# Patient Record
Sex: Female | Born: 1955 | Race: Black or African American | Hispanic: No | Marital: Single | Smoking: Former smoker
Health system: Southern US, Community
[De-identification: ages and names within clinical notes are randomized; demographics above are authoritative.]

## PROBLEM LIST (undated history)

## (undated) DIAGNOSIS — Z9889 Other specified postprocedural states: Secondary | ICD-10-CM

## (undated) DIAGNOSIS — E119 Type 2 diabetes mellitus without complications: Secondary | ICD-10-CM

## (undated) DIAGNOSIS — G709 Myoneural disorder, unspecified: Secondary | ICD-10-CM

## (undated) DIAGNOSIS — I10 Essential (primary) hypertension: Secondary | ICD-10-CM

## (undated) DIAGNOSIS — D131 Benign neoplasm of stomach: Secondary | ICD-10-CM

## (undated) DIAGNOSIS — H409 Unspecified glaucoma: Secondary | ICD-10-CM

## (undated) DIAGNOSIS — R5383 Other fatigue: Secondary | ICD-10-CM

## (undated) DIAGNOSIS — Z8489 Family history of other specified conditions: Secondary | ICD-10-CM

## (undated) DIAGNOSIS — R7303 Prediabetes: Secondary | ICD-10-CM

## (undated) DIAGNOSIS — M2550 Pain in unspecified joint: Secondary | ICD-10-CM

## (undated) DIAGNOSIS — T7840XA Allergy, unspecified, initial encounter: Secondary | ICD-10-CM

## (undated) DIAGNOSIS — R011 Cardiac murmur, unspecified: Secondary | ICD-10-CM

## (undated) DIAGNOSIS — M1990 Unspecified osteoarthritis, unspecified site: Secondary | ICD-10-CM

## (undated) DIAGNOSIS — K209 Esophagitis, unspecified: Secondary | ICD-10-CM

## (undated) DIAGNOSIS — K5900 Constipation, unspecified: Secondary | ICD-10-CM

## (undated) DIAGNOSIS — J189 Pneumonia, unspecified organism: Secondary | ICD-10-CM

## (undated) DIAGNOSIS — H269 Unspecified cataract: Secondary | ICD-10-CM

## (undated) DIAGNOSIS — M549 Dorsalgia, unspecified: Secondary | ICD-10-CM

## (undated) DIAGNOSIS — K760 Fatty (change of) liver, not elsewhere classified: Secondary | ICD-10-CM

## (undated) DIAGNOSIS — K219 Gastro-esophageal reflux disease without esophagitis: Secondary | ICD-10-CM

## (undated) DIAGNOSIS — M797 Fibromyalgia: Secondary | ICD-10-CM

## (undated) DIAGNOSIS — K829 Disease of gallbladder, unspecified: Secondary | ICD-10-CM

## (undated) DIAGNOSIS — E785 Hyperlipidemia, unspecified: Secondary | ICD-10-CM

## (undated) DIAGNOSIS — K589 Irritable bowel syndrome without diarrhea: Secondary | ICD-10-CM

## (undated) HISTORY — DX: Prediabetes: R73.03

## (undated) HISTORY — DX: Pain in unspecified joint: M25.50

## (undated) HISTORY — DX: Disease of gallbladder, unspecified: K82.9

## (undated) HISTORY — DX: Constipation, unspecified: K59.00

## (undated) HISTORY — PX: EYE SURGERY: SHX253

## (undated) HISTORY — DX: Unspecified osteoarthritis, unspecified site: M19.90

## (undated) HISTORY — DX: Type 2 diabetes mellitus without complications: E11.9

## (undated) HISTORY — DX: Other specified postprocedural states: Z98.890

## (undated) HISTORY — DX: Benign neoplasm of stomach: D13.1

## (undated) HISTORY — DX: Cardiac murmur, unspecified: R01.1

## (undated) HISTORY — DX: Hyperlipidemia, unspecified: E78.5

## (undated) HISTORY — PX: VAGINAL HYSTERECTOMY: SUR661

## (undated) HISTORY — DX: Essential (primary) hypertension: I10

## (undated) HISTORY — DX: Fatty (change of) liver, not elsewhere classified: K76.0

## (undated) HISTORY — DX: Esophagitis, unspecified: K20.9

## (undated) HISTORY — DX: Unspecified cataract: H26.9

## (undated) HISTORY — DX: Irritable bowel syndrome, unspecified: K58.9

## (undated) HISTORY — DX: Unspecified glaucoma: H40.9

## (undated) HISTORY — DX: Other fatigue: R53.83

## (undated) HISTORY — DX: Allergy, unspecified, initial encounter: T78.40XA

## (undated) HISTORY — PX: COLONOSCOPY: SHX174

## (undated) HISTORY — DX: Esophagitis, unspecified without bleeding: K20.90

## (undated) HISTORY — DX: Myoneural disorder, unspecified: G70.9

## (undated) HISTORY — PX: LAPAROSCOPY: SHX197

## (undated) HISTORY — DX: Dorsalgia, unspecified: M54.9

## (undated) HISTORY — DX: Fibromyalgia: M79.7

## (undated) HISTORY — PX: UPPER GASTROINTESTINAL ENDOSCOPY: SHX188

## (undated) HISTORY — DX: Gastro-esophageal reflux disease without esophagitis: K21.9

---

## 1998-07-11 ENCOUNTER — Other Ambulatory Visit: Admission: RE | Admit: 1998-07-11 | Discharge: 1998-07-11 | Payer: Self-pay | Admitting: *Deleted

## 2000-04-16 ENCOUNTER — Other Ambulatory Visit: Admission: RE | Admit: 2000-04-16 | Discharge: 2000-04-16 | Payer: Self-pay | Admitting: Obstetrics and Gynecology

## 2001-02-11 ENCOUNTER — Encounter: Admission: RE | Admit: 2001-02-11 | Discharge: 2001-02-11 | Payer: Self-pay | Admitting: Internal Medicine

## 2001-02-11 ENCOUNTER — Encounter: Payer: Self-pay | Admitting: Internal Medicine

## 2001-03-04 ENCOUNTER — Other Ambulatory Visit: Admission: RE | Admit: 2001-03-04 | Discharge: 2001-03-04 | Payer: Self-pay | Admitting: *Deleted

## 2002-05-25 ENCOUNTER — Other Ambulatory Visit: Admission: RE | Admit: 2002-05-25 | Discharge: 2002-05-25 | Payer: Self-pay | Admitting: *Deleted

## 2003-08-31 ENCOUNTER — Other Ambulatory Visit: Admission: RE | Admit: 2003-08-31 | Discharge: 2003-08-31 | Payer: Self-pay | Admitting: Obstetrics and Gynecology

## 2004-01-10 ENCOUNTER — Observation Stay (HOSPITAL_COMMUNITY): Admission: EM | Admit: 2004-01-10 | Discharge: 2004-01-12 | Payer: Self-pay | Admitting: Emergency Medicine

## 2004-07-13 ENCOUNTER — Emergency Department (HOSPITAL_COMMUNITY): Admission: EM | Admit: 2004-07-13 | Discharge: 2004-07-13 | Payer: Self-pay | Admitting: *Deleted

## 2004-09-18 ENCOUNTER — Other Ambulatory Visit: Admission: RE | Admit: 2004-09-18 | Discharge: 2004-09-18 | Payer: Self-pay | Admitting: Obstetrics and Gynecology

## 2006-12-08 ENCOUNTER — Ambulatory Visit: Payer: Self-pay

## 2007-01-11 ENCOUNTER — Emergency Department (HOSPITAL_COMMUNITY): Admission: EM | Admit: 2007-01-11 | Discharge: 2007-01-12 | Payer: Self-pay | Admitting: Emergency Medicine

## 2007-01-15 ENCOUNTER — Encounter: Admission: RE | Admit: 2007-01-15 | Discharge: 2007-01-15 | Payer: Self-pay | Admitting: Internal Medicine

## 2007-01-15 ENCOUNTER — Encounter (INDEPENDENT_AMBULATORY_CARE_PROVIDER_SITE_OTHER): Payer: Self-pay | Admitting: *Deleted

## 2007-01-16 ENCOUNTER — Emergency Department (HOSPITAL_COMMUNITY): Admission: EM | Admit: 2007-01-16 | Discharge: 2007-01-16 | Payer: Self-pay | Admitting: Emergency Medicine

## 2007-01-19 ENCOUNTER — Encounter: Payer: Self-pay | Admitting: Gastroenterology

## 2007-01-22 ENCOUNTER — Encounter (INDEPENDENT_AMBULATORY_CARE_PROVIDER_SITE_OTHER): Payer: Self-pay | Admitting: *Deleted

## 2007-01-22 ENCOUNTER — Encounter: Admission: RE | Admit: 2007-01-22 | Discharge: 2007-01-22 | Payer: Self-pay | Admitting: Gastroenterology

## 2007-02-06 ENCOUNTER — Ambulatory Visit (HOSPITAL_COMMUNITY): Admission: RE | Admit: 2007-02-06 | Discharge: 2007-02-06 | Payer: Self-pay | Admitting: Obstetrics and Gynecology

## 2007-02-06 ENCOUNTER — Encounter (INDEPENDENT_AMBULATORY_CARE_PROVIDER_SITE_OTHER): Payer: Self-pay | Admitting: Obstetrics and Gynecology

## 2007-02-27 ENCOUNTER — Encounter: Admission: RE | Admit: 2007-02-27 | Discharge: 2007-02-27 | Payer: Self-pay | Admitting: Gastroenterology

## 2007-02-27 ENCOUNTER — Encounter (INDEPENDENT_AMBULATORY_CARE_PROVIDER_SITE_OTHER): Payer: Self-pay | Admitting: *Deleted

## 2007-03-10 ENCOUNTER — Encounter (INDEPENDENT_AMBULATORY_CARE_PROVIDER_SITE_OTHER): Payer: Self-pay | Admitting: Gastroenterology

## 2007-03-10 ENCOUNTER — Encounter: Payer: Self-pay | Admitting: Gastroenterology

## 2007-03-29 ENCOUNTER — Emergency Department (HOSPITAL_COMMUNITY): Admission: EM | Admit: 2007-03-29 | Discharge: 2007-03-29 | Payer: Self-pay | Admitting: Emergency Medicine

## 2007-05-26 ENCOUNTER — Encounter: Payer: Self-pay | Admitting: Gastroenterology

## 2007-06-04 HISTORY — PX: CHOLECYSTECTOMY: SHX55

## 2007-06-12 ENCOUNTER — Emergency Department (HOSPITAL_COMMUNITY): Admission: EM | Admit: 2007-06-12 | Discharge: 2007-06-12 | Payer: Self-pay | Admitting: Emergency Medicine

## 2007-07-02 ENCOUNTER — Encounter (INDEPENDENT_AMBULATORY_CARE_PROVIDER_SITE_OTHER): Payer: Self-pay | Admitting: General Surgery

## 2007-07-02 ENCOUNTER — Encounter (INDEPENDENT_AMBULATORY_CARE_PROVIDER_SITE_OTHER): Payer: Self-pay | Admitting: *Deleted

## 2007-07-02 ENCOUNTER — Ambulatory Visit (HOSPITAL_COMMUNITY): Admission: RE | Admit: 2007-07-02 | Discharge: 2007-07-03 | Payer: Self-pay | Admitting: General Surgery

## 2007-08-07 ENCOUNTER — Encounter: Payer: Self-pay | Admitting: Gastroenterology

## 2007-08-20 ENCOUNTER — Encounter: Payer: Self-pay | Admitting: Gastroenterology

## 2008-05-25 ENCOUNTER — Encounter: Payer: Self-pay | Admitting: Internal Medicine

## 2008-05-25 ENCOUNTER — Ambulatory Visit: Payer: Self-pay | Admitting: Internal Medicine

## 2008-05-25 DIAGNOSIS — D131 Benign neoplasm of stomach: Secondary | ICD-10-CM | POA: Insufficient documentation

## 2008-05-25 DIAGNOSIS — K219 Gastro-esophageal reflux disease without esophagitis: Secondary | ICD-10-CM | POA: Insufficient documentation

## 2008-05-25 DIAGNOSIS — E1129 Type 2 diabetes mellitus with other diabetic kidney complication: Secondary | ICD-10-CM | POA: Insufficient documentation

## 2008-05-25 DIAGNOSIS — M5412 Radiculopathy, cervical region: Secondary | ICD-10-CM | POA: Insufficient documentation

## 2008-05-25 DIAGNOSIS — E119 Type 2 diabetes mellitus without complications: Secondary | ICD-10-CM

## 2008-05-25 DIAGNOSIS — R209 Unspecified disturbances of skin sensation: Secondary | ICD-10-CM | POA: Insufficient documentation

## 2008-05-25 DIAGNOSIS — E785 Hyperlipidemia, unspecified: Secondary | ICD-10-CM | POA: Insufficient documentation

## 2008-05-25 DIAGNOSIS — K589 Irritable bowel syndrome without diarrhea: Secondary | ICD-10-CM | POA: Insufficient documentation

## 2008-05-25 DIAGNOSIS — R809 Proteinuria, unspecified: Secondary | ICD-10-CM | POA: Insufficient documentation

## 2008-05-25 LAB — CONVERTED CEMR LAB
Cholesterol, target level: 200 mg/dL
HDL goal, serum: 40 mg/dL
LDL Goal: 100 mg/dL
Vitamin B-12: 1356 pg/mL — ABNORMAL HIGH (ref 211–911)

## 2008-05-31 ENCOUNTER — Encounter: Admission: RE | Admit: 2008-05-31 | Discharge: 2008-05-31 | Payer: Self-pay | Admitting: Internal Medicine

## 2008-06-06 ENCOUNTER — Encounter: Payer: Self-pay | Admitting: Internal Medicine

## 2008-08-09 ENCOUNTER — Ambulatory Visit: Payer: Self-pay | Admitting: Internal Medicine

## 2008-08-09 LAB — CONVERTED CEMR LAB
Anti Nuclear Antibody(ANA): NEGATIVE
Basophils Relative: 0.3 % (ref 0.0–3.0)
CO2: 32 meq/L (ref 19–32)
Chloride: 100 meq/L (ref 96–112)
Creatinine,U: 147.9 mg/dL
Crystals: NEGATIVE
Eosinophils Absolute: 0.1 10*3/uL (ref 0.0–0.7)
Eosinophils Relative: 2 % (ref 0.0–5.0)
HCT: 36.6 % (ref 36.0–46.0)
Hgb A1c MFr Bld: 6.1 % — ABNORMAL HIGH (ref 4.6–6.0)
Leukocytes, UA: NEGATIVE
Microalb Creat Ratio: 10.8 mg/g (ref 0.0–30.0)
Neutro Abs: 1.4 10*3/uL (ref 1.4–7.7)
Nitrite: NEGATIVE
Potassium: 3.5 meq/L (ref 3.5–5.1)
RBC: 4.26 M/uL (ref 3.87–5.11)
Sed Rate: 19 mm/hr (ref 0–22)
Sodium: 142 meq/L (ref 135–145)
TSH: 0.62 microintl units/mL (ref 0.35–5.50)
Total Bilirubin: 0.8 mg/dL (ref 0.3–1.2)
Urine Glucose: NEGATIVE mg/dL
pH: 6 (ref 5.0–8.0)

## 2008-08-10 ENCOUNTER — Encounter: Payer: Self-pay | Admitting: Internal Medicine

## 2008-10-25 ENCOUNTER — Ambulatory Visit: Payer: Self-pay | Admitting: Gastroenterology

## 2008-10-25 DIAGNOSIS — M797 Fibromyalgia: Secondary | ICD-10-CM | POA: Insufficient documentation

## 2008-10-25 DIAGNOSIS — K7581 Nonalcoholic steatohepatitis (NASH): Secondary | ICD-10-CM | POA: Insufficient documentation

## 2008-11-08 ENCOUNTER — Telehealth: Payer: Self-pay | Admitting: Internal Medicine

## 2008-11-08 LAB — HM COLONOSCOPY

## 2008-11-28 ENCOUNTER — Ambulatory Visit: Payer: Self-pay | Admitting: Gastroenterology

## 2008-11-28 DIAGNOSIS — R143 Flatulence: Secondary | ICD-10-CM

## 2008-11-28 DIAGNOSIS — R142 Eructation: Secondary | ICD-10-CM

## 2008-11-28 DIAGNOSIS — R141 Gas pain: Secondary | ICD-10-CM | POA: Insufficient documentation

## 2009-02-10 ENCOUNTER — Ambulatory Visit: Payer: Self-pay | Admitting: Internal Medicine

## 2009-02-10 DIAGNOSIS — K117 Disturbances of salivary secretion: Secondary | ICD-10-CM | POA: Insufficient documentation

## 2009-02-10 LAB — CONVERTED CEMR LAB
Basophils Absolute: 0 10*3/uL (ref 0.0–0.1)
Basophils Relative: 0.3 % (ref 0.0–3.0)
Calcium: 9.3 mg/dL (ref 8.4–10.5)
Eosinophils Absolute: 0.1 10*3/uL (ref 0.0–0.7)
Eosinophils Relative: 1.8 % (ref 0.0–5.0)
GFR calc non Af Amer: 96.06 mL/min (ref >60)
Lymphocytes Relative: 50.1 % — ABNORMAL HIGH (ref 12.0–46.0)
MCHC: 34.8 g/dL (ref 30.0–36.0)
MCV: 86.5 fL (ref 78.0–100.0)
Neutro Abs: 1.5 10*3/uL (ref 1.4–7.7)
Potassium: 3.7 meq/L (ref 3.5–5.1)
RDW: 14.7 % — ABNORMAL HIGH (ref 11.5–14.6)
Sed Rate: 27 mm/hr — ABNORMAL HIGH (ref 0–22)
Sodium: 143 meq/L (ref 135–145)
Specific Gravity, Urine: 1.02 (ref 1.000–1.030)
TSH: 0.79 microintl units/mL (ref 0.35–5.50)
Urine Glucose: NEGATIVE mg/dL
WBC: 4.3 10*3/uL — ABNORMAL LOW (ref 4.5–10.5)
pH: 6 (ref 5.0–8.0)

## 2009-03-23 ENCOUNTER — Encounter: Payer: Self-pay | Admitting: Internal Medicine

## 2009-03-23 ENCOUNTER — Ambulatory Visit: Payer: Self-pay | Admitting: Internal Medicine

## 2009-03-24 ENCOUNTER — Telehealth: Payer: Self-pay | Admitting: Internal Medicine

## 2009-06-14 ENCOUNTER — Telehealth: Payer: Self-pay | Admitting: Internal Medicine

## 2009-06-16 ENCOUNTER — Ambulatory Visit: Payer: Self-pay | Admitting: Internal Medicine

## 2009-06-16 ENCOUNTER — Telehealth: Payer: Self-pay | Admitting: Internal Medicine

## 2009-06-21 ENCOUNTER — Encounter: Payer: Self-pay | Admitting: Internal Medicine

## 2009-06-21 ENCOUNTER — Telehealth: Payer: Self-pay | Admitting: Internal Medicine

## 2009-08-02 ENCOUNTER — Telehealth: Payer: Self-pay | Admitting: Internal Medicine

## 2009-08-03 ENCOUNTER — Encounter: Payer: Self-pay | Admitting: Internal Medicine

## 2009-08-03 ENCOUNTER — Ambulatory Visit: Payer: Self-pay | Admitting: Internal Medicine

## 2009-08-03 DIAGNOSIS — R9431 Abnormal electrocardiogram [ECG] [EKG]: Secondary | ICD-10-CM | POA: Insufficient documentation

## 2009-08-03 DIAGNOSIS — M549 Dorsalgia, unspecified: Secondary | ICD-10-CM | POA: Insufficient documentation

## 2009-08-03 DIAGNOSIS — I10 Essential (primary) hypertension: Secondary | ICD-10-CM | POA: Insufficient documentation

## 2009-08-09 ENCOUNTER — Ambulatory Visit: Payer: Self-pay | Admitting: Internal Medicine

## 2009-08-09 DIAGNOSIS — IMO0002 Reserved for concepts with insufficient information to code with codable children: Secondary | ICD-10-CM | POA: Insufficient documentation

## 2009-08-14 ENCOUNTER — Telehealth (INDEPENDENT_AMBULATORY_CARE_PROVIDER_SITE_OTHER): Payer: Self-pay | Admitting: *Deleted

## 2009-08-15 ENCOUNTER — Encounter (HOSPITAL_COMMUNITY): Admission: RE | Admit: 2009-08-15 | Discharge: 2009-10-04 | Payer: Self-pay | Admitting: Internal Medicine

## 2009-08-15 ENCOUNTER — Ambulatory Visit: Payer: Self-pay

## 2009-08-15 ENCOUNTER — Ambulatory Visit: Payer: Self-pay | Admitting: Cardiology

## 2009-08-15 ENCOUNTER — Telehealth: Payer: Self-pay | Admitting: Internal Medicine

## 2009-08-23 ENCOUNTER — Ambulatory Visit (HOSPITAL_COMMUNITY): Admission: RE | Admit: 2009-08-23 | Discharge: 2009-08-23 | Payer: Self-pay | Admitting: Internal Medicine

## 2009-08-24 ENCOUNTER — Telehealth: Payer: Self-pay | Admitting: Internal Medicine

## 2009-08-24 ENCOUNTER — Encounter: Payer: Self-pay | Admitting: Internal Medicine

## 2009-10-11 ENCOUNTER — Encounter
Admission: RE | Admit: 2009-10-11 | Discharge: 2010-01-03 | Payer: Self-pay | Admitting: Physical Medicine & Rehabilitation

## 2009-10-11 ENCOUNTER — Ambulatory Visit: Payer: Self-pay | Admitting: Internal Medicine

## 2009-10-11 LAB — CONVERTED CEMR LAB
BUN: 15 mg/dL (ref 6–23)
CO2: 33 meq/L — ABNORMAL HIGH (ref 19–32)
Calcium: 9.7 mg/dL (ref 8.4–10.5)
Chloride: 102 meq/L (ref 96–112)
Glucose, Bld: 122 mg/dL — ABNORMAL HIGH (ref 70–99)

## 2009-10-13 ENCOUNTER — Ambulatory Visit: Payer: Self-pay | Admitting: Physical Medicine & Rehabilitation

## 2009-10-16 ENCOUNTER — Ambulatory Visit: Payer: Self-pay | Admitting: Physical Medicine & Rehabilitation

## 2009-10-27 ENCOUNTER — Ambulatory Visit: Payer: Self-pay | Admitting: Physical Medicine & Rehabilitation

## 2009-11-30 ENCOUNTER — Ambulatory Visit: Payer: Self-pay | Admitting: Physical Medicine & Rehabilitation

## 2009-12-28 ENCOUNTER — Ambulatory Visit: Payer: Self-pay | Admitting: Physical Medicine & Rehabilitation

## 2009-12-29 ENCOUNTER — Ambulatory Visit (HOSPITAL_COMMUNITY)
Admission: RE | Admit: 2009-12-29 | Discharge: 2009-12-29 | Payer: Self-pay | Admitting: Physical Medicine & Rehabilitation

## 2010-01-03 ENCOUNTER — Encounter
Admission: RE | Admit: 2010-01-03 | Discharge: 2010-04-03 | Payer: Self-pay | Admitting: Physical Medicine & Rehabilitation

## 2010-02-19 ENCOUNTER — Ambulatory Visit: Payer: Self-pay | Admitting: Physical Medicine & Rehabilitation

## 2010-05-11 ENCOUNTER — Encounter
Admission: RE | Admit: 2010-05-11 | Discharge: 2010-05-11 | Payer: Self-pay | Source: Home / Self Care | Attending: Emergency Medicine | Admitting: Emergency Medicine

## 2010-05-15 ENCOUNTER — Encounter
Admission: RE | Admit: 2010-05-15 | Discharge: 2010-07-03 | Payer: Self-pay | Source: Home / Self Care | Attending: Physical Medicine & Rehabilitation | Admitting: Physical Medicine & Rehabilitation

## 2010-05-21 ENCOUNTER — Ambulatory Visit: Payer: Self-pay | Admitting: Physical Medicine & Rehabilitation

## 2010-07-03 NOTE — Assessment & Plan Note (Signed)
Summary: Cardiology Nuclear Study  Nuclear Med Background Indications for Stress Test: Evaluation for Ischemia, Abnormal EKG   History: Myocardial Perfusion Study  History Comments: 07/06 JYN:WGNFAO, EF=70%  Symptoms: Chest Tightness, Diaphoresis, Dizziness, DOE, Fatigue, Near Syncope, Palpitations  Symptoms Comments: Last episode of ZH:YQMV night.   Nuclear Pre-Procedure Cardiac Risk Factors: Family History - CAD, History of Smoking, Lipids, NIDDM, Obesity Caffeine/Decaff Intake: None NPO After: 8:00 PM Lungs: Clear.  O2 Sat 99% on RA. IV 0.9% NS with Angio Cath: 22g     IV Site: (R) AC IV Started by: Irean Hong RN Chest Size (in) 40     Cup Size C     Height (in): 68 Weight (lb): 234 BMI: 35.71  Nuclear Med Study 1 or 2 day study:  1 day     Stress Test Type:  Eugenie Birks Reading MD:  Marca Ancona, MD     Referring MD:  Sanda Linger, MD Resting Radionuclide:  Technetium 64m Tetrofosmin     Resting Radionuclide Dose:  11.0 mCi  Stress Radionuclide:  Technetium 65m Tetrofosmin     Stress Radionuclide Dose:  33.0 mCi   Stress Protocol   Lexiscan: 0.4 mg   Stress Test Technologist:  Rea College CMA-N     Nuclear Technologist:  Domenic Polite CNMT  Rest Procedure  Myocardial perfusion imaging was performed at rest 45 minutes following the intravenous administration of Myoview Technetium 78m Tetrofosmin.  Stress Procedure  The patient received IV Lexiscan 0.4 mg over 15-seconds.  Myoview injected at 30-seconds.  There were diffuse nonspecific T-wave changes with lexiscan.  Quantitative spect images were obtained after a 45 minute delay.  QPS Raw Data Images:  Normal; no motion artifact; normal heart/lung ratio. Stress Images:  Small, mild apical anterior perfusion defect.  Rest Images:  Normal homogeneous uptake in all areas of the myocardium. Subtraction (SDS):  Small reversible apical anterior defect.  Transient Ischemic Dilatation:  .90  (Normal <1.22)  Lung/Heart  Ratio:  .27  (Normal <0.45)  Quantitative Gated Spect Images QGS EDV:  71 ml QGS ESV:  22 ml QGS EF:  69 % QGS cine images:  Normal wall motion.    Overall Impression  Exercise Capacity: Lexiscan study BP Response: Normal blood pressure response. Clinical Symptoms: Shortness of breath ECG Impression: T wave inversions with Lexiscan infusion.  Overall Impression: Small reversible apical anterior perfusion defect.  This could represent shifting breast artifact but cannot rule out a small area of ischemia.  Overall Impression Comments: Overall low risk study.

## 2010-07-03 NOTE — Progress Notes (Signed)
Summary: [facility] PA  Phone Note From Pharmacy   Caller: [facility]  [address] [phone]* Call For: [phone] ID:  [phone]  Summary of Call: PA request--[facility]. Called [employer] to inquire and was told to call [employer] [place]. Form requested, and alternative list printed for MD. Initial call taken by: Lucious Groves,  June 16, 2009 9:37 AM  Follow-up for Phone Call        done Follow-up by: Etta Grandchild MD,  June 18, 2009 5:30 PM

## 2010-07-03 NOTE — Assessment & Plan Note (Signed)
Summary: PER KELLY SCHED--STC   Vital Signs:  Patient profile:   55 year old female Height:      68 inches Weight:      241 pounds BMI:     36.78 O2 Sat:      96 % on Room air Temp:     98.8 degrees F oral Pulse rhythm:   regular Resp:     16 per minute BP sitting:   128 / 84 Cuff size:   large  Vitals Entered By: Rock Nephew CMA (August 03, 2009 10:22 AM)  Nutrition Counseling: Patient's BMI is greater than 25 and therefore counseled on weight management options.  O2 Flow:  Room air CC: Pt c/o pain in both legs from hips down. Pt states the pain is constant and medication isn't helping. Pt also states there is some swelling./aj, Hypertension Management, Back Pain, Lipid Management   Primary Care Provider:  Sanda Linger, MD  CC:  Pt c/o pain in both legs from hips down. Pt states the pain is constant and medication isn't helping. Pt also states there is some swelling./aj, Hypertension Management, Back Pain, and Lipid Management.  History of Present Illness: She returns c/o persistent LBP that goes into both legs R>L. The pain on the right side goes down to her knee. She has stopped Crestor and symptoms persist. Recent meds have not been helpful.  Also, she complains of edema and SOB since she stopped hctz.  Back Pain History:      The patient's back pain started approximately 03/16/2009.  The pain is located in the lower back region and does radiate below the knees.  She states this is not work related.  On a scale of 1-10, she describes the pain as a 3.  She states that she has no prior history of back pain.  The patient has not had any recent physical therapy for her back pain.  The following makes the back pain better: rest.  The following makes the back pain worse: movement, bending, .        Description of injury in patient's own words:  n/a.    Critical Exclusionary Diagnosis Criteria (CEDC) for Back Pain:      The patient denies a history of previous trauma.  She has no  prior history of spinal surgery.  There are no symptoms to suggest infection, cauda equina, or psychosocial factors for back pain.  Cancer risk factors include age >50 yrs with new back pain and no improvement in low back pain after 4-6 weeks therapy.  Other positive CEDC factors include low back pain worse with activity.    Hypertension History:      She complains of dyspnea with exertion and peripheral edema, but denies headache, chest pain, palpitations, orthopnea, PND, visual symptoms, neurologic problems, and syncope.        Positive major cardiovascular risk factors include diabetes, hyperlipidemia, hypertension, and family history for ischemic heart disease (males less than 31 years old).  Negative major cardiovascular risk factors include female age less than 28 years old and non-tobacco-user status.        Further assessment for target organ damage reveals no history of ASHD, cardiac end-organ damage (CHF/LVH), stroke/TIA, peripheral vascular disease, renal insufficiency, or hypertensive retinopathy.    Lipid Management History:      Positive NCEP/ATP III risk factors include diabetes, family history for ischemic heart disease (males less than 54 years old), and hypertension.  Negative NCEP/ATP III risk factors include female  age less than 82 years old, no history of early menopause without estrogen hormone replacement, non-tobacco-user status, no ASHD (atherosclerotic heart disease), no prior stroke/TIA, no peripheral vascular disease, and no history of aortic aneurysm.        The patient states that she knows about the "Therapeutic Lifestyle Change" diet.  Her compliance with the TLC diet is fair.  The patient expresses understanding of adjunctive measures for cholesterol lowering.  Adjunctive measures started by the patient include fiber and limit alcohol consumpton.  She expresses no side effects from her lipid-lowering medication.  The patient denies any symptoms to suggest myopathy or liver  disease.      Current Medications (verified): 1)  Crestor 20 Mg Tabs (Rosuvastatin Calcium) .... Take 1/2 Tablet By Mouth Once Daily 2)  Multivitamins  Tabs (Multiple Vitamin) .... Take 1 Tablet By Mouth Once A Day 3)  Vitamin E 1000 Unit Caps (Vitamin E) .... Take 1 Capsule By Mouth Once Daily 4)  Levbid 0.375 Mg Xr12h-Tab (Hyoscyamine Sulfate) .... One Tablet By Mouth Two Times A Day 5)  Robaxin 500 Mg Tabs (Methocarbamol) .Marland Kitchen.. 1 By Mouth Three Times A Day As Needed For Muscle Spasms 6)  Naprosyn 375 Mg Tabs (Naproxen) .... One By Mouth Two Times A Day For Pain 7)  Prilosec 40 Mg Cpdr (Omeprazole) .... One By Mouth Once Daily  Allergies (verified): No Known Drug Allergies  Past History:  Past Medical History: Reviewed history from 10/25/2008 and no changes required. GERD Hyperlipidemia Hyperthyroidism Benign fundic gland polyps IBS Arthritis Fibromyalgia Fatty liver  Past Surgical History: Reviewed history from 11/28/2008 and no changes required. Cholecystectomy 2009 (low GB EF, no gallstones) Hysterectomy Laparascopy for adhesions  Family History: Reviewed history from 11/28/2008 and no changes required. Family History of Colon CA 1st degree relative <60 Father Family History Hypertension Family History of Heart Disease: Mother Family History of Diabetes: Half Brother  Social History: Reviewed history from 11/28/2008 and no changes required. 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 -stopped 28 years ago Daily Caffeine Use-none  Review of Systems       The patient complains of weight gain, dyspnea on exertion, and peripheral edema.  The patient denies anorexia, fever, weight loss, chest pain, syncope, prolonged cough, headaches, hemoptysis, abdominal pain, melena, hematochezia, severe indigestion/heartburn, hematuria, incontinence, muscle weakness, suspicious skin lesions,  depression, abnormal bleeding, enlarged lymph nodes, and angioedema.    Physical Exam  General:  Well developed, well nourished, no acute distress.overweight-appearing.   Head:  normocephalic and atraumatic.   Mouth:  Oral mucosa and oropharynx without lesions or exudates.  Teeth in good repair. oropharynx pink and moist, no lesions, no aphthous ulcers, no erosions, and no tongue abnormalities.   Neck:  No deformities, masses, or tenderness noted. Lungs:  Normal respiratory effort, chest expands symmetrically. Lungs are clear to auscultation, no crackles or wheezes. Heart:  Normal rate and regular rhythm. S1 and S2 normal without gallop, murmur, click, rub or other extra sounds. Abdomen:  Bowel sounds positive,abdomen soft and non-tender without masses, organomegaly or hernias noted. Msk:  normal ROM, no joint tenderness, no joint swelling, no joint warmth, no redness over joints, no joint deformities, no joint instability, and no crepitation.   Pulses:  R and L carotid,radial,femoral,dorsalis pedis and posterior tibial pulses are full and equal bilaterally Extremities:  1+ left pedal edema and 1+ right pedal edema.   Neurologic:  No cranial nerve  deficits noted. Station and gait are normal. Plantar reflexes are down-going bilaterally. DTRs are symmetrical throughout. Sensory, motor and coordinative functions appear intact. Skin:  Intact without suspicious lesions or rashes Cervical Nodes:  no anterior cervical adenopathy and no posterior cervical adenopathy.   Axillary Nodes:  no R axillary adenopathy and no L axillary adenopathy.   Psych:  Cognition and judgment appear intact. Alert and cooperative with normal attention span and concentration. No apparent delusions, illusions, hallucinations Additional Exam:  EKG shows NSR with flat T wave in V1 and I but no Q waves and no acute ST/T wave changes.  Low Back Pain Physical Exam:    Inspection-deformity:     No    Palpation-spinal tenderness:    No    Motor Exam/Strength:         Left Ankle Dorsiflexion (L5,L4):     normal       Left Great Toe Dorsiflexion (L5,L4):     normal       Left Heel Walk (L5,some L4):     normal       Left Single Squat & Rise-Quads (L4):   normal       Left Toe Walk-calf (S1):       normal       Right Ankle Dorsiflexion (L5,L4):     normal       Right Great Toe Dorsiflexion (L5,L4):       normal       Right Heel Walk (L5,some L4):     normal       Right Single Squat & Rise Quads (L4):   normal       Right Toe Walk-calf (S1):       normal    Sensory Exam/Pinprick:        Left Medial Foot (L4):   normal       Left Dorsal Foot (L5):   normal       Left Lateral Foot (S1):   normal       Right Medial Foot (L4):   normal       Right Dorsal Foot (L5):   normal       Right Lateral Foot (S1):   normal    Reflexes:        Left Knee Jerk (L4):     normal       Left Ankle Reflex (S1):   normal       Right Knee Jerk:     normal       Right Ankle Reflex (S1):   normal    Straight Leg Raise (SLR):       Left Straight Leg Raise (SLR):   negative       Right Straight Leg Raise (SLR):   negative  Diabetes Management Exam:    Foot Exam (with socks and/or shoes not present):       Sensory-Pinprick/Light touch:          Left medial foot (L-4): normal          Left dorsal foot (L-5): normal          Left lateral foot (S-1): normal          Right medial foot (L-4): normal          Right dorsal foot (L-5): normal          Right lateral foot (S-1): normal       Sensory-Monofilament:          Left foot:  normal          Right foot: normal       Inspection:          Left foot: normal          Right foot: normal       Nails:          Left foot: normal          Right foot: normal   Impression & Recommendations:  Problem # 1:  BACK PAIN (ICD-724.5) Assessment New will start with plain films and consider MRi since she does have a radicular pain. change meds as well. The following medications were removed from  the medication list:    Robaxin 500 Mg Tabs (Methocarbamol) .Marland Kitchen... 1 by mouth three times a day as needed for muscle spasms    Naprosyn 375 Mg Tabs (Naproxen) ..... One by mouth two times a day for pain Her updated medication list for this problem includes:    Nucynta 75 Mg Tabs (Tapentadol hcl) ..... One by mouth qid as needed for pain  Orders: T-Lumbar Spine Complete, 5 Views ([id number])  Problem # 2:  HYPERTENSION, BENIGN ESSENTIAL (ICD-401.1) Assessment: New  Her updated medication list for this problem includes:    Hydrochlorothiazide 25 Mg Tabs (Hydrochlorothiazide) ..... One by mouth qam  BP today: 128/84 Prior BP: 120/82 (06/16/2009)  Prior 10 Yr Risk Heart Disease: Not enough information (05/25/2008)  Labs Reviewed: K+: 3.7 (02/10/2009) Creat: : 0.8 (02/10/2009)     Problem # 3:  FIBROMYALGIA (ICD-729.1) Assessment: Deteriorated  The following medications were removed from the medication list:    Robaxin 500 Mg Tabs (Methocarbamol) .Marland Kitchen... 1 by mouth three times a day as needed for muscle spasms    Naprosyn 375 Mg Tabs (Naproxen) ..... One by mouth two times a day for pain Her updated medication list for this problem includes:    Nucynta 75 Mg Tabs (Tapentadol hcl) ..... One by mouth qid as needed for pain  Problem # 4:  HYPERLIPIDEMIA (ICD-272.4) Assessment: Deteriorated  The following medications were removed from the medication list:    Crestor 20 Mg Tabs (Rosuvastatin calcium) .Marland Kitchen... Take 1/2 tablet by mouth once daily  Labs Reviewed: SGOT: 16 (08/09/2008)   SGPT: 30 (08/09/2008)  Lipid Goals: Chol Goal: 200 (05/25/2008)   HDL Goal: 40 (05/25/2008)   LDL Goal: 100 (05/25/2008)   TG Goal: 150 (05/25/2008)  Prior 10 Yr Risk Heart Disease: Not enough information (05/25/2008)  Problem # 5:  DIABETES MELLITUS, TYPE II (ICD-250.00) Assessment: Unchanged  Labs Reviewed: Creat: 0.8 (02/10/2009)    Reviewed HgBA1c results: 6.1 (08/09/2008)  Problem # 6:   ELECTROCARDIOGRAM, ABNORMAL (ICD-794.31) Assessment: New in light of Fam. Hx. will order a cardiolite Orders: EKG w/ Interpretation (93000) Cardiolite (Cardiolite)  Complete Medication List: 1)  Multivitamins Tabs (Multiple vitamin) .... Take 1 tablet by mouth once a day 2)  Vitamin E 1000 Unit Caps (Vitamin e) .... Take 1 capsule by mouth once daily 3)  Levbid 0.375 Mg Xr12h-tab (Hyoscyamine sulfate) .... One tablet by mouth two times a day 4)  Prilosec 40 Mg Cpdr (Omeprazole) .... One by mouth once daily 5)  Nucynta 75 Mg Tabs (Tapentadol hcl) .... One by mouth qid as needed for pain 6)  Hydrochlorothiazide 25 Mg Tabs (Hydrochlorothiazide) .... One by mouth qam  Hypertension Assessment/Plan:      The patient's hypertensive risk group is category C: Target organ damage and/or diabetes.  Today's blood pressure is 128/84.  Her blood pressure goal is < 130/80.  Lipid Assessment/Plan:      Based on NCEP/ATP III, the patient's risk factor category is "history of diabetes".  The patient's lipid goals are as follows: Total cholesterol goal is 200; LDL cholesterol goal is 100; HDL cholesterol goal is 40; Triglyceride goal is 150.     Patient Instructions: 1)  Please schedule a follow-up appointment in 1 month. 2)  It is important that you exercise regularly at least 20 minutes 5 times a week. If you develop chest pain, have severe difficulty breathing, or feel very tired , stop exercising immediately and seek medical attention. 3)  You need to lose weight. Consider a lower calorie diet and regular exercise.  4)  Check your Blood Pressure regularly. If it is above 140/90: you should make an appointment. 5)  Take 650-1000mg  of Tylenol every 4-6 hours as needed for relief of pain or comfort of fever AVOID taking more than 4000mg   in a 24 hour period (can cause liver damage in higher doses). 6)  Most patients (90%) with low back pain will improve with time (2-6 weeks). Keep active but avoid  activities that are painful. Apply moist heat and/or ice to lower back several times a day. Prescriptions: HYDROCHLOROTHIAZIDE 25 MG TABS (HYDROCHLOROTHIAZIDE) One by mouth qam  #30 x 11   Entered and Authorized by:   Etta Grandchild MD   Signed by:   Etta Grandchild MD on 08/03/2009   Method used:   Print then Give to Patient   RxID:   [id number] NUCYNTA 75 MG TABS (TAPENTADOL HCL) One by mouth QID as needed for pain  #120 x 0   Entered and Authorized by:   Etta Grandchild MD   Signed by:   Etta Grandchild MD on 08/03/2009   Method used:   Print then Give to Patient   RxID:   [id number]

## 2010-07-03 NOTE — Medication Information (Signed)
Summary: Denial [facility] [facility]  [facility]ial [facility] [facility]   Imported By: Lester [place] 06/28/2009 16:32:03  _____________________________________________________________________  External Attachment:    Type:   Image     Comment:   External Document

## 2010-07-03 NOTE — Letter (Signed)
Summary: Results Follow-up Letter  [facility]  [address] [place], [place] [address]   Phone: [phone]  Fax: [phone]    08/24/2009  [address] [place], [place]  [address]  Dear [name],   The following are the results of your recent test(s):  Test     Result     MRI of L-spine   bulging discs, nerve damage, arthritis  _________________________________________________________  Please call for an appointment as directed _________________________________________________________ _________________________________________________________ _________________________________________________________  Sincerely,  Sanda Linger MD [facility]

## 2010-07-03 NOTE — Progress Notes (Signed)
Summary: med not workin  Phone Note Call from Patient Call back at [phone]   Summary of Call: Patient called stating that the meds given on 06/16/09 are not helping. Patient is still having pain, arthritis, and swelling in her legs. Some days are better than others, but it continues to be a recurring issue. Please advise. Initial call taken by: Lucious Groves,  August 02, 2009 2:06 PM  Follow-up for Phone Call        needs to be seen Follow-up by: Etta Grandchild MD,  August 02, 2009 2:15 PM  Additional Follow-up for Phone Call Additional follow up Details #1::        Patient notified. Additional Follow-up by: Lucious Groves,  August 02, 2009 3:55 PM

## 2010-07-03 NOTE — Assessment & Plan Note (Signed)
Summary: 2 MTH FU STC   Vital Signs:  Patient profile:   55 year old female Height:      68 inches Weight:      224.50 pounds BMI:     34.26 O2 Sat:      98 % Temp:     98.0 degrees F oral Pulse rate:   79 / minute Pulse rhythm:   regular Resp:     16 per minute BP sitting:   118 / 82  (left arm) Cuff size:   large  Vitals Entered By: Rock Nephew CMA (Oct 11, 2009 8:15 AM)  Nutrition Counseling: Patient's BMI is greater than 25 and therefore counseled on weight management options.  Primary Care Provider:  Sanda Linger, MD   History of Present Illness: She returns for f/up and has persistent LBP that radiates into her RLE. It has not recently changed or worsened and continues to affect her activity level. She sees pain management this Friday.  Back Pain History:      The patient's back pain has been present for > 6 weeks.  The pain is located in the lower back region and does radiate below the knees.  She states this is not work related.  On a scale of 1-10, she describes the pain as a 6.  She states that she has had a prior history of back pain.  The patient has not had any recent physical therapy for her back pain.  The following makes the back pain better: movement, bending.  The following makes the back pain worse: rest, meds.    Critical Exclusionary Diagnosis Criteria (CEDC) for Back Pain:      The patient denies a history of previous trauma.  She has no prior history of spinal surgery.  There are no symptoms to suggest infection, cancer, cauda equina, or psychosocial factors for back pain.     Current Medications (verified): 1)  Multivitamins  Tabs (Multiple Vitamin) .... Take 1 Tablet By Mouth Once A Day 2)  Vitamin E 1000 Unit Caps (Vitamin E) .... Take 1 Capsule By Mouth Once Daily 3)  Levbid 0.375 Mg Xr12h-Tab (Hyoscyamine Sulfate) .... One Tablet By Mouth Two Times A Day 4)  Prilosec 40 Mg Cpdr (Omeprazole) .... One By Mouth Once Daily 5)  Hydrochlorothiazide 25  Mg Tabs (Hydrochlorothiazide) .... One By Mouth Qam 6)  Lortab 7.5-500 Mg Tabs (Hydrocodone-Acetaminophen) .... One By Mouth Qid As Needed For Pain 7)  Doxycycline .... X 7 Days  Allergies (verified): No Known Drug Allergies  Past History:  Past Medical History: Reviewed history from 10/25/2008 and no changes required. GERD Hyperlipidemia Hyperthyroidism Benign fundic gland polyps IBS Arthritis Fibromyalgia Fatty liver  Past Surgical History: Reviewed history from 11/28/2008 and no changes required. Cholecystectomy 2009 (low GB EF, no gallstones) Hysterectomy Laparascopy for adhesions  Family History: Reviewed history from 11/28/2008 and no changes required. Family History of Colon CA 1st degree relative <60 Father Family History Hypertension Family History of Heart Disease: Mother Family History of Diabetes: Half Brother  Social History: Reviewed history from 11/28/2008 and no changes required. 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 -stopped 28 years ago Daily Caffeine Use-none  Review of Systems  The patient denies anorexia, fever, weight loss, weight gain, chest pain, syncope, dyspnea on exertion, peripheral edema, prolonged cough, abdominal pain, and hematuria.   Endo:  Denies cold intolerance, excessive hunger, excessive thirst, excessive urination, heat  intolerance, polyuria, and weight change.  Physical Exam  General:  Well developed, well nourished, no acute distress.overweight-appearing.   Mouth:  Oral mucosa and oropharynx without lesions or exudates.  Teeth in good repair. oropharynx pink and moist, no lesions, no aphthous ulcers, no erosions, and no tongue abnormalities.   Neck:  No deformities, masses, or tenderness noted. Lungs:  Normal respiratory effort, chest expands symmetrically. Lungs are clear to auscultation, no crackles or wheezes. Heart:  Normal rate  and regular rhythm. S1 and S2 normal without gallop, murmur, click, rub or other extra sounds. Abdomen:  Bowel sounds positive,abdomen soft and non-tender without masses, organomegaly or hernias noted. Msk:  normal ROM, no joint tenderness, no joint swelling, no joint warmth, no redness over joints, no joint deformities, no joint instability, and no crepitation.   Pulses:  R and L carotid,radial,femoral,dorsalis pedis and posterior tibial pulses are full and equal bilaterally Extremities:  1+ left pedal edema and 1+ right pedal edema.   Neurologic:  No cranial nerve deficits noted. Station and gait are normal. Plantar reflexes are down-going bilaterally. DTRs are symmetrical throughout. Sensory, motor and coordinative functions appear intact. Skin:  Intact without suspicious lesions or rashes Cervical Nodes:  no anterior cervical adenopathy and no posterior cervical adenopathy.   Psych:  Cognition and judgment appear intact. Alert and cooperative with normal attention span and concentration. No apparent delusions, illusions, hallucinations  Low Back Pain Physical Exam:    Inspection-deformity:     No    Palpation-spinal tenderness:   No    Motor Exam/Strength:         Left Ankle Dorsiflexion (L5,L4):     normal       Left Great Toe Dorsiflexion (L5,L4):     normal       Left Heel Walk (L5,some L4):     normal       Left Single Squat & Rise-Quads (L4):   normal       Left Toe Walk-calf (S1):       normal       Right Ankle Dorsiflexion (L5,L4):     normal       Right Great Toe Dorsiflexion (L5,L4):       normal       Right Heel Walk (L5,some L4):     normal       Right Single Squat & Rise Quads (L4):   normal       Right Toe Walk-calf (S1):       normal    Reflexes:        Left Knee Jerk (L4):     normal       Left Ankle Reflex (S1):   normal       Right Knee Jerk:     normal       Right Ankle Reflex (S1):   normal    Straight Leg Raise (SLR):       Left Straight Leg Raise (SLR):    negative       Right Straight Leg Raise (SLR):   negative  Diabetes Management Exam:    Foot Exam (with socks and/or shoes not present):       Sensory-Pinprick/Light touch:          Left medial foot (L-4): normal          Left dorsal foot (L-5): normal          Left lateral foot (S-1): normal  Right medial foot (L-4): normal          Right dorsal foot (L-5): normal          Right lateral foot (S-1): normal       Sensory-Monofilament:          Left foot: normal          Right foot: normal       Inspection:          Left foot: normal          Right foot: normal       Nails:          Left foot: normal          Right foot: normal   Impression & Recommendations:  Problem # 1:  BACK PAIN, LUMBAR, WITH RADICULOPATHY (ICD-724.4) Assessment Unchanged  Her updated medication list for this problem includes:    Lortab 7.5-500 Mg Tabs (Hydrocodone-acetaminophen) ..... One by mouth qid as needed for pain  Problem # 2:  HYPERTENSION, BENIGN ESSENTIAL (ICD-401.1) Assessment: Improved  Her updated medication list for this problem includes:    Hydrochlorothiazide 25 Mg Tabs (Hydrochlorothiazide) ..... One by mouth qam  Orders: Venipuncture (87564) TLB-BMP (Basic Metabolic Panel-BMET) (80048-METABOL) TLB-A1C / Hgb A1C (Glycohemoglobin) (83036-A1C)  BP today: 118/82 Prior BP: 126/90 (08/09/2009)  Prior 10 Yr Risk Heart Disease: Not enough information (05/25/2008)  Labs Reviewed: K+: 3.7 (02/10/2009) Creat: : 0.8 (02/10/2009)     Problem # 3:  DIABETES MELLITUS, TYPE II (ICD-250.00) Assessment: Unchanged  Orders: Venipuncture (33295) TLB-BMP (Basic Metabolic Panel-BMET) (80048-METABOL) TLB-A1C / Hgb A1C (Glycohemoglobin) (18841-Y6A) Ophthalmology Referral (Ophthalmology)  Labs Reviewed: Creat: 0.8 (02/10/2009)    Reviewed HgBA1c results: 6.1 (08/09/2008)  Complete Medication List: 1)  Multivitamins Tabs (Multiple vitamin) .... Take 1 tablet by mouth once a day 2)   Vitamin E 1000 Unit Caps (Vitamin e) .... Take 1 capsule by mouth once daily 3)  Levbid 0.375 Mg Xr12h-tab (Hyoscyamine sulfate) .... One tablet by mouth two times a day 4)  Prilosec 40 Mg Cpdr (Omeprazole) .... One by mouth once daily 5)  Hydrochlorothiazide 25 Mg Tabs (Hydrochlorothiazide) .... One by mouth qam 6)  Lortab 7.5-500 Mg Tabs (Hydrocodone-acetaminophen) .... One by mouth qid as needed for pain 7)  Doxycycline  .... X 7 days  Patient Instructions: 1)  Please schedule a follow-up appointment in 2 months. 2)  It is important that you exercise regularly at least 20 minutes 5 times a week. If you develop chest pain, have severe difficulty breathing, or feel very tired , stop exercising immediately and seek medical attention. 3)  You need to lose weight. Consider a lower calorie diet and regular exercise.  4)  Check your blood sugars regularly. If your readings are usually above 200 or below 70 you should contact our office. 5)  It is important that your Diabetic A1c level is checked every 3 months. 6)  See your eye doctor yearly to check for diabetic eye damage. 7)  Check your feet each night for sore areas, calluses or signs of infection. 8)  Check your Blood Pressure regularly. If it is above 130/80: you should make an appointment.  Preventive Care Screening  Pap Smear:    Date:  09/01/2008    Results:  normal

## 2010-07-03 NOTE — Letter (Signed)
Summary: Results Follow-up Letter  [facility]  [address] [place], [place] [address]   Phone: [phone]  Fax: [phone]    08/03/2009  [address] [place], [place]  [address]  Dear [name],   The following are the results of your recent test(s):  Test     Result     Low back Xray   arthritis and a "slipped disc"   _________________________________________________________  Please call for an appointment soon, we should discuss doing an MRI _________________________________________________________ _________________________________________________________ _________________________________________________________  Sincerely,  Sanda Linger MD [facility]

## 2010-07-03 NOTE — Progress Notes (Signed)
Summary: RESULTS  Phone Note Call from Patient   Summary of Call: Patient is requesting to be called with results of stress test when ready.  Initial call taken by: Lamar Sprinkles, CMA,  August 15, 2009 3:22 PM  Follow-up for Phone Call        normal Follow-up by: Etta Grandchild MD,  August 16, 2009 7:52 AM  Additional Follow-up for Phone Call Additional follow up Details #1::        Pt informed  Additional Follow-up by: Lamar Sprinkles, CMA,  August 17, 2009 9:31 AM

## 2010-07-03 NOTE — Progress Notes (Signed)
Summary: more infor req  Phone Note Other Incoming   Summary of Call: Rec'd note from PA dept notifying us that they need more information to process vimovo prior authorization. Please fax additional info to [phone]. I faxed last couple of office notes and x ray of patient knee. Will wait for reply. Initial call taken by: Lucious Groves,  June 21, 2009 9:39 AM  Follow-up for Phone Call        Letter received, and Vimovo has been denied. Any recommendations? Follow-up by: Lucious Groves,  June 22, 2009 1:48 PM  Additional Follow-up for Phone Call Additional follow up Details #1::        change to prilosec and naprosyn Additional Follow-up by: Etta Grandchild MD,  June 22, 2009 2:02 PM    New/Updated Medications: NAPROSYN 375 MG TABS (NAPROXEN) One by mouth two times a day for pain PRILOSEC 40 MG CPDR (OMEPRAZOLE) One by mouth once daily Prescriptions: PRILOSEC 40 MG CPDR (OMEPRAZOLE) One by mouth once daily  #30 x 11   Entered and Authorized by:   Etta Grandchild MD   Signed by:   Etta Grandchild MD on 06/22/2009   Method used:   Electronically to        [facility]  [address] [phone]* (retail)       [address]       [place], [place]  [id number]       Ph: [phone] or [phone]       Fax: [phone]   RxID:   [id number] NAPROSYN 375 MG TABS (NAPROXEN) One by mouth two times a day for pain  #60 x 2   Entered and Authorized by:   Etta Grandchild MD   Signed by:   Etta Grandchild MD on 06/22/2009   Method used:   [facility]ronically to        [facility]  [address] [phone]* (retail)       [address]       [place], [place]  [id number]       Ph: [phone] or [phone]       Fax: [phone]   RxID:   [id number]

## 2010-07-03 NOTE — Assessment & Plan Note (Signed)
Summary: follow up from x ray/ [facility]   Vital Signs:  Patient profile:   55 year old female Height:      68 inches Weight:      221 pounds BMI:     33.72 O2 Sat:      97 % on Room air Temp:     98.1 degrees F oral Pulse rate:   78 / minute Pulse rhythm:   regular Resp:     16 per minute BP sitting:   126 / 90  (left arm) Cuff size:   large  Vitals Entered By: Bill Salinas CMA (August 09, 2009 2:01 PM)  Nutrition Counseling: Patient's BMI is greater than 25 and therefore counseled on weight management options.  O2 Flow:  Room air CC: office visit to discuss mri/ ab, Back pain   Primary Care Provider:  Sanda Linger, MD  CC:  office visit to discuss mri/ ab and Back pain.  History of Present Illness:  Back Pain      This is a 55 year old woman who presents with Back pain.   It is unchanged from her previous visit.  The patient reports rest pain, but denies fever, chills, weakness, loss of sensation, fecal incontinence, urinary incontinence, urinary retention, dysuria, inability to work, and inability to care for self.  The pain is located in the mid low back.  The pain radiates to the right leg below the knee.  The pain is made worse by standing or walking.  The pain is made better by inactivity.  Risk factors for serious underlying conditions include duration of pain > 1 month and age >= 50 years.  Current meds are not helping. Her plain xray was positive for spondylosis and anterolisthesis.  Preventive Screening-Counseling & Management  Alcohol-Tobacco     Alcohol drinks/day: 0     Smoking Status: quit     Passive Smoke Exposure: no  Medications Prior to Update: 1)  Multivitamins  Tabs (Multiple Vitamin) .... Take 1 Tablet By Mouth Once A Day 2)  Vitamin E 1000 Unit Caps (Vitamin E) .... Take 1 Capsule By Mouth Once Daily 3)  Levbid 0.375 Mg Xr12h-Tab (Hyoscyamine Sulfate) .... One Tablet By Mouth Two Times A Day 4)  Prilosec 40 Mg Cpdr (Omeprazole) .... One By Mouth Once  Daily 5)  Nucynta 75 Mg Tabs (Tapentadol Hcl) .... One By Mouth Qid As Needed For Pain 6)  Hydrochlorothiazide 25 Mg Tabs (Hydrochlorothiazide) .... One By Mouth Qam  Current Medications (verified): 1)  Multivitamins  Tabs (Multiple Vitamin) .... Take 1 Tablet By Mouth Once A Day 2)  Vitamin E 1000 Unit Caps (Vitamin E) .... Take 1 Capsule By Mouth Once Daily 3)  Levbid 0.375 Mg Xr12h-Tab (Hyoscyamine Sulfate) .... One Tablet By Mouth Two Times A Day 4)  Prilosec 40 Mg Cpdr (Omeprazole) .... One By Mouth Once Daily 5)  Hydrochlorothiazide 25 Mg Tabs (Hydrochlorothiazide) .... One By Mouth Qam 6)  Lortab 7.5-500 Mg Tabs (Hydrocodone-Acetaminophen) .... One By Mouth Qid As Needed For Pain  Allergies (verified): No Known Drug Allergies  Past History:  Past Medical History: Reviewed history from 10/25/2008 and no changes required. GERD Hyperlipidemia Hyperthyroidism Benign fundic gland polyps IBS Arthritis Fibromyalgia Fatty liver  Past Surgical History: Reviewed history from 11/28/2008 and no changes required. Cholecystectomy 2009 (low GB EF, no gallstones) Hysterectomy Laparascopy for adhesions  Family History: Reviewed history from 11/28/2008 and no changes required. Family History of Colon CA 1st degree relative <60 Father  Family History Hypertension Family History of Heart Disease: Mother Family History of Diabetes: Half Brother  Social History: Reviewed history from 11/28/2008 and no changes required. 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 -stopped 28 years ago Daily Caffeine Use-none  Review of Systems       The patient complains of weight gain.  The patient denies anorexia, fever, chest pain, syncope, dyspnea on exertion, peripheral edema, prolonged cough, headaches, hemoptysis, abdominal pain, hematuria, incontinence, abnormal bleeding, and enlarged lymph nodes.     Physical Exam  General:  Well developed, well nourished, no acute distress.overweight-appearing.   Head:  normocephalic and atraumatic.   Mouth:  Oral mucosa and oropharynx without lesions or exudates.  Teeth in good repair. oropharynx pink and moist, no lesions, no aphthous ulcers, no erosions, and no tongue abnormalities.   Neck:  No deformities, masses, or tenderness noted. Lungs:  Normal respiratory effort, chest expands symmetrically. Lungs are clear to auscultation, no crackles or wheezes. Heart:  Normal rate and regular rhythm. S1 and S2 normal without gallop, murmur, click, rub or other extra sounds. Abdomen:  Bowel sounds positive,abdomen soft and non-tender without masses, organomegaly or hernias noted. Msk:  normal ROM, no joint tenderness, no joint swelling, no joint warmth, no redness over joints, no joint deformities, no joint instability, and no crepitation.   Pulses:  R and L carotid,radial,femoral,dorsalis pedis and posterior tibial pulses are full and equal bilaterally Extremities:  1+ left pedal edema and 1+ right pedal edema.   Neurologic:  No cranial nerve deficits noted. Station and gait are normal. Plantar reflexes are down-going bilaterally. DTRs are symmetrical throughout. Sensory, motor and coordinative functions appear intact.  Diabetes Management Exam:    Foot Exam (with socks and/or shoes not present):       Sensory-Pinprick/Light touch:          Left medial foot (L-4): normal          Left dorsal foot (L-5): normal          Left lateral foot (S-1): normal          Right medial foot (L-4): normal          Right dorsal foot (L-5): normal          Right lateral foot (S-1): normal       Sensory-Monofilament:          Left foot: normal          Right foot: normal       Inspection:          Left foot: normal          Right foot: normal       Nails:          Left foot: normal          Right foot: normal   Detailed Back/Spine Exam  General:    Well  developed, well nourished, no acute distress.overweight-appearing.    Gait:    Normal heel-toe gait pattern bilaterally.    Skin:    Intact, no scars, lesions, rashes, cafe'-au-lait spots, or bruising.    Lumbosacral Exam:  Inspection-deformity:    Normal Palpation-spinal tenderness:  Normal Range of Motion:    Forward Flexion:   85 degrees    Hyperextension:   25 degrees    Schober's:        >6 cm    Right Lateral Bend:   30  degrees    Left Lateral Bend:   25 degrees Squatting:  normal Lying Straight Leg Raise:    Right:  negative    Left:  negative Sitting Straight Leg Raise:    Right:  negative    Left:  negative Reverse Straight Leg Raise:    Right:  negative    Left:  negative Contralateral Straight Leg Raise:    Right:  negative    Left:  negative Sciatic Notch:    There is no sciatic notch tenderness. Toe Walking:    Right:  normal    Left:  normal Heel Walking:    Right:  normal    Left:  normal   Impression & Recommendations:  Problem # 1:  BACK PAIN, LUMBAR, WITH RADICULOPATHY (ICD-724.4) Assessment New  The following medications were removed from the medication list:    Nucynta 75 Mg Tabs (Tapentadol hcl) ..... One by mouth qid as needed for pain Her updated medication list for this problem includes:    Lortab 7.5-500 Mg Tabs (Hydrocodone-acetaminophen) ..... One by mouth qid as needed for pain  Orders: Pain Clinic Referral (Pain) Radiology Referral (Radiology)  Discussed use of moist heat or ice, modified activities, medications, and stretching/strengthening exercises. Back care instructions given. To be seen in 2 weeks if no improvement; sooner if worsening of symptoms.   Problem # 2:  HYPERTENSION, BENIGN ESSENTIAL (ICD-401.1) Assessment: Unchanged  Her updated medication list for this problem includes:    Hydrochlorothiazide 25 Mg Tabs (Hydrochlorothiazide) ..... One by mouth qam  BP today: 126/90 Prior BP: 128/84 (08/03/2009)  Prior 10  Yr Risk Heart Disease: Not enough information (05/25/2008)  Labs Reviewed: K+: 3.7 (02/10/2009) Creat: : 0.8 (02/10/2009)     Problem # 3:  BACK PAIN (ICD-724.5) Assessment: Unchanged  The following medications were removed from the medication list:    Nucynta 75 Mg Tabs (Tapentadol hcl) ..... One by mouth qid as needed for pain Her updated medication list for this problem includes:    Lortab 7.5-500 Mg Tabs (Hydrocodone-acetaminophen) ..... One by mouth qid as needed for pain  Complete Medication List: 1)  Multivitamins Tabs (Multiple vitamin) .... Take 1 tablet by mouth once a day 2)  Vitamin E 1000 Unit Caps (Vitamin e) .... Take 1 capsule by mouth once daily 3)  Levbid 0.375 Mg Xr12h-tab (Hyoscyamine sulfate) .... One tablet by mouth two times a day 4)  Prilosec 40 Mg Cpdr (Omeprazole) .... One by mouth once daily 5)  Hydrochlorothiazide 25 Mg Tabs (Hydrochlorothiazide) .... One by mouth qam 6)  Lortab 7.5-500 Mg Tabs (Hydrocodone-acetaminophen) .... One by mouth qid as needed for pain  Patient Instructions: 1)  Please schedule a follow-up appointment in 2 months. 2)  Most patients (90%) with low back pain will improve with time (2-6 weeks). Keep active but avoid activities that are painful. Apply moist heat and/or ice to lower back several times a day. Prescriptions: LORTAB 7.5-500 MG TABS (HYDROCODONE-ACETAMINOPHEN) One by mouth QID as needed for pain  #75 x 4   Entered and Authorized by:   Etta Grandchild MD   Signed by:   Etta Grandchild MD on 08/09/2009   Method used:   Print then Give to Patient   RxID:   [id number]

## 2010-07-03 NOTE — Progress Notes (Signed)
Summary: Nuclear Pre-Procedure  Phone Note Outgoing Call   Call placed by: Milana Na, EMT-P,  August 14, 2009 12:35 PM Summary of Call: Reviewed information on [employer] Information Sheet (see scanned document for further details).  Spoke with patient.     Nuclear Med Background Indications for Stress Test: Evaluation for Ischemia, Abnormal EKG   History: Myocardial Perfusion Study  History Comments: 07/06 MPS NL EF 70%     Nuclear Pre-Procedure Cardiac Risk Factors: Family History - CAD, History of Smoking, Lipids, NIDDM Height (in): 68  Nuclear Med Study Referring MD:  T.Jones

## 2010-07-03 NOTE — Assessment & Plan Note (Signed)
Summary: UNBEARABLE PAIN IN THE RIGHT LEG-LB   Vital Signs:  Patient profile:   55 year old female Height:      68 inches (172.72 cm) Weight:      239.8 pounds (109 kg) O2 Sat:      98 % on Room air Temp:     97.2 degrees F (36.22 degrees C) oral Pulse rate:   69 / minute BP sitting:   120 / 82  (left arm) Cuff size:   large  Vitals Entered By: Orlan Leavens (June 16, 2009 4:26 PM)  O2 Flow:  Room air CC: (R) Hip and leg pain x's 3 weeks Is Patient Diabetic? No Pain Assessment Patient in pain? yes     Location: (R) hip and leg Type: aching   Primary Care Provider:  Sanda Linger, MD  CC:  (R) Hip and leg pain x's 3 weeks.  History of Present Illness: here today with complaint of rght le pain. onset of symptoms was 3 weeks ago. course has been gradual sudden onset and now occurs in constant but waxing/waning pattern. problem precipitated by no injury or trauma symptom characterized as stiffness in lateral knee but now shooting pain from lateral side of hip symptom radiates to toes from side of right hip problem associated with numbness and burning sensation but not associated with swelling of knee, fever or falls. also chrinic low back pain which is slightly worse than usual symptoms improved by sitting but has not tried tylenol or any otc meds symptoms worsened with standing or lying on righht side of hip at night no prior hx of same symptoms.   Current Medications (verified): 1)  Vimovo 500-20 Mg Tbec (Naproxen-Esomeprazole) .... One By Mouth Two Times A Day For Knee Pain 2)  Crestor 20 Mg Tabs (Rosuvastatin Calcium) .... Take 1/2 Tablet By Mouth Once Daily 3)  Multivitamins  Tabs (Multiple Vitamin) .... Take 1 Tablet By Mouth Once A Day 4)  Vitamin E 1000 Unit Caps (Vitamin E) .... Take 1 Capsule By Mouth Once Daily 5)  Levbid 0.375 Mg Xr12h-Tab (Hyoscyamine Sulfate) .... One Tablet By Mouth Two Times A Day  Allergies (verified): No Known Drug Allergies  Past  History:  Past Medical History: Last updated: 10/25/2008 GERD Hyperlipidemia Hyperthyroidism Benign fundic gland polyps IBS Arthritis Fibromyalgia Fatty liver  Review of Systems       The patient complains of difficulty walking.  The patient denies fever, chest pain, headaches, muscle weakness, and suspicious skin lesions.    Physical Exam  General:  Well developed, well nourished, no acute distress.overweight-appearing.   Lungs:  Normal respiratory effort, chest expands symmetrically. Lungs are clear to auscultation, no crackles or wheezes. Heart:  Normal rate and regular rhythm. S1 and S2 normal without gallop, murmur, click, rub or other extra sounds. Msk:  back: full range of motion of lumbar spine. Nontender to palpation. Negative straight leg raise. Deep tendon reflexes symmetrically intact at Achilles and patella, negative clonus. Sensation intact throughout all dermatomes in bilateral lower extremities. Full strength to manual muscle testing in all major muscule groups including EHL, anterior tibialis, gastrocnemius, quadriceps, and iliopsoas. Able to heel and toe walk without difficulty and ambulates with a normal gait. tender to direct palp of right greater troch bursa   Impression & Recommendations:  Problem # 1:  LEG PAIN, RIGHT (ICD-729.5)  suspect current symptoms are combination of lumbar radiculopathy and greater troch bursitis will tx with pred pack and robaxin consider future imaging of l  spine if persisting symptoms unimproved by emperic tx- or injection of greater troch bursa plan for antiinflam tx and muscle relaxants explained to pt including poss se also demonstrated stretches  Orders: Prescription Created Electronically [phone])  Complete Medication List: 1)  Vimovo 500-20 Mg Tbec (Naproxen-esomeprazole) .... One by mouth two times a day for knee pain 2)  Crestor 20 Mg Tabs (Rosuvastatin calcium) .... Take 1/2 tablet by mouth once daily 3)   Multivitamins Tabs (Multiple vitamin) .... Take 1 tablet by mouth once a day 4)  Vitamin E 1000 Unit Caps (Vitamin e) .... Take 1 capsule by mouth once daily 5)  Levbid 0.375 Mg Xr12h-tab (Hyoscyamine sulfate) .... One tablet by mouth two times a day 6)  Prednisone (pak) 10 Mg Tabs (Prednisone) .... Take as directed x 6 days 7)  Robaxin 500 Mg Tabs (Methocarbamol) .Marland Kitchen.. 1 by mouth three times a day as needed for muscle spasms  Patient Instructions: 1)  it was good to see you today.  2)  wil treat your pain for inflammation with pred pack and muscle relaxants (robaxin) as discussed - your prescriptions have been electronically submitted to your pharmacy. Please take as directed. Contact our office if you believe you're having problems with the medication(s).  3)  Most patients (90%) with low back pain and leg pain will improve with time (4-8 weeks). Keep active but avoid activities that are painful. Apply moist heat and/or ice to lower back and side of right hip several times a day. 4)  if continued pain or worsening symptoms after completion of these medications, call for reevaluation Prescriptions: ROBAXIN 500 MG TABS (METHOCARBAMOL) 1 by mouth three times a day as needed for muscle spasms  #30 x 1   Entered and Authorized by:   Newt Lukes MD   Signed by:   Newt Lukes MD on 06/16/2009   Method used:   Electronically to        [facility]  [address] [phone]* (retail)       [address]       [place], [place]  [address]       Ph: [phone] or [phone]       Fax: [phone]   RxID:   [id number] PREDNISONE (PAK) 10 MG TABS (PREDNISONE) take as directed x 6 days  #1 x 0   Entered and Authorized by:   Newt Lukes MD   Signed by:   Newt Lukes MD on 06/16/2009   Method used:   [facility]        [facility]  [address] [phone]* (retail)       [address]       [place], [place]  [id number]        Ph: [phone] or [phone]       Fax: [phone]   RxID:   [id number]

## 2010-07-03 NOTE — Progress Notes (Signed)
Summary: results  Phone Note Call from Patient Call back at [phone]   Summary of Call: Pt left vm requesting results of MRI. Letter is in EMR, left mess to call office back.  Initial call taken by: Lamar Sprinkles, CMA,  August 24, 2009 5:43 PM  Follow-up for Phone Call        Patient notified of results and referral. She is also aware to f/u in 2 mo. and await a call from [facility]. Follow-up by: Lucious Groves,  August 25, 2009 9:27 AM

## 2010-07-03 NOTE — Progress Notes (Signed)
Summary: REFILL   Phone Note Refill Request Call back at [phone] OR [phone]   Refills Requested: Medication #1:  VIMOVO 500-20 MG TBEC One by mouth two times a day for knee pain Patient is requesting a call when complete. Ok to refill?   Initial call taken by: Lamar Sprinkles, CMA,  June 14, 2009 2:07 PM  Follow-up for Phone Call        done Follow-up by: Etta Grandchild MD,  June 14, 2009 2:20 PM  Additional Follow-up for Phone Call Additional follow up Details #1::        Left vm for pt to check with her pharm Additional Follow-up by: Lamar Sprinkles, CMA,  June 14, 2009 4:34 PM    Prescriptions: VIMOVO 500-20 MG TBEC (NAPROXEN-ESOMEPRAZOLE) One by mouth two times a day for knee pain  #30 x 5   Entered and Authorized by:   Etta Grandchild MD   Signed by:   Etta Grandchild MD on 06/14/2009   Method used:   [facility]        [facility]  [address] [phone]* (retail)       [address]       [place], [place]  [id number]       Ph: [phone] or [phone]       Fax: [phone]   RxID:   [id number]

## 2010-10-08 ENCOUNTER — Encounter: Payer: Self-pay | Admitting: Gastroenterology

## 2010-10-08 ENCOUNTER — Ambulatory Visit (INDEPENDENT_AMBULATORY_CARE_PROVIDER_SITE_OTHER): Payer: BC Managed Care – PPO | Admitting: Gastroenterology

## 2010-10-08 VITALS — BP 120/84 | HR 64 | Ht 69.0 in | Wt 235.2 lb

## 2010-10-08 DIAGNOSIS — Z800 Family history of malignant neoplasm of digestive organs: Secondary | ICD-10-CM

## 2010-10-08 DIAGNOSIS — R141 Gas pain: Secondary | ICD-10-CM

## 2010-10-08 DIAGNOSIS — K589 Irritable bowel syndrome without diarrhea: Secondary | ICD-10-CM

## 2010-10-08 DIAGNOSIS — R142 Eructation: Secondary | ICD-10-CM

## 2010-10-08 MED ORDER — HYOSCYAMINE SULFATE ER 0.375 MG PO TB12: 0.3750 mg | ORAL_TABLET | Freq: Two times a day (BID) | ORAL | Status: DC | PRN

## 2010-10-08 MED ORDER — RIFAXIMIN 550 MG PO TABS: 550.0000 mg | ORAL_TABLET | Freq: Three times a day (TID) | ORAL | Status: AC

## 2010-10-08 NOTE — Patient Instructions (Signed)
Your prescription have been sent to your pharmacy for Stanley and Levbid.  Start Miralac Over the Counter 17 grams in 8oz of water twice daily as needed.  Use Gas-X four times a day as needed for gas and bloating.  Low gas diet given to patient.

## 2010-10-08 NOTE — Progress Notes (Signed)
History of Present Illness: This is a 55 year old female with a history of irritable and intestinal gas who I have previously treated. She relates worsening problems with gas, bloating, lower abdominal pain and with constipation. Her constipation has responded to MiraLax. Her main complaint is intestinal gas and bloating. She has significant lower abdominal discomfort when she cannot pass gas. She denies change in stool caliber melena, weight loss, nausea vomiting diarrhea reflux symptoms heartburn. She has ongoing problems with a dry mouth.  Current Medications, Allergies, Past Medical History, Past Surgical History, Family History and Social History were reviewed in [employer] record.  Physical Exam: General: Well developed , well nourished, no acute distress Head: Normocephalic and atraumatic Eyes:  sclerae anicteric, EOMI Ears: Normal auditory acuity Mouth: No deformity or lesions Lungs: Clear throughout to auscultation Heart: Regular rate and rhythm; no murmurs, rubs or bruits Abdomen: Soft, mild lower abdominal tenderness to deep palpation without rebound or guarding and non distended. No masses, hepatosplenomegaly or hernias noted. Normal Bowel sounds Musculoskeletal: Symmetrical with no gross deformities  Pulses:  Normal pulses noted Extremities: No clubbing, cyanosis, edema or deformities noted Neurological: Alert oriented x 4, grossly nonfocal Psychological:  Alert and cooperative. Normal mood and affect  Assessment and Recommendations:  1. Irritable bowel syndrome with constipation. She has a significant component of gas and bloating. Increase water and fiber intake. Begin a low gas diet. MiraLax once or twice daily as needed to adequately manage her constipation. Xifaxan 550 mg 3 times a day for 10 days, Gas-X 4 times a day when necessary and continue probiotics. Begin hyoscyamine 0.375 mg twice a day as needed for abdominal pain.  2. Gas and bloating. Plan  is as above.  3. Family history of colon cancer in her father. Screening colonoscopy recommended at five-year intervals, next exam due October 2013.

## 2010-10-16 NOTE — Op Note (Signed)
[name], [name]               ACCOUNT NO.:  [id number]   MEDICAL RECORD NO.:  [id number]          PATIENT TYPE:  AMB   LOCATION:  SDC                           FACILITY:  [facility]   PHYSICIAN:  Juluis Mire, M.D.   DATE OF BIRTH:  1955/09/20   DATE OF PROCEDURE:  02/06/2007  DATE OF DISCHARGE:                               OPERATIVE REPORT   PREOPERATIVE DIAGNOSIS:  Right adnexal cystic structure.   POSTOPERATIVE DIAGNOSIS:  Right hydrosalpinx.   PROCEDURE:  Open laparoscopy, lysis of adhesions, and removal of the  right pelvic cystic structure which looks to be a hydrosalpinx.   SURGEON:  Juluis Mire, M.D.   ANESTHESIA:  General.   ESTIMATED BLOOD LOSS:  Minimal.   PACKS AND DRAINS:  None.   INTRAOPERATIVE BLOOD REPLACED:  None.   COMPLICATIONS:  None.   INDICATIONS:  Dictated in the history and physical.   DESCRIPTION OF PROCEDURE:  The patient was taken to the OR and placed in  the supine position.  After a satisfactory level of general endotracheal  anesthesia was obtained, the patient was placed in the dorsal lithotomy  position using the Allen stirrups.  The abdomen, perineum, and vagina  were prepped out with Betadine.  The bladder was a emptied by in-and-out  catheterization.  The patient was then draped as a sterile field.  A  subumbilical incision was made with the knife and extended through the  subcutaneous tissue.  The fascia was identified and entered sharply and  the incision in the fascia extended laterally.  The rectus muscles were  separated in the midline.  The peritoneum was identified and elevated  and entered with the scissors.  The open laparoscopic trocar was put in  place and secured.  The abdomen was inflated with carbon dioxide.  Visualization revealed no evidence of injury to adjacent organs.  We  then put a 5-mm trocar in the suprapubic area.  Visualization of the  upper abdomen, including the liver and gallbladder, were clear.  Both  lateral gutters were clear.  She did have some adhesions to the pelvic  cavity from the sigmoid colon.  These were very flimsy.  We took these  down with the scissors.  We identified the cystic structure on the right  side.  This turned out to be a hydrosalpinx.  The right ovary was  surgically absent.  The left ovary was adherent to the sigmoid colon.  We took some these adhesions down.  They got thicker, and we left those  in place at this point.  We thoroughly irrigated the pelvis.  We had  good hemostasis.  We elevated the right hydrosalpinx and identified the  vasculature coming in.  We could easily identify the ureter and the  right pelvic sidewall.  Well above the ureter, we cauterized and incised  the vascular attachment to the hydrosalpinx.  We continued cautery  incision of the peritoneal attachment and removed the intact  hydrosalpinx through the subumbilical port.  We thoroughly irrigated the  pelvis.  We had good hemostasis.  No evidence of injury to  adjacent  organs.  The abdomen was deflated of its carbon dioxide.  All trocars  were removed.  The subumbilical fascia was closed with a figure-of-eight  of 0 Vicryl, the skin with interrupted subcuticulars of 4-0 Vicryl.  The  suprapubic incision was closed with Dermabond.  The patient was taken  out of the dorsal lithotomy position.  Once alert and extubated, she was  transferred recovery room in good condition.  Sponge, instrument, and  needle counts were reported as correct by circulating nurse x2.      Juluis Mire, M.D.  Electronically Signed     JSM/MEDQ  D:  02/06/2007  T:  02/06/2007  Job:  [id number]

## 2010-10-16 NOTE — Op Note (Signed)
[name], [name]               ACCOUNT NO.:  [id number]   MEDICAL RECORD NO.:  [id number]          PATIENT TYPE:  OIB   LOCATION:  1539                         [facility]AC[facility]  [facility]   PHYSICIAN:  Adolph Pollack, M.D.DATE OF BIRTH:  1956/04/14   DATE OF PROCEDURE:  07/02/2007  DATE OF DISCHARGE:                               OPERATIVE REPORT   PREOPERATIVE DIAGNOSIS:  Biliary dyskinesia.   POSTOPERATIVE DIAGNOSIS:  Biliary dyskinesia.   PROCEDURE:  Laparoscopic cholecystectomy with intraoperative  cholangiogram.   SURGEON:  Adolph Pollack, M.D.   ASSISTANT:  Anselm Pancoast. Zachery Dakins, M.D.   ANESTHESIA:  General.   INDICATION:  [name] is a 55 year old female who has been having  upper abdominal pain and right upper quadrant pain that seems to bother  her most after meals.  She has had a thorough evaluation with upper GI  studies, endoscopy, and ultrasound.  None of this explains her  pathology.  CT scan was unrevealing and nuclear medicine hepatobiliary  scan demonstrated depressed gallbladder function consistent with biliary  dyskinesia.  She now presents for laparoscopic cholecystectomy.  The  procedure, risks, and potential success rate (up to 80%) were discussed  with her preoperatively.   TECHNIQUE:  She is seen in the holding area and brought to the operating  room, placed supine on the operating table, and a general anesthetic was  administered.  The abdominal wall was sterilely prepped and draped.  She  had a previous subumbilical incision.  Marcaine was infiltrated in the  supraumbilical region and a small supraumbilical incision was made  through the skin and subcutaneous tissue.  The midline fascia was  identified and an incision made in it.  The peritoneum was identified  and the peritoneal cavity was entered under direct vision.  A  pursestring suture of 0 Vicryl was placed around the fascial edges.  A  Hassan trocar was introduced into the peritoneal  cavity and  pneumoperitoneum created by insufflation of CO2 gas.   Next, a laparoscope was introduced.  No liver lesions were noted.  She  was placed in reversed Trendelenburg position with the right side tilted  slightly up.  An 11 mm trocar was placed through an epigastric incision  and two 5 mm trocars placed in the right mid lateral abdomen.  The  fundus of the gallbladder was grasped.  No acute inflammatory changes  were noted.  The fundus was retracted toward the right shoulder.  The  infundibulum was grasped.  With dissection on the gallbladder, the  infundibulum was mobilized.  I then identified the cystic duct and the  cystic artery.  Using blunt dissection, windows were created around  these.  A clip was placed in the cystic duct gallbladder junction.  A  small incision was made in the cystic duct and some bile milked back.  A  cholangiocath was passed in the anterior abdominal wall and placed into  the cystic duct and cholangiogram was performed.   Under real time fluoroscopy, dilute contrast was injected into the  cystic duct which was of moderate to long length.  The  common hepatic,  right and left hepatic, common bile ducts all filled promptly and  contrast drained promptly into the duodenum without obvious evidence of  obstruction.  The final report is pending radiologist interpretation.   The cholangiocatheter was then removed.  The cystic duct was clipped  three times proximally and divided.  The cystic artery was then clipped  and divided.  The gallbladder was then dissected free from the liver  intact and placed in an Endopouch bag.  The gallbladder fossa was then  copiously irrigated with saline solution and bleeding points were  controlled with electrocautery.  The area was reinspected and no  bleeding or bile leak was noted.  The irrigation was evacuated as much  as  possible.  The gallbladder was then removed through the  supraumbilical port and the  supraumbilical fascial defect was closed by  tightening up and tying down the pursestring suture under laparoscopic  vision.  The remaining trocars were removed and pneumoperitoneum was  released.  The skin incisions were closed with 4-0 Monocryl subcuticular  stitches.  Steri-Strips and sterile dressings were applied.  She  tolerated the procedure without apparent complications and was taken to  recovery in satisfactory condition.      Adolph Pollack, M.D.  Electronically Signed     TJR/MEDQ  D:  07/02/2007  T:  07/02/2007  Job:  [id number]   cc:   Georgann Housekeeper, MD  Fax: [phone]   Llana Aliment. Malon Kindle., M.D.  Fax: [phone]

## 2010-10-16 NOTE — H&P (Signed)
NAME:  [name], [name] NO.:  [id number]   MEDICAL RECORD NO.:  [id number]         PATIENT TYPE:  WAMB   LOCATION:                                FACILITY:  [facility]   PHYSICIAN:  Juluis Mire, M.D.        DATE OF BIRTH:   DATE OF ADMISSION:  02/06/2007  DATE OF DISCHARGE:                              HISTORY & PHYSICAL   The patient is a 55 year old gravida 5, para 2, abortus 3 female,  presents for laparoscopy.   The patient has had a previous vaginal hysterectomy, and a right tube  and ovary were taken out.  She had been having right lower quadrant  pain.  Ultrasound evaluation revealed a cystic structure in the right  adnexa that could represent either an ovarian remnant or possibly a  tube.  Her CA-125 was normal.  In view of the problems with the pain and  the cystic structure, we are going to proceed with laparoscopic  evaluation and removal.   ALLERGIES:  In terms of allergies, NO KNOWN DRUG ALLERGIES.   MEDICATIONS:  Include Zocor, Maxzide, a Amitiza and Protonix.   PAST MEDICAL HISTORY:  She is being managed actively for hypertension,  as well as hypercholesterolemia by Dr. Rene Paci.   PAST SURGICAL HISTORY:  She had the previous vaginal hysterectomy with  the right salpingo-oophorectomy.   OBSTETRICAL HISTORY:  She has had two vaginal deliveries and three  miscarriages.   SOCIAL HISTORY:  Reveals no tobacco, alcohol use.   REVIEW OF SYSTEMS:  Noncontributory.   FAMILY HISTORY:  Father with a history of colon cancer, also history of  hypertension.  Mother with history of heart disease.   PHYSICAL EXAM:  VITAL SIGNS:  Is as follows:  Patient is afebrile,  stable vital signs.  HEENT:  The patient normocephalic.  Pupils equal, round, reactive to  light and accommodation.  Extraocular movements were intact.  Sclerae  anicteric, are clear.  Oropharynx clear.  NECK:  Without thyromegaly.  BREASTS:  No discrete masses.  LUNGS:  Clear.  CARDIOVASCULAR:  Regular rate, no murmurs or gallops.  ABDOMEN:  Benign of mass, organomegaly or tenderness.  PELVIC:  Normal external genitalia.  Vaginal mucosa is clear.  Cuff is  intact.  Right side is tender.  No masses appreciated.  Left adnexa  unremarkable.  EXTREMITIES:  Trace edema.  NEUROLOGIC:  Grossly within normal limits.   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   PLAN:  The patient to ago laparoscopic evaluation for management.  The  risks of surgery have been discussed, including with the risk of  infection.  The risk of hemorrhage could require transfusion, with the  risk of AIDS or hepatitis, the risk of injury to adjacent organs  including bladder, bowel, ureters that could require further exploratory  surgery.  Risk of deep venous thrombosis and pulmonary emboli.  The  patient expressed understanding of indications and risks.      Juluis Mire, M.D.  Electronically Signed     JSM/MEDQ  D:  02/06/2007  T:  02/06/2007  Job:  [id number]

## 2010-10-19 NOTE — Discharge Summary (Signed)
NAME:  [name], [name]                         ACCOUNT NO.:  [id number]   MEDICAL RECORD NO.:  [id number]                   PATIENT TYPE:  INP   LOCATION:  4703                                 FACILITY:  [facility]   PHYSICIAN:  Georgann Housekeeper, M.D.                 DATE OF BIRTH:  03-01-1956   DATE OF ADMISSION:  01/10/2004  DATE OF DISCHARGE:  01/12/2004                                 DISCHARGE SUMMARY   DISCHARGE DIAGNOSES:  1. Chest pain, rule out myocardial infarction.  2. Hiatal hernia.  3. Hypertension.  4. Hyperlipidemia.   CONDITION ON DISCHARGE:  Stable.   DISCHARGE MEDICATIONS:  1. Maxzide 37.5/25 one daily.  2. Lipitor 10 mg q.d.  3. Protonix 40 mg daily.  4. Zelnorm 6 mg b.i.d.  5. Imitrex p.r.n.   CONSULTATIONS:  Cardiology with Cardiolite stress test.   HOSPITAL COURSE:  Please see H&P for details.  The patient 55 years old with  history of hypertension, hyperlipidemia and family history of cardiac  disease.  She had an episode of epigastric discomfort and left arm pain  which was relieved with nitroglycerin in the office.  The patient had no EKG  changes or chest x-ray.  The patient was admitted to telemetry ruled out for  an MI.  Cardiac enzymes were negative.  Troponin was negative.  She remained  normal sinus rhythm on the monitor.  She did not have any more of the  epigastric or left arm pain.  The patient's blood pressure remained stable.  She was on the Lovenox and did not require nitroglycerin.  As far as  cardiology consultation, she underwent Cardiolite stress test on August 11,  and this stress test was normal.  The patient's chest pain was thought to be  noncardiac.  She had history of acid reflux disease and stress.  She was  continued on Protonix and her current blood pressure medication.   FOLLOW UP:  She will follow up with me in the office as routine in 2-3  weeks.                                                Georgann Housekeeper, M.D.    KH/MEDQ   D:  01/16/2004  T:  01/16/2004  Job:  [id number]

## 2010-10-19 NOTE — Consult Note (Signed)
NAME:  [name], [name]                         ACCOUNT NO.:  [id number]   MEDICAL RECORD NO.:  [id number]                   PATIENT TYPE:  INP   LOCATION:  4703                                 FACILITY:  [facility]   PHYSICIAN:  Armanda Magic, M.D.                  DATE OF BIRTH:  1956/01/29   DATE OF CONSULTATION:  01/11/2004  DATE OF DISCHARGE:                                   CONSULTATION   REFERRING PHYSICIAN:  Dr. Georgann Housekeeper   CHIEF COMPLAINT:  Chest pain.   This is a 55 year old pleasant black female with a family history of  premature coronary disease who was admitted with complaints of chest pain on  January 10, 2004.  She described it as a left arm achiness and epigastric pain  with duration of approximately one-half hours, although the arm achiness  lasted longer than that.  She said it initially occurred while walking down  the hallway at work and was associated with nausea, but no diaphoresis.  She  called Dr. Venita Sheffield office and was seen there and given one sublingual  nitroglycerin spray with resolution of left arm pain.  EKG in his office was  normal.  She says she had left arm achiness all day long for about five to  six hours before showing up to the physician's office.  Again, it started as  a left arm ache with numbness in her fourth and fifth finger and increase in  intensity.  There was no chest pain initially, but subsequently developed  nausea, shortness of breath, and mild sweatiness.  She does have a history  of prior chest heaviness radiating to her left arm lasting a few seconds at  a time over the past three months.  She has also had increasing fatigue for  the past few weeks.   PAST MEDICAL HISTORY:  1. Hypertension.  2. Dyslipidemia.  3. Chest pain with negative stress test in 2001.  4. Osteoarthritis of the knees.  5. Migraines.  6. Irritable bowel.  7. Gastroesophageal reflux.   ALLERGIES:  None.   MEDICATIONS:  1. Maxzide 37.5/25 mg daily.  2.  Lipitor 10 mg daily.  3. Protonix 40 mg daily.  4. Zelnorm 6 mg b.i.d.  5. Desipramine 25 mg one to two q.h.s. p.r.n.  6. Imitrex 100 mg p.r.n. for headache.   SOCIAL HISTORY:  She is a former smoker, but quit 23 years ago.  She smoked  less than one pack per day for five years.  She has a remote history of  alcohol use.  She is married.  She has two grown children and works.   FAMILY HISTORY:  Her mother died at 72 secondary to acute MI.  Her father is  in his 38s with hypertension.   REVIEW OF SYSTEMS:  Other than what stated in HPI, she had a sensation of  fever, then chills and thinks she  may be going through menopause.  Otherwise, review of systems is negative.   PHYSICAL EXAMINATION:  VITAL SIGNS:  Blood pressure 117/81, heart rate 79,  respirations 16.  She is afebrile.  GENERAL:  This is a well-developed, well-nourished female in no acute  distress.  HEENT:  Benign.  NECK:  Supple without lymphadenopathy.  Carotid upstrokes +2 bilaterally, no  bruits.  LUNGS:  Clear to auscultation throughout.  HEART:  Regular rate and rhythm.  No murmurs, rubs, or gallops.  Normal S1,  S2.  ABDOMEN:  Soft, nontender, nondistended with active bowel sounds.  No  hepatosplenomegaly.  EXTREMITIES:  No edema.  Good distal pulses distally.   LABORATORIES:  CPK 234, 196, MB 0.7, 0.6, relative index 0.3, 0.3, troponin  0.01, 0.01.  Sodium 139, potassium 3.3, chloride 103, bicarbonate 28, BUN 9,  creatinine 0.9, glucose 100.  White cell count 5.3, hemoglobin 13.2,  hematocrit 37.6, platelet count 240.  PTT 29, PT 12.3, INR 0.9.  EKG done in  Dr. Venita Sheffield office on August 9 shows normal sinus rhythm with no ST  changes.  EKG done in the hospital again shows normal sinus rhythm with no  ST-T wave abnormalities.   ASSESSMENT:  1. Atypical chest pain, currently pain-free.  Cardiac enzymes x2 are     negative.  She does have a strong family history of coronary disease and     therefore would  recommend proceeding with a stress Cardiolite study to     rule out inducible ischemia.  2. Hypertension, well controlled.  3. Dyslipidemia.  4. Hypokalemia, will replete.                                               Armanda Magic, M.D.    TT/MEDQ  D:  01/11/2004  T:  01/11/2004  Job:  [id number]

## 2010-10-19 NOTE — H&P (Signed)
NAME:  [name], [name]                         ACCOUNT NO.:  [id number]   MEDICAL RECORD NO.:  [id number]                   PATIENT TYPE:  INP   LOCATION:                                       FACILITY:  [facility]   PHYSICIAN:  Georgann Housekeeper, M.D.                 DATE OF BIRTH:  November 19, 1955   DATE OF ADMISSION:  01/10/2004  DATE OF DISCHARGE:                                HISTORY & PHYSICAL   CHIEF COMPLAINT:  Chest and arm pain.   HISTORY OF PRESENT ILLNESS:  [name] is a very pleasant 55 year old  African-American female patient of Dr. Georgann Housekeeper, who began having left  arm achiness and epigastric pain approximately 1-1/2 hours ago.  She was at  work at the time, walking in the hall.  She had associated nausea but not  sweats, also associated shortness of breath.  The pain did not radiate.  She  presented to the office still in pain.  She was given nitroglycerin spray  0.4 mg as well as Phenergan 25 mg IM with resolution of left arm pain within  5 minutes after nitroglycerin spray.   The patient has a positive family history of MI with mother dying at age 22  with coronary artery disease.  The patient also has a history of  hyperlipidemia and hypertension.   Due to risk factors and relief of arm pain from nitroglycerin, she is being  admitted.   REVIEW OF SYSTEMS:  General fatigue since chest pain started.  No fever or  chills.  RESPIRATORY:  No cough or wheezing, some shortness of breath.  CHEST:  As above, pain in the epigastric area associated with deep  breathing. She has had no edema.   PAST MEDICAL HISTORY:  1. History of chest pain with negative stress test in 2001.  2. GERD.  3. Arthritis, knees.  4. Migraine headache.  5. Irritable bowel syndrome.   PAST SURGICAL HISTORY:  Status post hysterectomy and left oophorectomy for  cysts.   MEDICATIONS:  1. Maxzide 37.5/25 mg 1 a day.  2. Lipitor 20 mg 1 a day.  3. Protonix 40 mg 1 a day.  4. Zelnorm 6 mg  b.i.d.  5. Desipramine 25 mg 1 to 2 p.o. at h.s. daily p.r.n.  6. Imitrex 100 mg p.r.n. headaches.   ALLERGIES:  No known drug allergies.   SOCIAL HISTORY:  Married, husband in his 105s, had a CVA and is on  disability.  The patient works full time and under a lot of stress at work.  She does not consume alcoholic beverages.  Smoked for about five years in  the distant past.   FAMILY HISTORY:  Mother died at age 14 from a heart attack, CVA.  Father is  in his 40s with hypertension.   OBJECTIVE:  VITAL SIGNS:  Pulse 78, respirations 20, blood pressure 130/80,  O2 saturation 98% on  room air.  GENERAL:  She is weak appearing but in no acute distress.  SKIN:  Warm and dry.  HEENT:  Ear, nose, and throat unremarkable.  NECK:  No JVD.  HEART:  Normal S1, S2 without murmur, S3, or gallop.  LUNGS:  Breath sounds are clear throughout.  ABDOMEN:  Soft and nondistended.  She is slightly tender in the epigastric  area, but there is no other tenderness noted.  EXTREMITIES:  No edema.  NEUROLOGIC:  Nonfocal.   LABORATORY AND X-RAY DATA:  EKG reveals no changes.   Chest x-ray unremarkable.   IMPRESSION:  1. Chest pain.  2. Hypertension.  3. Hypercholesterolemia.   PLAN:  1. Admit to monitored bed.  2. Lovenox subcutaneously.  3. Cardiac isoenzymes.  4. Cardiology referral.      Tamera C. Holli Humbles, M.D.    TCL/MEDQ  D:  01/10/2004  T:  01/10/2004  Job:  [id number]

## 2010-11-20 ENCOUNTER — Encounter: Payer: Self-pay | Admitting: Internal Medicine

## 2010-11-20 LAB — THYROID PROFILE - CHCC
T3 Uptake: 28.3
T4, Total: 9.2

## 2010-11-20 LAB — LIPID PANEL
HDL: 44 mg/dL (ref 35–70)
LDL Calculated: 184 mg/dL — AB (ref 0–160)
Total CHOL/HDL Ratio: 6.2
VLDL Cholesterol Cal: 44

## 2010-11-20 LAB — GLUCOSE, RANDOM: Glucose: 122

## 2010-11-20 LAB — VITAMIN D 25 HYDROXY (VIT D DEFICIENCY, FRACTURES): Vit D, 25-Hydroxy: 34

## 2010-12-11 ENCOUNTER — Encounter: Payer: Self-pay | Admitting: Internal Medicine

## 2010-12-17 ENCOUNTER — Encounter: Payer: Self-pay | Admitting: Internal Medicine

## 2010-12-18 ENCOUNTER — Encounter: Payer: Self-pay | Admitting: Internal Medicine

## 2010-12-18 ENCOUNTER — Other Ambulatory Visit (INDEPENDENT_AMBULATORY_CARE_PROVIDER_SITE_OTHER): Payer: BC Managed Care – PPO

## 2010-12-18 ENCOUNTER — Ambulatory Visit (INDEPENDENT_AMBULATORY_CARE_PROVIDER_SITE_OTHER): Payer: BC Managed Care – PPO | Admitting: Internal Medicine

## 2010-12-18 DIAGNOSIS — IMO0001 Reserved for inherently not codable concepts without codable children: Secondary | ICD-10-CM

## 2010-12-18 DIAGNOSIS — K219 Gastro-esophageal reflux disease without esophagitis: Secondary | ICD-10-CM

## 2010-12-18 DIAGNOSIS — E785 Hyperlipidemia, unspecified: Secondary | ICD-10-CM

## 2010-12-18 DIAGNOSIS — I10 Essential (primary) hypertension: Secondary | ICD-10-CM

## 2010-12-18 DIAGNOSIS — IMO0002 Reserved for concepts with insufficient information to code with codable children: Secondary | ICD-10-CM

## 2010-12-18 DIAGNOSIS — E119 Type 2 diabetes mellitus without complications: Secondary | ICD-10-CM

## 2010-12-18 DIAGNOSIS — K146 Glossodynia: Secondary | ICD-10-CM

## 2010-12-18 LAB — THYROID PROFILE - CHCC
Free T4: 1.19
Free Thyroxine Index: 2.6
T4, Total: 9.2
TSH: 0.3976

## 2010-12-18 LAB — COMPREHENSIVE METABOLIC PANEL
AST: 18 U/L (ref 0–37)
Albumin: 4.4 g/dL (ref 3.5–5.2)
Alkaline Phosphatase: 57 U/L (ref 39–117)
BUN: 13 mg/dL (ref 6–23)
Potassium: 3.9 mEq/L (ref 3.5–5.1)
Sodium: 141 mEq/L (ref 135–145)
Total Bilirubin: 0.7 mg/dL (ref 0.3–1.2)

## 2010-12-18 LAB — TRIGLYCERIDES
LDL Calculated: 184 mg/dL — AB (ref 0–160)
Total CHOL/HDL Ratio: 6.2
VLDL Cholesterol Cal: 44

## 2010-12-18 LAB — HIGH SENSITIVITY CRP: CRP, High Sensitivity: 2.03 mg/L (ref 0.000–5.000)

## 2010-12-18 LAB — VITAMIN B12: Vitamin B-12: 623 pg/mL (ref 211–911)

## 2010-12-18 LAB — CK: Total CK: 149 U/L (ref 7–177)

## 2010-12-18 LAB — CHOLESTEROL, TOTAL: Cholesterol: 272 mg/dL — AB (ref 0–200)

## 2010-12-18 LAB — SEDIMENTATION RATE: Sed Rate: 17 mm/hr (ref 0–22)

## 2010-12-18 LAB — VITAMIN D 25 HYDROXY (VIT D DEFICIENCY, FRACTURES): Vit D, 25-Hydroxy: 34

## 2010-12-18 MED ORDER — L-METHYLFOLATE-B6-B12 3-35-2 MG PO TABS: 1.0000 | ORAL_TABLET | Freq: Two times a day (BID) | ORAL | Status: DC

## 2010-12-18 MED ORDER — MILNACIPRAN HCL 12.5 & 25 & 50 MG PO MISC: 1.0000 | Freq: Two times a day (BID) | ORAL | Status: DC

## 2010-12-18 MED ORDER — ROSUVASTATIN CALCIUM 20 MG PO TABS: 20.0000 mg | ORAL_TABLET | Freq: Every day | ORAL | Status: DC

## 2010-12-18 NOTE — Patient Instructions (Signed)
Hypercholesterolemia High Blood Cholesterol Cholesterol is a white, waxy, fat-like protein needed by your body in small amounts. The liver makes all the cholesterol you need. It is carried from the liver by the blood through the blood vessels. Deposits (plaque) may build up on blood vessel walls. This makes the arteries narrower and stiffer. Plaque increases the risk for heart attack and stroke. You cannot feel your cholesterol level even if it is very high. The only way to know is by a blood test to check your lipid (fats) levels. Once you know your cholesterol levels, you should keep a record of the test results. Work with your caregiver to to keep your levels in the desired range. WHAT THE RESULTS MEAN:  Total cholesterol is a rough measure of all the cholesterol in your blood.   LDL is the so-called bad cholesterol. This is the type that deposits cholesterol in the walls of the arteries. You want this level to be low.   HDL is the good cholesterol because it cleans the arteries and carries the LDL away. You want this level to be high.   Triglycerides are fat that the body can either burn for energy or store. High levels are closely linked to heart disease.  DESIRED LEVELS:  Total cholesterol below 200.   LDL below 100 for people at risk, below 70 for very high risk.   HDL above 50 is good, above 60 is best.   Triglycerides below 150.  HOW TO LOWER YOUR CHOLESTEROL:  Diet.   Choose fish or white meat chicken and [place], roasted or baked. Limit fatty cuts of red meat, fried foods, and processed meats, such as sausage and lunch meat.   Eat lots of fresh fruits and vegetables. Choose whole grains, beans, pasta, potatoes and cereals.   Use only small amounts of olive, corn or canola oils. Avoid butter, mayonnaise, shortening or palm kernel oils. Avoid foods with trans-fats.   Use skim/nonfat milk and low-fat/nonfat yogurt and cheeses. Avoid whole milk, cream, ice cream, egg yolks and  cheeses. Healthy desserts include angel food cake, gingersnaps, animal crackers, hard candy, popsicles, and low-fat/nonfat frozen yogurt. Avoid pastries, cakes, pies and cookies.   Exercise.   A regular program helps decrease LDL and raises HDL.   Helps with weight control.   Do things that increase your activity level like gardening, walking, or taking the stairs.   Medication.   May be prescribed by your caregiver to help lowering cholesterol and the risk for heart disease.   You may need medicine even if your levels are normal if you have several risk factors.  HOME CARE INSTRUCTIONS  Follow your diet and exercise programs as suggested by your caregiver.   Take medications as directed.   Have blood work done when your caregiver feels it is necessary.  MAKE SURE YOU:   Understand these instructions.   Will watch your condition.   Will get help right away if you are not doing well or get worse.  Document Released: 05/20/2005 Document Re-Released: 05/02/2008 [facility] Patient Information 2011 [place], [place].Fibromyalgia Fibromyalgia is a disorder that is often misunderstood. It is associated with muscular pains and tenderness that comes and goes. It is often associated with fatigue and sleep disturbances. Though it tends to be long-lasting, fibromyalgia is not life-threatening. CAUSES The exact cause of fibromyalgia is unknown. People with certain gene types are predisposed to developing fibromyalgia and other conditions. Certain factors can play a role as triggers, such as:  Spine  disorders.   Arthritis.   Severe injury (trauma) and other physical stressors.   Emotional stressors.  SYMPTOMS  The main symptom is pain and stiffness in the muscles and joints, which can vary over time.   Sleep and fatigue problems.  Other related symptoms may include:  Bowel and bladder problems.   Headaches.   Visual problems.   Problems with odors and noises.   Depression or  mood changes.   Painful periods (dysmenorrhea).   Dryness of the skin or eyes.  DIAGNOSIS There are no specific tests for diagnosing fibromyalgia. Patients can be diagnosed accurately from the specific symptoms they have. The diagnosis is made by determining that nothing else is causing the problems. TREATMENT There is no cure. Management includes medicines and an active, healthy lifestyle. The goal is to enhance physical fitness, decrease pain, and improve sleep. HOME CARE INSTRUCTIONS  Only take over-the-counter or prescription medicines as directed by your caregiver. Sleeping pills, tranquilizers, and pain medicines may make your problems worse.   Low-impact aerobic exercise is very important and advised for treatment. At first, it may seem to make pain worse. Gradually increasing your tolerance will overcome this feeling.   Learning relaxation techniques and how to control stress will help you. Biofeedback, visual imagery, hypnosis, muscle relaxation, yoga, and meditation are all options.   Anti-inflammatory medicines and physical therapy may provide short-term help.   Acupuncture or massage treatments may help.   Take muscle relaxant medicines as suggested by your caregiver.   Avoid stressful situations.   Plan a healthy lifestyle. This includes your diet, sleep, rest, exercise, and friends.   Find and practice a hobby you enjoy.   Join a fibromyalgia support group for interaction, ideas, and sharing advice. This may be helpful.  SEEK MEDICAL CARE IF: You are not having good results or improvement from your treatment. FOR MORE INFORMATION National Fibromyalgia Association: www.fmaware.org Arthritis Foundation: www.arthritis.org Document Released: 05/20/2005 Document Re-Released: 11/07/2009 [facility] Patient Information 2011 [place], [place].

## 2010-12-18 NOTE — Progress Notes (Signed)
Subjective:    Patient ID: [name], female    DOB: 1956/01/06, 55 y.o.   MRN: [id number]  Diabetes She presents for her follow-up diabetic visit. She has type 2 diabetes mellitus. Her disease course has been stable. There are no hypoglycemic associated symptoms. Pertinent negatives for hypoglycemia include no confusion, dizziness, headaches, nervousness/anxiousness, pallor, seizures, speech difficulty or tremors. There are no diabetic associated symptoms. Pertinent negatives for diabetes include no chest pain, no fatigue and no weakness. There are no hypoglycemic complications. Symptoms are stable. There are no diabetic complications. Current diabetic treatment includes diet. She is compliant with treatment most of the time. Her weight is stable. She is following a generally healthy diet. Meal planning includes avoidance of concentrated sweets. She has had a previous visit with a dietician. She participates in exercise intermittently. Her breakfast blood glucose range is generally 90-110 mg/dl. Her lunch blood glucose range is generally 110-130 mg/dl. Her dinner blood glucose range is generally 110-130 mg/dl. Her highest blood glucose is 110-130 mg/dl. Her overall blood glucose range is 110-130 mg/dl. An ACE inhibitor/angiotensin II receptor blocker is not being taken. She does not see a podiatrist.Eye exam is current.  Hyperlipidemia This is a chronic problem. The current episode started more than 1 year ago. The problem is uncontrolled. Recent lipid tests were reviewed and are variable. Exacerbating diseases include diabetes and obesity. She has no history of chronic renal disease, hypothyroidism, liver disease or nephrotic syndrome. Factors aggravating her hyperlipidemia include thiazides. Associated symptoms include myalgias. Pertinent negatives include no chest pain, focal sensory loss, focal weakness, leg pain or shortness of breath. Current antihyperlipidemic treatment includes diet change. The  current treatment provides no improvement of lipids. Compliance problems include adherence to exercise and adherence to diet.    She tells me that she is having a flare up of fibromyalgia pain (hips, legs, shoulders, back) and has had some trouble sleeping so her GYN doctor put her on Ambien. She took Lyrica previously and it did not help her pain. Also, she has a persistent burning sensation on her tongue and would like help with this. She feels like it is most problematic later in the day when she is tired and worried about her work.  Review of Systems  Constitutional: Negative for fever, chills, diaphoresis, activity change, appetite change, fatigue and unexpected weight change.  HENT: Negative for hearing loss, ear pain, nosebleeds, congestion, sore throat, facial swelling, rhinorrhea, sneezing, drooling, mouth sores, trouble swallowing, neck pain, neck stiffness, dental problem, voice change, postnasal drip, sinus pressure, tinnitus and ear discharge.   Eyes: Negative for photophobia, redness and visual disturbance.  Respiratory: Negative for apnea, cough, choking, chest tightness, shortness of breath, wheezing and stridor.   Cardiovascular: Negative for chest pain, palpitations and leg swelling.  Gastrointestinal: Negative for nausea, vomiting, abdominal pain, diarrhea, constipation and anal bleeding.  Genitourinary: Negative for dysuria, urgency, frequency, hematuria, flank pain, decreased urine volume, enuresis, difficulty urinating and dyspareunia.  Musculoskeletal: Positive for myalgias and arthralgias. Negative for back pain, joint swelling and gait problem.  Skin: Negative for color change, pallor, rash and wound.  Neurological: Negative for dizziness, tremors, focal weakness, seizures, syncope, facial asymmetry, speech difficulty, weakness, light-headedness, numbness and headaches.  Hematological: Negative for adenopathy. Does not bruise/bleed easily.  Psychiatric/Behavioral: Positive  for sleep disturbance (DFA and FAa's). Negative for suicidal ideas, hallucinations, behavioral problems, confusion, self-injury, dysphoric mood, decreased concentration and agitation. The patient is not nervous/anxious and is not hyperactive.  Objective:   Physical Exam  Vitals reviewed. Constitutional: She is oriented to person, place, and time. She appears well-developed and well-nourished. No distress.  HENT:  Head: Normocephalic.  Right Ear: External ear normal.  Left Ear: External ear normal.  Nose: Nose normal.  Mouth/Throat: Oropharynx is clear and moist. No oropharyngeal exudate.  Eyes: Conjunctivae and EOM are normal. Pupils are equal, round, and reactive to light. Right eye exhibits no discharge. Left eye exhibits no discharge. No scleral icterus.  Neck: Normal range of motion. Neck supple. No JVD present. No tracheal deviation present. No thyromegaly present.  Cardiovascular: Normal rate, regular rhythm, normal heart sounds and intact distal pulses.  Exam reveals no gallop and no friction rub.   No murmur heard. Pulmonary/Chest: Effort normal and breath sounds normal. No stridor. No respiratory distress. She has no wheezes. She has no rales. She exhibits no tenderness.  Abdominal: Soft. Bowel sounds are normal. She exhibits no distension and no mass. There is no tenderness. There is no rebound and no guarding.  Musculoskeletal: Normal range of motion. She exhibits no edema and no tenderness.  Lymphadenopathy:    She has no cervical adenopathy.  Neurological: She is alert and oriented to person, place, and time. She has normal reflexes. She displays normal reflexes. No cranial nerve deficit. She exhibits normal muscle tone. Coordination normal.  Skin: Skin is warm and dry. No rash noted. She is not diaphoretic. No erythema. No pallor.  Psychiatric: She has a normal mood and affect. Her behavior is normal. Judgment and thought content normal.        Lab Results    Component Value Date   WBC 4.3* 02/10/2009   HGB 13.5 02/10/2009   HCT 39.0 02/10/2009   PLT 210.0 02/10/2009   CHOL 272* 11/14/2010   TRIG 221 11/14/2010   HDL 44 11/14/2010   ALT 30 4/0/9811   AST 16 08/09/2008   NA 144 10/11/2009   K 3.9 10/11/2009   CL 102 10/11/2009   CREATININE 0.7 10/11/2009   BUN 15 10/11/2009   CO2 33* 10/11/2009   TSH 0.376 11/14/2010   HGBA1C 6.1 10/11/2009   MICROALBUR 1.6 08/09/2008    Assessment & Plan:

## 2010-12-19 ENCOUNTER — Encounter: Payer: Self-pay | Admitting: Internal Medicine

## 2010-12-19 NOTE — Assessment & Plan Note (Signed)
This has improved.

## 2010-12-19 NOTE — Assessment & Plan Note (Signed)
Start crestor 

## 2010-12-19 NOTE — Assessment & Plan Note (Signed)
This is well-controlled.

## 2010-12-19 NOTE — Assessment & Plan Note (Signed)
This has worsened in the setting of insomnia so she will continue [place] and I will ask her to start savella

## 2010-12-19 NOTE — Assessment & Plan Note (Signed)
I did some research today and found that this can be caused by DM as well as anxiety and B vitamin deficiencies so I have checked those levels today and have started her on Metanx, if anxiety appears to be a big problem for her then I will try klonopin as that has been shown to be beneficial

## 2010-12-19 NOTE — Assessment & Plan Note (Signed)
A1C level ordered, If needed I will start meds for DM

## 2010-12-22 ENCOUNTER — Encounter: Payer: Self-pay | Admitting: Internal Medicine

## 2010-12-23 LAB — VITAMIN B6: Vitamin B6: 4 ng/mL (ref 2.1–21.7)

## 2010-12-24 ENCOUNTER — Other Ambulatory Visit: Payer: Self-pay | Admitting: Internal Medicine

## 2010-12-24 DIAGNOSIS — K146 Glossodynia: Secondary | ICD-10-CM

## 2010-12-24 LAB — HM DEXA SCAN: HM Dexa Scan: NORMAL

## 2010-12-24 MED ORDER — VITAMIN B-1 100 MG PO TABS: 100.0000 mg | ORAL_TABLET | Freq: Every day | ORAL | Status: AC

## 2010-12-26 ENCOUNTER — Telehealth: Payer: Self-pay | Admitting: *Deleted

## 2010-12-26 NOTE — Telephone Encounter (Signed)
Patient requesting a call back - has questions about lab letter received.

## 2010-12-27 NOTE — Telephone Encounter (Signed)
Pt had questions on glucose, ALT and B1 on her labs. Pt advised that levels were in slightly elevated and did not cause concern for MD. Pt also advised that she was Rx'd medication to help with low B1 and she should take it as prescribed. Pt understood.

## 2010-12-27 NOTE — Telephone Encounter (Signed)
Left message on machine for pt to return my call  

## 2011-01-08 ENCOUNTER — Telehealth: Payer: Self-pay | Admitting: *Deleted

## 2011-01-08 DIAGNOSIS — E119 Type 2 diabetes mellitus without complications: Secondary | ICD-10-CM

## 2011-01-08 DIAGNOSIS — E785 Hyperlipidemia, unspecified: Secondary | ICD-10-CM

## 2011-01-08 NOTE — Telephone Encounter (Signed)
Pt was given samples of 2 meds at last OV, she would like RX's of same, OK?

## 2011-01-09 MED ORDER — ROSUVASTATIN CALCIUM 20 MG PO TABS: 20.0000 mg | ORAL_TABLET | Freq: Every day | ORAL | Status: DC

## 2011-01-09 NOTE — Telephone Encounter (Signed)
yes

## 2011-01-09 NOTE — Telephone Encounter (Signed)
[name] is other med? Should be 50 mg bid correct?

## 2011-01-09 NOTE — Telephone Encounter (Signed)
Crestor sent to pharmacy, lmovm for pt to call back, not sure what other medication is

## 2011-01-10 MED ORDER — MILNACIPRAN HCL 50 MG PO TABS: 50.0000 mg | ORAL_TABLET | Freq: Two times a day (BID) | ORAL | Status: DC

## 2011-01-10 NOTE — Telephone Encounter (Signed)
yes

## 2011-02-20 LAB — URINALYSIS, ROUTINE W REFLEX MICROSCOPIC
Glucose, UA: NEGATIVE
Nitrite: NEGATIVE
Protein, ur: NEGATIVE
Specific Gravity, Urine: 1.007
pH: 7

## 2011-02-20 LAB — CBC
HCT: 36.9
Hemoglobin: 13
MCHC: 35.1
Platelets: 267
RDW: 15.6 — ABNORMAL HIGH

## 2011-02-20 LAB — DIFFERENTIAL
Lymphocytes Relative: 44
Lymphs Abs: 2.5
Monocytes Absolute: 0.6
Monocytes Relative: 10
Neutro Abs: 2.5
Neutrophils Relative %: 45

## 2011-02-20 LAB — COMPREHENSIVE METABOLIC PANEL
Albumin: 4.3
Alkaline Phosphatase: 69
BUN: 11
Calcium: 9.7
Creatinine, Ser: 0.86
Glucose, Bld: 109 — ABNORMAL HIGH
Potassium: 3.8
Total Protein: 7.6

## 2011-02-20 LAB — URINE MICROSCOPIC-ADD ON

## 2011-02-21 LAB — CBC
MCHC: 35.6
RBC: 4.4
WBC: 4.9

## 2011-02-21 LAB — DIFFERENTIAL
Eosinophils Absolute: 0.1
Eosinophils Relative: 2
Lymphs Abs: 2.7
Monocytes Absolute: 0.5
Monocytes Relative: 11

## 2011-02-21 LAB — COMPREHENSIVE METABOLIC PANEL
ALT: 22
AST: 12
Albumin: 4
CO2: 32
Calcium: 10.1
Chloride: 100
GFR calc Af Amer: >60
GFR calc non Af Amer: >60
Sodium: 140

## 2011-03-11 ENCOUNTER — Telehealth: Payer: Self-pay | Admitting: *Deleted

## 2011-03-11 DIAGNOSIS — E119 Type 2 diabetes mellitus without complications: Secondary | ICD-10-CM

## 2011-03-11 DIAGNOSIS — E785 Hyperlipidemia, unspecified: Secondary | ICD-10-CM

## 2011-03-11 MED ORDER — ATORVASTATIN CALCIUM 80 MG PO TABS: 80.0000 mg | ORAL_TABLET | Freq: Every day | ORAL | Status: DC

## 2011-03-11 NOTE — Telephone Encounter (Signed)
Pt left vm - She says [facility] has tried x 3 wks to get a RF of crestor. Patient requesting a call back.

## 2011-03-11 NOTE — Telephone Encounter (Signed)
I have only received a fax from pharmacy dated 03/09/11 stating that insurance will not cover crestor 20mg  w/o a prior auth. Per fax, must call [phone] (ID# [id number]), Insurance request that pt must try /fail simvastatin,pravastatin, lovastatin or atorvastatin. Please advise thanks

## 2011-03-11 NOTE — Telephone Encounter (Signed)
done

## 2011-03-12 NOTE — Telephone Encounter (Signed)
Spoke with patient to advised of new Rx, per pt she tried and failed Lipitor through another MD in the past. I advised that I would call insurance to requesting a PA. Form received/completed and pending signature.

## 2011-03-13 LAB — COMPREHENSIVE METABOLIC PANEL
ALT: 22
AST: 14
Albumin: 4.2
CO2: 31
Chloride: 100
GFR calc Af Amer: >60
GFR calc non Af Amer: >60
Sodium: 139
Total Bilirubin: 1

## 2011-03-13 LAB — DIFFERENTIAL
Basophils Absolute: 0
Eosinophils Absolute: 0
Eosinophils Relative: 1
Lymphocytes Relative: 40
Lymphs Abs: 1.6
Monocytes Absolute: 0.5

## 2011-03-13 LAB — URINALYSIS, ROUTINE W REFLEX MICROSCOPIC
Bilirubin Urine: NEGATIVE
Glucose, UA: NEGATIVE
Ketones, ur: NEGATIVE
Protein, ur: NEGATIVE

## 2011-03-13 LAB — LIPASE, BLOOD: Lipase: 25

## 2011-03-13 LAB — CBC
Platelets: 281
RBC: 4.31
WBC: 4.1

## 2011-03-13 LAB — OCCULT BLOOD X 1 CARD TO LAB, STOOL: Fecal Occult Bld: NEGATIVE

## 2011-03-13 LAB — URINE MICROSCOPIC-ADD ON

## 2011-03-15 LAB — DIFFERENTIAL
Basophils Absolute: 0
Basophils Absolute: 0
Basophils Relative: 0
Eosinophils Absolute: 0.1
Eosinophils Absolute: 0.1
Eosinophils Relative: 1
Eosinophils Relative: 2
Lymphocytes Relative: 47 — ABNORMAL HIGH
Lymphs Abs: 3.1
Monocytes Absolute: 0.5
Neutrophils Relative %: 33 — ABNORMAL LOW

## 2011-03-15 LAB — I-STAT 8, (EC8 V) (CONVERTED LAB)
Acid-Base Excess: 6 — ABNORMAL HIGH
BUN: 15
Bicarbonate: 28.5 — ABNORMAL HIGH
Chloride: 102
Glucose, Bld: 142 — ABNORMAL HIGH
HCT: 38
Hemoglobin: 12.9
Operator id: 270651
Potassium: 3.6
Sodium: 136
TCO2: 30
pCO2, Ven: 33.5 — ABNORMAL LOW
pH, Ven: 7.538 — ABNORMAL HIGH

## 2011-03-15 LAB — CBC
HCT: 36.5
HCT: 35.7 — ABNORMAL LOW
Hemoglobin: 12.8
MCHC: 35.1
MCV: 84.7
MCV: 84.1
Platelets: 238
Platelets: 265
RDW: 14.8 — ABNORMAL HIGH
RDW: 14.8 — ABNORMAL HIGH
WBC: 5.8

## 2011-03-15 LAB — URINALYSIS, ROUTINE W REFLEX MICROSCOPIC
Glucose, UA: NEGATIVE
Ketones, ur: NEGATIVE
Leukocytes, UA: NEGATIVE
Nitrite: NEGATIVE
Protein, ur: NEGATIVE
Urobilinogen, UA: 0.2

## 2011-03-15 LAB — POCT CARDIAC MARKERS
CKMB, poc: <1 — ABNORMAL LOW
Myoglobin, poc: 63.3
Operator id: 284251
Troponin i, poc: <0.05

## 2011-03-15 LAB — POCT I-STAT CREATININE
Creatinine, Ser: 1
Operator id: 270651

## 2011-03-15 LAB — BASIC METABOLIC PANEL
BUN: 13
Chloride: 99
Creatinine, Ser: 0.79
Glucose, Bld: 108 — ABNORMAL HIGH
Potassium: 3.1 — ABNORMAL LOW

## 2011-03-15 LAB — HEPATIC FUNCTION PANEL
Alkaline Phosphatase: 64
Indirect Bilirubin: 0.6
Total Bilirubin: 0.7

## 2011-03-15 LAB — URINE MICROSCOPIC-ADD ON

## 2011-03-18 LAB — URINALYSIS, ROUTINE W REFLEX MICROSCOPIC
Glucose, UA: NEGATIVE
Leukocytes, UA: NEGATIVE
Protein, ur: NEGATIVE
Specific Gravity, Urine: 1.022
Urobilinogen, UA: 0.2

## 2011-03-18 LAB — URINE MICROSCOPIC-ADD ON

## 2011-03-18 LAB — COMPREHENSIVE METABOLIC PANEL
ALT: 28
AST: 17
Albumin: 3.8
Alkaline Phosphatase: 59
Chloride: 101
Creatinine, Ser: 0.81
GFR calc Af Amer: >60
Potassium: 3.3 — ABNORMAL LOW
Sodium: 138
Total Bilirubin: 0.6

## 2011-03-18 LAB — DIFFERENTIAL
Eosinophils Absolute: 0.1
Lymphs Abs: 4.1 — ABNORMAL HIGH
Monocytes Relative: 12 — ABNORMAL HIGH
Neutro Abs: 1.3 — ABNORMAL LOW
Neutrophils Relative %: 21 — ABNORMAL LOW

## 2011-03-18 LAB — CBC
Platelets: 243
WBC: 6.3

## 2011-03-18 NOTE — Telephone Encounter (Signed)
Medication approved 03/13/11-12/06/13

## 2011-04-24 ENCOUNTER — Ambulatory Visit: Payer: BC Managed Care – PPO | Admitting: Gastroenterology

## 2011-07-15 ENCOUNTER — Ambulatory Visit (INDEPENDENT_AMBULATORY_CARE_PROVIDER_SITE_OTHER): Payer: BC Managed Care – PPO | Admitting: Internal Medicine

## 2011-07-15 VITALS — BP 143/84 | HR 83 | Temp 98.8°F | Resp 16 | Ht 67.25 in | Wt 233.6 lb

## 2011-07-15 DIAGNOSIS — R300 Dysuria: Secondary | ICD-10-CM

## 2011-07-15 DIAGNOSIS — H6120 Impacted cerumen, unspecified ear: Secondary | ICD-10-CM

## 2011-07-15 DIAGNOSIS — R350 Frequency of micturition: Secondary | ICD-10-CM

## 2011-07-15 DIAGNOSIS — E119 Type 2 diabetes mellitus without complications: Secondary | ICD-10-CM

## 2011-07-15 DIAGNOSIS — M791 Myalgia, unspecified site: Secondary | ICD-10-CM

## 2011-07-15 DIAGNOSIS — IMO0001 Reserved for inherently not codable concepts without codable children: Secondary | ICD-10-CM

## 2011-07-15 LAB — POCT CBC
Granulocyte percent: 32.3 %G — AB (ref 37–80)
HCT, POC: 41.8 % (ref 37.7–47.9)
Hemoglobin: 13.8 g/dL (ref 12.2–16.2)
POC Granulocyte: 1.4 — AB (ref 2–6.9)
POC LYMPH PERCENT: 60.3 %L — AB (ref 10–50)
RBC: 4.77 M/uL (ref 4.04–5.48)
RDW, POC: 15.4 %

## 2011-07-15 LAB — POCT SEDIMENTATION RATE: POCT SED RATE: 26 mm/hr — AB (ref 0–22)

## 2011-07-15 LAB — POCT URINALYSIS DIPSTICK
Leukocytes, UA: NEGATIVE
Nitrite, UA: NEGATIVE
Urobilinogen, UA: 0.2
pH, UA: 7

## 2011-07-15 LAB — POCT UA - MICROSCOPIC ONLY
Casts, Ur, LPF, POC: NEGATIVE
Mucus, UA: NEGATIVE
Yeast, UA: NEGATIVE

## 2011-07-15 MED ORDER — METFORMIN HCL 500 MG PO TABS: 500.0000 mg | ORAL_TABLET | Freq: Two times a day (BID) | ORAL | Status: DC

## 2011-07-15 MED ORDER — POLYETHYLENE GLYCOL 3350 17 GM/SCOOP PO POWD: 17.0000 g | Freq: Two times a day (BID) | ORAL | Status: AC | PRN

## 2011-07-15 NOTE — Patient Instructions (Signed)
Take Metformin twice daily with food.  Miralax for constipation once daily.  Make appt with Dr. Russella Dar for colonscopy as we discussed.  Return to your primary doctor for follow-up on diabetes and hypertension.  Diabetes, Frequently Asked Questions WHAT IS DIABETES? Most of the food we eat is turned into glucose (sugar). Our bodies use it for energy. The pancreas makes a hormone called insulin. It helps glucose get into the cells of our bodies. When you have diabetes, your body either does not make enough insulin or cannot use its own insulin as well as it should. This causes sugars to build up in your blood. WHAT ARE THE SYMPTOMS OF DIABETES?  Frequent urination.   Excessive thirst.   Unexplained weight loss.   Extreme hunger.   Blurred vision.   Tingling or numbness in hands or feet.   Feeling very tired much of the time.   Dry, itchy skin.   Sores that are slow to heal.   Yeast infections.  WHAT ARE THE TYPES OF DIABETES? Type 1 Diabetes   About 10% of affected people have this type.   Usually occurs before the age of 38.   Usually occurs in thin to normal weight people.  Type 2 Diabetes  About 90% of affected people have this type.   Usually occurs after the age of 74.   Usually occurs in overweight people.   More likely to have:   A family history of diabetes.   A history of diabetes during pregnancy (gestational diabetes).   High blood pressure.   High cholesterol and triglycerides.  Gestational Diabetes  Occurs in about 4% of pregnancies.   Usually goes away after the baby is born.   More likely to occur in women with:   Family history of diabetes.   Previous gestational diabetes.   Obese.   Over 81 years old.  WHAT IS PRE-DIABETES? Pre-diabetes means your blood glucose is higher than normal, but lower than the diabetes range. It also means you are at risk of getting type 2 diabetes and heart disease. If you are told you have pre-diabetes, have  your blood glucose checked again in 1 to 2 years. WHAT IS THE TREATMENT FOR DIABETES? Treatment is aimed at keeping blood glucose near normal levels at all times. Learning how to manage this yourself is important in treating diabetes. Depending on the type of diabetes you have, your treatment will include one or more of the following:  Monitoring your blood glucose.   Meal planning.   Exercise.   Oral medicine (pills) or insulin.  CAN DIABETES BE PREVENTED? With type 1 diabetes, prevention is more difficult, because the triggers that cause it are not yet known. With type 2 diabetes, prevention is more likely, with lifestyle changes:  Maintain a healthy weight.   Eat healthy.   Exercise.  IS THERE A CURE FOR DIABETES? No, there is no cure for diabetes. There is a lot of research going on that is looking for a cure, and progress is being made. Diabetes can be treated and controlled. People with diabetes can manage their diabetes and lead normal, active lives. SHOULD I BE TESTED FOR DIABETES? If you are at least 57 years old, you should be tested for diabetes. You should be tested again every 3 years. If you are 45 or older and overweight, you may want to get tested more often. If you are younger than 45, overweight, and have one or more of the following risk factors, you  should be tested:  Family history of diabetes.   Inactive lifestyle.   High blood pressure.  WHAT ARE SOME OTHER SOURCES FOR INFORMATION ON DIABETES? The following organizations may help in your search for more information on diabetes: National Diabetes Education Program (NDEP) Internet: SolarDiscussions.es American Diabetes Association Internet: http://www.diabetes.org  Juvenile Diabetes Foundation International Internet: [employer].is Document Released: 05/23/2003 Document Revised: 01/30/2011 Document Reviewed: 03/17/2009 [facility] Patient Information 2012 [place], [place]. Diabetes and  Exercise Regular exercise is important and can help:   Control blood glucose (sugar).   Decrease blood pressure.    Control blood lipids (cholesterol, triglycerides).   Improve overall health.  BENEFITS FROM EXERCISE  Improved fitness.   Improved flexibility.   Improved endurance.   Increased bone density.   Weight control.   Increased muscle strength.   Decreased body fat.   Improvement of the body's use of insulin, a hormone.   Increased insulin sensitivity.   Reduction of insulin needs.   Reduced stress and tension.   Helps you feel better.  People with diabetes who add exercise to their lifestyle gain additional benefits, including:  Weight loss.   Reduced appetite.   Improvement of the body's use of blood glucose.   Decreased risk factors for heart disease:   Lowering of cholesterol and triglycerides.   Raising the level of good cholesterol (high-density lipoproteins, HDL).   Lowering blood sugar.   Decreased blood pressure.  TYPE 1 DIABETES AND EXERCISE  Exercise will usually lower your blood glucose.   If blood glucose is greater than 240 mg/dl, check urine ketones. If ketones are present, do not exercise.   Location of the insulin injection sites may need to be adjusted with exercise. Avoid injecting insulin into areas of the body that will be exercised. For example, avoid injecting insulin into:   The arms when playing tennis.   The legs when jogging. For more information, discuss this with your caregiver.   Keep a record of:   Food intake.   Type and amount of exercise.   Expected peak times of insulin action.   Blood glucose levels.  Do this before, during, and after exercise. Review your records with your caregiver. This will help you to develop guidelines for adjusting food intake and insulin amounts.  TYPE 2 DIABETES AND EXERCISE  Regular physical activity can help control blood glucose.   Exercise is important because it  may:   Increase the body's sensitivity to insulin.   Improve blood glucose control.   Exercise reduces the risk of heart disease. It decreases serum cholesterol and triglycerides. It also lowers blood pressure.   Those who take insulin or oral hypoglycemic agents should watch for signs of hypoglycemia. These signs include dizziness, shaking, sweating, chills, and confusion.   Body water is lost during exercise. It must be replaced. This will help to avoid loss of body fluids (dehydration) or heat stroke.  Be sure to talk to your caregiver before starting an exercise program to make sure it is safe for you. Remember, any activity is better than none.  Document Released: 08/10/2003 Document Revised: 01/30/2011 Document Reviewed: 11/24/2008 [facility] Patient Information 2012 [place], [place].

## 2011-07-15 NOTE — Progress Notes (Signed)
  Subjective:    Patient ID: [name], female    DOB: [birth year], 55 y.o.   MRN: [id number]  Otalgia  There is pain in the left ear. This is a new problem. The current episode started in the past 7 days. The problem occurs constantly. The problem has been unchanged. The pain is mild. Associated symptoms include abdominal pain. Pertinent negatives include no diarrhea or vomiting. She has tried nothing for the symptoms. There is no history of a tympanostomy tube.  Dysuria  This is a recurrent problem. The current episode started 1 to 4 weeks ago. The problem occurs intermittently. The problem has been waxing and waning. The quality of the pain is described as aching. The pain is mild. There has been no fever. There is no history of pyelonephritis. Pertinent negatives include no vomiting. She has tried nothing for the symptoms.  Arm Pain  The incident occurred more than 1 week ago. There was no injury mechanism. The pain is present in the upper left arm and left forearm. The quality of the pain is described as aching. The symptoms are aggravated by nothing. She has tried NSAIDs for the symptoms.      Review of Systems  Constitutional: Negative.   HENT: Positive for ear pain.   Eyes: Negative.   Respiratory: Negative.   Cardiovascular: Negative.   Gastrointestinal: Positive for abdominal pain and constipation. Negative for vomiting, diarrhea and anal bleeding.  Genitourinary: Positive for dysuria.  Musculoskeletal: Positive for arthralgias.  Skin: Negative.   Neurological: Negative.   Psychiatric/Behavioral: Negative.        Objective:   Physical Exam  Constitutional: She is oriented to person, place, and time. She appears well-developed.       Obese  HENT:  Head: Normocephalic.  Right Ear: External ear normal.       Left I\IAC occluded with cerumen.  Eyes: Conjunctivae are normal.  Neck: Neck supple.  Cardiovascular: Normal rate, regular rhythm and normal heart sounds.     Pulmonary/Chest: Effort normal and breath sounds normal.  Abdominal: Soft. Bowel sounds are normal. She exhibits no distension and no mass. There is no tenderness. There is no rebound and no guarding.  Musculoskeletal: Normal range of motion.  Neurological: She is alert and oriented to person, place, and time.  Skin: Skin is warm and dry.  Psychiatric: She has a normal mood and affect.          Assessment & Plan:  Early onset type 2 diabetes.  I believe her elevated sugar is causing her dry mouth and urinary frequency issues.  Will start Metformin BID and have her RTC here or her PCP in 3 months.  We will give her Miralax for her chronic constipation and ask her to follow-up with Dr. Anselm Jungling her gastroenterologist for repeat colonscopy.  She agrees to this.  BMT and ESR are pending at the completion of her OV today, will F/U if abnormals are present.

## 2011-07-16 LAB — BASIC METABOLIC PANEL
BUN: 13 mg/dL (ref 6–23)
CO2: 25 mEq/L (ref 19–32)
Calcium: 9.4 mg/dL (ref 8.4–10.5)
Creat: 0.71 mg/dL (ref 0.50–1.10)
Glucose, Bld: 89 mg/dL (ref 70–99)

## 2011-07-22 ENCOUNTER — Other Ambulatory Visit: Payer: Self-pay | Admitting: Internal Medicine

## 2011-09-09 ENCOUNTER — Ambulatory Visit (INDEPENDENT_AMBULATORY_CARE_PROVIDER_SITE_OTHER): Payer: BC Managed Care – PPO | Admitting: Internal Medicine

## 2011-09-09 ENCOUNTER — Other Ambulatory Visit (INDEPENDENT_AMBULATORY_CARE_PROVIDER_SITE_OTHER): Payer: BC Managed Care – PPO

## 2011-09-09 ENCOUNTER — Encounter: Payer: Self-pay | Admitting: Internal Medicine

## 2011-09-09 VITALS — BP 120/82 | HR 74 | Temp 98.2°F | Resp 16 | Wt 231.0 lb

## 2011-09-09 DIAGNOSIS — I10 Essential (primary) hypertension: Secondary | ICD-10-CM

## 2011-09-09 DIAGNOSIS — G4701 Insomnia due to medical condition: Secondary | ICD-10-CM | POA: Insufficient documentation

## 2011-09-09 DIAGNOSIS — E785 Hyperlipidemia, unspecified: Secondary | ICD-10-CM

## 2011-09-09 DIAGNOSIS — G8929 Other chronic pain: Secondary | ICD-10-CM | POA: Insufficient documentation

## 2011-09-09 DIAGNOSIS — E119 Type 2 diabetes mellitus without complications: Secondary | ICD-10-CM

## 2011-09-09 DIAGNOSIS — G4700 Insomnia, unspecified: Secondary | ICD-10-CM

## 2011-09-09 LAB — CBC WITH DIFFERENTIAL/PLATELET
Basophils Absolute: 0 10*3/uL (ref 0.0–0.1)
Eosinophils Absolute: 0.1 10*3/uL (ref 0.0–0.7)
HCT: 39.6 % (ref 36.0–46.0)
Hemoglobin: 13.8 g/dL (ref 12.0–15.0)
Lymphs Abs: 2.6 10*3/uL (ref 0.7–4.0)
MCHC: 34.7 g/dL (ref 30.0–36.0)
Monocytes Absolute: 0.6 10*3/uL (ref 0.1–1.0)
Neutro Abs: 2 10*3/uL (ref 1.4–7.7)
RDW: 14.6 % (ref 11.5–14.6)

## 2011-09-09 LAB — URINALYSIS, ROUTINE W REFLEX MICROSCOPIC
Bilirubin Urine: NEGATIVE
Leukocytes, UA: NEGATIVE
Nitrite: NEGATIVE
pH: 7.5 (ref 5.0–8.0)

## 2011-09-09 NOTE — Assessment & Plan Note (Signed)
She will stop metformin due to the upset stomach, I will check her a1c and will monitor her renal function today

## 2011-09-09 NOTE — Patient Instructions (Signed)

## 2011-09-09 NOTE — Assessment & Plan Note (Signed)
Due to the muscle aches I have asked her to stop the crestor, I will check her CPK CMP FLP today

## 2011-09-09 NOTE — Progress Notes (Signed)
Subjective:    Patient ID: [name], female    DOB: Apr 05, 1956, 56 y.o.   MRN: [id number]  Hypertension This is a chronic problem. The current episode started more than 1 year ago. The problem has been gradually improving since onset. The problem is controlled. Pertinent negatives include no anxiety, blurred vision, chest pain, headaches, malaise/fatigue, neck pain, orthopnea, palpitations, peripheral edema, PND, shortness of breath or sweats. Past treatments include diuretics. The current treatment provides moderate improvement. Compliance problems include exercise and diet.  There is no history of chronic renal disease.  Diabetes She presents for her follow-up diabetic visit. She has type 2 diabetes mellitus. There are no hypoglycemic associated symptoms. Pertinent negatives for hypoglycemia include no confusion, headaches, nervousness/anxiousness, pallor or sweats. Associated symptoms include fatigue. Pertinent negatives for diabetes include no blurred vision, no chest pain, no foot paresthesias, no foot ulcerations, no polydipsia, no polyphagia, no polyuria, no visual change, no weakness and no weight loss. There are no hypoglycemic complications. Symptoms are stable. There are no diabetic complications. Current diabetic treatment includes oral agent (monotherapy). She is compliant with treatment some of the time. Her weight is stable. She is following a generally healthy diet. Meal planning includes avoidance of concentrated sweets. There is no change in her home blood glucose trend. An ACE inhibitor/angiotensin II receptor blocker is not being taken. She does not see a podiatrist.Eye exam is not current.  Hyperlipidemia This is a chronic problem. The current episode started more than 1 year ago. The problem is controlled. Recent lipid tests were reviewed and are variable. Exacerbating diseases include diabetes and obesity. She has no history of chronic renal disease, hypothyroidism or liver  disease. Factors aggravating her hyperlipidemia include thiazides. Associated symptoms include myalgias ("left side of her body"). Pertinent negatives include no chest pain, focal sensory loss, focal weakness, leg pain or shortness of breath. Current antihyperlipidemic treatment includes statins. The current treatment provides moderate improvement of lipids. Compliance problems include adherence to exercise and adherence to diet.       Review of Systems  Constitutional: Positive for fatigue. Negative for fever, chills, weight loss, malaise/fatigue, diaphoresis, activity change, appetite change and unexpected weight change.  HENT: Negative.  Negative for neck pain.   Eyes: Negative.  Negative for blurred vision.  Respiratory: Negative for apnea, cough, choking, chest tightness, shortness of breath, wheezing and stridor.   Cardiovascular: Negative for chest pain, palpitations, orthopnea, leg swelling and PND.  Gastrointestinal: Positive for abdominal pain (for 2 months, started with metformin therapy) and constipation. Negative for nausea, vomiting, diarrhea, blood in stool, abdominal distention, anal bleeding and rectal pain.  Genitourinary: Negative.  Negative for polyuria.  Musculoskeletal: Positive for myalgias ("left side of her body"). Negative for back pain, joint swelling, arthralgias and gait problem.  Skin: Negative for color change, pallor, rash and wound.  Neurological: Negative.  Negative for focal weakness, weakness and headaches.  Hematological: Negative for polydipsia, polyphagia and adenopathy. Does not bruise/bleed easily.  Psychiatric/Behavioral: Positive for sleep disturbance and dysphoric mood. Negative for suicidal ideas, hallucinations, behavioral problems, confusion, self-injury, decreased concentration and agitation. The patient is not nervous/anxious and is not hyperactive.        Objective:   Physical Exam  Vitals reviewed. Constitutional: She is oriented to person,  place, and time. She appears well-developed and well-nourished. No distress.  HENT:  Head: Normocephalic and atraumatic.  Mouth/Throat: Oropharynx is clear and moist. No oropharyngeal exudate.  Eyes: Conjunctivae are normal. Right eye exhibits no discharge.  Left eye exhibits no discharge. No scleral icterus.  Neck: Neck supple. No JVD present. No tracheal deviation present. No thyromegaly present.  Cardiovascular: Normal rate, regular rhythm, normal heart sounds and intact distal pulses.  Exam reveals no gallop and no friction rub.   No murmur heard. Pulmonary/Chest: Effort normal and breath sounds normal. No stridor. No respiratory distress. She has no wheezes. She has no rales. She exhibits no tenderness.  Abdominal: Soft. Bowel sounds are normal. She exhibits no distension and no mass. There is no tenderness. There is no rebound and no guarding.  Musculoskeletal: Normal range of motion. She exhibits no edema and no tenderness.  Lymphadenopathy:    She has no cervical adenopathy.  Neurological: She is oriented to person, place, and time.  Skin: Skin is warm and dry. No rash noted. She is not diaphoretic. No erythema. No pallor.  Psychiatric: She has a normal mood and affect. Her behavior is normal. Judgment and thought content normal.          Assessment & Plan:

## 2011-09-09 NOTE — Assessment & Plan Note (Signed)
Her BP is well controlled, I will check her lytes and renal function today 

## 2011-09-09 NOTE — Assessment & Plan Note (Signed)
I have asked her to have a sleep consult

## 2011-09-10 LAB — COMPREHENSIVE METABOLIC PANEL
ALT: 30 U/L (ref 0–35)
Alkaline Phosphatase: 56 U/L (ref 39–117)
CO2: 32 mEq/L (ref 19–32)
Potassium: 4.6 mEq/L (ref 3.5–5.1)
Sodium: 143 mEq/L (ref 135–145)
Total Bilirubin: 0.1 mg/dL — ABNORMAL LOW (ref 0.3–1.2)
Total Protein: 7.9 g/dL (ref 6.0–8.3)

## 2011-09-10 LAB — LIPID PANEL
HDL: 54 mg/dL (ref >39.00)
Total CHOL/HDL Ratio: 4
VLDL: 29.8 mg/dL (ref 0.0–40.0)

## 2011-09-12 ENCOUNTER — Encounter: Payer: Self-pay | Admitting: Internal Medicine

## 2011-09-13 ENCOUNTER — Ambulatory Visit (INDEPENDENT_AMBULATORY_CARE_PROVIDER_SITE_OTHER): Payer: BC Managed Care – PPO | Admitting: Endocrinology

## 2011-09-13 ENCOUNTER — Ambulatory Visit: Payer: BC Managed Care – PPO

## 2011-09-13 ENCOUNTER — Encounter: Payer: Self-pay | Admitting: Endocrinology

## 2011-09-13 VITALS — BP 118/88 | HR 77 | Temp 98.2°F | Ht 68.0 in | Wt 234.0 lb

## 2011-09-13 DIAGNOSIS — R51 Headache: Secondary | ICD-10-CM

## 2011-09-13 MED ORDER — HYDROCODONE-ACETAMINOPHEN 10-325 MG PO TABS: 1.0000 | ORAL_TABLET | ORAL | Status: DC | PRN

## 2011-09-13 MED ORDER — CEFUROXIME AXETIL 250 MG PO TABS: 250.0000 mg | ORAL_TABLET | Freq: Two times a day (BID) | ORAL | Status: AC

## 2011-09-13 MED ORDER — VALACYCLOVIR HCL 1 G PO TABS: 1000.0000 mg | ORAL_TABLET | Freq: Three times a day (TID) | ORAL | Status: DC

## 2011-09-13 NOTE — Patient Instructions (Addendum)
Here are 3 prescriptions: pain, antibiotic, and against shingles. I hope you feel better soon.  If you don't feel better by next week, please call back.

## 2011-09-13 NOTE — Progress Notes (Signed)
Subjective:    Patient ID: [name], female    DOB: 03-Sep-1955, 56 y.o.   MRN: [id number]  HPI Pt states few days of moderate pain at the entire left side of the head--worst at the left ear.  No assoc fever. Past Medical History  Diagnosis Date  . GERD (gastroesophageal reflux disease)   . Hyperlipidemia   . Hyperthyroidism   . Benign fundic gland polyps of stomach   . IBS (irritable bowel syndrome)   . Arthritis   . Fibromyalgia   . Fatty liver     Past Surgical History  Procedure Date  . Cholecystectomy 2009    low GB EF, no gallstones  . Vaginal hysterectomy   . Laparoscopy     of adhesions    History   Social History  . Marital Status: Married    Spouse Name: N/A    Number of Children: N/A  . Years of Education: N/A   Occupational History  . Financial SVCS at a [employer]    Social History Main Topics  . Smoking status: Former Smoker    Types: Cigarettes    Quit date: 07/14/1981  . Smokeless tobacco: Never Used  . Alcohol Use: No  . Drug Use: No  . Sexually Active: Not Currently   Other Topics Concern  . Not on file   Social History Narrative  . No narrative on file    Current Outpatient Prescriptions on File Prior to Visit  Medication Sig Dispense Refill  . calcium carbonate (TUMS) 500 MG chewable tablet Chew 1 tablet by mouth daily.        . hydrochlorothiazide (HYDRODIURIL) 25 MG tablet TAKE 1 TABLET BY MOUTH EVERY MORNING  30 tablet  5  . hyoscyamine (LEVBID) 0.375 MG 12 hr tablet Take 1 tablet (0.375 mg total) by mouth 2 (two) times daily as needed for cramping (abdominal pain).  60 tablet  5  . l-methylfolate-B6-B12 (METANX) 3-35-2 MG TABS Take 1 tablet by mouth 2 (two) times daily.  60 tablet  0  . omeprazole (PRILOSEC) 40 MG capsule Take 40 mg by mouth daily.        . Probiotic Product (PROBIOTIC PO) Take 3 tablets by mouth daily.        . Thiamine HCl (VITAMIN B-1) 100 MG tablet Take 1 tablet (100 mg total) by mouth daily.  90 tablet   3  . zolpidem (AMBIEN) 10 MG tablet Take 10 mg by mouth at bedtime as needed.        Allergies  Allergen Reactions  . Crestor (Rosuvastatin Calcium)     Muscle aches  . Metformin And Related     abd pain    Family History  Problem Relation Age of Onset  . Colon cancer Father   . Hypertension    . Heart disease    . Diabetes Brother     Half  . Heart disease Brother   . Heart disease Mother   . Diabetes Sister    BP 118/88  Pulse 77  Temp(Src) 98.2 F (36.8 C) (Oral)  Ht 5\' 8"  (1.727 m)  Wt 234 lb (106.142 kg)  BMI 35.58 kg/m2  SpO2 97%  Review of Systems Denies visual loss and rash    Objective:   Physical Exam VITAL SIGNS:  See vs page GENERAL: no distress head: no deformity.  No rash eyes: no periorbital swelling, no proptosis external nose and ears are normal mouth: no lesion seen Left tm is red.  Right is normal     Assessment & Plan:  Pain at the left side of the head, new.  there is a high risk of zoster

## 2011-09-24 ENCOUNTER — Other Ambulatory Visit: Payer: Self-pay | Admitting: Endocrinology

## 2011-09-30 ENCOUNTER — Ambulatory Visit (INDEPENDENT_AMBULATORY_CARE_PROVIDER_SITE_OTHER): Payer: BC Managed Care – PPO | Admitting: Pulmonary Disease

## 2011-09-30 ENCOUNTER — Encounter: Payer: Self-pay | Admitting: Pulmonary Disease

## 2011-09-30 VITALS — BP 104/68 | HR 73 | Temp 98.3°F | Ht 68.0 in | Wt 226.0 lb

## 2011-09-30 DIAGNOSIS — G4700 Insomnia, unspecified: Secondary | ICD-10-CM | POA: Insufficient documentation

## 2011-09-30 MED ORDER — TRAZODONE HCL 50 MG PO TABS: 50.0000 mg | ORAL_TABLET | Freq: Every day | ORAL | Status: DC

## 2011-09-30 NOTE — Progress Notes (Signed)
Addended by: Salli Quarry on: 09/30/2011 04:00 PM   Modules accepted: Orders

## 2011-09-30 NOTE — Assessment & Plan Note (Signed)
The patient has a long-standing issue with sleep onset and maintenance of that I suspect is multifactorial.  She clearly has sleep hygiene issues, a component of insomnia, chronic pain which is disrupting her sleep, and may or may not have a sleep disorder such as sleep apnea or periodic limb movement that is contributing.  At this point, we need to focus on getting her sleeping longer through the night, and then if she continues to have non-restorative sleep, can evaluate with a sleep study.  I have reviewed sleep hygiene with her in detail, as well as stimulus control therapy.  I have explained to her that long-term use of chronic sleep aids is not the answer, and I would rather get her off Ambien if at all possible.  She may ultimately need referral to psychiatry or psychology for cognitive behavioral therapy.  I will try her on trazodone just for 3 weeks only, since it is non-habit forming and does not have tachyphylaxis.

## 2011-09-30 NOTE — Progress Notes (Signed)
  Subjective:    Patient ID: [name], female    DOB: 1955/12/29, 56 y.o.   MRN: [id number]  HPI The patient is a 56 year old female who I am asked to see for ongoing sleeping issues.  The patient states for about 4 years she has had an issue of initiating and maintaining sleep, and feels it is getting worse.  She sleeps in separate beds from her family member, and is unsure if she snores or if she kicks a lot during the night.  She denies any symptoms of restless leg syndrome.  She will typically tried to get a bed at 8:30 in the evening, and will typically read in bed but not watch television.  It takes her a prolonged period of time to go to sleep even with Ambien, but it will take over 2 hours without medication.  She will typically awaken 2-3 times a night, and she thinks this is because of chronic pain and also needing to go to the bathroom.  She may go back to sleep quickly, or may take as long as 2 hours.  She typically will get up between 4 and 5 AM because she simply cannot get back to sleep.  She would like to sleep as late as 6 AM if possible.  She does believe that chronic pain is a big issue for her.  She will typically staying in bed when she cannot sleep, but will sometimes get up and go into another room to walk around.  The patient has zero caffeine intake, but takes a nap at lunch every day. She has significant sleepiness in the late afternoon and while at work.  Her Epworth sleepiness score today is 9, and she states that her weight is up 30 pounds over the last 2 years.   Review of Systems  Constitutional: Negative for fever.  HENT: Positive for ear pain. Negative for nosebleeds, congestion, sore throat, rhinorrhea, sneezing, trouble swallowing, dental problem, postnasal drip and sinus pressure.   Eyes: Negative for redness and itching.  Respiratory: Negative for cough, chest tightness, shortness of breath and wheezing.   Cardiovascular: Negative for palpitations and leg  swelling.  Gastrointestinal: Positive for abdominal pain. Negative for nausea and vomiting.  Genitourinary: Negative for dysuria.  Musculoskeletal: Negative for joint swelling.  Skin: Negative for rash.  Neurological: Positive for headaches.  Hematological: Does not bruise/bleed easily.  Psychiatric/Behavioral: Negative for dysphoric mood. The patient is not nervous/anxious.        Objective:   Physical Exam Constitutional:  Overweight female, no acute distress  HENT:  Nares patent without discharge, but large turbinates.  Oropharynx without exudate, palate and uvula are mildly elongated, mild tonsillar hypertrophy  Eyes:  Perrla, eomi, no scleral icterus  Neck:  No JVD, no TMG  Cardiovascular:  Normal rate, regular rhythm, no rubs or gallops.  No murmurs        Intact distal pulses  Pulmonary :  Normal breath sounds, no stridor or respiratory distress   No rales, rhonchi, or wheezing  Abdominal:  Soft, nondistended, bowel sounds present.  No tenderness noted.   Musculoskeletal:  mild lower extremity edema noted.  Lymph Nodes:  No cervical lymphadenopathy noted  Skin:  No cyanosis noted  Neurologic:  Appears sleepy, appropriate, moves all 4 extremities without obvious deficit.         Assessment & Plan:

## 2011-09-30 NOTE — Patient Instructions (Signed)
Stop [employer] trazodone 50mg  one each night around 9pm Change bedtime to no earlier than 10pm, and do not start your day until 6am. No tv or reading while in bed. Do not stay in bed more than if you cannot initiate sleep.  Go to family room to read or watch tv, no eating or drinking, no activity. No computer after 10pm at night. Discuss with your primary md ideas for better pain control. NO napping during day at all.  Exercise about 4 hrs before bedtime will be helpful. If you are sleeping longer, but still not having restorative sleep, can consider a sleep study to evaluate for sleep apnea or limb movements.  followup with me in 3 weeks.

## 2011-10-15 ENCOUNTER — Ambulatory Visit: Payer: BC Managed Care – PPO | Admitting: Internal Medicine

## 2011-10-21 ENCOUNTER — Encounter: Payer: Self-pay | Admitting: Pulmonary Disease

## 2011-10-21 ENCOUNTER — Ambulatory Visit (INDEPENDENT_AMBULATORY_CARE_PROVIDER_SITE_OTHER): Payer: BC Managed Care – PPO | Admitting: Pulmonary Disease

## 2011-10-21 VITALS — BP 118/78 | HR 75 | Temp 97.7°F | Ht 68.0 in | Wt 237.4 lb

## 2011-10-21 DIAGNOSIS — G4700 Insomnia, unspecified: Secondary | ICD-10-CM

## 2011-10-21 NOTE — Assessment & Plan Note (Signed)
The patient has ongoing sleep issues with sleep maintenance, but is a little bit better after trying behavioral therapies and improvement in sleep hygiene.  She is not taking Ambien at this time, and only had a partial response to trazodone.  After our prior discussion, she has really focused on what seems to be disturbing her sleep.  She feels very strongly nail that chronic pain is causing her awakenings and limiting her return to sleep.  I have asked her to continue with her behavioral therapies, but also to speak with her primary care physician or orthopedic about further treatment of her chronic pain.  I have also encouraged her to work aggressively on weight loss and some type of exercise program.

## 2011-10-21 NOTE — Patient Instructions (Signed)
Continue to stay off ambien, and work on behavioral therapies and sleep hygiene as we discussed last visit.  Speak with your primary md about further work up of your chronic pain Work on weight loss and exercise program followup with me as needed.

## 2011-10-21 NOTE — Progress Notes (Signed)
  Subjective:    Patient ID: [name], female    DOB: 1955/07/25, 56 y.o.   MRN: [id number]  HPI The patient comes in today for followup of her sleeping issues.  At the last visit, I felt she had sleep disruption related to chronic pain, poor sleep hygiene, and could not exclude an underlying sleep disorder.  I have asked her to get off Ambien by using trazodone at bedtime, and also stressed to her various behavioral therapies to improve sleep hygiene.  The patient comes in today where she is not awakening as much, and is getting back to sleep quicker, but continues to have issues.  She feels that chronic pain is a major issue for her, and is responsible for her awakenings.   Review of Systems  Constitutional: Negative.  Negative for fever and unexpected weight change.  HENT: Negative for ear pain, nosebleeds, congestion, sore throat, rhinorrhea, sneezing, trouble swallowing, dental problem, postnasal drip and sinus pressure.   Eyes: Negative.  Negative for redness and itching.  Respiratory: Negative.  Negative for cough, chest tightness, shortness of breath and wheezing.   Cardiovascular: Positive for leg swelling. Negative for palpitations.  Gastrointestinal: Negative.  Negative for nausea and vomiting.  Genitourinary: Negative.  Negative for dysuria.  Musculoskeletal: Positive for joint swelling.  Skin: Negative.  Negative for rash.  Neurological: Positive for headaches.  Hematological: Negative.  Does not bruise/bleed easily.  Psychiatric/Behavioral: Negative.  Negative for dysphoric mood. The patient is not nervous/anxious.        Objective:   Physical Exam Obese female in no acute distress Nose without purulence or discharge noted Lower extremities without significant edema Appears mildly sleepy, moves all 4 extremities.       Assessment & Plan:

## 2011-10-22 ENCOUNTER — Telehealth: Payer: Self-pay | Admitting: Gastroenterology

## 2011-10-22 NOTE — Telephone Encounter (Signed)
I agree. She needs to see her PCP first and then me.

## 2011-10-22 NOTE — Telephone Encounter (Signed)
Patient c/o lightheaded , dizziness, low back pain, leg pain, and nausea.  She wants to see Dr Russella Dar today for these symptoms.  I have recommended that she see her primary care MD.  She says she has seen him recently, but the last office visit I see is April.  She also wants to discuss a colonoscopy.  I have scheduled her for 11/04/11 and recommended she see her primary care for her current complaints.

## 2011-11-01 ENCOUNTER — Other Ambulatory Visit: Payer: Self-pay | Admitting: Gastroenterology

## 2011-11-02 ENCOUNTER — Other Ambulatory Visit: Payer: Self-pay | Admitting: Pulmonary Disease

## 2011-11-04 ENCOUNTER — Encounter: Payer: Self-pay | Admitting: Internal Medicine

## 2011-11-04 ENCOUNTER — Ambulatory Visit (INDEPENDENT_AMBULATORY_CARE_PROVIDER_SITE_OTHER): Payer: BC Managed Care – PPO | Admitting: Gastroenterology

## 2011-11-04 ENCOUNTER — Encounter: Payer: Self-pay | Admitting: Gastroenterology

## 2011-11-04 ENCOUNTER — Other Ambulatory Visit: Payer: Self-pay | Admitting: Gastroenterology

## 2011-11-04 VITALS — BP 134/76 | HR 68 | Ht 68.0 in | Wt 240.0 lb

## 2011-11-04 DIAGNOSIS — Z800 Family history of malignant neoplasm of digestive organs: Secondary | ICD-10-CM

## 2011-11-04 DIAGNOSIS — K5900 Constipation, unspecified: Secondary | ICD-10-CM

## 2011-11-04 DIAGNOSIS — K219 Gastro-esophageal reflux disease without esophagitis: Secondary | ICD-10-CM

## 2011-11-04 MED ORDER — SIMETHICONE 80 MG PO CHEW: 80.0000 mg | CHEWABLE_TABLET | Freq: Four times a day (QID) | ORAL | Status: DC | PRN

## 2011-11-04 MED ORDER — RANITIDINE HCL 150 MG PO TABS: 150.0000 mg | ORAL_TABLET | Freq: Two times a day (BID) | ORAL | Status: DC

## 2011-11-04 MED ORDER — PEG-KCL-NACL-NASULF-NA ASC-C 100 G PO SOLR: 1.0000 | Freq: Once | ORAL | Status: DC

## 2011-11-04 NOTE — Telephone Encounter (Signed)
Per KC, okay to fill for a 1 month supply but he does not want her to take the Trazodone long term. She needs to follow-up with her PCP or Orthopedic physician regarding the chronic pain she has because this is a contributing factor to her sleep. LMOMTCB x 1.

## 2011-11-04 NOTE — Progress Notes (Signed)
History of Present Illness: This is a 56 year old female with epigastric fullness following meals, gas, bloating, constipation. She take MOM as needed and TUMS as needed. 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 Denies weight loss, abdominal pain, diarrhea, change in stool caliber, melena, hematochezia, nausea, vomiting, dysphagia, chest pain.  Current Medications, Allergies, Past Medical History, Past Surgical History, Family History and Social History were reviewed in [employer] record.  Physical Exam: General: Well developed , well nourished, no acute distress Head: Normocephalic and atraumatic Eyes:  sclerae anicteric, EOMI Ears: Normal auditory acuity Mouth: No deformity or lesions Lungs: Clear throughout to auscultation Heart: Regular rate and rhythm; no murmurs, rubs or bruits Abdomen: Soft, non tender and non distended. No masses, hepatosplenomegaly or hernias noted. Normal Bowel sounds Musculoskeletal: Symmetrical with no gross deformities  Pulses:  Normal pulses noted Extremities: No clubbing, cyanosis, edema or deformities noted Neurological: Alert oriented x 4, grossly nonfocal Psychological:  Alert and cooperative. Normal mood and affect  Assessment and Recommendations:  1. Constipation, GERD, gas, bloating-all chronic complaints. She is unwilling to take a PPI given prior gastric polyps. Begin Zantac 150 mg po qd to bid and standard antireflux measures. TUMS as needed. MOM as needed with a high fiber diet. Gas-X tid as needed.  2. Family history of colon cancer in her father. The risks, benefits, and alternatives to colonoscopy with possible biopsy and possible polypectomy were discussed with the patient and they consent to proceed.

## 2011-11-04 NOTE — Patient Instructions (Addendum)
You have been scheduled for a colonoscopy with propofol. Please follow written instructions given to you at your visit today.  Please pick up your prep kit at the pharmacy within the next 1-3 days.  We have sent the following medications to your pharmacy for you to pick up at your convenience: Moviprep; you were given instructions at your visit today.  You can get Zantac 150mg  over the counter, can be take once a day or twice a day.  Gas-X three times a day as needed  You have been given a anti-reflux hand out

## 2011-11-07 NOTE — Telephone Encounter (Signed)
LMOMTCB x1 for pt 

## 2011-11-08 ENCOUNTER — Ambulatory Visit (INDEPENDENT_AMBULATORY_CARE_PROVIDER_SITE_OTHER)
Admission: RE | Admit: 2011-11-08 | Discharge: 2011-11-08 | Disposition: A | Discharge status: Home / Self Care | Payer: BC Managed Care – PPO | Source: Ambulatory Visit | Attending: Internal Medicine | Admitting: Internal Medicine

## 2011-11-08 ENCOUNTER — Ambulatory Visit (INDEPENDENT_AMBULATORY_CARE_PROVIDER_SITE_OTHER): Payer: BC Managed Care – PPO | Admitting: Internal Medicine

## 2011-11-08 ENCOUNTER — Encounter: Payer: Self-pay | Admitting: Internal Medicine

## 2011-11-08 VITALS — BP 122/80 | HR 72 | Temp 98.0°F | Resp 16 | Wt 236.0 lb

## 2011-11-08 DIAGNOSIS — E119 Type 2 diabetes mellitus without complications: Secondary | ICD-10-CM

## 2011-11-08 DIAGNOSIS — M25561 Pain in right knee: Secondary | ICD-10-CM

## 2011-11-08 DIAGNOSIS — M179 Osteoarthritis of knee, unspecified: Secondary | ICD-10-CM | POA: Insufficient documentation

## 2011-11-08 DIAGNOSIS — I10 Essential (primary) hypertension: Secondary | ICD-10-CM

## 2011-11-08 DIAGNOSIS — M25569 Pain in unspecified knee: Secondary | ICD-10-CM

## 2011-11-08 DIAGNOSIS — M1710 Unilateral primary osteoarthritis, unspecified knee: Secondary | ICD-10-CM | POA: Insufficient documentation

## 2011-11-08 DIAGNOSIS — Z23 Encounter for immunization: Secondary | ICD-10-CM

## 2011-11-08 DIAGNOSIS — IMO0002 Reserved for concepts with insufficient information to code with codable children: Secondary | ICD-10-CM

## 2011-11-08 MED ORDER — FLAVOCOXID-CIT ZN BISGLCINATE 500-50 MG PO CAPS: 1.0000 | ORAL_CAPSULE | Freq: Two times a day (BID) | ORAL | Status: DC

## 2011-11-08 MED ORDER — CELECOXIB 200 MG PO CAPS: 200.0000 mg | ORAL_CAPSULE | Freq: Every day | ORAL | Status: AC

## 2011-11-08 NOTE — Patient Instructions (Signed)
Degenerative Arthritis  You have osteoarthritis. This is the wear and tear arthritis that comes with aging. It is also called degenerative arthritis. This is common in people past middle age. It is caused by stress on the joints. The large weight bearing joints of the lower extremities are most often affected. The knees, hips, back, neck, and hands can become painful, swollen, and stiff. This is the most common type of arthritis. It comes on with age, carrying too much weight, or from an injury.  Treatment includes resting the sore joint until the pain and swelling improve. Crutches or a walker may be needed for severe flares. Only take over-the-counter or prescription medicines for pain, discomfort, or fever as directed by your caregiver. Local heat therapy may improve motion. Cortisone shots into the joint are sometimes used to reduce pain and swelling during flares.  Osteoarthritis is usually not crippling and progresses slowly. There are things you can do to decrease pain:  · Avoid high impact activities.  · Exercise regularly.  · Low impact exercises such as walking, biking and swimming help to keep the muscles strong and keep normal joint function.  · Stretching helps to keep your range of motion.  · Lose weight if you are overweight. This reduces joint stress.  In severe cases when you have pain at rest or increasing disability, joint surgery may be helpful. See your caregiver for follow-up treatment as recommended.   SEEK IMMEDIATE MEDICAL CARE IF:   · You have severe joint pain.  · Marked swelling and redness in your joint develops.  · You develop a high fever.  Document Released: 05/20/2005 Document Revised: 05/09/2011 Document Reviewed: 10/20/2006  ExitCare® Patient Information ©2012 [employer].

## 2011-11-08 NOTE — Assessment & Plan Note (Signed)
She was offered treatment options today with PT and meds

## 2011-11-08 NOTE — Assessment & Plan Note (Signed)
Last a1c was low so she will stay off of meds for now

## 2011-11-08 NOTE — Progress Notes (Signed)
Subjective:    Patient ID: [name], female    DOB: 07/03/1955, 56 y.o.   MRN: [id number]  Arthritis Presents for initial visit. The disease course has been worsening. The condition has lasted for 3 weeks. She complains of pain, stiffness and joint swelling. She reports no joint warmth. Affected locations include the right knee. Her pain is at a severity of 3/10. Pertinent negatives include no diarrhea, fatigue, fever, pain at night, pain while resting, rash or weight loss. Her past medical history is significant for osteoarthritis. Past treatments include acetaminophen. The treatment provided mild relief. Factors aggravating her arthritis include activity. Compliance with prior treatments has been good.      Review of Systems  Constitutional: Negative for fever, chills, weight loss, diaphoresis, activity change, appetite change, fatigue and unexpected weight change.  HENT: Negative.   Eyes: Negative.   Respiratory: Negative for apnea, cough, chest tightness, shortness of breath, wheezing and stridor.   Cardiovascular: Negative for chest pain, palpitations and leg swelling.  Gastrointestinal: Negative for nausea, vomiting, abdominal pain, diarrhea, constipation, blood in stool and anal bleeding.  Genitourinary: Negative.   Musculoskeletal: Positive for joint swelling, arthralgias (knees), arthritis and stiffness. Negative for myalgias, back pain and gait problem.  Skin: Negative for color change, pallor, rash and wound.  Neurological: Negative.   Hematological: Negative for adenopathy. Does not bruise/bleed easily.  Psychiatric/Behavioral: Negative.        Objective:   Physical Exam  Vitals reviewed. Constitutional: She is oriented to person, place, and time. She appears well-developed and well-nourished. No distress.  HENT:  Head: Normocephalic and atraumatic.  Mouth/Throat: No oropharyngeal exudate.  Eyes: Conjunctivae are normal. Right eye exhibits no discharge. Left eye  exhibits no discharge. No scleral icterus.  Neck: Normal range of motion. Neck supple. No JVD present. No tracheal deviation present. No thyromegaly present.  Cardiovascular: Normal rate, regular rhythm, normal heart sounds and intact distal pulses.  Exam reveals no gallop and no friction rub.   No murmur heard. Pulmonary/Chest: Effort normal and breath sounds normal. No stridor. No respiratory distress. She has no wheezes. She has no rales. She exhibits no tenderness.  Abdominal: Soft. Bowel sounds are normal. She exhibits no distension. There is no tenderness. There is no rebound and no guarding.  Musculoskeletal: Normal range of motion. She exhibits no edema and no tenderness.       Right knee: She exhibits deformity (DJD changes). She exhibits normal range of motion, no swelling, no effusion, no ecchymosis, no laceration, no erythema, normal alignment, no LCL laxity, normal patellar mobility, no bony tenderness, normal meniscus and no MCL laxity. no tenderness found.  Lymphadenopathy:    She has no cervical adenopathy.  Neurological: She is oriented to person, place, and time.  Skin: Skin is warm and dry. No rash noted. She is not diaphoretic. No erythema. No pallor.  Psychiatric: She has a normal mood and affect. Her behavior is normal. Judgment and thought content normal.      Lab Results  Component Value Date   WBC 5.4 09/09/2011   HGB 13.8 09/09/2011   HCT 39.6 09/09/2011   PLT 225.0 09/09/2011   GLUCOSE 125* 09/09/2011   CHOL 191 09/09/2011   TRIG 149.0 09/09/2011   HDL 54.00 09/09/2011   LDLCALC 107* 09/09/2011   ALT 30 09/09/2011   AST 14 09/09/2011   NA 143 09/09/2011   K 4.6 09/09/2011   CL 102 09/09/2011   CREATININE 0.9 09/09/2011   BUN 17 09/09/2011  CO2 32 09/09/2011   TSH 0.57 09/09/2011   HGBA1C 5.9 09/09/2011   MICROALBUR 1.6 08/09/2008      Assessment & Plan:

## 2011-11-08 NOTE — Progress Notes (Signed)
Addended by: Merrilyn Puma on: 11/08/2011 11:02 AM   Modules accepted: Orders

## 2011-11-08 NOTE — Assessment & Plan Note (Signed)
Her BP is well controlled 

## 2011-11-08 NOTE — Assessment & Plan Note (Signed)
I will check an xray of her knee to gauge the severity of the DJD

## 2011-11-11 NOTE — Telephone Encounter (Signed)
LMOMTCB x1 for pt 

## 2011-11-15 NOTE — Telephone Encounter (Signed)
PAtient has not returned any phone calls and medication is being denied until she contacts the office.

## 2011-11-27 ENCOUNTER — Ambulatory Visit (AMBULATORY_SURGERY_CENTER): Payer: BC Managed Care – PPO | Admitting: Gastroenterology

## 2011-11-27 ENCOUNTER — Encounter: Payer: Self-pay | Admitting: Gastroenterology

## 2011-11-27 VITALS — BP 132/92 | HR 69 | Temp 96.9°F | Resp 18 | Ht 68.0 in | Wt 240.0 lb

## 2011-11-27 DIAGNOSIS — K219 Gastro-esophageal reflux disease without esophagitis: Secondary | ICD-10-CM

## 2011-11-27 DIAGNOSIS — Z1211 Encounter for screening for malignant neoplasm of colon: Secondary | ICD-10-CM

## 2011-11-27 DIAGNOSIS — Z800 Family history of malignant neoplasm of digestive organs: Secondary | ICD-10-CM

## 2011-11-27 DIAGNOSIS — K5900 Constipation, unspecified: Secondary | ICD-10-CM

## 2011-11-27 MED ORDER — SODIUM CHLORIDE 0.9 % IV SOLN: 500.0000 mL | INTRAVENOUS | Status: DC

## 2011-11-27 NOTE — Patient Instructions (Addendum)
YOU HAD AN ENDOSCOPIC PROCEDURE TODAY AT THE  ENDOSCOPY CENTER: Refer to the procedure report that was given to you for any specific questions about what was found during the examination.  If the procedure report does not answer your questions, please call your gastroenterologist to clarify.  If you requested that your care partner not be given the details of your procedure findings, then the procedure report has been included in a sealed envelope for you to review at your convenience later.  YOU SHOULD EXPECT: Some feelings of bloating in the abdomen. Passage of more gas than usual.  Walking can help get rid of the air that was put into your GI tract during the procedure and reduce the bloating. If you had a lower endoscopy (such as a colonoscopy or flexible sigmoidoscopy) you may notice spotting of blood in your stool or on the toilet paper. If you underwent a bowel prep for your procedure, then you may not have a normal bowel movement for a few days.  DIET: Your first meal following the procedure should be a light meal and then it is ok to progress to your normal diet.  A half-sandwich or bowl of soup is an example of a good first meal.  Heavy or fried foods are harder to digest and may make you feel nauseous or bloated.  Likewise meals heavy in dairy and vegetables can cause extra gas to form and this can also increase the bloating.  Drink plenty of fluids but you should avoid alcoholic beverages for 24 hours.  ACTIVITY: Your care partner should take you home directly after the procedure.  You should plan to take it easy, moving slowly for the rest of the day.  You can resume normal activity the day after the procedure however you should NOT DRIVE or use heavy machinery for 24 hours (because of the sedation medicines used during the test).    SYMPTOMS TO REPORT IMMEDIATELY: A gastroenterologist can be reached at any hour.  During normal business hours, 8:30 AM to 5:00 PM Monday through Friday,  call [phone].  After hours and on weekends, please call the GI answering service at [phone] who will take a message and have the physician on call contact you.   Following lower endoscopy (colonoscopy or flexible sigmoidoscopy):  Excessive amounts of blood in the stool  Significant tenderness or worsening of abdominal pains  Swelling of the abdomen that is new, acute  Fever of 100F or higher  Following upper endoscopy (EGD)  Vomiting of blood or coffee ground material  New chest pain or pain under the shoulder blades  Painful or persistently difficult swallowing  New shortness of breath  Fever of 100F or higher  Black, tarry-looking stools  FOLLOW UP: If any biopsies were taken you will be contacted by phone or by letter within the next 1-3 weeks.  Call your gastroenterologist if you have not heard about the biopsies in 3 weeks.  Our staff will call the home number listed on your records the next business day following your procedure to check on you and address any questions or concerns that you may have at that time regarding the information given to you following your procedure. This is a courtesy call and so if there is no answer at the home number and we have not heard from you through the emergency physician on call, we will assume that you have returned to your regular daily activities without incident.  SIGNATURES/CONFIDENTIALITY: You and/or your care   partner have signed paperwork which will be entered into your electronic medical record.  These signatures attest to the fact that that the information above on your After Visit Summary has been reviewed and is understood.  Full responsibility of the confidentiality of this discharge information lies with you and/or your care-partner.YOU HAD AN ENDOSCOPIC PROCEDURE TODAY AT THE [facility]: Refer to the procedure report that was given to you for any specific questions about what was found during the examination.   If the procedure report does not answer your questions, please call your gastroenterologist to clarify.  If you requested that your care partner not be given the details of your procedure findings, then the procedure report has been included in a sealed envelope for you to review at your convenience later.  YOU SHOULD EXPECT: Some feelings of bloating in the abdomen. Passage of more gas than usual.  Walking can help get rid of the air that was put into your GI tract during the procedure and reduce the bloating. If you had a lower endoscopy (such as a colonoscopy or flexible sigmoidoscopy) you may notice spotting of blood in your stool or on the toilet paper. If you underwent a bowel prep for your procedure, then you may not have a normal bowel movement for a few days.  DIET: Your first meal following the procedure should be a light meal and then it is ok to progress to your normal diet.  A half-sandwich or bowl of soup is an example of a good first meal.  Heavy or fried foods are harder to digest and may make you feel nauseous or bloated.  Likewise meals heavy in dairy and vegetables can cause extra gas to form and this can also increase the bloating.  Drink plenty of fluids but you should avoid alcoholic beverages for 24 hours.  ACTIVITY: Your care partner should take you home directly after the procedure.  You should plan to take it easy, moving slowly for the rest of the day.  You can resume normal activity the day after the procedure however you should NOT DRIVE or use heavy machinery for 24 hours (because of the sedation medicines used during the test).    SYMPTOMS TO REPORT IMMEDIATELY: A gastroenterologist can be reached at any hour.  During normal business hours, 8:30 AM to 5:00 PM Monday through Friday, call [phone].  After hours and on weekends, please call the GI answering service at [phone] who will take a message and have the physician on call contact you.   Following lower  endoscopy (colonoscopy or flexible sigmoidoscopy):  Excessive amounts of blood in the stool  Significant tenderness or worsening of abdominal pains  Swelling of the abdomen that is new, acute  Fever of 100F or higher  FOLLOW UP: If any biopsies were taken you will be contacted by phone or by letter within the next 1-3 weeks.  Call your gastroenterologist if you have not heard about the biopsies in 3 weeks.  Our staff will call the home number listed on your records the next business day following your procedure to check on you and address any questions or concerns that you may have at that time regarding the information given to you following your procedure. This is a courtesy call and so if there is no answer at the home number and we have not heard from you through the emergency physician on call, we will assume that you have returned to your regular daily activities   without incident.  SIGNATURES/CONFIDENTIALITY: You and/or your care partner have signed paperwork which will be entered into your electronic medical record.  These signatures attest to the fact that that the information above on your After Visit Summary has been reviewed and is understood.  Full responsibility of the confidentiality of this discharge information lies with you and/or your care-partner.       

## 2011-11-27 NOTE — Op Note (Signed)
[facility] [address] [place], [place]  [address]  COLONOSCOPY PROCEDURE REPORT  PATIENT:  [name], [name]  MR#:  [id number] BIRTHDATE:  [birth year], 55 yrs. old  GENDER:  female ENDOSCOPIST:  Judie Petit T. Russella Dar, MD, [facility]  PROCEDURE DATE:  11/27/2011 PROCEDURE:  Colonoscopy 19147 ASA CLASS:  Class II INDICATIONS:  1) Elevated Risk Screening  2) family history of colon cancer: father MEDICATIONS:   MAC sedation, administered by CRNA, propofol (Diprivan) 240 mg IV DESCRIPTION OF PROCEDURE:   After the risks benefits and alternatives of the procedure were thoroughly explained, informed consent was obtained.  Digital rectal exam was performed and revealed no abnormalities.   The LB [id number] endoscope was introduced through the anus and advanced to the cecum, which was identified by both the appendix and ileocecal valve, without limitations.  The quality of the prep was good, using MoviPrep. The instrument was then slowly withdrawn as the colon was fully examined. <<PROCEDUREIMAGES>> FINDINGS:  A normal appearing cecum, ileocecal valve, and appendiceal orifice were identified. The ascending, hepatic flexure, transverse, splenic flexure, descending, sigmoid colon, and rectum appeared unremarkable. Retroflexed views in the rectum revealed no abnormalities. The time to cecum =  2.5  minutes. The scope was then withdrawn (time =  10  min) from the patient and the procedure completed.  COMPLICATIONS:  None  ENDOSCOPIC IMPRESSION: 1) Normal colon  RECOMMENDATIONS: 1) Repeat Colonoscopy in 5 years.  Venita Lick. Russella Dar, MD, [facility]  n. eSIGNED:   Venita Lick. Kashif Pooler at 11/27/2011 03:07 PM  Johny Shock, [phone]

## 2011-11-27 NOTE — Progress Notes (Signed)
Patient did not have preoperative order for IV antibiotic SSI prophylaxis. (G8918)  Patient did not experience any of the following events: a burn prior to discharge; a fall within the facility; wrong site/side/patient/procedure/implant event; or a hospital transfer or hospital admission upon discharge from the facility. (G8907)  

## 2011-11-28 ENCOUNTER — Telehealth: Payer: Self-pay | Admitting: *Deleted

## 2011-11-28 LAB — HM COLONOSCOPY: HM Colonoscopy: NORMAL

## 2011-11-28 NOTE — Telephone Encounter (Signed)
  Follow up Call-  Call back number 11/27/2011  Post procedure Call Back phone  # [phone]  Permission to leave phone message Yes     Patient questions:  Message left to call us if necessary.

## 2012-01-03 ENCOUNTER — Other Ambulatory Visit: Payer: Self-pay | Admitting: *Deleted

## 2012-01-03 MED ORDER — CELECOXIB 200 MG PO CAPS: 200.0000 mg | ORAL_CAPSULE | Freq: Every day | ORAL | Status: DC

## 2012-01-13 ENCOUNTER — Other Ambulatory Visit: Payer: Self-pay | Admitting: Gastroenterology

## 2012-01-28 ENCOUNTER — Encounter: Payer: Self-pay | Admitting: Gastroenterology

## 2012-02-09 ENCOUNTER — Other Ambulatory Visit: Payer: Self-pay | Admitting: Internal Medicine

## 2012-02-17 ENCOUNTER — Other Ambulatory Visit: Payer: Self-pay | Admitting: Internal Medicine

## 2012-03-04 ENCOUNTER — Other Ambulatory Visit: Payer: Self-pay | Admitting: Gastroenterology

## 2012-04-03 ENCOUNTER — Ambulatory Visit (INDEPENDENT_AMBULATORY_CARE_PROVIDER_SITE_OTHER): Payer: BC Managed Care – PPO | Admitting: Family Medicine

## 2012-04-03 VITALS — BP 142/98 | HR 71 | Temp 98.1°F | Resp 16 | Ht 67.0 in | Wt 240.0 lb

## 2012-04-03 DIAGNOSIS — M25539 Pain in unspecified wrist: Secondary | ICD-10-CM

## 2012-04-03 DIAGNOSIS — S66919A Strain of unspecified muscle, fascia and tendon at wrist and hand level, unspecified hand, initial encounter: Secondary | ICD-10-CM

## 2012-04-03 DIAGNOSIS — S63509A Unspecified sprain of unspecified wrist, initial encounter: Secondary | ICD-10-CM

## 2012-04-03 NOTE — Patient Instructions (Addendum)
De Quervain's Disease  De Quervain's disease is a condition often seen in racquet sports where there is a soreness (inflammation) in the cord like structures (tendons) which attach muscle to bone on the thumb side of the wrist. There may be a tightening of the tissuesaround the tendons. This condition is often helped by giving up or modifying the activity which caused it. When conservative treatment does not help, surgery may be required. Conservative treatment could include changes in the activity which brought about the problem or made it worse. Anti-inflammatory medications and injections may be used to help decrease the inflammation and help with pain control. Your caregiver will help you determine which is best for you.  DIAGNOSIS   Often the diagnosis (learning what is wrong) can be made by examination. Sometimes x-rays are required.  HOME CARE INSTRUCTIONS    Apply ice to the sore area for 15 to 20 minutes, 3 to 4 times per day while awake. Put the ice in a plastic bag and place a towel between the bag of ice and your skin. This is especially helpful if it can be done after all activities involving the sore wrist.   Temporary splinting may help.   Only take over-the-counter or prescription medicines for pain, discomfort or fever as directed by your caregiver.  SEEK MEDICAL CARE IF:    Pain relief is not obtained with medications, or if you have increasing pain and seem to be getting worse rather than better.  MAKE SURE YOU:    Understand these instructions.   Will watch your condition.   Will get help right away if you are not doing well or get worse.  Document Released: 02/12/2001 Document Revised: 08/12/2011 Document Reviewed: 05/20/2005  [employer] Patient Information 2013 [employer].

## 2012-04-03 NOTE — Progress Notes (Signed)
[facility] [address] [place] [address] [phone]  Date:  04/03/2012   Name:  [name]   DOB:  Jan 07, 1956   MRN:  [id number]  PCP:  Sanda Linger, MD    Chief Complaint: Hand Pain   History of Present Illness:  [name] is a 56 y.o. very pleasant female patient who presents with the following:  She is having pain in her right hand for about 7- 10 days. It hurts if she lifts something or turns it a certain way.  There was no known injury.  No recent new activities, but she does type a lot at her job.   She has tried topical rubs and an ace bandage, but has not tried anything oral yet for her pain. She does use aleve as needed for other pains.   She works as a Ship broker  Patient Active Problem List  Diagnosis  . DIABETES MELLITUS, TYPE II  . HYPERLIPIDEMIA  . HYPERTENSION, BENIGN ESSENTIAL  . GERD  . Irritable bowel syndrome  . FATTY LIVER DISEASE  . BACK PAIN, LUMBAR, WITH RADICULOPATHY  . BACK PAIN  . FIBROMYALGIA  . Burning mouth syndrome  . Persistent disorder of initiating or maintaining sleep  . Knee pain, right  . DJD (degenerative joint disease) of knee    Past Medical History  Diagnosis Date  . GERD (gastroesophageal reflux disease)   . Hyperlipidemia   . Hyperthyroidism   . Benign fundic gland polyps of stomach   . IBS (irritable bowel syndrome)   . Arthritis   . Fibromyalgia   . Fatty liver     Past Surgical History  Procedure Date  . Cholecystectomy 2009    low GB EF, no gallstones  . Vaginal hysterectomy   . Laparoscopy     of adhesions    History  Substance Use Topics  . Smoking status: Former Smoker -- 0.1 packs/day for 10 years    Types: Cigarettes    Quit date: 07/14/1981  . Smokeless tobacco: Never Used  . Alcohol Use: No    Family History  Problem Relation Age of Onset  . Colon cancer Father   . Diabetes Brother     Half  . Heart disease Brother   . Heart disease  Mother   . Diabetes Sister     Allergies  Allergen Reactions  . Crestor (Rosuvastatin Calcium)     Muscle aches  . Metformin And Related     abd pain    Medication list has been reviewed and updated.  Current Outpatient Prescriptions on File Prior to Visit  Medication Sig Dispense Refill  . calcium carbonate (TUMS) 500 MG chewable tablet Chew 1 tablet by mouth daily as needed.       . [facility] GAS RELIEF 80 MG chewable tablet CHEW 1 TABLET (80 MG TOTAL) BY MOUTH EVERY 6 (SIX) HOURS AS NEEDED FOR FLATULENCE.  100 tablet  0  . Flavocoxid-Cit Zn Bisglcinate (LIMBREL500) 500-50 MG CAPS Take 1 capsule by mouth 2 (two) times daily.  60 capsule  11  . hydrochlorothiazide (HYDRODIURIL) 25 MG tablet TAKE 1 TABLET BY MOUTH EVERY MORNING  90 tablet  1  . hyoscyamine (LEVBID) 0.375 MG 12 hr tablet Take 0.375 mg by mouth 2 (two) times daily as needed.      . Multiple Vitamin (MULTIVITAMIN) tablet Take 1 tablet by mouth daily.      . Probiotic Product (PROBIOTIC PO) 3 tablets.       Marland Kitchen  ranitidine (ZANTAC) 150 MG tablet Take 1 tablet (150 mg total) by mouth 2 (two) times daily.      . celecoxib (CELEBREX) 200 MG capsule Take 1 capsule (200 mg total) by mouth daily.  30 capsule  0  . GLUCOSAMINE HCL PO Take 2 each by mouth daily.      . hyoscyamine (LEVBID) 0.375 MG 12 hr tablet TAKE 1 TABLET BY MOUTH TWICE DAILY AS NEEDED FOR CRAMPING (ABDOMINAL PAIN)  60 tablet  4  . peg 3350 powder (MOVIPREP) 100 G SOLR Take 1 kit (100 g total) by mouth once.  1 kit  0    Review of Systems:  As per HPI- otherwise negative.   Physical Examination: Filed Vitals:   04/03/12 0940  BP: 142/98  Pulse: 71  Temp: 98.1 F (36.7 C)  Resp: 16   Filed Vitals:   04/03/12 0940  Height: 5\' 7"  (1.702 m)  Weight: 240 lb (108.863 kg)   Body mass index is 37.59 kg/(m^2). Ideal Body Weight: Weight in (lb) to have BMI = 25: 159.3   GEN: WDWN, NAD, Non-toxic, A & O x 3, overweight HEENT: Atraumatic, Normocephalic. Neck  supple. No masses, No LAD. Ears and Nose: No external deformity. CV: RRR, No M/G/R. No JVD. No thrill. No extra heart sounds. PULM: CTA B, no wheezes, crackles, rhonchi. No retractions. No resp. distress. No accessory muscle use. EXTR: No c/c/e NEURO Normal gait.  PSYCH: Normally interactive. Conversant. Not depressed or anxious appearing.  Calm demeanor.  Right wrist: positive finkelstein's test.  She has tenderness over the thumb extensor tendons. She has normal ROM of her wrist and elbow, no redness or swelling.  Hand is negative for any tenderness or swelling/ bruising  Offered x-ray but she declined today as there was no acute injury   Assessment and Plan: 1. Pain, wrist   2. Wrist strain    DeQuervain's tenosynovitis of the right wrist.  See pt instructions for more details. Spica splint, ice, rest the area.  If not better in the next couple of days please RTC or call  Abbe Amsterdam, MD

## 2012-04-22 ENCOUNTER — Ambulatory Visit: Payer: BC Managed Care – PPO | Admitting: Gastroenterology

## 2012-04-27 ENCOUNTER — Ambulatory Visit (INDEPENDENT_AMBULATORY_CARE_PROVIDER_SITE_OTHER): Payer: BC Managed Care – PPO | Admitting: Gastroenterology

## 2012-04-27 ENCOUNTER — Encounter: Payer: Self-pay | Admitting: Gastroenterology

## 2012-04-27 VITALS — BP 120/80 | HR 80 | Ht 67.25 in | Wt 243.1 lb

## 2012-04-27 DIAGNOSIS — R1013 Epigastric pain: Secondary | ICD-10-CM

## 2012-04-27 DIAGNOSIS — R141 Gas pain: Secondary | ICD-10-CM

## 2012-04-27 NOTE — Progress Notes (Signed)
History of Present Illness: This is a 56 year old female with chronic complaints of gas, bloating and abdominal pain. She describes epigastric pain following her evening meal and epigastric pain that wakes her at night. She feels she has significant intestinal gas but has difficulty belching or passing flatus. She is very reluctant to take any PPI because she has had benign gastric fundic polyps. She is also reluctant to use an H2 RA. She underwent colonoscopy earlier this year which was entirely normal. Upper endoscopy performed in 2008 showed a small hiatal hernia. CTscan of the abdomen/pelvis in 2011 showed fatty infiltration of the liver and prior cholecystectomy. Denies weight loss, constipation, diarrhea, change in stool caliber, melena, hematochezia, nausea, vomiting, dysphagia, reflux symptoms, chest pain.  Current Medications, Allergies, Past Medical History, Past Surgical History, Family History and Social History were reviewed in [employer] record.  Physical Exam: General: Well developed , well nourished, no acute distress Head: Normocephalic and atraumatic Eyes:  sclerae anicteric, EOMI Ears: Normal auditory acuity Mouth: No deformity or lesions Lungs: Clear throughout to auscultation Heart: Regular rate and rhythm; no murmurs, rubs or bruits Abdomen: Soft, non tender and non distended. No masses, hepatosplenomegaly or hernias noted. Normal Bowel sounds  Musculoskeletal: Symmetrical with no gross deformities  Pulses:  Normal pulses noted Extremities: No clubbing, cyanosis, edema or deformities noted Neurological: Alert oriented x 4, grossly nonfocal Psychological:  Alert and cooperative. Normal mood and affect  Assessment and Recommendations:  1. Epigastric pain and bloating. R/O gastroparesis. Zantac 150 mg po qd or bid. FODMAP diet. Gas-X when necessary. Schedule GES.  2. Fatty liver. Long term wt loss, low fat diet supervised by her PCP.  3. Family  history of colon cancer. Screening colonoscopy recommended in 5 years.

## 2012-04-27 NOTE — Patient Instructions (Addendum)
You have been scheduled for a gastric emptying scan at [facility] on 05/1712 at 8:00am . Please arrive at least 15 minutes prior to your appointment for registration. Please make certain not to have anything to eat or drink after midnight the night before your test. Hold all stomach medications (ex: Zofran, phenergan, Reglan) 48 hours prior to your test. If you need to reschedule your appointment, please contact radiology scheduling at [phone]. _____________________________________________________________________ A gastric-emptying study measures how long it takes for food to move through your stomach. There are several ways to measure stomach emptying. In the most common test, you eat food that contains a small amount of radioactive material. A scanner that detects the movement of the radioactive material is placed over your abdomen to monitor the rate at which food leaves your stomach. This test normally takes about 2 hours to complete. _____________________________________________________________________  Start over the counter Zantac 150 mg one tablet by mouth daily or twice daily. If no improvement in your symptoms then try the Fodmap diet.

## 2012-05-05 ENCOUNTER — Telehealth: Payer: Self-pay | Admitting: Internal Medicine

## 2012-05-05 NOTE — Telephone Encounter (Signed)
Patient scheduled.

## 2012-05-05 NOTE — Telephone Encounter (Signed)
She needs to be seen.

## 2012-05-05 NOTE — Telephone Encounter (Signed)
Patient called and wants to schedule a Hgb A1C test. She reports she was taken off of her Metformin a few months ago and feels like she is having trouble controlling her glucose level. Reports that her fasting blood sugar level stays around 150. She would like to go back on the Metformin if possible and reports that it did not cause abdominal pain. Please call patient back and schedule lab test/appointment if needed. Thanks.

## 2012-05-06 ENCOUNTER — Encounter: Payer: Self-pay | Admitting: Internal Medicine

## 2012-05-06 ENCOUNTER — Ambulatory Visit (INDEPENDENT_AMBULATORY_CARE_PROVIDER_SITE_OTHER): Payer: BC Managed Care – PPO | Admitting: Internal Medicine

## 2012-05-06 ENCOUNTER — Other Ambulatory Visit (INDEPENDENT_AMBULATORY_CARE_PROVIDER_SITE_OTHER): Payer: BC Managed Care – PPO

## 2012-05-06 VITALS — BP 120/82 | HR 80 | Temp 98.1°F | Resp 16 | Wt 235.0 lb

## 2012-05-06 DIAGNOSIS — IMO0001 Reserved for inherently not codable concepts without codable children: Secondary | ICD-10-CM

## 2012-05-06 DIAGNOSIS — I10 Essential (primary) hypertension: Secondary | ICD-10-CM

## 2012-05-06 DIAGNOSIS — E785 Hyperlipidemia, unspecified: Secondary | ICD-10-CM

## 2012-05-06 DIAGNOSIS — Z23 Encounter for immunization: Secondary | ICD-10-CM

## 2012-05-06 DIAGNOSIS — E119 Type 2 diabetes mellitus without complications: Secondary | ICD-10-CM

## 2012-05-06 DIAGNOSIS — E876 Hypokalemia: Secondary | ICD-10-CM

## 2012-05-06 DIAGNOSIS — K7689 Other specified diseases of liver: Secondary | ICD-10-CM

## 2012-05-06 LAB — CBC WITH DIFFERENTIAL/PLATELET
Eosinophils Relative: 1.9 % (ref 0.0–5.0)
Monocytes Relative: 10.8 % (ref 3.0–12.0)
Neutrophils Relative %: 33.1 % — ABNORMAL LOW (ref 43.0–77.0)
Platelets: 237 10*3/uL (ref 150.0–400.0)
WBC: 4.8 10*3/uL (ref 4.5–10.5)

## 2012-05-06 LAB — SEDIMENTATION RATE: Sed Rate: 15 mm/hr (ref 0–22)

## 2012-05-06 LAB — TSH: TSH: 0.72 u[IU]/mL (ref 0.35–5.50)

## 2012-05-06 LAB — CK: Total CK: 130 U/L (ref 7–177)

## 2012-05-06 LAB — COMPREHENSIVE METABOLIC PANEL
Alkaline Phosphatase: 57 U/L (ref 39–117)
Glucose, Bld: 100 mg/dL — ABNORMAL HIGH (ref 70–99)
Sodium: 138 mEq/L (ref 135–145)
Total Bilirubin: 0.4 mg/dL (ref 0.3–1.2)
Total Protein: 7.6 g/dL (ref 6.0–8.3)

## 2012-05-06 MED ORDER — POTASSIUM CHLORIDE CRYS ER 20 MEQ PO TBCR: 20.0000 meq | EXTENDED_RELEASE_TABLET | Freq: Two times a day (BID) | ORAL | Status: DC

## 2012-05-06 MED ORDER — PREGABALIN 50 MG PO CAPS: 50.0000 mg | ORAL_CAPSULE | Freq: Three times a day (TID) | ORAL | Status: DC

## 2012-05-06 NOTE — Assessment & Plan Note (Signed)
I have asked her to improve her lifestyle modifications (diet,exercise,weight loss)

## 2012-05-06 NOTE — Patient Instructions (Signed)

## 2012-05-06 NOTE — Assessment & Plan Note (Signed)
Her BP is well controlled I will check her lytes and renal function 

## 2012-05-06 NOTE — Assessment & Plan Note (Signed)
I will check her a1c today If it is > 7 then will treat

## 2012-05-06 NOTE — Addendum Note (Signed)
Addended by: Etta Grandchild on: 05/06/2012 03:02 PM   Modules accepted: Orders

## 2012-05-06 NOTE — Assessment & Plan Note (Signed)
Start K+ replacement therapy 

## 2012-05-06 NOTE — Progress Notes (Signed)
Subjective:    Patient ID: [name], female    DOB: [birth year], 56 y.o.   MRN: [id number]  Diabetes She presents for her follow-up diabetic visit. She has type 2 diabetes mellitus. The initial diagnosis of diabetes was made 1 year ago. Her disease course has been worsening. There are no hypoglycemic associated symptoms. Pertinent negatives for hypoglycemia include no confusion, dizziness or nervousness/anxiousness. Associated symptoms include fatigue, polydipsia, polyphagia and polyuria. Pertinent negatives for diabetes include no blurred vision, no chest pain, no foot paresthesias, no foot ulcerations, no visual change, no weakness and no weight loss. There are no hypoglycemic complications. Symptoms are worsening. There are no diabetic complications. When asked about current treatments, none were reported. Her weight is increasing steadily. She is following a generally healthy diet. Meal planning includes avoidance of concentrated sweets. She has not had a previous visit with a dietician. She never participates in exercise. Her home blood glucose trend is increasing steadily. Her breakfast blood glucose range is generally 90-110 mg/dl. Her lunch blood glucose range is generally 110-130 mg/dl. Her dinner blood glucose range is generally 130-140 mg/dl. Her highest blood glucose is 140-180 mg/dl. Her overall blood glucose range is 130-140 mg/dl. An ACE inhibitor/angiotensin II receptor blocker is not being taken. She does not see a podiatrist.Eye exam is current.      Review of Systems  Constitutional: Positive for fatigue and unexpected weight change (some weight gain). Negative for fever, chills, weight loss, diaphoresis, activity change and appetite change.  HENT: Negative.   Eyes: Negative.  Negative for blurred vision.  Respiratory: Negative for cough, chest tightness, shortness of breath, wheezing and stridor.   Cardiovascular: Negative for chest pain, palpitations and leg swelling.   Gastrointestinal: Negative for nausea, vomiting, abdominal pain, diarrhea and constipation.  Genitourinary: Positive for polyuria.  Musculoskeletal: Positive for myalgias (aching all over). Negative for back pain, joint swelling, arthralgias and gait problem.  Skin: Negative.   Neurological: Negative.  Negative for dizziness, syncope, weakness and light-headedness.  Hematological: Positive for polydipsia and polyphagia. Negative for adenopathy. Does not bruise/bleed easily.  Psychiatric/Behavioral: Positive for sleep disturbance (aches at night, it keeps her awake). Negative for suicidal ideas, hallucinations, behavioral problems, confusion, self-injury, dysphoric mood, decreased concentration and agitation. The patient is not nervous/anxious and is not hyperactive.        Objective:   Physical Exam  Vitals reviewed. Constitutional: She is oriented to person, place, and time. She appears well-developed and well-nourished. No distress.  HENT:  Head: Normocephalic and atraumatic.  Mouth/Throat: Oropharynx is clear and moist. No oropharyngeal exudate.  Eyes: Conjunctivae normal are normal. Right eye exhibits no discharge. Left eye exhibits no discharge. No scleral icterus.  Neck: Normal range of motion. Neck supple. No JVD present. No tracheal deviation present. No thyromegaly present.  Cardiovascular: Normal rate, regular rhythm, normal heart sounds and intact distal pulses.  Exam reveals no gallop and no friction rub.   No murmur heard. Pulmonary/Chest: Effort normal and breath sounds normal. No stridor. No respiratory distress. She has no wheezes. She has no rales. She exhibits no tenderness.  Abdominal: Soft. Bowel sounds are normal. She exhibits no distension and no mass. There is no tenderness. There is no rebound and no guarding.  Musculoskeletal: Normal range of motion. She exhibits no edema and no tenderness.  Lymphadenopathy:    She has no cervical adenopathy.  Neurological: She is  oriented to person, place, and time.  Skin: Skin is warm and dry. No rash noted.  She is not diaphoretic. No erythema. No pallor.  Psychiatric: She has a normal mood and affect. Her behavior is normal. Judgment and thought content normal.      Lab Results  Component Value Date   WBC 5.4 09/09/2011   HGB 13.8 09/09/2011   HCT 39.6 09/09/2011   PLT 225.0 09/09/2011   GLUCOSE 125* 09/09/2011   CHOL 191 09/09/2011   TRIG 149.0 09/09/2011   HDL 54.00 09/09/2011   LDLCALC 107* 09/09/2011   ALT 30 09/09/2011   AST 14 09/09/2011   NA 143 09/09/2011   K 4.6 09/09/2011   CL 102 09/09/2011   CREATININE 0.9 09/09/2011   BUN 17 09/09/2011   CO2 32 09/09/2011   TSH 0.57 09/09/2011   HGBA1C 5.9 09/09/2011   MICROALBUR 1.6 08/09/2008      Assessment & Plan:

## 2012-05-06 NOTE — Assessment & Plan Note (Signed)
CMP and TSh today She does not want to take a statin

## 2012-05-06 NOTE — Assessment & Plan Note (Signed)
I have asked her to try Lyrica I will check her labs today to look for myopathy and inflammation

## 2012-05-07 ENCOUNTER — Other Ambulatory Visit: Payer: Self-pay

## 2012-05-07 ENCOUNTER — Encounter: Payer: Self-pay | Admitting: Internal Medicine

## 2012-05-07 MED ORDER — SIMETHICONE 80 MG PO CHEW: CHEWABLE_TABLET | ORAL | Status: DC

## 2012-05-19 ENCOUNTER — Ambulatory Visit (HOSPITAL_COMMUNITY)
Admission: RE | Admit: 2012-05-19 | Discharge: 2012-05-19 | Disposition: A | Discharge status: Home / Self Care | Payer: BC Managed Care – PPO | Source: Ambulatory Visit | Attending: Gastroenterology | Admitting: Gastroenterology

## 2012-05-19 DIAGNOSIS — R1013 Epigastric pain: Secondary | ICD-10-CM | POA: Insufficient documentation

## 2012-05-19 MED ORDER — TECHNETIUM TC 99M SULFUR COLLOID
2.1000 | Freq: Once | INTRAVENOUS | Status: AC | PRN
  Administered 2012-05-19: 2.1 via INTRAVENOUS

## 2012-05-29 ENCOUNTER — Telehealth: Payer: Self-pay | Admitting: Internal Medicine

## 2012-05-29 ENCOUNTER — Other Ambulatory Visit: Payer: Self-pay | Admitting: Internal Medicine

## 2012-05-29 DIAGNOSIS — IMO0001 Reserved for inherently not codable concepts without codable children: Secondary | ICD-10-CM

## 2012-05-29 MED ORDER — PREGABALIN 50 MG PO CAPS: 50.0000 mg | ORAL_CAPSULE | Freq: Three times a day (TID) | ORAL | Status: DC

## 2012-05-29 NOTE — Telephone Encounter (Signed)
[place], Dr. Yetta Barre did not write in his note what dose he gave her. Does the patient know so that I can call it in? Rene Kocher

## 2012-05-29 NOTE — Telephone Encounter (Signed)
[place], RX set to [facility]

## 2012-05-29 NOTE — Telephone Encounter (Signed)
She was taking 50mg  1 pill in the morning and 2 pills at night per patient

## 2012-05-29 NOTE — Telephone Encounter (Signed)
Pt informed

## 2012-05-29 NOTE — Telephone Encounter (Signed)
Patient was given samples of Lyrica at her last OV and she would like [facility] in to [facility] [address]

## 2012-06-04 ENCOUNTER — Telehealth: Payer: Self-pay | Admitting: *Deleted

## 2012-06-04 NOTE — Telephone Encounter (Signed)
Left msg on triage stating she call last week about getting [facility]. Pharmacy states rx hasn't been call in. Requesting call bck...Raechel Chute

## 2012-06-04 NOTE — Telephone Encounter (Signed)
[facility]ed [facility] spoke with sylvia she states they didn't get rx she tried to send e-script and they can't take electronically rx for lyrica. Gave rx verbally. Notified pt [facility]...lmb

## 2012-06-11 ENCOUNTER — Ambulatory Visit: Payer: BC Managed Care – PPO | Admitting: Internal Medicine

## 2012-06-11 DIAGNOSIS — Z0289 Encounter for other administrative examinations: Secondary | ICD-10-CM

## 2012-06-12 ENCOUNTER — Ambulatory Visit (INDEPENDENT_AMBULATORY_CARE_PROVIDER_SITE_OTHER): Payer: BC Managed Care – PPO | Admitting: Internal Medicine

## 2012-06-12 ENCOUNTER — Encounter: Payer: Self-pay | Admitting: Internal Medicine

## 2012-06-12 VITALS — BP 118/88 | HR 72 | Temp 98.1°F | Wt 243.8 lb

## 2012-06-12 DIAGNOSIS — I10 Essential (primary) hypertension: Secondary | ICD-10-CM

## 2012-06-12 DIAGNOSIS — E119 Type 2 diabetes mellitus without complications: Secondary | ICD-10-CM

## 2012-06-12 DIAGNOSIS — L659 Nonscarring hair loss, unspecified: Secondary | ICD-10-CM

## 2012-06-12 MED ORDER — COAL TAR 20 % EX SOLN: 1.0000 "application " | Freq: Every day | CUTANEOUS | Status: DC

## 2012-06-12 NOTE — Patient Instructions (Signed)
It was good to see you today. Coal tar shampoo prescription - Your prescription(s) have been submitted to your pharmacy. Please take as directed and contact our office if you believe you are having problem(s) with the medication(s). we'll make referral to dermatolgist. Our office will contact you regarding appointment(s) once made.

## 2012-06-12 NOTE — Progress Notes (Signed)
  Subjective:    Patient ID: [name], female    DOB: Jan 08, 1956, 57 y.o.   MRN: [id number]  HPI  complains of ring worm? Hair loss in patches on scalp >12 mo Progressively worse - now 2nd area of loss in past 4 months No other body hair affected No other rash or skin lesions Denies chemical, mechanical "trauma" to hair   Past Medical History  Diagnosis Date  . GERD (gastroesophageal reflux disease)   . Hyperlipidemia   . Hyperthyroidism   . Benign fundic gland polyps of stomach   . IBS (irritable bowel syndrome)   . Arthritis   . Fibromyalgia   . Fatty liver     Review of Systems  Constitutional: Negative for fever and fatigue.  Respiratory: Negative for cough and shortness of breath.   Musculoskeletal: Negative for joint swelling and arthralgias.  Skin: Negative for color change, rash and wound.       Objective:   Physical Exam BP 118/88  Pulse 72  Temp 98.1 F (36.7 C) (Oral)  Wt 243 lb 12.8 oz (110.587 kg)  SpO2 97% Wt Readings from Last 3 Encounters:  06/12/12 243 lb 12.8 oz (110.587 kg)  05/06/12 235 lb (106.595 kg)  04/27/12 243 lb 2 oz (110.281 kg)   Constitutional: She appears well-developed and well-nourished. No distress.  Neck: Normal range of motion. Neck supple. No JVD present. No thyromegaly present.  Cardiovascular: Normal rate, regular rhythm and normal heart sounds.  No murmur heard. No BLE edema. Pulmonary/Chest: Effort normal and breath sounds normal. No respiratory distress. She has no wheezes.  Neurological: She is alert and oriented to person, place, and time. No cranial nerve deficit. Coordination normal.  Skin: Scalp with large 4x4.2 cm irregular patch of alopecia on left posterior scalp above occipital region - no scalp edema, lesions or ulcers. Sparse short hair growth within affected region. Second separate affected area of scalp at L occipital/nape, 1x 1.8 cm.  Remaining hair dense and not easily broker. Periauricular/temporal  thinning bilaterally but without hair loss/alopecia. remaining scalp unremarkable. Remaining skin is warm and dry. No rash noted. No erythema.  Psychiatric: She has a normal mood and affect. Her behavior is normal. Judgment and thought content normal.   Lab Results  Component Value Date   WBC 4.8 05/06/2012   HGB 13.6 05/06/2012   HCT 39.3 05/06/2012   PLT 237.0 05/06/2012   GLUCOSE 100* 05/06/2012   CHOL 191 09/09/2011   TRIG 149.0 09/09/2011   HDL 54.00 09/09/2011   LDLCALC 107* 09/09/2011   ALT 42* 05/06/2012   AST 22 05/06/2012   NA 138 05/06/2012   K 3.4* 05/06/2012   CL 99 05/06/2012   CREATININE 0.7 05/06/2012   BUN 10 05/06/2012   CO2 31 05/06/2012   TSH 0.72 05/06/2012   HGBA1C 6.4 05/06/2012   MICROALBUR 1.6 08/09/2008   Iron/TIBC/Ferritin No results found for this basename: iron, tibc, ferritin       Assessment & Plan:   Alopecia - 2 areas of marked hair thinning on scalp - slowly progressive in past 12 months. Denies other trauma, chemical treatment, mechanical strain. No scalp lesions evident or inflammation. Reports improvement but no resolution with OTC antifungal shampoo. Labs reviewed: no thyroid condition or anemia, no other evidence for systemic disease. Suspect fungal, but refer to derm for bx o confirm. Tx with rx coal tar pending derm/bx for further clarification on tx.

## 2012-06-13 NOTE — Assessment & Plan Note (Signed)
Diet controlled at present Lab Results  Component Value Date   HGBA1C 6.4 05/06/2012   HGBA1C 5.9 09/09/2011   HGBA1C 6.5 07/15/2011   Lab Results  Component Value Date   MICROALBUR 1.6 08/09/2008   LDLCALC 107* 09/09/2011   CREATININE 0.7 05/06/2012

## 2012-06-13 NOTE — Assessment & Plan Note (Signed)
BP Readings from Last 3 Encounters:  06/12/12 118/88  05/06/12 120/82  04/27/12 120/80   The current medical regimen is effective;  continue present plan and medications.

## 2012-09-14 ENCOUNTER — Encounter: Payer: Self-pay | Admitting: Family Medicine

## 2012-09-14 ENCOUNTER — Ambulatory Visit (INDEPENDENT_AMBULATORY_CARE_PROVIDER_SITE_OTHER): Payer: BC Managed Care – PPO | Admitting: Family Medicine

## 2012-09-14 DIAGNOSIS — M25529 Pain in unspecified elbow: Secondary | ICD-10-CM

## 2012-09-14 DIAGNOSIS — M25522 Pain in left elbow: Secondary | ICD-10-CM

## 2012-09-14 DIAGNOSIS — R42 Dizziness and giddiness: Secondary | ICD-10-CM

## 2012-09-14 DIAGNOSIS — G4700 Insomnia, unspecified: Secondary | ICD-10-CM

## 2012-09-14 LAB — POCT CBC
Granulocyte percent: 40.5 %G (ref 37–80)
HCT, POC: 42.9 % (ref 37.7–47.9)
Hemoglobin: 14.4 g/dL (ref 12.2–16.2)
Lymph, poc: 2.2 (ref 0.6–3.4)
MCH, POC: 29.8 pg (ref 27–31.2)
MCHC: 33.6 g/dL (ref 31.8–35.4)
MCV: 88.7 fL (ref 80–97)
MID (cbc): 0.6 (ref 0–0.9)
MPV: 9.9 fL (ref 0–99.8)
POC Granulocyte: 1.9 — AB (ref 2–6.9)
POC LYMPH PERCENT: 46.6 %L (ref 10–50)
POC MID %: 12.9 %M — AB (ref 0–12)
Platelet Count, POC: 253 10*3/uL (ref 142–424)
RBC: 4.84 M/uL (ref 4.04–5.48)
RDW, POC: 15.7 %
WBC: 4.8 10*3/uL (ref 4.6–10.2)

## 2012-09-14 LAB — COMPREHENSIVE METABOLIC PANEL
ALT: 32 U/L (ref 0–35)
AST: 18 U/L (ref 0–37)
Albumin: 4.8 g/dL (ref 3.5–5.2)
Alkaline Phosphatase: 59 U/L (ref 39–117)
BUN: 14 mg/dL (ref 6–23)
CO2: 28 mEq/L (ref 19–32)
Calcium: 10.4 mg/dL (ref 8.4–10.5)
Chloride: 99 mEq/L (ref 96–112)
Creat: 0.75 mg/dL (ref 0.50–1.10)
Glucose, Bld: 90 mg/dL (ref 70–99)
Potassium: 4 mEq/L (ref 3.5–5.3)
Sodium: 138 mEq/L (ref 135–145)
Total Bilirubin: 0.5 mg/dL (ref 0.3–1.2)
Total Protein: 8.1 g/dL (ref 6.0–8.3)

## 2012-09-14 LAB — TSH: TSH: 0.433 u[IU]/mL (ref 0.350–4.500)

## 2012-09-14 LAB — FOLLICLE STIMULATING HORMONE: FSH: 59.5 m[IU]/mL

## 2012-09-14 MED ORDER — CYCLOBENZAPRINE HCL 10 MG PO TABS: 10.0000 mg | ORAL_TABLET | Freq: Every day | ORAL | Status: DC

## 2012-09-14 NOTE — Progress Notes (Signed)
This 57 year old woman who works for the [employer] here in [place]. She presents with 3 sleepless nights, malaise, left arm pain which has been chronic for years. She was prescribed Lyrica for the latter but stopped taking it because it wasn't helping her.  Patient denies a headache or weakness. She denies numbness. She describes the pain in her left arm as radiating down from her shoulder down the posterior aspect of her arm into her lateral to is. She has no weakness in that arm.  Patient denies headache. She denies nausea vomiting or diarrhea, chest pain, shortness of breath, or palpitations  Patient does complain about frequent sweats and wonders if she's going through menopause.  Last TSH was checked over year ago. Patient's had a hysterectomy.  Objective: No acute distress the patient appears tired. She is pleasant, cooperative, and appropriate HEENT: Unremarkable Neck: Supple no adenopathy bit tender at the base of the left neck with deep palpation. The muscles in the medial trapezius on the left are also tender. There is no spinal scoliosis.  Chest: Clear Heart: Regular no murmur EKG: Normal sinus rhythm Abdomen: Soft nontender Skin: No rash or hyperpigmentation Reflexes: Normal biceps jerks and triceps jerks Sensation: Normal hand sensation to touch Results for orders placed in visit on 09/14/12  POCT CBC      Result Value Range   WBC 4.8  4.6 - 10.2 K/uL   Lymph, poc 2.2  0.6 - 3.4   POC LYMPH PERCENT 46.6  10 - 50 %L   MID (cbc) 0.6  0 - 0.9   POC MID % 12.9 (*) 0 - 12 %M   POC Granulocyte 1.9 (*) 2 - 6.9   Granulocyte percent 40.5  37 - 80 %G   RBC 4.84  4.04 - 5.48 M/uL   Hemoglobin 14.4  12.2 - 16.2 g/dL   HCT, POC 16.1  09.6 - 47.9 %   MCV 88.7  80 - 97 fL   MCH, POC 29.8  27 - 31.2 pg   MCHC 33.6  31.8 - 35.4 g/dL   RDW, POC 04.5     Platelet Count, POC 253  142 - 424 K/uL   MPV 9.9  0 - 99.8 fL     Assessment: Chronic neck pain, stress at work, mild  radiculopathy in the left arm.  Plan:Dizziness and giddiness - Plan: EKG 12-Lead  Insomnia - Plan: POCT CBC, TSH, Comprehensive metabolic panel, Sickle cell screen, Follicle Stimulating Hormone  Pain in joint, upper arm, left - Plan: POCT CBC, TSH, Comprehensive metabolic panel, Sickle cell screen, Follicle Stimulating Hormone, cyclobenzaprine (FLEXERIL) 10 MG tablet, Ambulatory referral to Neurology

## 2012-09-14 NOTE — Patient Instructions (Addendum)
Cervical Radiculopathy Cervical radiculopathy happens when a nerve in the neck is pinched or bruised by a slipped (herniated) disk or by arthritic changes in the bones of the cervical spine. This can occur due to an injury or as part of the normal aging process. Pressure on the cervical nerves can cause pain or numbness that runs from your neck all the way down into your arm and fingers. CAUSES  There are many possible causes, including:  Injury.  Muscle tightness in the neck from overuse.  Swollen, painful joints (arthritis).  Breakdown or degeneration in the bones and joints of the spine (spondylosis) due to aging.  Bone spurs that may develop near the cervical nerves. SYMPTOMS  Symptoms include pain, weakness, or numbness in the affected arm and hand. Pain can be severe or irritating. Symptoms may be worse when extending or turning the neck. DIAGNOSIS  Your caregiver will ask about your symptoms and do a physical exam. He or she may test your strength and reflexes. X-rays, CT scans, and MRI scans may be needed in cases of injury or if the symptoms do not go away after a period of time. Electromyography (EMG) or nerve conduction testing may be done to study how your nerves and muscles are working. TREATMENT  Your caregiver may recommend certain exercises to help relieve your symptoms. Cervical radiculopathy can, and often does, get better with time and treatment. If your problems continue, treatment options may include:  Wearing a soft collar for short periods of time.  Physical therapy to strengthen the neck muscles.  Medicines, such as nonsteroidal anti-inflammatory drugs (NSAIDs), oral corticosteroids, or spinal injections.  Surgery. Different types of surgery may be done depending on the cause of your problems. HOME CARE INSTRUCTIONS   Put ice on the affected area.  Put ice in a plastic bag.  Place a towel between your skin and the bag.  Leave the ice on for 15 to 20  minutes, 3 to 4 times a day or as directed by your caregiver.  If ice does not help, you can try using heat. Take a warm shower or bath, or use a hot water bottle as directed by your caregiver.  You may try a gentle neck and shoulder massage.  Use a flat pillow when you sleep.  Only take over-the-counter or prescription medicines for pain, discomfort, or fever as directed by your caregiver.  If physical therapy was prescribed, follow your caregiver's directions.  If a soft collar was prescribed, use it as directed. SEEK IMMEDIATE MEDICAL CARE IF:   Your pain gets much worse and cannot be controlled with medicines.  You have weakness or numbness in your hand, arm, face, or leg.  You have a high fever or a stiff, rigid neck.  You lose bowel or bladder control (incontinence).  You have trouble with walking, balance, or speaking. MAKE SURE YOU:   Understand these instructions.  Will watch your condition.  Will get help right away if you are not doing well or get worse. Document Released: 02/12/2001 Document Revised: 08/12/2011 Document Reviewed: 01/01/2011 [facility] Patient Information 2013 [place], [place]. Insomnia Insomnia is frequent trouble falling and/or staying asleep. Insomnia can be a long term problem or a short term problem. Both are common. Insomnia can be a short term problem when the wakefulness is related to a certain stress or worry. Long term insomnia is often related to ongoing stress during waking hours and/or poor sleeping habits. Overtime, sleep deprivation itself can make the problem worse.  Every little thing feels more severe because you are overtired and your ability to cope is decreased. CAUSES   Stress, anxiety, and depression.  Poor sleeping habits.  Distractions such as TV in the bedroom.  Naps close to bedtime.  Engaging in emotionally charged conversations before bed.  Technical reading before sleep.  Alcohol and other sedatives. They may make  the problem worse. They can hurt normal sleep patterns and normal dream activity.  Stimulants such as caffeine for several hours prior to bedtime.  Pain syndromes and shortness of breath can cause insomnia.  Exercise late at night.  Changing time zones may cause sleeping problems (jet lag). It is sometimes helpful to have someone observe your sleeping patterns. They should look for periods of not breathing during the night (sleep apnea). They should also look to see how long those periods last. If you live alone or observers are uncertain, you can also be observed at a sleep clinic where your sleep patterns will be professionally monitored. Sleep apnea requires a checkup and treatment. Give your caregivers your medical history. Give your caregivers observations your family has made about your sleep.  SYMPTOMS   Not feeling rested in the morning.  Anxiety and restlessness at bedtime.  Difficulty falling and staying asleep. TREATMENT   Your caregiver may prescribe treatment for an underlying medical disorders. Your caregiver can give advice or help if you are using alcohol or other drugs for self-medication. Treatment of underlying problems will usually eliminate insomnia problems.  Medications can be prescribed for short time use. They are generally not recommended for lengthy use.  Over-the-counter sleep medicines are not recommended for lengthy use. They can be habit forming.  You can promote easier sleeping by making lifestyle changes such as:  Using relaxation techniques that help with breathing and reduce muscle tension.  Exercising earlier in the day.  Changing your diet and the time of your last meal. No night time snacks.  Establish a regular time to go to bed.  Counseling can help with stressful problems and worry.  Soothing music and white noise may be helpful if there are background noises you cannot remove.  Stop tedious detailed work at least one hour before  bedtime. HOME CARE INSTRUCTIONS   Keep a diary. Inform your caregiver about your progress. This includes any medication side effects. See your caregiver regularly. Take note of:  Times when you are asleep.  Times when you are awake during the night.  The quality of your sleep.  How you feel the next day. This information will help your caregiver care for you.  Get out of bed if you are still awake after 15 minutes. Read or do some quiet activity. Keep the lights down. Wait until you feel sleepy and go back to bed.  Keep regular sleeping and waking hours. Avoid naps.  Exercise regularly.  Avoid distractions at bedtime. Distractions include watching television or engaging in any intense or detailed activity like attempting to balance the household checkbook.  Develop a bedtime ritual. Keep a familiar routine of bathing, brushing your teeth, climbing into bed at the same time each night, listening to soothing music. Routines increase the success of falling to sleep faster.  Use relaxation techniques. This can be using breathing and muscle tension release routines. It can also include visualizing peaceful scenes. You can also help control troubling or intruding thoughts by keeping your mind occupied with boring or repetitive thoughts like the old concept of counting sheep. You can make  it more creative like imagining planting one beautiful flower after another in your backyard garden.  During your day, work to eliminate stress. When this is not possible use some of the previous suggestions to help reduce the anxiety that accompanies stressful situations. MAKE SURE YOU:   Understand these instructions.  Will watch your condition.  Will get help right away if you are not doing well or get worse. Document Released: 05/17/2000 Document Revised: 08/12/2011 Document Reviewed: 06/17/2007 [facility] Patient Information 2013 [place], [place].

## 2012-09-15 LAB — SICKLE CELL SCREEN

## 2012-09-17 ENCOUNTER — Telehealth: Payer: Self-pay | Admitting: Family Medicine

## 2012-09-17 NOTE — Telephone Encounter (Signed)
Message copied by Lucia Gaskins on Thu Sep 17, 2012  9:48 AM ------      Message from: COE, RENEE R      Created: Thu Sep 17, 2012  9:46 AM                   ----- Message -----         From: Elvina Sidle, MD         Sent: 09/15/2012   4:20 PM           To: Ericka Pontiff            Please have patient return for the sickle cell screen      ----- Message -----         From: Ericka Pontiff         Sent: 09/15/2012   2:42 PM           To: Elvina Sidle, MD            Dr. Milus Glazier,            [facility] is unable to do sickle cell test.  No lavender tube was sent.            ReNee' Artist       ------

## 2012-09-17 NOTE — Telephone Encounter (Signed)
Left a message for patient to return call,

## 2012-09-18 NOTE — Addendum Note (Signed)
Addended by: Johnnette Litter on: 09/18/2012 09:16 AM   Modules accepted: Orders

## 2012-09-23 ENCOUNTER — Encounter: Payer: Self-pay | Admitting: Gastroenterology

## 2012-09-25 ENCOUNTER — Ambulatory Visit: Payer: BC Managed Care – PPO | Admitting: Diagnostic Neuroimaging

## 2012-09-25 ENCOUNTER — Encounter: Payer: Self-pay | Admitting: Diagnostic Neuroimaging

## 2012-09-25 ENCOUNTER — Ambulatory Visit (INDEPENDENT_AMBULATORY_CARE_PROVIDER_SITE_OTHER): Payer: BC Managed Care – PPO | Admitting: Diagnostic Neuroimaging

## 2012-09-25 VITALS — BP 135/85 | HR 76 | Temp 97.9°F | Ht 69.0 in | Wt 240.0 lb

## 2012-09-25 DIAGNOSIS — R200 Anesthesia of skin: Secondary | ICD-10-CM

## 2012-09-25 DIAGNOSIS — M542 Cervicalgia: Secondary | ICD-10-CM

## 2012-09-25 DIAGNOSIS — R209 Unspecified disturbances of skin sensation: Secondary | ICD-10-CM

## 2012-09-25 DIAGNOSIS — R51 Headache: Secondary | ICD-10-CM

## 2012-09-25 DIAGNOSIS — M79609 Pain in unspecified limb: Secondary | ICD-10-CM

## 2012-09-25 DIAGNOSIS — R519 Headache, unspecified: Secondary | ICD-10-CM

## 2012-09-25 MED ORDER — DULOXETINE HCL 60 MG PO CPEP: 60.0000 mg | ORAL_CAPSULE | Freq: Every day | ORAL | Status: DC

## 2012-09-25 MED ORDER — DULOXETINE HCL 30 MG PO CPEP: 30.0000 mg | ORAL_CAPSULE | Freq: Every day | ORAL | Status: DC

## 2012-09-25 NOTE — Patient Instructions (Addendum)
I will order PT and MRI. Try cymbalta.

## 2012-09-25 NOTE — Progress Notes (Signed)
[facility]  PATIENT: [name] DOB: Oct 16, 1955  REFERRING CLINICIAN: Elvina Sidle HISTORY FROM: patient REASON FOR VISIT: new consult   HISTORICAL  CHIEF COMPLAINT:  Chief Complaint  Patient presents with  . Pain    Left arm, shoulder and right leg    HISTORY OF PRESENT ILLNESS:   57 year old right-handed female here for evaluation of neck pain, radiating to the left upper extremity.  Patient reports a least one year history of pain in her left upper extremity. Symptoms radiate down her left arm to her left fourth and fifth digit. Symptoms seem to be a little better when she is physically active. Patient also reports two-year history of abnormal pins and needle sensation in her left mouth, evaluated by dentist without specific diagnosis.  Patient also has fibromyalgia diagnosed several years ago. Patient has tried Lyrica, Flexeril, Cymbalta in the past without significant relief.  REVIEW OF SYSTEMS: Full 14 system review of systems performed and notable only for weight gain fatigue blurred vision eye pain constipation feeling hot headache not next sleep.  ALLERGIES: Allergies  Allergen Reactions  . Crestor (Rosuvastatin Calcium)     Muscle aches  . Metformin And Related     abd pain    HOME MEDICATIONS: Outpatient Prescriptions Prior to Visit  Medication Sig Dispense Refill  . calcium carbonate (TUMS) 500 MG chewable tablet Chew 1 tablet by mouth daily as needed.       Marland Kitchen Coal Tar 20 % SOLN Apply 1 application topically daily.  500 mL  0  . cyclobenzaprine (FLEXERIL) 10 MG tablet Take 1 tablet (10 mg total) by mouth at bedtime.  10 tablet  0  . fish oil-omega-3 fatty acids 1000 MG capsule Take 2 g by mouth daily.      . Flavocoxid-Cit Zn Bisglcinate (LIMBREL500) 500-50 MG CAPS Take 1 capsule by mouth 2 (two) times daily.  60 capsule  11  . GLUCOSAMINE HCL PO Take 2 each by mouth daily.      . hydrochlorothiazide (HYDRODIURIL) 25 MG tablet  TAKE 1 TABLET BY MOUTH EVERY MORNING  90 tablet  1  . Multiple Vitamin (MULTIVITAMIN) tablet Take 1 tablet by mouth daily.      . potassium chloride SA (K-DUR,KLOR-CON) 20 MEQ tablet Take 1 tablet (20 mEq total) by mouth 2 (two) times daily.  180 tablet  1  . Probiotic Product (PROBIOTIC PO) 3 tablets.       . simethicone ([employer] GAS RELIEF) 80 MG chewable tablet CHEW 1 TABLET (80 MG TOTAL) BY MOUTH EVERY 6 (SIX) HOURS AS NEEDED FOR FLATULENCE.  100 tablet  5  . pregabalin (LYRICA) 50 MG capsule Take 1 capsule (50 mg total) by mouth 3 (three) times daily.  84 capsule  0   No facility-administered medications prior to visit.    PAST MEDICAL HISTORY: Past Medical History  Diagnosis Date  . GERD (gastroesophageal reflux disease)   . Hyperlipidemia   . Hyperthyroidism   . Benign fundic gland polyps of stomach   . IBS (irritable bowel syndrome)   . Arthritis   . Fibromyalgia   . Fatty liver     PAST SURGICAL HISTORY: Past Surgical History  Procedure Laterality Date  . Cholecystectomy  2009    low GB EF, no gallstones  . Vaginal hysterectomy    . Laparoscopy      of adhesions    FAMILY HISTORY: Family History  Problem Relation Age of Onset  . Colon cancer Father   .  Diabetes Brother     Half  . Heart disease Brother   . Heart disease Mother   . Diabetes Sister     SOCIAL HISTORY:  History   Social History  . Marital Status: Married    Spouse Name: N/A    Number of Children: N/A  . Years of Education: N/A   Occupational History  . Financial SVCS at a [employer]    Social History Main Topics  . Smoking status: Former Smoker -- 0.10 packs/day for 10 years    Types: Cigarettes    Quit date: 07/14/1981  . Smokeless tobacco: Never Used  . Alcohol Use: No  . Drug Use: No  . Sexually Active: Not Currently   Other Topics Concern  . Not on file   Social History Narrative  . No narrative on file     PHYSICAL EXAM  Filed Vitals:   09/25/12 0823  BP: 135/85    Pulse: 76  Temp: 97.9 F (36.6 C)  TempSrc: Oral  Height: 5\' 9"  (1.753 m)  Weight: 240 lb (108.863 kg)   Body mass index is 35.43 kg/(m^2).  GENERAL EXAM: Patient is in no distress; NECK SUPPLE. NEG SPURLINGS.   CARDIOVASCULAR: Regular rate and rhythm, no murmurs, no carotid bruits  NEUROLOGIC: MENTAL STATUS: awake, alert, language fluent, comprehension intact, naming intact CRANIAL NERVE: no papilledema on fundoscopic exam, pupils equal and reactive to light, visual fields full to confrontation, extraocular muscles intact, no nystagmus, facial sensation and strength symmetric, uvula midline, shoulder shrug symmetric, tongue midline. MOTOR: normal bulk and tone, full strength in the BUE, BLE SENSORY: normal and symmetric to light touch, pinprick, temperature COORDINATION: finger-nose-finger, fine finger movements normal REFLEXES: deep tendon reflexes present and symmetric GAIT/STATION: narrow based gait; able to walk on toes, heels and tandem; romberg is negative   DIAGNOSTIC DATA (LABS, IMAGING, TESTING) - I reviewed patient records, labs, notes, testing and imaging myself where available.  Lab Results  Component Value Date   WBC 4.8 09/14/2012   HGB 14.4 09/14/2012   HCT 42.9 09/14/2012   MCV 88.7 09/14/2012   PLT 237.0 05/06/2012      Component Value Date/Time   NA 138 09/14/2012 1356   K 4.0 09/14/2012 1356   CL 99 09/14/2012 1356   CO2 28 09/14/2012 1356   GLUCOSE 90 09/14/2012 1356   BUN 14 09/14/2012 1356   CREATININE 0.75 09/14/2012 1356   CREATININE 0.7 05/06/2012 1128   CALCIUM 10.4 09/14/2012 1356   PROT 8.1 09/14/2012 1356   ALBUMIN 4.8 09/14/2012 1356   AST 18 09/14/2012 1356   ALT 32 09/14/2012 1356   ALKPHOS 59 09/14/2012 1356   BILITOT 0.5 09/14/2012 1356   GFRNONAA 117.58 10/11/2009 0829   GFRAA 113 08/09/2008 0854   Lab Results  Component Value Date   CHOL 191 09/09/2011   HDL 54.00 09/09/2011   LDLCALC 107* 09/09/2011   TRIG 149.0 09/09/2011   CHOLHDL 4 09/09/2011    Lab Results  Component Value Date   HGBA1C 6.4 05/06/2012   Lab Results  Component Value Date   VITAMINB12 623 12/18/2010   Lab Results  Component Value Date   TSH 0.433 09/14/2012    05/31/08 MRI cervical spine - Central disc protrusion at C5-6 contacts the ventral cord but the central canal is only mildly narrowed at this level. Examination is otherwise unremarkable.   ASSESSMENT AND PLAN  57 y.o. year old female  has a past medical history of GERD (gastroesophageal  reflux disease); Hyperlipidemia; Hyperthyroidism; Benign fundic gland polyps of stomach; IBS (irritable bowel syndrome); Arthritis; Fibromyalgia; and Fatty liver. here with neck pain radiating to the left upper extremity. Also with symptoms suspicious for trigeminal neuralgia.  Differential diagnoses include cervical radiculopathy, degenerative spine changes, CNS inflammation, fibromyalgia, arthritis.  Alice check MRI brain and cervical spine for further evaluation. I will refer patient to physical therapy for conservative management. I would like her to try Cymbalta for symptom control. Patient agrees with plan.   Orders Placed This Encounter  Procedures  . MR Brain W Wo Contrast  . MR Cervical Spine W Wo Contrast  . Ambulatory referral to Physical Therapy     Meds ordered this encounter  Medications  . DULoxetine (CYMBALTA) 30 MG capsule    Sig: Take 1 capsule (30 mg total) by mouth daily.    Dispense:  7 capsule    Refill:  0  . DULoxetine (CYMBALTA) 60 MG capsule    Sig: Take 1 capsule (60 mg total) by mouth daily.    Dispense:  30 capsule    Refill:  6     Suanne Marker, MD 09/25/2012, 9:15 AM Certified in Neurology, Neurophysiology and Neuroimaging  [facility] [address], [address] [place], [place] [address] [phone]

## 2012-10-02 ENCOUNTER — Other Ambulatory Visit: Payer: Self-pay | Admitting: Internal Medicine

## 2012-10-12 ENCOUNTER — Encounter: Payer: Self-pay | Admitting: Gastroenterology

## 2012-10-13 ENCOUNTER — Telehealth: Payer: Self-pay | Admitting: Radiology

## 2012-10-13 NOTE — Telephone Encounter (Signed)
Patient did not have labs at Gyn appt, these are faxed for her [phone]

## 2012-11-10 ENCOUNTER — Ambulatory Visit (AMBULATORY_SURGERY_CENTER): Payer: BC Managed Care – PPO

## 2012-11-10 ENCOUNTER — Encounter: Payer: Self-pay | Admitting: Gastroenterology

## 2012-11-10 VITALS — Ht 68.0 in | Wt 241.2 lb

## 2012-11-10 DIAGNOSIS — R1013 Epigastric pain: Secondary | ICD-10-CM

## 2012-11-10 DIAGNOSIS — K219 Gastro-esophageal reflux disease without esophagitis: Secondary | ICD-10-CM

## 2012-11-24 ENCOUNTER — Ambulatory Visit (AMBULATORY_SURGERY_CENTER): Payer: BC Managed Care – PPO | Admitting: Gastroenterology

## 2012-11-24 ENCOUNTER — Encounter: Payer: Self-pay | Admitting: Gastroenterology

## 2012-11-24 VITALS — BP 138/91 | HR 71 | Temp 97.2°F | Resp 17 | Ht 68.0 in | Wt 241.0 lb

## 2012-11-24 DIAGNOSIS — R1013 Epigastric pain: Secondary | ICD-10-CM

## 2012-11-24 DIAGNOSIS — K2970 Gastritis, unspecified, without bleeding: Secondary | ICD-10-CM

## 2012-11-24 DIAGNOSIS — K219 Gastro-esophageal reflux disease without esophagitis: Secondary | ICD-10-CM

## 2012-11-24 MED ORDER — RANITIDINE HCL 150 MG PO TABS: 300.0000 mg | ORAL_TABLET | Freq: Two times a day (BID) | ORAL | Status: DC

## 2012-11-24 MED ORDER — SODIUM CHLORIDE 0.9 % IV SOLN: 500.0000 mL | INTRAVENOUS | Status: DC

## 2012-11-24 NOTE — Op Note (Signed)
[facility] [address] [place], [address]   ENDOSCOPY PROCEDURE REPORT  PATIENT: [name], [name]  MR#: [id number] BIRTHDATE: 1955/10/09 , 56  yrs. old GENDER: Female ENDOSCOPIST: Meryl Dare, MD, [facility] PROCEDURE DATE:  11/24/2012 PROCEDURE:  EGD w/ biopsy ASA CLASS:     Class II INDICATIONS:  Epigastric pain.   History of esophageal reflux. MEDICATIONS: MAC sedation, administered by CRNA and propofol (Diprivan) 300mg  IV TOPICAL ANESTHETIC: none DESCRIPTION OF PROCEDURE: After the risks benefits and alternatives of the procedure were thoroughly explained, informed consent was obtained.  The [id number] endoscope was introduced through the mouth and advanced to the second portion of the duodenum without limitations.  The instrument was slowly withdrawn as the mucosa was fully examined.  ESOPHAGUS: There was LA Class B esophagitis noted.  The esophagus was otherwise normal. STOMACH: Mild non-erosive gastritis (inflammation) was found in the gastric body and gastric fundus.  Multiple biopsies were performed. 10-20 polyps ranging between 2-4 mm in size were found in the gastric body and gastric fundus. Multiple biopsies were performed using cold forceps.   The stomach otherwise appeared normal. DUODENUM: The duodenal mucosa showed no abnormalities in the bulb and second portion of the duodenum.  Retroflexed views revealed a small hiatal hernia.     The scope was then withdrawn from the patient and the procedure completed.  COMPLICATIONS: There were no complications. ENDOSCOPIC IMPRESSION: 1.   LA Class B esophagitis 2.   Non-erosive gastritis in the body and fundus; multiple biopsies  3.   Multiple polyps ranging between 2-4 mm in the gastric body and gastric fundus  RECOMMENDATIONS: 1.  Anti-reflux regimen 2.  Await pathology results 3.  Encourage patient to take omeprazole 20 mg po qam or ranitidine 300 mg po bid    eSigned:  Meryl Dare, MD, [facility] 11/24/2012 2:35 PM

## 2012-11-24 NOTE — Progress Notes (Signed)
No complaints noted in the recovery room. Maw  Patient did not experience any of the following events: a burn prior to discharge; a fall within the facility; wrong site/side/patient/procedure/implant event; or a hospital transfer or hospital admission upon discharge from the facility. (G8907) Patient did not have preoperative order for IV antibiotic SSI prophylaxis. (G8918)  

## 2012-11-24 NOTE — Progress Notes (Signed)
Called to room to assist during endoscopic procedure.  Patient ID and intended procedure confirmed with present staff. Received instructions for my participation in the procedure from the performing physician. ewm 

## 2012-11-24 NOTE — Patient Instructions (Addendum)

## 2012-11-25 ENCOUNTER — Telehealth: Payer: Self-pay | Admitting: *Deleted

## 2012-11-25 NOTE — Telephone Encounter (Signed)
  Follow up Call-  Call back number 11/24/2012 11/27/2011  Post procedure Call Back phone  # [phone]  Permission to leave phone message Yes Yes     Patient questions:  Do you have a fever, pain , or abdominal swelling? no Pain Score  0 *  Have you tolerated food without any problems? yes  Have you been able to return to your normal activities? yes  Do you have any questions about your discharge instructions: Diet   no Medications  no Follow up visit  no  Do you have questions or concerns about your Care? no  Actions: * If pain score is 4 or above: No action needed, pain <4.pt denied actual pain but did say she has swollen lip on right side (denied broken skin on lip) and said her neck or jawline is sore. Encouraged pt to call us back if this does not improve as day goes on or by tomorrow. Told pt felt like this could be due to holding if her airway during procedure and possibly pressure from bite block on her lip.

## 2012-12-07 ENCOUNTER — Encounter: Payer: Self-pay | Admitting: Gastroenterology

## 2012-12-15 ENCOUNTER — Other Ambulatory Visit: Payer: Self-pay | Admitting: Family Medicine

## 2012-12-16 NOTE — Telephone Encounter (Signed)
Do you want to give pt any RFs or does she need to RTC?

## 2012-12-23 LAB — HM PAP SMEAR: HM Pap smear: NORMAL

## 2013-01-07 LAB — LIPID PANEL
LDL Cholesterol: 172 mg/dL
Triglycerides: 171 mg/dL — AB (ref 40–160)

## 2013-01-09 ENCOUNTER — Other Ambulatory Visit: Payer: Self-pay | Admitting: Internal Medicine

## 2013-01-28 ENCOUNTER — Other Ambulatory Visit (INDEPENDENT_AMBULATORY_CARE_PROVIDER_SITE_OTHER): Payer: BC Managed Care – PPO

## 2013-01-28 ENCOUNTER — Ambulatory Visit (INDEPENDENT_AMBULATORY_CARE_PROVIDER_SITE_OTHER): Payer: BC Managed Care – PPO | Admitting: Internal Medicine

## 2013-01-28 ENCOUNTER — Encounter: Payer: Self-pay | Admitting: Internal Medicine

## 2013-01-28 VITALS — BP 124/78 | HR 72 | Temp 98.7°F | Resp 16 | Ht 68.0 in | Wt 240.0 lb

## 2013-01-28 DIAGNOSIS — IMO0001 Reserved for inherently not codable concepts without codable children: Secondary | ICD-10-CM | POA: Insufficient documentation

## 2013-01-28 DIAGNOSIS — I10 Essential (primary) hypertension: Secondary | ICD-10-CM

## 2013-01-28 DIAGNOSIS — E119 Type 2 diabetes mellitus without complications: Secondary | ICD-10-CM

## 2013-01-28 DIAGNOSIS — E785 Hyperlipidemia, unspecified: Secondary | ICD-10-CM

## 2013-01-28 DIAGNOSIS — E66811 Obesity, class 1: Secondary | ICD-10-CM | POA: Insufficient documentation

## 2013-01-28 LAB — HEMOGLOBIN A1C: Hgb A1c MFr Bld: 6.2 % (ref 4.6–6.5)

## 2013-01-28 MED ORDER — HYDROCHLOROTHIAZIDE 25 MG PO TABS: 25.0000 mg | ORAL_TABLET | Freq: Every day | ORAL | Status: DC

## 2013-01-28 MED ORDER — PRAVASTATIN SODIUM 80 MG PO TABS: 80.0000 mg | ORAL_TABLET | Freq: Every day | ORAL | Status: DC

## 2013-01-28 NOTE — Progress Notes (Signed)
  Subjective:    Patient ID: [name], female    DOB: [birth year], 57 y.o.   MRN: [id number]  Hypertension This is a chronic problem. The current episode started more than 1 year ago. The problem has been gradually improving since onset. The problem is controlled. Pertinent negatives include no anxiety, blurred vision, chest pain, headaches, malaise/fatigue, neck pain, orthopnea, palpitations, peripheral edema, PND, shortness of breath or sweats. Agents associated with hypertension include estrogens. Past treatments include diuretics. The current treatment provides moderate improvement. Compliance problems include exercise and diet.       Review of Systems  Constitutional: Negative.  Negative for fever, chills, malaise/fatigue, diaphoresis, activity change, appetite change, fatigue and unexpected weight change.  HENT: Negative.  Negative for neck pain.   Eyes: Negative.  Negative for blurred vision.  Respiratory: Negative.  Negative for apnea, cough, choking, chest tightness, shortness of breath, wheezing and stridor.   Cardiovascular: Negative.  Negative for chest pain, palpitations, orthopnea, leg swelling and PND.  Gastrointestinal: Negative for nausea, vomiting, abdominal pain, diarrhea and constipation.  Endocrine: Negative.  Negative for polydipsia, polyphagia and polyuria.  Genitourinary: Negative.   Musculoskeletal: Negative.  Negative for myalgias, back pain, joint swelling and gait problem.  Skin: Negative.   Allergic/Immunologic: Negative.   Neurological: Negative.  Negative for dizziness, tremors, seizures, speech difficulty, weakness, light-headedness and headaches.  Hematological: Negative.  Negative for adenopathy. Does not bruise/bleed easily.  Psychiatric/Behavioral: Negative.        Objective:   Physical Exam  Vitals reviewed. Constitutional: She is oriented to person, place, and time. She appears well-developed and well-nourished. No distress.  HENT:  Head:  Normocephalic and atraumatic.  Mouth/Throat: Oropharynx is clear and moist. No oropharyngeal exudate.  Eyes: Conjunctivae are normal. Right eye exhibits no discharge. Left eye exhibits no discharge. No scleral icterus.  Neck: Normal range of motion. Neck supple. No JVD present. No tracheal deviation present. No thyromegaly present.  Cardiovascular: Normal rate, regular rhythm, normal heart sounds and intact distal pulses.  Exam reveals no gallop and no friction rub.   No murmur heard. Pulmonary/Chest: Effort normal and breath sounds normal. No stridor. No respiratory distress. She has no wheezes. She has no rales. She exhibits no tenderness.  Abdominal: Soft. Bowel sounds are normal. She exhibits no distension and no mass. There is no tenderness. There is no rebound and no guarding.  Musculoskeletal: Normal range of motion. She exhibits no edema and no tenderness.  Lymphadenopathy:    She has no cervical adenopathy.  Neurological: She is oriented to person, place, and time.  Skin: Skin is warm and dry. No rash noted. She is not diaphoretic. No erythema. No pallor.  Psychiatric: She has a normal mood and affect. Her behavior is normal. Judgment and thought content normal.      Lab Results  Component Value Date   WBC 4.8 09/14/2012   HGB 14.4 09/14/2012   HCT 42.9 09/14/2012   PLT 237.0 05/06/2012   GLUCOSE 90 09/14/2012   CHOL 254* 01/07/2013   TRIG 171* 01/07/2013   HDL 40 01/07/2013   LDLCALC 409 01/07/2013   ALT 32 09/14/2012   AST 18 09/14/2012   NA 138 09/14/2012   K 4.0 09/14/2012   CL 99 09/14/2012   CREATININE 0.75 09/14/2012   BUN 14 09/14/2012   CO2 28 09/14/2012   TSH 0.433 09/14/2012   HGBA1C 6.4 05/06/2012   MICROALBUR 1.6 08/09/2008      Assessment & Plan:

## 2013-01-28 NOTE — Patient Instructions (Signed)

## 2013-01-28 NOTE — Assessment & Plan Note (Signed)
She is willing to start a statin

## 2013-01-28 NOTE — Assessment & Plan Note (Signed)
I will check her A1C and will treat if needed 

## 2013-01-28 NOTE — Assessment & Plan Note (Signed)
She is trying her lifestyle modifications and she wants to be referred to consider bariatric surgery

## 2013-01-28 NOTE — Assessment & Plan Note (Signed)
Her BP is well controlled Today I will check her lytes and renal function 

## 2013-01-29 ENCOUNTER — Encounter: Payer: Self-pay | Admitting: Internal Medicine

## 2013-01-29 LAB — COMPREHENSIVE METABOLIC PANEL
ALT: 28 U/L (ref 0–35)
AST: 15 U/L (ref 0–37)
Albumin: 4.2 g/dL (ref 3.5–5.2)
Alkaline Phosphatase: 52 U/L (ref 39–117)
BUN: 12 mg/dL (ref 6–23)
CO2: 29 mEq/L (ref 19–32)
Calcium: 9.5 mg/dL (ref 8.4–10.5)
Chloride: 100 mEq/L (ref 96–112)
Creatinine, Ser: 0.7 mg/dL (ref 0.4–1.2)
GFR: 103.6 mL/min (ref >60.00)
Glucose, Bld: 98 mg/dL (ref 70–99)
Potassium: 3.9 mEq/L (ref 3.5–5.1)
Sodium: 137 mEq/L (ref 135–145)
Total Bilirubin: 0.6 mg/dL (ref 0.3–1.2)
Total Protein: 7.8 g/dL (ref 6.0–8.3)

## 2013-01-29 LAB — TSH: TSH: 0.67 u[IU]/mL (ref 0.35–5.50)

## 2013-03-25 ENCOUNTER — Encounter: Payer: Self-pay | Admitting: Internal Medicine

## 2013-03-25 ENCOUNTER — Ambulatory Visit (INDEPENDENT_AMBULATORY_CARE_PROVIDER_SITE_OTHER): Payer: BC Managed Care – PPO | Admitting: Internal Medicine

## 2013-03-25 VITALS — BP 124/78 | HR 80 | Temp 98.5°F | Wt 239.5 lb

## 2013-03-25 DIAGNOSIS — N3090 Cystitis, unspecified without hematuria: Secondary | ICD-10-CM

## 2013-03-25 DIAGNOSIS — M549 Dorsalgia, unspecified: Secondary | ICD-10-CM

## 2013-03-25 DIAGNOSIS — R300 Dysuria: Secondary | ICD-10-CM

## 2013-03-25 LAB — POCT URINALYSIS DIPSTICK
Bilirubin, UA: NEGATIVE
Blood, UA: NEGATIVE
Glucose, UA: NEGATIVE
Ketones, UA: NEGATIVE
Nitrite, UA: NEGATIVE
Protein, UA: NEGATIVE
Spec Grav, UA: >=1.03
Urobilinogen, UA: NEGATIVE
pH, UA: 7

## 2013-03-25 MED ORDER — CYCLOBENZAPRINE HCL 10 MG PO TABS: ORAL_TABLET | ORAL | Status: DC

## 2013-03-25 NOTE — Patient Instructions (Signed)
Back Exercises These exercises may help you when beginning to rehabilitate your injury. Your symptoms may resolve with or without further involvement from your physician, physical therapist or athletic trainer. While completing these exercises, remember:   Restoring tissue flexibility helps normal motion to return to the joints. This allows healthier, less painful movement and activity.  An effective stretch should be held for at least 30 seconds.  A stretch should never be painful. You should only feel a gentle lengthening or release in the stretched tissue. STRETCH  Extension, Prone on Elbows   Lie on your stomach on the floor, a bed will be too soft. Place your palms about shoulder width apart and at the height of your head.  Place your elbows under your shoulders. If this is too painful, stack pillows under your chest.  Allow your body to relax so that your hips drop lower and make contact more completely with the floor.  Hold this position for __________ seconds.  Slowly return to lying flat on the floor. Repeat __________ times. Complete this exercise __________ times per day.  RANGE OF MOTION  Extension, Prone Press Ups   Lie on your stomach on the floor, a bed will be too soft. Place your palms about shoulder width apart and at the height of your head.  Keeping your back as relaxed as possible, slowly straighten your elbows while keeping your hips on the floor. You may adjust the placement of your hands to maximize your comfort. As you gain motion, your hands will come more underneath your shoulders.  Hold this position __________ seconds.  Slowly return to lying flat on the floor. Repeat __________ times. Complete this exercise __________ times per day.  RANGE OF MOTION- Quadruped, Neutral Spine   Assume a hands and knees position on a firm surface. Keep your hands under your shoulders and your knees under your hips. You may place padding under your knees for comfort.  Drop  your head and point your tail bone toward the ground below you. This will round out your low back like an angry cat. Hold this position for __________ seconds.  Slowly lift your head and release your tail bone so that your back sags into a large arch, like an old horse.  Hold this position for __________ seconds.  Repeat this until you feel limber in your low back.  Now, find your "sweet spot." This will be the most comfortable position somewhere between the two previous positions. This is your neutral spine. Once you have found this position, tense your stomach muscles to support your low back.  Hold this position for __________ seconds. Repeat __________ times. Complete this exercise __________ times per day.  STRETCH  Flexion, Single Knee to Chest   Lie on a firm bed or floor with both legs extended in front of you.  Keeping one leg in contact with the floor, bring your opposite knee to your chest. Hold your leg in place by either grabbing behind your thigh or at your knee.  Pull until you feel a gentle stretch in your low back. Hold __________ seconds.  Slowly release your grasp and repeat the exercise with the opposite side. Repeat __________ times. Complete this exercise __________ times per day.  STRETCH - Hamstrings, Standing  Stand or sit and extend your right / left leg, placing your foot on a chair or foot stool  Keeping a slight arch in your low back and your hips straight forward.  Lead with your chest and   lean forward at the waist until you feel a gentle stretch in the back of your right / left knee or thigh. (When done correctly, this exercise requires leaning only a small distance.)  Hold this position for __________ seconds. Repeat __________ times. Complete this stretch __________ times per day. STRENGTHENING  Deep Abdominals, Pelvic Tilt   Lie on a firm bed or floor. Keeping your legs in front of you, bend your knees so they are both pointed toward the ceiling and  your feet are flat on the floor.  Tense your lower abdominal muscles to press your low back into the floor. This motion will rotate your pelvis so that your tail bone is scooping upwards rather than pointing at your feet or into the floor.  With a gentle tension and even breathing, hold this position for __________ seconds. Repeat __________ times. Complete this exercise __________ times per day.  STRENGTHENING  Abdominals, Crunches   Lie on a firm bed or floor. Keeping your legs in front of you, bend your knees so they are both pointed toward the ceiling and your feet are flat on the floor. Cross your arms over your chest.  Slightly tip your chin down without bending your neck.  Tense your abdominals and slowly lift your trunk high enough to just clear your shoulder blades. Lifting higher can put excessive stress on the low back and does not further strengthen your abdominal muscles.  Control your return to the starting position. Repeat __________ times. Complete this exercise __________ times per day.  STRENGTHENING  Quadruped, Opposite UE/LE Lift   Assume a hands and knees position on a firm surface. Keep your hands under your shoulders and your knees under your hips. You may place padding under your knees for comfort.  Find your neutral spine and gently tense your abdominal muscles so that you can maintain this position. Your shoulders and hips should form a rectangle that is parallel with the floor and is not twisted.  Keeping your trunk steady, lift your right hand no higher than your shoulder and then your left leg no higher than your hip. Make sure you are not holding your breath. Hold this position __________ seconds.  Continuing to keep your abdominal muscles tense and your back steady, slowly return to your starting position. Repeat with the opposite arm and leg. Repeat __________ times. Complete this exercise __________ times per day. Document Released: 06/07/2005 Document  Revised: 08/12/2011 Document Reviewed: 09/01/2008 [employer] Patient Information 2014 [employer].  

## 2013-03-25 NOTE — Progress Notes (Signed)
HPI  Pt presents to the clinic today with c/o urinary urgency, frequency, dysuria and back pain. This started 2 days ago but seem to be better today. She has taken Aleve OTC and cranberry pills, this has helped. She thinks she may have a UTI but is not sure. She denies any injury to her back.   Review of Systems  Past Medical History  Diagnosis Date  . GERD (gastroesophageal reflux disease)   . Hyperlipidemia   . Hyperthyroidism   . Benign fundic gland polyps of stomach   . IBS (irritable bowel syndrome)   . Arthritis   . Fibromyalgia   . Fatty liver     Family History  Problem Relation Age of Onset  . Colon cancer Father   . Diabetes Brother     Half  . Heart disease Brother   . Heart disease Mother   . Diabetes Sister     History   Social History  . Marital Status: Married    Spouse Name: N/A    Number of Children: N/A  . Years of Education: N/A   Occupational History  . Financial SVCS at a [employer]    Social History Main Topics  . Smoking status: Former Smoker -- 0.10 packs/day for 10 years    Types: Cigarettes    Quit date: 07/14/1981  . Smokeless tobacco: Never Used  . Alcohol Use: No  . Drug Use: No  . Sexual Activity: Not Currently   Other Topics Concern  . Not on file   Social History Narrative  . No narrative on file    Allergies  Allergen Reactions  . Crestor [Rosuvastatin Calcium]     Muscle aches  . Metformin And Related     abd pain    Constitutional: Denies fever, malaise, fatigue, headache or abrupt weight changes.   GU: Pt reports urgency, frequency and pain with urination. Denies burning sensation, blood in urine, odor or discharge. Skin: Denies redness, rashes, lesions or ulcercations.   No other specific complaints in a complete review of systems (except as listed in HPI above).    Objective:   Physical Exam  BP 124/78  Pulse 80  Temp(Src) 98.5 F (36.9 C) (Oral)  Wt 239 lb 8 oz (108.636 kg)  BMI 36.42 kg/m2  SpO2  98% Wt Readings from Last 3 Encounters:  03/25/13 239 lb 8 oz (108.636 kg)  01/28/13 240 lb (108.863 kg)  11/24/12 241 lb (109.317 kg)    General: Appears her stated age, well developed, well nourished in NAD. Cardiovascular: Normal rate and rhythm. S1,S2 noted.  No murmur, rubs or gallops noted. No JVD or BLE edema. No carotid bruits noted. Pulmonary/Chest: Normal effort and positive vesicular breath sounds. No respiratory distress. No wheezes, rales or ronchi noted.  Abdomen: Soft and nontender. Normal bowel sounds, no bruits noted. No distention or masses noted. Liver, spleen and kidneys non palpable. Tender to palpation over the bladder area. No CVA tenderness.      Assessment & Plan:   Urgency, frequency and dysuria secondary to cystitis:  Urinalysis- small leuks Continue advil for back pain- likely MSK related Pt request refill of flexeril today Drink plenty of fluids  RTC as needed or if symptoms persist.

## 2013-04-05 ENCOUNTER — Telehealth: Payer: Self-pay | Admitting: Diagnostic Neuroimaging

## 2013-04-05 NOTE — Telephone Encounter (Signed)
reschedule appt per penumalli, patient said that the appt was not needed at this time and she will call back when needed.

## 2013-04-08 ENCOUNTER — Other Ambulatory Visit: Payer: Self-pay

## 2013-04-21 ENCOUNTER — Ambulatory Visit: Payer: BC Managed Care – PPO | Admitting: Diagnostic Neuroimaging

## 2013-04-23 ENCOUNTER — Other Ambulatory Visit: Payer: Self-pay | Admitting: Internal Medicine

## 2013-05-16 ENCOUNTER — Other Ambulatory Visit: Payer: Self-pay | Admitting: Internal Medicine

## 2013-06-09 ENCOUNTER — Other Ambulatory Visit: Payer: Self-pay | Admitting: Internal Medicine

## 2013-06-11 ENCOUNTER — Encounter: Payer: Self-pay | Admitting: Internal Medicine

## 2013-06-11 ENCOUNTER — Ambulatory Visit (INDEPENDENT_AMBULATORY_CARE_PROVIDER_SITE_OTHER): Payer: BC Managed Care – PPO | Admitting: Internal Medicine

## 2013-06-11 ENCOUNTER — Other Ambulatory Visit (INDEPENDENT_AMBULATORY_CARE_PROVIDER_SITE_OTHER): Payer: BC Managed Care – PPO

## 2013-06-11 VITALS — BP 128/90 | HR 80 | Temp 98.2°F | Resp 16 | Ht 65.0 in | Wt 233.0 lb

## 2013-06-11 DIAGNOSIS — R51 Headache: Secondary | ICD-10-CM

## 2013-06-11 DIAGNOSIS — IMO0001 Reserved for inherently not codable concepts without codable children: Secondary | ICD-10-CM

## 2013-06-11 DIAGNOSIS — G4700 Insomnia, unspecified: Secondary | ICD-10-CM

## 2013-06-11 DIAGNOSIS — E1165 Type 2 diabetes mellitus with hyperglycemia: Principal | ICD-10-CM

## 2013-06-11 DIAGNOSIS — I10 Essential (primary) hypertension: Secondary | ICD-10-CM

## 2013-06-11 DIAGNOSIS — R519 Headache, unspecified: Secondary | ICD-10-CM

## 2013-06-11 LAB — COMPREHENSIVE METABOLIC PANEL
ALK PHOS: 50 U/L (ref 39–117)
ALT: 29 U/L (ref 0–35)
AST: 14 U/L (ref 0–37)
Albumin: 4.3 g/dL (ref 3.5–5.2)
BUN: 10 mg/dL (ref 6–23)
CO2: 29 mEq/L (ref 19–32)
CREATININE: 0.8 mg/dL (ref 0.4–1.2)
Calcium: 9.6 mg/dL (ref 8.4–10.5)
Chloride: 102 mEq/L (ref 96–112)
GFR: 95.95 mL/min (ref >60.00)
GLUCOSE: 96 mg/dL (ref 70–99)
Potassium: 3.8 mEq/L (ref 3.5–5.1)
Sodium: 139 mEq/L (ref 135–145)
Total Bilirubin: 0.9 mg/dL (ref 0.3–1.2)
Total Protein: 7.8 g/dL (ref 6.0–8.3)

## 2013-06-11 LAB — CBC WITH DIFFERENTIAL/PLATELET
BASOS PCT: 0.3 % (ref 0.0–3.0)
Basophils Absolute: 0 10*3/uL (ref 0.0–0.1)
EOS ABS: 0.1 10*3/uL (ref 0.0–0.7)
EOS PCT: 1.2 % (ref 0.0–5.0)
HCT: 40.5 % (ref 36.0–46.0)
Hemoglobin: 14.3 g/dL (ref 12.0–15.0)
Lymphocytes Relative: 55 % — ABNORMAL HIGH (ref 12.0–46.0)
Lymphs Abs: 3.1 10*3/uL (ref 0.7–4.0)
MCHC: 35.3 g/dL (ref 30.0–36.0)
MCV: 87.8 fl (ref 78.0–100.0)
MONO ABS: 0.5 10*3/uL (ref 0.1–1.0)
Monocytes Relative: 8.9 % (ref 3.0–12.0)
Neutro Abs: 2 10*3/uL (ref 1.4–7.7)
Neutrophils Relative %: 34.6 % — ABNORMAL LOW (ref 43.0–77.0)
Platelets: 241 10*3/uL (ref 150.0–400.0)
RBC: 4.61 Mil/uL (ref 3.87–5.11)
RDW: 14.2 % (ref 11.5–14.6)
WBC: 5.7 10*3/uL (ref 4.5–10.5)

## 2013-06-11 LAB — TSH: TSH: 0.46 u[IU]/mL (ref 0.35–5.50)

## 2013-06-11 LAB — T3, FREE: T3, Free: 3 pg/mL (ref 2.3–4.2)

## 2013-06-11 LAB — HEMOGLOBIN A1C: Hgb A1c MFr Bld: 6 % (ref 4.6–6.5)

## 2013-06-11 MED ORDER — DOXEPIN HCL 6 MG PO TABS: 1.0000 | ORAL_TABLET | Freq: Every evening | ORAL | Status: DC | PRN

## 2013-06-11 NOTE — Progress Notes (Signed)
Subjective:    Patient ID: [name], female    DOB: June 25, 1955, 58 y.o.   MRN: [id number]  Headache  This is a new problem. The current episode started more than 1 month ago. The problem occurs intermittently. The problem has been unchanged. The pain is located in the bilateral region. The pain does not radiate. The pain quality is similar to prior headaches. The quality of the pain is described as aching. The pain is at a severity of 1/10. The pain is mild. Associated symptoms include abdominal pain (diffuse, chronic, intermittent) and insomnia. Pertinent negatives include no abnormal behavior, anorexia, back pain, blurred vision, coughing, dizziness, drainage, ear pain, eye pain, eye redness, eye watering, facial sweating, fever, hearing loss, loss of balance, muscle aches, nausea, neck pain, numbness, phonophobia, photophobia, rhinorrhea, scalp tenderness, seizures, sinus pressure, sore throat, swollen glands, tingling, tinnitus, visual change, vomiting, weakness or weight loss. Nothing aggravates the symptoms. She has tried nothing for the symptoms.      Review of Systems  Constitutional: Positive for fatigue. Negative for fever, chills, weight loss, diaphoresis and appetite change.  HENT: Negative for ear pain, hearing loss, rhinorrhea, sinus pressure, sore throat and tinnitus.   Eyes: Negative.  Negative for blurred vision, photophobia, pain and redness.  Respiratory: Negative.  Negative for cough, choking, chest tightness, shortness of breath, wheezing and stridor.   Cardiovascular: Negative.  Negative for chest pain, palpitations and leg swelling.  Gastrointestinal: Positive for abdominal pain (diffuse, chronic, intermittent). Negative for nausea, vomiting, diarrhea, constipation, blood in stool, abdominal distention, anal bleeding, rectal pain and anorexia.  Endocrine: Negative.   Genitourinary: Negative.   Musculoskeletal: Negative.  Negative for back pain and neck pain.  Skin:  Negative.   Allergic/Immunologic: Negative.   Neurological: Positive for headaches. Negative for dizziness, tingling, seizures, weakness, numbness and loss of balance.  Hematological: Negative.  Negative for adenopathy. Does not bruise/bleed easily.  Psychiatric/Behavioral: Positive for sleep disturbance (DFA and FA). Negative for suicidal ideas, hallucinations, behavioral problems, confusion, self-injury, dysphoric mood, decreased concentration and agitation. The patient has insomnia. The patient is not nervous/anxious and is not hyperactive.        Objective:   Physical Exam  Constitutional: She is oriented to person, place, and time. She appears well-developed and well-nourished.  Non-toxic appearance. She does not have a sickly appearance. She does not appear ill. No distress.  HENT:  Head: Normocephalic and atraumatic.  Mouth/Throat: Oropharynx is clear and moist. No oropharyngeal exudate.  Eyes: Conjunctivae and EOM are normal. Pupils are equal, round, and reactive to light. Right eye exhibits no discharge. Left eye exhibits no discharge. No scleral icterus.  Neck: Normal range of motion. Neck supple. No JVD present. No tracheal deviation present. No thyromegaly present.  Cardiovascular: Normal rate, regular rhythm, normal heart sounds and intact distal pulses.  Exam reveals no gallop and no friction rub.   No murmur heard. Pulmonary/Chest: Effort normal and breath sounds normal. No stridor. No respiratory distress. She has no wheezes. She has no rales. She exhibits no tenderness.  Abdominal: Soft. Bowel sounds are normal. She exhibits no distension and no mass. There is no tenderness. There is no rebound and no guarding.  Musculoskeletal: Normal range of motion. She exhibits no edema and no tenderness.  Lymphadenopathy:    She has no cervical adenopathy.  Neurological: She is alert and oriented to person, place, and time. She has normal strength and normal reflexes. She displays no  atrophy, no tremor and normal  reflexes. No cranial nerve deficit or sensory deficit. She exhibits normal muscle tone. She displays a negative Romberg sign. She displays no seizure activity. Coordination and gait normal.  Skin: Skin is warm and dry. No rash noted. She is not diaphoretic. No erythema. No pallor.  Psychiatric: She has a normal mood and affect. Her behavior is normal. Judgment and thought content normal.     Lab Results  Component Value Date   WBC 4.8 09/14/2012   HGB 14.4 09/14/2012   HCT 42.9 09/14/2012   PLT 237.0 05/06/2012   GLUCOSE 98 01/28/2013   CHOL 254* 01/07/2013   TRIG 171* 01/07/2013   HDL 40 01/07/2013   LDLCALC 172 01/07/2013   ALT 28 01/28/2013   AST 15 01/28/2013   NA 137 01/28/2013   K 3.9 01/28/2013   CL 100 01/28/2013   CREATININE 0.7 01/28/2013   BUN 12 01/28/2013   CO2 29 01/28/2013   TSH 0.67 01/28/2013   HGBA1C 6.2 01/28/2013   MICROALBUR 1.6 08/09/2008       Assessment & Plan:

## 2013-06-11 NOTE — Progress Notes (Signed)
Pre visit review using our clinic review tool, if applicable. No additional management support is needed unless otherwise documented below in the visit note. 

## 2013-06-11 NOTE — Patient Instructions (Signed)

## 2013-06-12 LAB — T4: T4, Total: 8.5 ug/dL (ref 5.0–12.5)

## 2013-06-13 ENCOUNTER — Other Ambulatory Visit: Payer: Self-pay | Admitting: Internal Medicine

## 2013-06-13 ENCOUNTER — Encounter: Payer: Self-pay | Admitting: Internal Medicine

## 2013-06-14 ENCOUNTER — Encounter: Payer: Self-pay | Admitting: Internal Medicine

## 2013-06-14 ENCOUNTER — Other Ambulatory Visit: Payer: Self-pay | Admitting: Internal Medicine

## 2013-06-14 DIAGNOSIS — R519 Headache, unspecified: Secondary | ICD-10-CM | POA: Insufficient documentation

## 2013-06-14 DIAGNOSIS — R51 Headache: Secondary | ICD-10-CM

## 2013-06-14 MED ORDER — COAL TAR 20 % EX SOLN: 1.0000 | Freq: Every day | CUTANEOUS | Status: DC

## 2013-06-14 NOTE — Assessment & Plan Note (Signed)
She has a benign sounding headache with a history of FMG and IBS  I will check her labs today to look for secondary causes and she will try to sleep better IF the headache persists then I will consider an MRI to look for structural causes

## 2013-06-14 NOTE — Assessment & Plan Note (Signed)
Her blood sugars are well controlled 

## 2013-06-14 NOTE — Assessment & Plan Note (Signed)
Her BP is well controlled Her lytes and renal function are normal today

## 2013-06-14 NOTE — Assessment & Plan Note (Signed)
I have have asked her to be seen by sleep medicine again She will try silenor to treat this

## 2013-06-15 ENCOUNTER — Ambulatory Visit (INDEPENDENT_AMBULATORY_CARE_PROVIDER_SITE_OTHER): Payer: BC Managed Care – PPO | Admitting: Internal Medicine

## 2013-06-15 ENCOUNTER — Encounter (INDEPENDENT_AMBULATORY_CARE_PROVIDER_SITE_OTHER): Payer: BC Managed Care – PPO | Admitting: Internal Medicine

## 2013-06-15 ENCOUNTER — Encounter: Payer: Self-pay | Admitting: Internal Medicine

## 2013-06-15 ENCOUNTER — Telehealth: Payer: Self-pay | Admitting: *Deleted

## 2013-06-15 VITALS — BP 122/68 | HR 75 | Ht 68.0 in | Wt 240.4 lb

## 2013-06-15 DIAGNOSIS — G4733 Obstructive sleep apnea (adult) (pediatric): Secondary | ICD-10-CM

## 2013-06-15 DIAGNOSIS — G4700 Insomnia, unspecified: Secondary | ICD-10-CM

## 2013-06-15 NOTE — Telephone Encounter (Signed)
Patient phoned stating pharmacy couldn't fill prescription without a form being filled out.  Attempted to return patient's phone call, but according to her encounter list, she is upstairs in an appt with Dr. Gwenette Greet.  Will continue to attempt to follow up with patient.

## 2013-06-15 NOTE — Progress Notes (Signed)
06/15/13- 36 yoF former smoker  Previously followed by Dr Gwenette Greet in 2013 for difficulty sleeping.

## 2013-06-15 NOTE — Patient Instructions (Addendum)
Order- Schedule split protocol NPSG to be read by Dr Gwenette Greet    Dx OSA with Insomnia  You will make an appointment at the front to follow up with Dr Gwenette Greet after your sleep study to go over the test

## 2013-06-15 NOTE — Progress Notes (Signed)
Subjective:    Patient ID: [name], female    DOB: Oct 27, 1955, 58 y.o.   MRN: [id number]  HPI 10/21/11- Dr Gwenette Greet The patient comes in today for followup of her sleeping issues.  At the last visit, I felt she had sleep disruption related to chronic pain, poor sleep hygiene, and could not exclude an underlying sleep disorder.  I have asked her to get off Ambien by using trazodone at bedtime, and also stressed to her various behavioral therapies to improve sleep hygiene.  The patient comes in today where she is not awakening as much, and is getting back to sleep quicker, but continues to have issues.  She feels that chronic pain is a major issue for her, and is responsible for her awakenings.  06/15/13- 58 yoFformer smoker follwed by Dr Gwenette Greet for Sleep- last seen in 2013.  Dr Ronnald Ramp' office sent her and she was processed as a new patient. Concern that she might need a sleep study. She complains of difficulty initiating and maintaining sleep. Her family tells her she coughs and make noises in sleep. Daytime sleepiness. She wakes in the morning with an irritated feeling in her chest but is not aware of reflux. Continues to Zantac twice daily. Tylenol PM at bedtime is of limited help. In the past she had used Ambien and trazodone. Recent prescription for doxepin but insurance would not cover. Bedtime between 10 and 11 PM with sleep latency one hour. She wakes 2 or 3 times during the night before getting up between 3 and 4 AM when she wakes up, unable to regain sleep. When she awakens during the night she will lie awake more get up and read. Estimates 5 hours of sleep. Little caffeine. No ENT surgery. No history of asthma or heart disease. Treated for hypertension and acid reflux. Sister snores.   ROS-See HPIHe Constitutional:   No-   weight loss, night sweats, fevers, chills, fatigue, lassitude. HEENT:   No-  headaches, difficulty swallowing, tooth/dental problems, sore throat,       No-  sneezing,  itching, ear ache, nasal congestion, post nasal drip,  CV:  No-   chest pain, orthopnea, PND, swelling in lower extremities, anasarca,                                  dizziness, palpitations Resp: No-   shortness of breath with exertion or at rest.              No-   productive cough, + non-productive cough,  No- coughing up of blood.              No-   change in color of mucus.  No- wheezing.   Skin: No-   rash or lesions. GI:  + heartburn, indigestion, no-abdominal pain, nausea, vomiting,  GU:  MS:  No-   joint pain or swelling.   Neuro-     nothing unusual Psych:  No- change in mood or affect. No depression or anxiety.  No memory loss.  OBJ- Physical Exam General- Alert, Oriented, Affect-appropriate, Distress- none acute,  overweight Skin- rash-none, lesions- none, excoriation- none Lymphadenopathy- none Head- atraumatic            Eyes- Gross vision intact, PERRLA, conjunctivae and secretions clear            Ears- Hearing, canals-normal            Nose-  Clear, no-Septal dev, mucus, polyps, erosion, perforation             Throat- Mallampati IV , mucosa clear , drainage- none, tonsils- atrophic Neck- flexible , trachea midline, no stridor , thyroid nl, carotid no bruit Chest - symmetrical excursion , unlabored           Heart/CV- RRR , no murmur , no gallop  , no rub, nl s1 s2                           - JVD- none , edema- none, stasis changes- none, varices- none           Lung- clear to P&A, wheeze- none, cough- none , dullness-none, rub- none           Chest wall-  Abd-  Br/ Gen/ Rectal- Not done, not indicated Extrem- cyanosis- none, clubbing, none, atrophy- none, strength- nl Neuro- grossly intact to observation             Objective:         Assessment & Plan:

## 2013-06-15 NOTE — Assessment & Plan Note (Signed)
Previously treated by Dr.Clance. Trazodone was used to help her get off of Ambien. Now using Tylenol PM but indicates little help maintaining sleep or preventing early morning waking. Body habitus looks consistent with obstructive sleep apnea. Direct information from witness of her sleep is not available. She came with a question from Dr. Ronnald Ramp about appropriateness of a sleep study. I discussed the process and she agrees. Plan-schedule split protocol sleep study to exclude obstructive sleep apnea. She will then return for followup with Dr. Gwenette Greet to reestablish his care.

## 2013-07-16 ENCOUNTER — Ambulatory Visit (HOSPITAL_BASED_OUTPATIENT_CLINIC_OR_DEPARTMENT_OTHER): Payer: BC Managed Care – PPO | Attending: Pulmonary Disease

## 2013-07-16 VITALS — Ht 68.0 in | Wt 240.0 lb

## 2013-07-16 DIAGNOSIS — G4730 Sleep apnea, unspecified: Principal | ICD-10-CM

## 2013-07-16 DIAGNOSIS — G4733 Obstructive sleep apnea (adult) (pediatric): Secondary | ICD-10-CM

## 2013-07-16 DIAGNOSIS — G4700 Insomnia, unspecified: Secondary | ICD-10-CM

## 2013-07-16 DIAGNOSIS — G4710 Hypersomnia, unspecified: Secondary | ICD-10-CM | POA: Insufficient documentation

## 2013-07-27 ENCOUNTER — Telehealth: Payer: Self-pay | Admitting: Pulmonary Disease

## 2013-07-27 DIAGNOSIS — G4733 Obstructive sleep apnea (adult) (pediatric): Secondary | ICD-10-CM

## 2013-07-27 DIAGNOSIS — G4700 Insomnia, unspecified: Secondary | ICD-10-CM

## 2013-07-27 NOTE — Telephone Encounter (Signed)
Pt needs ov to review sleep study results.  

## 2013-07-27 NOTE — Sleep Study (Signed)
   NAME: [name] DATE OF BIRTH:  11-15-1955 MEDICAL RECORD NUMBER [id number]  [facility]  PHYSICIAN: [place] OF STUDY: 07/16/2013  SLEEP STUDY TYPE: Nocturnal Polysomnogram               REFERRING PHYSICIAN: Malai Lady, Armando Reichert, MD  INDICATION FOR STUDY: Hypersomnia with sleep apnea  EPWORTH SLEEPINESS SCORE:  7 HEIGHT: 5\' 8"  (172.7 cm)  WEIGHT: 240 lb (108.863 kg)    Body mass index is 36.5 kg/(m^2).  NECK SIZE: 14 in.  MEDICATIONS: Reviewed in the chart  SLEEP ARCHITECTURE: The patient had a total sleep time of 308 minutes with no slow-wave sleep and only 66 minutes of REM. Sleep onset latency was normal at 8 minutes, and REM onset was normal at 104 minutes. Sleep efficiency was moderately reduced at 74%.  RESPIRATORY DATA: The patient was found to have 4 apneas, as well as 7 obstructive hypopneas, giving her an AHI of only 2 events per hour. The events occurred in all body positions, and there was moderate snoring noted throughout.  OXYGEN DATA: There was oxygen desaturation as low as 88% with the patient's obstructive events  CARDIAC DATA: Isolated PAC noted, but no clinically significant arrhythmias were seen  MOVEMENT/PARASOMNIA: The patient had no significant limb movements or abnormal behaviors seen.  IMPRESSION/ RECOMMENDATION:    1) Small numbers of obstructive events which do not meet the AHI criteria for the obstructive sleep apnea syndrome. The patient should be encouraged to work aggressively on weight loss.  2) The patient has a history of insomnia, however she had a sleep onset latency of only 8 minutes during the study.   Kathee Delton Diplomate, American Board of Sleep Medicine  ELECTRONICALLY SIGNED ON:  07/27/2013, 11:33 AM [place] PH: [phone]   FX: [phone] [place]

## 2013-07-28 NOTE — Telephone Encounter (Signed)
LMOM x 1 

## 2013-07-29 ENCOUNTER — Ambulatory Visit: Payer: BC Managed Care – PPO | Admitting: Pulmonary Disease

## 2013-08-04 NOTE — Telephone Encounter (Signed)
Patient scheduled for appt with [facility] to review sleep study 08/20/13 at 230

## 2013-08-20 ENCOUNTER — Ambulatory Visit (INDEPENDENT_AMBULATORY_CARE_PROVIDER_SITE_OTHER): Payer: BC Managed Care – PPO | Admitting: Pulmonary Disease

## 2013-08-20 ENCOUNTER — Encounter: Payer: Self-pay | Admitting: Pulmonary Disease

## 2013-08-20 VITALS — BP 118/76 | HR 68 | Temp 97.7°F | Ht 68.0 in | Wt 240.0 lb

## 2013-08-20 DIAGNOSIS — G4700 Insomnia, unspecified: Secondary | ICD-10-CM

## 2013-08-20 NOTE — Assessment & Plan Note (Signed)
Patient's sleep study shows no evidence for sleep disordered breathing, movement disorder, or other abnormal sleep behaviors. I suspect the patient's symptoms are simply related to insomnia. It is interesting that she fell asleep in the sleep Center within 8 minutes, but she has prolonged issues at home. This would seem to indicate a problem with psychophysiologic insomnia, and also sleep hygiene. The patient also states that she has issues with fibromyalgia, and that chronic pain often interferes with her sleep. At this point, I have offered to refer her to psychology for cognitive behavioral therapy, but she would like to work on some things on her own. If she is indeed having chronic pain to disrupt her sleep, perhaps this can be addressed.

## 2013-08-20 NOTE — Progress Notes (Signed)
   Subjective:    Patient ID: [name], female    DOB: [birth year], 59 y.o.   MRN: [id number]  HPI Patient comes in today for followup of her recent sleep study. This was done to evaluate issues with sleep onset and maintenance. Perseveration no evidence for clinically significant sleep disordered breathing, no movement disorder of sleep, no arrhythmias or abnormal behaviors. She actually fell asleep within 18 minutes, and had REM noted throughout the night. I have gone of the study with her in detail, and answered all of her questions.   Review of Systems  Constitutional: Negative for fever and unexpected weight change.  HENT: Negative for congestion, dental problem, ear pain, nosebleeds, postnasal drip, rhinorrhea, sinus pressure, sneezing, sore throat and trouble swallowing.   Eyes: Negative for redness and itching.  Respiratory: Negative for cough, chest tightness, shortness of breath and wheezing.   Cardiovascular: Negative for palpitations and leg swelling.  Gastrointestinal: Negative for nausea and vomiting.  Genitourinary: Negative for dysuria.  Musculoskeletal: Negative for joint swelling.  Skin: Negative for rash.  Neurological: Negative for headaches.  Hematological: Does not bruise/bleed easily.  Psychiatric/Behavioral: Negative for dysphoric mood. The patient is not nervous/anxious.        Objective:   Physical Exam Obese female in no acute distress Nose without purulence or discharge noted Neck without lymphadenopathy or thyromegaly Lower extremities with mild edema, no cyanosis Alert and oriented, does not appear to be sleepy, moves all 4 extremities.       Assessment & Plan:

## 2013-08-20 NOTE — Patient Instructions (Signed)
Your sleep study did not show sleep apnea or any other reason for your symptoms. Work on sleep hygiene, pain control, and can consider behavioral therapy (CBT) if this issue continues. Keep in mind reflux. followup with me as needed.

## 2013-08-27 ENCOUNTER — Ambulatory Visit (INDEPENDENT_AMBULATORY_CARE_PROVIDER_SITE_OTHER): Payer: BC Managed Care – PPO | Admitting: Family Medicine

## 2013-08-27 VITALS — BP 130/82 | HR 80 | Temp 97.9°F | Resp 18 | Ht 67.0 in | Wt 240.0 lb

## 2013-08-27 DIAGNOSIS — M5412 Radiculopathy, cervical region: Secondary | ICD-10-CM

## 2013-08-27 MED ORDER — PREDNISONE 20 MG PO TABS: 40.0000 mg | ORAL_TABLET | Freq: Every day | ORAL | Status: DC

## 2013-08-27 NOTE — Progress Notes (Signed)
Subjective:    Patient ID: [name], female    DOB: 1955-07-02, 58 y.o.   MRN: [id number]  HPI Scribed for Robyn Haber MD, the patient was seen in room 11. This chart was scribed by Denice Bors, ED scribe. Patient's care was started at 1:37 PM  HPI Comments: Hx was provided by the pt.  [name] is a 58 y.o. female who presents to the [facility] complaining of superficial hypersensitivity located on axilla with radiating to the to medial forearm onset 1 week. Describes pain as mild in severity. Reports associated constant right upper back pain. Reports tenderness is exacerbated at night. Reports taking ibuprofen with mild relief of symptoms.Denies any alleviating factors. Denies associated rash, and fever.  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   MMG is up to date   Past Medical History  Diagnosis Date   GERD (gastroesophageal reflux disease)    Hyperlipidemia    Hyperthyroidism    Benign fundic gland polyps of stomach    IBS (irritable bowel syndrome)    Arthritis    Fibromyalgia    Fatty liver     Past Surgical History  Procedure Laterality Date   Cholecystectomy  2009    low GB EF, no gallstones   Vaginal hysterectomy     Laparoscopy      of adhesions/ of ovaries    Family History  Problem Relation Age of Onset   Colon cancer Father    Diabetes Brother     Half   Heart disease Brother    Heart disease Mother    Diabetes Sister     History   Social History   Marital Status: Married    Spouse Name: N/A    Number of Children: N/A   Years of Education: N/A   Occupational History   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    Social History Main Topics   Smoking status: Former Smoker -- 0.10 packs/day for 10 years    Types: Cigarettes    Quit date: 07/14/1981   Smokeless tobacco: Never Used   Alcohol Use: No   Drug Use: No   Sexual Activity: Not Currently   Other Topics Concern   Not  on file   Social History Narrative   No narrative on file    Allergies  Allergen Reactions   Crestor [Rosuvastatin Calcium]     Muscle aches   Metformin And Related     abd pain    Patient Active Problem List   Diagnosis Date Noted   Headache 06/14/2013   Obesity, Class II, BMI 35-39.9, with comorbidity 01/28/2013   DJD (degenerative joint disease) of knee 11/08/2011   Persistent disorder of initiating or maintaining sleep 09/30/2011   BACK PAIN, LUMBAR, WITH RADICULOPATHY 08/09/2009   HYPERTENSION, BENIGN ESSENTIAL 08/03/2009   FATTY LIVER DISEASE 10/25/2008   FIBROMYALGIA 10/25/2008   Type II or unspecified type diabetes mellitus without mention of complication, uncontrolled 05/25/2008   Hyperlipidemia LDL goal < 100 05/25/2008   GERD 05/25/2008   Irritable bowel syndrome 05/25/2008    Filed Vitals:   08/27/13 1328  BP: 130/82  Pulse: 80  Temp: 97.9 F (36.6 C)  TempSrc: Oral  Resp: 18  Height: 5\' 7"  (1.702 m)  Weight: 240 lb (108.863 kg)  SpO2: 99%        Review of Systems  Constitutional: Negative for fever.  Musculoskeletal: Positive for myalgias.  Skin: Negative for rash.  Psychiatric/Behavioral:  Negative for confusion.       Objective:   Physical Exam  Nursing note and vitals reviewed. Constitutional: She is oriented to person, place, and time. She appears well-developed and well-nourished. No distress.  HENT:  Head: Normocephalic and atraumatic.  Eyes: EOM are normal.  Neck: Normal range of motion and full passive range of motion without pain. Neck supple. No tracheal deviation present.  Cardiovascular: Normal rate and regular rhythm.   Pulmonary/Chest: Effort normal and breath sounds normal. No respiratory distress.  Musculoskeletal: Normal range of motion. She exhibits no tenderness.  No midline C-spine, T-spine, or L-spine tenderness with no step-offs or deformities noted    No focal tenderness     Neurological: She is  alert and oriented to person, place, and time. She has normal reflexes. No sensory deficit.  Skin: Skin is warm and dry. No rash noted.  Psychiatric: She has a normal mood and affect. Her behavior is normal.       Assessment & Plan:   Cervical radiculitis - Plan: predniSONE (DELTASONE) 20 MG tablet  Signed, Robyn Haber, MD

## 2013-09-18 ENCOUNTER — Other Ambulatory Visit: Payer: Self-pay | Admitting: Internal Medicine

## 2013-10-15 ENCOUNTER — Other Ambulatory Visit: Payer: Self-pay | Admitting: Obstetrics and Gynecology

## 2013-10-15 DIAGNOSIS — N644 Mastodynia: Secondary | ICD-10-CM

## 2013-10-19 ENCOUNTER — Ambulatory Visit
Admission: RE | Admit: 2013-10-19 | Discharge: 2013-10-19 | Disposition: A | Discharge status: Home / Self Care | Payer: BC Managed Care – PPO | Source: Ambulatory Visit | Attending: Obstetrics and Gynecology | Admitting: Obstetrics and Gynecology

## 2013-10-19 DIAGNOSIS — N644 Mastodynia: Secondary | ICD-10-CM

## 2013-11-09 ENCOUNTER — Encounter: Payer: Self-pay | Admitting: Internal Medicine

## 2013-11-09 ENCOUNTER — Ambulatory Visit (INDEPENDENT_AMBULATORY_CARE_PROVIDER_SITE_OTHER): Payer: BC Managed Care – PPO | Admitting: Internal Medicine

## 2013-11-09 VITALS — BP 140/86 | HR 74 | Temp 97.8°F | Wt 240.4 lb

## 2013-11-09 DIAGNOSIS — R112 Nausea with vomiting, unspecified: Secondary | ICD-10-CM

## 2013-11-09 DIAGNOSIS — K219 Gastro-esophageal reflux disease without esophagitis: Secondary | ICD-10-CM

## 2013-11-09 DIAGNOSIS — H6120 Impacted cerumen, unspecified ear: Secondary | ICD-10-CM

## 2013-11-09 DIAGNOSIS — K589 Irritable bowel syndrome without diarrhea: Secondary | ICD-10-CM

## 2013-11-09 MED ORDER — SUCRALFATE 1 G PO TABS: 1.0000 g | ORAL_TABLET | Freq: Three times a day (TID) | ORAL | Status: DC

## 2013-11-09 NOTE — Progress Notes (Signed)
   Subjective:    Patient ID: [name], female    DOB: 1955/09/04, 58 y.o.   MRN: [id number]  HPI She vomited after eating oat meal this morning;it had no butter or milk . Since that time she has  felt hot, clammy, weak, nauseated, and bloated. Vomitus did not contain blood.  She's had extensive GI evaluations in the past. She is on probiotic.  She is on small portion dietary program; she does ingest salt. She does avoid beef, pork, or greasy foods. Lactose has caused bloating when ingested.  She's walking for 45 minutes 3 times a week and also engaged in water aerobics.  Past medical history of GERD; irritable bowel syndrome; cholecystectomy for gallbladder dysfunction;and hysterectomy .  Colonoscopy and upper endoscopy are up to date. Upper endo 11/24/12 revealed LA Class B esophagitis and nonerosive gastritis. There were multiple polyps ranging 2-4 mm in the gastric body gastric fundus.    . Review of Systems Unexplained weight loss, significant dyspepsia, dysphagia, melena, rectal bleeding, or persistently small caliber stools are denied.  She is had pain in the left ear for over a month since beginning of May. It previously was intermittent but constant over the last 2 weeks. It feels plugged and the pressure  has awakened her.       Objective:   Physical Exam   General appearance is one of good health and nourishment w/o distress.  Central weight excess present. Cerumen impaction noted on the left. S4 with slight slurring but no significant murmur present. Abdomen slightly protuberant.  Eyes: No conjunctival inflammation or scleral icterus is present. Oral exam: Dental hygiene is good; lips and gums are healthy appearing.There is no oropharyngeal erythema or exudate noted.  Heart:  Normal rate and regular rhythm. S1 and S2 normal without gallop, murmur, click, or rub.  Lungs:Chest clear to auscultation; no wheezes, rhonchi,rales ,or rubs present.No increased work of  breathing.  Abdomen: bowel sounds normal, soft and non-tender without masses, organomegaly or hernias noted.  No guarding or rebound . No tenderness over the flanks to percussion Musculoskeletal: Able to lie flat and sit up without help. Negative straight leg raising bilaterally. Gait normal Skin:Warm & dry.  Intact without suspicious lesions or rashes ; no jaundice or tenting Lymphatic: No lymphadenopathy is noted about the head, neck, axilla areas.                Assessment & Plan:  #1 nausea and vomiting in the context of prior history of erosive esophagitis and nonerosive gastritis  #2 cerumen impaction  See orders and recommendations

## 2013-11-09 NOTE — Progress Notes (Signed)
Pre visit review using our clinic review tool, if applicable. No additional management support is needed unless otherwise documented below in the visit note. 

## 2013-11-09 NOTE — Patient Instructions (Signed)
Reflux of gastric acid may be asymptomatic as this may occur mainly during sleep.The triggers for reflux  include stress; the "aspirin family" ; alcohol; peppermint; and caffeine (coffee, tea, cola, and chocolate). The aspirin family would include aspirin and the nonsteroidal agents such as ibuprofen &  Naproxen. Tylenol would not cause reflux. If having symptoms ; food & drink should be avoided for @ least 2 hours before going to bed.  Dissolve 1 tablet of Carafate  in 5 cc (1 teaspoon) of water. Take before meals and at bedtime for 10 days. GI referral if no better.  Please do not use Q-tips as we discussed. Should wax build up occur, please put 2-3 drops of mineral oil in the affected  ear at night to soften the wax .Cover the canal with a  cotton ball to prevent the oil from staining bed linens. In the morning fill the ear canal with hydrogen peroxide & lie in the opposite lateral decubitus position(on the side opposite the affected ear)  for 10-15 minutes. After allowing this period of time for the peroxide to dissolve the wax ;shower and use the thinnest washrag available to wick out the wax. If both ears are involved ; alternate this treatment from ear to ear each night until no wax is found on the washrag.

## 2013-11-10 DIAGNOSIS — E669 Obesity, unspecified: Secondary | ICD-10-CM | POA: Insufficient documentation

## 2013-11-15 ENCOUNTER — Other Ambulatory Visit: Payer: Self-pay | Admitting: Orthopedic Surgery

## 2013-11-15 DIAGNOSIS — M79621 Pain in right upper arm: Secondary | ICD-10-CM

## 2013-11-23 ENCOUNTER — Telehealth: Payer: Self-pay

## 2013-11-23 ENCOUNTER — Other Ambulatory Visit: Payer: Self-pay | Admitting: Family Medicine

## 2013-11-23 ENCOUNTER — Ambulatory Visit
Admission: RE | Admit: 2013-11-23 | Discharge: 2013-11-23 | Disposition: A | Discharge status: Home / Self Care | Payer: BC Managed Care – PPO | Source: Ambulatory Visit | Attending: Orthopedic Surgery | Admitting: Orthopedic Surgery

## 2013-11-23 DIAGNOSIS — E876 Hypokalemia: Secondary | ICD-10-CM

## 2013-11-23 DIAGNOSIS — M79621 Pain in right upper arm: Secondary | ICD-10-CM

## 2013-11-23 MED ORDER — GADOBENATE DIMEGLUMINE 529 MG/ML IV SOLN
20.0000 mL | Freq: Once | INTRAVENOUS | Status: AC | PRN
  Administered 2013-11-23: 20 mL via INTRAVENOUS

## 2013-11-23 MED ORDER — POTASSIUM CHLORIDE CRYS ER 20 MEQ PO TBCR: 20.0000 meq | EXTENDED_RELEASE_TABLET | Freq: Two times a day (BID) | ORAL | Status: DC

## 2013-11-23 NOTE — Telephone Encounter (Signed)
Dr. Carlean Jews, See other message, disregard this one.

## 2013-11-23 NOTE — Telephone Encounter (Signed)
Message copied by Constance Goltz on Tue Nov 23, 2013  1:55 PM ------      Message from: Robyn Haber      Created: Tue Nov 23, 2013  9:02 AM       Patient has a low potassium.  The thyroid looks okay.      I will order 2 weeks of potassium, after which patient should return for follow up      [place]      ----- Message -----         From: SYSTEM         Sent: 11/23/2013  12:01 AM           To: Robyn Haber, MD                   ------

## 2013-11-23 NOTE — Telephone Encounter (Signed)
Dr. Carlean Jews, this message was letting you know that you put in a future order for a sickle cell screen and she never had it done. Do you want me to call her regarding that?

## 2013-12-07 ENCOUNTER — Encounter: Payer: Self-pay | Admitting: *Deleted

## 2013-12-07 ENCOUNTER — Telehealth: Payer: Self-pay | Admitting: *Deleted

## 2013-12-07 DIAGNOSIS — IMO0001 Reserved for inherently not codable concepts without codable children: Secondary | ICD-10-CM

## 2013-12-07 DIAGNOSIS — E1165 Type 2 diabetes mellitus with hyperglycemia: Principal | ICD-10-CM

## 2013-12-07 NOTE — Telephone Encounter (Signed)
Lipid ordered Diabetic bundle

## 2014-01-13 ENCOUNTER — Telehealth: Payer: Self-pay | Admitting: Internal Medicine

## 2014-01-13 NOTE — Telephone Encounter (Signed)
Left patient VM to call back to schedule CPE.  Could not find date of last CPE in system.

## 2014-01-20 ENCOUNTER — Encounter: Payer: BC Managed Care – PPO | Admitting: Internal Medicine

## 2014-02-02 ENCOUNTER — Other Ambulatory Visit: Payer: Self-pay | Admitting: Internal Medicine

## 2014-02-02 LAB — HM DIABETES EYE EXAM

## 2014-02-08 ENCOUNTER — Other Ambulatory Visit (INDEPENDENT_AMBULATORY_CARE_PROVIDER_SITE_OTHER): Payer: BC Managed Care – PPO

## 2014-02-08 ENCOUNTER — Encounter: Payer: Self-pay | Admitting: Internal Medicine

## 2014-02-08 ENCOUNTER — Ambulatory Visit (INDEPENDENT_AMBULATORY_CARE_PROVIDER_SITE_OTHER): Payer: BC Managed Care – PPO | Admitting: Internal Medicine

## 2014-02-08 VITALS — BP 134/98 | HR 83 | Temp 98.2°F | Resp 16 | Ht 68.0 in | Wt 243.0 lb

## 2014-02-08 DIAGNOSIS — I10 Essential (primary) hypertension: Secondary | ICD-10-CM

## 2014-02-08 DIAGNOSIS — E1165 Type 2 diabetes mellitus with hyperglycemia: Secondary | ICD-10-CM

## 2014-02-08 DIAGNOSIS — E785 Hyperlipidemia, unspecified: Secondary | ICD-10-CM

## 2014-02-08 DIAGNOSIS — IMO0002 Reserved for concepts with insufficient information to code with codable children: Secondary | ICD-10-CM

## 2014-02-08 DIAGNOSIS — L729 Follicular cyst of the skin and subcutaneous tissue, unspecified: Secondary | ICD-10-CM

## 2014-02-08 DIAGNOSIS — IMO0001 Reserved for inherently not codable concepts without codable children: Secondary | ICD-10-CM

## 2014-02-08 DIAGNOSIS — Z0000 Encounter for general adult medical examination without abnormal findings: Secondary | ICD-10-CM

## 2014-02-08 DIAGNOSIS — L723 Sebaceous cyst: Secondary | ICD-10-CM

## 2014-02-08 DIAGNOSIS — K219 Gastro-esophageal reflux disease without esophagitis: Secondary | ICD-10-CM

## 2014-02-08 DIAGNOSIS — L089 Local infection of the skin and subcutaneous tissue, unspecified: Secondary | ICD-10-CM | POA: Insufficient documentation

## 2014-02-08 DIAGNOSIS — K589 Irritable bowel syndrome without diarrhea: Secondary | ICD-10-CM

## 2014-02-08 LAB — COMPREHENSIVE METABOLIC PANEL
ALBUMIN: 4.1 g/dL (ref 3.5–5.2)
ALK PHOS: 44 U/L (ref 39–117)
ALT: 34 U/L (ref 0–35)
AST: 17 U/L (ref 0–37)
BUN: 12 mg/dL (ref 6–23)
CO2: 28 mEq/L (ref 19–32)
Calcium: 9.6 mg/dL (ref 8.4–10.5)
Chloride: 100 mEq/L (ref 96–112)
Creatinine, Ser: 0.7 mg/dL (ref 0.4–1.2)
GFR: 104.87 mL/min (ref >60.00)
GLUCOSE: 93 mg/dL (ref 70–99)
POTASSIUM: 3.9 meq/L (ref 3.5–5.1)
Sodium: 137 mEq/L (ref 135–145)
Total Bilirubin: 0.7 mg/dL (ref 0.2–1.2)
Total Protein: 8 g/dL (ref 6.0–8.3)

## 2014-02-08 LAB — CBC WITH DIFFERENTIAL/PLATELET
BASOS PCT: 0.4 % (ref 0.0–3.0)
Basophils Absolute: 0 10*3/uL (ref 0.0–0.1)
EOS PCT: 1.7 % (ref 0.0–5.0)
Eosinophils Absolute: 0.1 10*3/uL (ref 0.0–0.7)
HCT: 40.7 % (ref 36.0–46.0)
Hemoglobin: 14.1 g/dL (ref 12.0–15.0)
Lymphocytes Relative: 53.6 % — ABNORMAL HIGH (ref 12.0–46.0)
Lymphs Abs: 2.8 10*3/uL (ref 0.7–4.0)
MCHC: 34.6 g/dL (ref 30.0–36.0)
MCV: 88.9 fl (ref 78.0–100.0)
MONOS PCT: 11 % (ref 3.0–12.0)
Monocytes Absolute: 0.6 10*3/uL (ref 0.1–1.0)
Neutro Abs: 1.7 10*3/uL (ref 1.4–7.7)
Neutrophils Relative %: 33.3 % — ABNORMAL LOW (ref 43.0–77.0)
Platelets: 228 10*3/uL (ref 150.0–400.0)
RBC: 4.57 Mil/uL (ref 3.87–5.11)
RDW: 14.7 % (ref 11.5–15.5)
WBC: 5.2 10*3/uL (ref 4.0–10.5)

## 2014-02-08 LAB — LIPID PANEL
CHOLESTEROL: 280 mg/dL — AB (ref 0–200)
HDL: 47.9 mg/dL (ref >39.00)
NonHDL: 232.1
Total CHOL/HDL Ratio: 6
Triglycerides: 237 mg/dL — ABNORMAL HIGH (ref 0.0–149.0)
VLDL: 47.4 mg/dL — ABNORMAL HIGH (ref 0.0–40.0)

## 2014-02-08 LAB — URINALYSIS, ROUTINE W REFLEX MICROSCOPIC
Bilirubin Urine: NEGATIVE
Ketones, ur: NEGATIVE
LEUKOCYTES UA: NEGATIVE
NITRITE: NEGATIVE
PH: 7 (ref 5.0–8.0)
Specific Gravity, Urine: 1.01 (ref 1.000–1.030)
TOTAL PROTEIN, URINE-UPE24: NEGATIVE
Urine Glucose: NEGATIVE
Urobilinogen, UA: 0.2 (ref 0.0–1.0)

## 2014-02-08 LAB — TSH: TSH: 0.58 u[IU]/mL (ref 0.35–4.50)

## 2014-02-08 LAB — LDL CHOLESTEROL, DIRECT: Direct LDL: 223 mg/dL

## 2014-02-08 LAB — HEMOGLOBIN A1C: Hgb A1c MFr Bld: 6.3 % (ref 4.6–6.5)

## 2014-02-08 MED ORDER — AMLODIPINE-OLMESARTAN 5-20 MG PO TABS: 1.0000 | ORAL_TABLET | Freq: Every day | ORAL | Status: DC

## 2014-02-08 MED ORDER — DOXYCYCLINE HYCLATE 100 MG PO CAPS: 100.0000 mg | ORAL_CAPSULE | Freq: Two times a day (BID) | ORAL | Status: AC

## 2014-02-08 NOTE — Patient Instructions (Signed)
Preventive Care for Adults A healthy lifestyle and preventive care can promote health and wellness. Preventive health guidelines for women include the following key practices.  A routine yearly physical is a good way to check with your health care provider about your health and preventive screening. It is a chance to share any concerns and updates on your health and to receive a thorough exam.  Visit your dentist for a routine exam and preventive care every 6 months. Brush your teeth twice a day and floss once a day. Good oral hygiene prevents tooth decay and gum disease.  The frequency of eye exams is based on your age, health, family medical history, use of contact lenses, and other factors. Follow your health care provider's recommendations for frequency of eye exams.  Eat a healthy diet. Foods like vegetables, fruits, whole grains, low-fat dairy products, and lean protein foods contain the nutrients you need without too many calories. Decrease your intake of foods high in solid fats, added sugars, and salt. Eat the right amount of calories for you.Get information about a proper diet from your health care provider, if necessary.  Regular physical exercise is one of the most important things you can do for your health. Most adults should get at least 150 minutes of moderate-intensity exercise (any activity that increases your heart rate and causes you to sweat) each week. In addition, most adults need muscle-strengthening exercises on 2 or more days a week.  Maintain a healthy weight. The body mass index (BMI) is a screening tool to identify possible weight problems. It provides an estimate of body fat based on height and weight. Your health care provider can find your BMI and can help you achieve or maintain a healthy weight.For adults 20 years and older:  A BMI below 18.5 is considered underweight.  A BMI of 18.5 to 24.9 is normal.  A BMI of 25 to 29.9 is considered overweight.  A BMI of  30 and above is considered obese.  Maintain normal blood lipids and cholesterol levels by exercising and minimizing your intake of saturated fat. Eat a balanced diet with plenty of fruit and vegetables. Blood tests for lipids and cholesterol should begin at age 76 and be repeated every 5 years. If your lipid or cholesterol levels are high, you are over 50, or you are at high risk for heart disease, you may need your cholesterol levels checked more frequently.Ongoing high lipid and cholesterol levels should be treated with medicines if diet and exercise are not working.  If you smoke, find out from your health care provider how to quit. If you do not use tobacco, do not start.  Lung cancer screening is recommended for adults aged 22-80 years who are at high risk for developing lung cancer because of a history of smoking. A yearly low-dose CT scan of the lungs is recommended for people who have at least a 30-pack-year history of smoking and are a current smoker or have quit within the past 15 years. A pack year of smoking is smoking an average of 1 pack of cigarettes a day for 1 year (for example: 1 pack a day for 30 years or 2 packs a day for 15 years). Yearly screening should continue until the smoker has stopped smoking for at least 15 years. Yearly screening should be stopped for people who develop a health problem that would prevent them from having lung cancer treatment.  If you are pregnant, do not drink alcohol. If you are breastfeeding,  be very cautious about drinking alcohol. If you are not pregnant and choose to drink alcohol, do not have more than 1 drink per day. One drink is considered to be 12 ounces (355 mL) of beer, 5 ounces (148 mL) of wine, or 1.5 ounces (44 mL) of liquor.  Avoid use of street drugs. Do not share needles with anyone. Ask for help if you need support or instructions about stopping the use of drugs.  High blood pressure causes heart disease and increases the risk of  stroke. Your blood pressure should be checked at least every 1 to 2 years. Ongoing high blood pressure should be treated with medicines if weight loss and exercise do not work.  If you are 75-52 years old, ask your health care provider if you should take aspirin to prevent strokes.  Diabetes screening involves taking a blood sample to check your fasting blood sugar level. This should be done once every 3 years, after age 15, if you are within normal weight and without risk factors for diabetes. Testing should be considered at a younger age or be carried out more frequently if you are overweight and have at least 1 risk factor for diabetes.  Breast cancer screening is essential preventive care for women. You should practice "breast self-awareness." This means understanding the normal appearance and feel of your breasts and may include breast self-examination. Any changes detected, no matter how small, should be reported to a health care provider. Women in their 58s and 30s should have a clinical breast exam (CBE) by a health care provider as part of a regular health exam every 1 to 3 years. After age 16, women should have a CBE every year. Starting at age 53, women should consider having a mammogram (breast X-ray test) every year. Women who have a family history of breast cancer should talk to their health care provider about genetic screening. Women at a high risk of breast cancer should talk to their health care providers about having an MRI and a mammogram every year.  Breast cancer gene (BRCA)-related cancer risk assessment is recommended for women who have family members with BRCA-related cancers. BRCA-related cancers include breast, ovarian, tubal, and peritoneal cancers. Having family members with these cancers may be associated with an increased risk for harmful changes (mutations) in the breast cancer genes BRCA1 and BRCA2. Results of the assessment will determine the need for genetic counseling and  BRCA1 and BRCA2 testing.  Routine pelvic exams to screen for cancer are no longer recommended for nonpregnant women who are considered low risk for cancer of the pelvic organs (ovaries, uterus, and vagina) and who do not have symptoms. Ask your health care provider if a screening pelvic exam is right for you.  If you have had past treatment for cervical cancer or a condition that could lead to cancer, you need Pap tests and screening for cancer for at least 20 years after your treatment. If Pap tests have been discontinued, your risk factors (such as having a new sexual partner) need to be reassessed to determine if screening should be resumed. Some women have medical problems that increase the chance of getting cervical cancer. In these cases, your health care provider may recommend more frequent screening and Pap tests.  The HPV test is an additional test that may be used for cervical cancer screening. The HPV test looks for the virus that can cause the cell changes on the cervix. The cells collected during the Pap test can be  tested for HPV. The HPV test could be used to screen women aged 30 years and older, and should be used in women of any age who have unclear Pap test results. After the age of 30, women should have HPV testing at the same frequency as a Pap test.  Colorectal cancer can be detected and often prevented. Most routine colorectal cancer screening begins at the age of 50 years and continues through age 75 years. However, your health care provider may recommend screening at an earlier age if you have risk factors for colon cancer. On a yearly basis, your health care provider may provide home test kits to check for hidden blood in the stool. Use of a small camera at the end of a tube, to directly examine the colon (sigmoidoscopy or colonoscopy), can detect the earliest forms of colorectal cancer. Talk to your health care provider about this at age 50, when routine screening begins. Direct  exam of the colon should be repeated every 5-10 years through age 75 years, unless early forms of pre-cancerous polyps or small growths are found.  People who are at an increased risk for hepatitis B should be screened for this virus. You are considered at high risk for hepatitis B if:  You were born in a country where hepatitis B occurs often. Talk with your health care provider about which countries are considered high risk.  Your parents were born in a high-risk country and you have not received a shot to protect against hepatitis B (hepatitis B vaccine).  You have HIV or AIDS.  You use needles to inject street drugs.  You live with, or have sex with, someone who has hepatitis B.  You get hemodialysis treatment.  You take certain medicines for conditions like cancer, organ transplantation, and autoimmune conditions.  Hepatitis C blood testing is recommended for all people born from 1945 through 1965 and any individual with known risks for hepatitis C.  Practice safe sex. Use condoms and avoid high-risk sexual practices to reduce the spread of sexually transmitted infections (STIs). STIs include gonorrhea, chlamydia, syphilis, trichomonas, herpes, HPV, and human immunodeficiency virus (HIV). Herpes, HIV, and HPV are viral illnesses that have no cure. They can result in disability, cancer, and death.  You should be screened for sexually transmitted illnesses (STIs) including gonorrhea and chlamydia if:  You are sexually active and are younger than 24 years.  You are older than 24 years and your health care provider tells you that you are at risk for this type of infection.  Your sexual activity has changed since you were last screened and you are at an increased risk for chlamydia or gonorrhea. Ask your health care provider if you are at risk.  If you are at risk of being infected with HIV, it is recommended that you take a prescription medicine daily to prevent HIV infection. This is  called preexposure prophylaxis (PrEP). You are considered at risk if:  You are a heterosexual woman, are sexually active, and are at increased risk for HIV infection.  You take drugs by injection.  You are sexually active with a partner who has HIV.  Talk with your health care provider about whether you are at high risk of being infected with HIV. If you choose to begin PrEP, you should first be tested for HIV. You should then be tested every 3 months for as long as you are taking PrEP.  Osteoporosis is a disease in which the bones lose minerals and strength   with aging. This can result in serious bone fractures or breaks. The risk of osteoporosis can be identified using a bone density scan. Women ages 65 years and over and women at risk for fractures or osteoporosis should discuss screening with their health care providers. Ask your health care provider whether you should take a calcium supplement or vitamin D to reduce the rate of osteoporosis.  Menopause can be associated with physical symptoms and risks. Hormone replacement therapy is available to decrease symptoms and risks. You should talk to your health care provider about whether hormone replacement therapy is right for you.  Use sunscreen. Apply sunscreen liberally and repeatedly throughout the day. You should seek shade when your shadow is shorter than you. Protect yourself by wearing long sleeves, pants, a wide-brimmed hat, and sunglasses year round, whenever you are outdoors.  Once a month, do a whole body skin exam, using a mirror to look at the skin on your back. Tell your health care provider of new moles, moles that have irregular borders, moles that are larger than a pencil eraser, or moles that have changed in shape or color.  Stay current with required vaccines (immunizations).  Influenza vaccine. All adults should be immunized every year.  Tetanus, diphtheria, and acellular pertussis (Td, Tdap) vaccine. Pregnant women should  receive 1 dose of Tdap vaccine during each pregnancy. The dose should be obtained regardless of the length of time since the last dose. Immunization is preferred during the 27th-36th week of gestation. An adult who has not previously received Tdap or who does not know her vaccine status should receive 1 dose of Tdap. This initial dose should be followed by tetanus and diphtheria toxoids (Td) booster doses every 10 years. Adults with an unknown or incomplete history of completing a 3-dose immunization series with Td-containing vaccines should begin or complete a primary immunization series including a Tdap dose. Adults should receive a Td booster every 10 years.  Varicella vaccine. An adult without evidence of immunity to varicella should receive 2 doses or a second dose if she has previously received 1 dose. Pregnant females who do not have evidence of immunity should receive the first dose after pregnancy. This first dose should be obtained before leaving the health care facility. The second dose should be obtained 4-8 weeks after the first dose.  Human papillomavirus (HPV) vaccine. Females aged 13-26 years who have not received the vaccine previously should obtain the 3-dose series. The vaccine is not recommended for use in pregnant females. However, pregnancy testing is not needed before receiving a dose. If a female is found to be pregnant after receiving a dose, no treatment is needed. In that case, the remaining doses should be delayed until after the pregnancy. Immunization is recommended for any person with an immunocompromised condition through the age of 26 years if she did not get any or all doses earlier. During the 3-dose series, the second dose should be obtained 4-8 weeks after the first dose. The third dose should be obtained 24 weeks after the first dose and 16 weeks after the second dose.  Zoster vaccine. One dose is recommended for adults aged 60 years or older unless certain conditions are  present.  Measles, mumps, and rubella (MMR) vaccine. Adults born before 1957 generally are considered immune to measles and mumps. Adults born in 1957 or later should have 1 or more doses of MMR vaccine unless there is a contraindication to the vaccine or there is laboratory evidence of immunity to   each of the three diseases. A routine second dose of MMR vaccine should be obtained at least 28 days after the first dose for students attending postsecondary schools, health care workers, or international travelers. People who received inactivated measles vaccine or an unknown type of measles vaccine during 1963-1967 should receive 2 doses of MMR vaccine. People who received inactivated mumps vaccine or an unknown type of mumps vaccine before 1979 and are at high risk for mumps infection should consider immunization with 2 doses of MMR vaccine. For females of childbearing age, rubella immunity should be determined. If there is no evidence of immunity, females who are not pregnant should be vaccinated. If there is no evidence of immunity, females who are pregnant should delay immunization until after pregnancy. Unvaccinated health care workers born before 1957 who lack laboratory evidence of measles, mumps, or rubella immunity or laboratory confirmation of disease should consider measles and mumps immunization with 2 doses of MMR vaccine or rubella immunization with 1 dose of MMR vaccine.  Pneumococcal 13-valent conjugate (PCV13) vaccine. When indicated, a person who is uncertain of her immunization history and has no record of immunization should receive the PCV13 vaccine. An adult aged 19 years or older who has certain medical conditions and has not been previously immunized should receive 1 dose of PCV13 vaccine. This PCV13 should be followed with a dose of pneumococcal polysaccharide (PPSV23) vaccine. The PPSV23 vaccine dose should be obtained at least 8 weeks after the dose of PCV13 vaccine. An adult aged 19  years or older who has certain medical conditions and previously received 1 or more doses of PPSV23 vaccine should receive 1 dose of PCV13. The PCV13 vaccine dose should be obtained 1 or more years after the last PPSV23 vaccine dose.  Pneumococcal polysaccharide (PPSV23) vaccine. When PCV13 is also indicated, PCV13 should be obtained first. All adults aged 65 years and older should be immunized. An adult younger than age 65 years who has certain medical conditions should be immunized. Any person who resides in a nursing home or long-term care facility should be immunized. An adult smoker should be immunized. People with an immunocompromised condition and certain other conditions should receive both PCV13 and PPSV23 vaccines. People with human immunodeficiency virus (HIV) infection should be immunized as soon as possible after diagnosis. Immunization during chemotherapy or radiation therapy should be avoided. Routine use of PPSV23 vaccine is not recommended for American Indians, [place] Natives, or people younger than 65 years unless there are medical conditions that require PPSV23 vaccine. When indicated, people who have unknown immunization and have no record of immunization should receive PPSV23 vaccine. One-time revaccination 5 years after the first dose of PPSV23 is recommended for people aged 19-64 years who have chronic kidney failure, nephrotic syndrome, asplenia, or immunocompromised conditions. People who received 1-2 doses of PPSV23 before age 65 years should receive another dose of PPSV23 vaccine at age 65 years or later if at least 5 years have passed since the previous dose. Doses of PPSV23 are not needed for people immunized with PPSV23 at or after age 65 years.  Meningococcal vaccine. Adults with asplenia or persistent complement component deficiencies should receive 2 doses of quadrivalent meningococcal conjugate (MenACWY-D) vaccine. The doses should be obtained at least 2 months apart.  Microbiologists working with certain meningococcal bacteria, military recruits, people at risk during an outbreak, and people who travel to or live in countries with a high rate of meningitis should be immunized. A first-year college student up through age   21 years who is living in a residence hall should receive a dose if she did not receive a dose on or after her 16th birthday. Adults who have certain high-risk conditions should receive one or more doses of vaccine.  Hepatitis A vaccine. Adults who wish to be protected from this disease, have certain high-risk conditions, work with hepatitis A-infected animals, work in hepatitis A research labs, or travel to or work in countries with a high rate of hepatitis A should be immunized. Adults who were previously unvaccinated and who anticipate close contact with an international adoptee during the first 60 days after arrival in the [place] from a country with a high rate of hepatitis A should be immunized.  Hepatitis B vaccine. Adults who wish to be protected from this disease, have certain high-risk conditions, may be exposed to blood or other infectious body fluids, are household contacts or sex partners of hepatitis B positive people, are clients or workers in certain care facilities, or travel to or work in countries with a high rate of hepatitis B should be immunized.  Haemophilus influenzae type b (Hib) vaccine. A previously unvaccinated person with asplenia or sickle cell disease or having a scheduled splenectomy should receive 1 dose of Hib vaccine. Regardless of previous immunization, a recipient of a hematopoietic stem cell transplant should receive a 3-dose series 6-12 months after her successful transplant. Hib vaccine is not recommended for adults with HIV infection. Preventive Services / Frequency Ages 64 to 68 years  Blood pressure check.** / Every 1 to 2 years.  Lipid and cholesterol check.** / Every 5 years beginning at age  22.  Clinical breast exam.** / Every 3 years for women in their 88s and 53s.  BRCA-related cancer risk assessment.** / For women who have family members with a BRCA-related cancer (breast, ovarian, tubal, or peritoneal cancers).  Pap test.** / Every 2 years from ages 90 through 51. Every 3 years starting at age 21 through age 56 or 3 with a history of 3 consecutive normal Pap tests.  HPV screening.** / Every 3 years from ages 24 through ages 1 to 46 with a history of 3 consecutive normal Pap tests.  Hepatitis C blood test.** / For any individual with known risks for hepatitis C.  Skin self-exam. / Monthly.  Influenza vaccine. / Every year.  Tetanus, diphtheria, and acellular pertussis (Tdap, Td) vaccine.** / Consult your health care provider. Pregnant women should receive 1 dose of Tdap vaccine during each pregnancy. 1 dose of Td every 10 years.  Varicella vaccine.** / Consult your health care provider. Pregnant females who do not have evidence of immunity should receive the first dose after pregnancy.  HPV vaccine. / 3 doses over 6 months, if 72 and younger. The vaccine is not recommended for use in pregnant females. However, pregnancy testing is not needed before receiving a dose.  Measles, mumps, rubella (MMR) vaccine.** / You need at least 1 dose of MMR if you were born in 1957 or later. You may also need a 2nd dose. For females of childbearing age, rubella immunity should be determined. If there is no evidence of immunity, females who are not pregnant should be vaccinated. If there is no evidence of immunity, females who are pregnant should delay immunization until after pregnancy.  Pneumococcal 13-valent conjugate (PCV13) vaccine.** / Consult your health care provider.  Pneumococcal polysaccharide (PPSV23) vaccine.** / 1 to 2 doses if you smoke cigarettes or if you have certain conditions.  Meningococcal vaccine.** /  1 dose if you are age 19 to 21 years and a first-year college  student living in a residence hall, or have one of several medical conditions, you need to get vaccinated against meningococcal disease. You may also need additional booster doses.  Hepatitis A vaccine.** / Consult your health care provider.  Hepatitis B vaccine.** / Consult your health care provider.  Haemophilus influenzae type b (Hib) vaccine.** / Consult your health care provider. Ages 40 to 64 years  Blood pressure check.** / Every 1 to 2 years.  Lipid and cholesterol check.** / Every 5 years beginning at age 20 years.  Lung cancer screening. / Every year if you are aged 55-80 years and have a 30-pack-year history of smoking and currently smoke or have quit within the past 15 years. Yearly screening is stopped once you have quit smoking for at least 15 years or develop a health problem that would prevent you from having lung cancer treatment.  Clinical breast exam.** / Every year after age 40 years.  BRCA-related cancer risk assessment.** / For women who have family members with a BRCA-related cancer (breast, ovarian, tubal, or peritoneal cancers).  Mammogram.** / Every year beginning at age 40 years and continuing for as long as you are in good health. Consult with your health care provider.  Pap test.** / Every 3 years starting at age 30 years through age 65 or 70 years with a history of 3 consecutive normal Pap tests.  HPV screening.** / Every 3 years from ages 30 years through ages 65 to 70 years with a history of 3 consecutive normal Pap tests.  Fecal occult blood test (FOBT) of stool. / Every year beginning at age 50 years and continuing until age 75 years. You may not need to do this test if you get a colonoscopy every 10 years.  Flexible sigmoidoscopy or colonoscopy.** / Every 5 years for a flexible sigmoidoscopy or every 10 years for a colonoscopy beginning at age 50 years and continuing until age 75 years.  Hepatitis C blood test.** / For all people born from 1945 through  1965 and any individual with known risks for hepatitis C.  Skin self-exam. / Monthly.  Influenza vaccine. / Every year.  Tetanus, diphtheria, and acellular pertussis (Tdap/Td) vaccine.** / Consult your health care provider. Pregnant women should receive 1 dose of Tdap vaccine during each pregnancy. 1 dose of Td every 10 years.  Varicella vaccine.** / Consult your health care provider. Pregnant females who do not have evidence of immunity should receive the first dose after pregnancy.  Zoster vaccine.** / 1 dose for adults aged 60 years or older.  Measles, mumps, rubella (MMR) vaccine.** / You need at least 1 dose of MMR if you were born in 1957 or later. You may also need a 2nd dose. For females of childbearing age, rubella immunity should be determined. If there is no evidence of immunity, females who are not pregnant should be vaccinated. If there is no evidence of immunity, females who are pregnant should delay immunization until after pregnancy.  Pneumococcal 13-valent conjugate (PCV13) vaccine.** / Consult your health care provider.  Pneumococcal polysaccharide (PPSV23) vaccine.** / 1 to 2 doses if you smoke cigarettes or if you have certain conditions.  Meningococcal vaccine.** / Consult your health care provider.  Hepatitis A vaccine.** / Consult your health care provider.  Hepatitis B vaccine.** / Consult your health care provider.  Haemophilus influenzae type b (Hib) vaccine.** / Consult your health care provider. Ages 65   years and over  Blood pressure check.** / Every 1 to 2 years.  Lipid and cholesterol check.** / Every 5 years beginning at age 43 years.  Lung cancer screening. / Every year if you are aged 35-80 years and have a 30-pack-year history of smoking and currently smoke or have quit within the past 15 years. Yearly screening is stopped once you have quit smoking for at least 15 years or develop a health problem that would prevent you from having lung cancer  treatment.  Clinical breast exam.** / Every year after age 46 years.  BRCA-related cancer risk assessment.** / For women who have family members with a BRCA-related cancer (breast, ovarian, tubal, or peritoneal cancers).  Mammogram.** / Every year beginning at age 11 years and continuing for as long as you are in good health. Consult with your health care provider.  Pap test.** / Every 3 years starting at age 16 years through age 46 or 64 years with 3 consecutive normal Pap tests. Testing can be stopped between 65 and 70 years with 3 consecutive normal Pap tests and no abnormal Pap or HPV tests in the past 10 years.  HPV screening.** / Every 3 years from ages 50 years through ages 87 or 38 years with a history of 3 consecutive normal Pap tests. Testing can be stopped between 65 and 70 years with 3 consecutive normal Pap tests and no abnormal Pap or HPV tests in the past 10 years.  Fecal occult blood test (FOBT) of stool. / Every year beginning at age 26 years and continuing until age 76 years. You may not need to do this test if you get a colonoscopy every 10 years.  Flexible sigmoidoscopy or colonoscopy.** / Every 5 years for a flexible sigmoidoscopy or every 10 years for a colonoscopy beginning at age 64 years and continuing until age 58 years.  Hepatitis C blood test.** / For all people born from [birth year] through 1966 and any individual with known risks for hepatitis C.  Osteoporosis screening.** / A one-time screening for women ages 25 years and over and women at risk for fractures or osteoporosis.  Skin self-exam. / Monthly.  Influenza vaccine. / Every year.  Tetanus, diphtheria, and acellular pertussis (Tdap/Td) vaccine.** / 1 dose of Td every 10 years.  Varicella vaccine.** / Consult your health care provider.  Zoster vaccine.** / 1 dose for adults aged 62 years or older.  Pneumococcal 13-valent conjugate (PCV13) vaccine.** / Consult your health care provider.  Pneumococcal  polysaccharide (PPSV23) vaccine.** / 1 dose for all adults aged 45 years and older.  Meningococcal vaccine.** / Consult your health care provider.  Hepatitis A vaccine.** / Consult your health care provider.  Hepatitis B vaccine.** / Consult your health care provider.  Haemophilus influenzae type b (Hib) vaccine.** / Consult your health care provider. ** Family history and personal history of risk and conditions may change your health care provider's recommendations. Document Released: 07/16/2001 Document Revised: 10/04/2013 Document Reviewed: 10/15/2010 [facility] Patient Information 2015 [place], [place]. This information is not intended to replace advice given to you by your health care provider. Make sure you discuss any questions you have with your health care provider. Hypertension Hypertension, commonly called high blood pressure, is when the force of blood pumping through your arteries is too strong. Your arteries are the blood vessels that carry blood from your heart throughout your body. A blood pressure reading consists of a higher number over a lower number, such as 110/72. The higher number (  systolic) is the pressure inside your arteries when your heart pumps. The lower number (diastolic) is the pressure inside your arteries when your heart relaxes. Ideally you want your blood pressure below 120/80. Hypertension forces your heart to work harder to pump blood. Your arteries may become narrow or stiff. Having hypertension puts you at risk for heart disease, stroke, and other problems.  RISK FACTORS Some risk factors for high blood pressure are controllable. Others are not.  Risk factors you cannot control include:   Race. You may be at higher risk if you are African American.  Age. Risk increases with age.  Gender. Men are at higher risk than women before age 35 years. After age 25, women are at higher risk than men. Risk factors you can control include:  Not getting enough exercise  or physical activity.  Being overweight.  Getting too much fat, sugar, calories, or salt in your diet.  Drinking too much alcohol. SIGNS AND SYMPTOMS Hypertension does not usually cause signs or symptoms. Extremely high blood pressure (hypertensive crisis) may cause headache, anxiety, shortness of breath, and nosebleed. DIAGNOSIS  To check if you have hypertension, your health care provider will measure your blood pressure while you are seated, with your arm held at the level of your heart. It should be measured at least twice using the same arm. Certain conditions can cause a difference in blood pressure between your right and left arms. A blood pressure reading that is higher than normal on one occasion does not mean that you need treatment. If one blood pressure reading is high, ask your health care provider about having it checked again. TREATMENT  Treating high blood pressure includes making lifestyle changes and possibly taking medicine. Living a healthy lifestyle can help lower high blood pressure. You may need to change some of your habits. Lifestyle changes may include:  Following the DASH diet. This diet is high in fruits, vegetables, and whole grains. It is low in salt, red meat, and added sugars.  Getting at least 2 hours of brisk physical activity every week.  Losing weight if necessary.  Not smoking.  Limiting alcoholic beverages.  Learning ways to reduce stress. If lifestyle changes are not enough to get your blood pressure under control, your health care provider may prescribe medicine. You may need to take more than one. Work closely with your health care provider to understand the risks and benefits. HOME CARE INSTRUCTIONS  Have your blood pressure rechecked as directed by your health care provider.   Take medicines only as directed by your health care provider. Follow the directions carefully. Blood pressure medicines must be taken as prescribed. The medicine does  not work as well when you skip doses. Skipping doses also puts you at risk for problems.   Do not smoke.   Monitor your blood pressure at home as directed by your health care provider. SEEK MEDICAL CARE IF:   You think you are having a reaction to medicines taken.  You have recurrent headaches or feel dizzy.  You have swelling in your ankles.  You have trouble with your vision. SEEK IMMEDIATE MEDICAL CARE IF:  You develop a severe headache or confusion.  You have unusual weakness, numbness, or feel faint.  You have severe chest or abdominal pain.  You vomit repeatedly.  You have trouble breathing. MAKE SURE YOU:   Understand these instructions.  Will watch your condition.  Will get help right away if you are not doing well or get  worse. Document Released: 05/20/2005 Document Revised: 10/04/2013 Document Reviewed: 03/12/2013 [facility] Patient Information 2015 [place], [place]. This information is not intended to replace advice given to you by your health care provider. Make sure you discuss any questions you have with your health care provider.

## 2014-02-08 NOTE — Progress Notes (Signed)
Pre visit review using our clinic review tool, if applicable. No additional management support is needed unless otherwise documented below in the visit note. 

## 2014-02-08 NOTE — Assessment & Plan Note (Signed)
Exam done Vaccines were addressed Labs ordered Pt ed material was given

## 2014-02-08 NOTE — Progress Notes (Signed)
Subjective:    Patient ID: [name], female    DOB: [birth year], 58 y.o.   MRN: [id number]  Hypertension This is a chronic problem. The current episode started more than 1 year ago. The problem has been gradually worsening since onset. The problem is uncontrolled. Pertinent negatives include no anxiety, blurred vision, chest pain, headaches, malaise/fatigue, neck pain, orthopnea, palpitations, peripheral edema, PND, shortness of breath or sweats. Risk factors for coronary artery disease include obesity. Past treatments include diuretics. The current treatment provides mild improvement. Compliance problems include diet and exercise.       Review of Systems  Constitutional: Negative.  Negative for fever, chills, malaise/fatigue, diaphoresis, appetite change and fatigue.  HENT: Positive for ear pain. Negative for congestion, dental problem, drooling, ear discharge, facial swelling, hearing loss, mouth sores, nosebleeds, postnasal drip, rhinorrhea, sinus pressure, sneezing, sore throat, tinnitus and trouble swallowing.        She has pain above her left ear, there is a cyst there that occasionally drains "smelly" fluid  Eyes: Negative.  Negative for blurred vision.  Respiratory: Negative.  Negative for apnea, cough, choking, chest tightness, shortness of breath, wheezing and stridor.   Cardiovascular: Negative.  Negative for chest pain, palpitations, orthopnea, leg swelling and PND.  Gastrointestinal: Negative.  Negative for nausea, vomiting, abdominal pain, diarrhea, constipation and blood in stool.  Endocrine: Negative.   Genitourinary: Negative.   Musculoskeletal: Positive for myalgias. Negative for arthralgias, back pain, gait problem, joint swelling, neck pain and neck stiffness.  Skin: Negative.  Negative for rash.  Allergic/Immunologic: Negative.   Neurological: Negative.  Negative for dizziness, tremors, syncope, speech difficulty, weakness, light-headedness, numbness and  headaches.  Hematological: Negative.  Negative for adenopathy. Does not bruise/bleed easily.  Psychiatric/Behavioral: Negative.        Objective:   Physical Exam  Vitals reviewed. Constitutional: She is oriented to person, place, and time. She appears well-developed and well-nourished. No distress.  HENT:  Head: Normocephalic and atraumatic.  Right Ear: Hearing, tympanic membrane, external ear and ear canal normal.  Left Ear: Hearing, tympanic membrane, external ear and ear canal normal.  Ears:  Mouth/Throat: Oropharynx is clear and moist. No oropharyngeal exudate.  Eyes: Conjunctivae are normal. Right eye exhibits no discharge. Left eye exhibits no discharge. No scleral icterus.  Neck: Normal range of motion. Neck supple. No JVD present. No tracheal deviation present. No thyromegaly present.  Cardiovascular: Normal rate, regular rhythm, normal heart sounds and intact distal pulses.  Exam reveals no gallop and no friction rub.   No murmur heard. Pulmonary/Chest: Effort normal and breath sounds normal. No stridor. No respiratory distress. She has no wheezes. She has no rales. She exhibits no tenderness.  Abdominal: Soft. Bowel sounds are normal. She exhibits no distension and no mass. There is no tenderness. There is no rebound and no guarding.  Musculoskeletal: Normal range of motion. She exhibits no edema and no tenderness.  Lymphadenopathy:    She has no cervical adenopathy.  Neurological: She is oriented to person, place, and time.  Skin: Skin is warm and dry. No rash noted. She is not diaphoretic. No erythema. No pallor.  Psychiatric: She has a normal mood and affect. Her behavior is normal. Judgment and thought content normal.     Lab Results  Component Value Date   WBC 5.7 06/11/2013   HGB 14.3 06/11/2013   HCT 40.5 06/11/2013   PLT 241.0 06/11/2013   GLUCOSE 96 06/11/2013   CHOL 254* 01/07/2013   TRIG  171* 01/07/2013   HDL 40 01/07/2013   LDLCALC 172 01/07/2013   ALT 29 06/11/2013    AST 14 06/11/2013   NA 139 06/11/2013   K 3.8 06/11/2013   CL 102 06/11/2013   CREATININE 0.8 06/11/2013   BUN 10 06/11/2013   CO2 29 06/11/2013   TSH 0.46 06/11/2013   HGBA1C 6.0 06/11/2013   MICROALBUR 1.6 08/09/2008       Assessment & Plan:

## 2014-02-08 NOTE — Assessment & Plan Note (Signed)
Her BP is not well controlled She will improve on her lifestyle modifications I have asked her to add Azor to the HCTZ

## 2014-02-08 NOTE — Assessment & Plan Note (Signed)
I will treat this with doxycycline

## 2014-02-08 NOTE — Assessment & Plan Note (Signed)
She has decided not to take a statin I will recheck her FLP today

## 2014-02-08 NOTE — Assessment & Plan Note (Signed)
I will recheck her A1C and will treat if needed 

## 2014-02-09 ENCOUNTER — Encounter: Payer: Self-pay | Admitting: Internal Medicine

## 2014-02-15 ENCOUNTER — Telehealth: Payer: Self-pay | Admitting: *Deleted

## 2014-02-15 DIAGNOSIS — H9209 Otalgia, unspecified ear: Secondary | ICD-10-CM | POA: Insufficient documentation

## 2014-02-15 DIAGNOSIS — H9202 Otalgia, left ear: Secondary | ICD-10-CM

## 2014-02-15 NOTE — Telephone Encounter (Signed)
Notified pt with md response.../lmb 

## 2014-02-15 NOTE — Telephone Encounter (Signed)
Left msg on triage stating her ear is not any better, still having ongoing pain would like an referral to see ENT...Johny Chess

## 2014-02-15 NOTE — Telephone Encounter (Signed)
done

## 2014-04-11 ENCOUNTER — Telehealth: Payer: Self-pay | Admitting: Internal Medicine

## 2014-04-11 DIAGNOSIS — I10 Essential (primary) hypertension: Secondary | ICD-10-CM

## 2014-04-11 MED ORDER — AMLODIPINE-OLMESARTAN 5-20 MG PO TABS: 1.0000 | ORAL_TABLET | Freq: Every day | ORAL | Status: DC

## 2014-04-11 NOTE — Telephone Encounter (Signed)
Azor refilled Stay on HCTZ as well

## 2014-04-11 NOTE — Telephone Encounter (Signed)
Pt out of samples, Azor. Pt needs prescription or more samples. Also is pt suppose to continue with Hydrochlorothiazide?

## 2014-08-02 ENCOUNTER — Encounter: Payer: Self-pay | Admitting: Internal Medicine

## 2014-08-02 ENCOUNTER — Ambulatory Visit (INDEPENDENT_AMBULATORY_CARE_PROVIDER_SITE_OTHER): Payer: BLUE CROSS/BLUE SHIELD | Admitting: Internal Medicine

## 2014-08-02 ENCOUNTER — Other Ambulatory Visit (INDEPENDENT_AMBULATORY_CARE_PROVIDER_SITE_OTHER): Payer: BLUE CROSS/BLUE SHIELD

## 2014-08-02 VITALS — BP 130/90 | HR 71 | Temp 97.9°F | Resp 16 | Ht 68.0 in | Wt 236.0 lb

## 2014-08-02 DIAGNOSIS — M609 Myositis, unspecified: Secondary | ICD-10-CM

## 2014-08-02 DIAGNOSIS — R1084 Generalized abdominal pain: Secondary | ICD-10-CM

## 2014-08-02 DIAGNOSIS — IMO0001 Reserved for inherently not codable concepts without codable children: Secondary | ICD-10-CM

## 2014-08-02 DIAGNOSIS — R5383 Other fatigue: Secondary | ICD-10-CM

## 2014-08-02 DIAGNOSIS — R1314 Dysphagia, pharyngoesophageal phase: Secondary | ICD-10-CM

## 2014-08-02 DIAGNOSIS — I10 Essential (primary) hypertension: Secondary | ICD-10-CM

## 2014-08-02 DIAGNOSIS — M791 Myalgia: Secondary | ICD-10-CM

## 2014-08-02 DIAGNOSIS — E119 Type 2 diabetes mellitus without complications: Secondary | ICD-10-CM

## 2014-08-02 LAB — AMYLASE: Amylase: 49 U/L (ref 27–131)

## 2014-08-02 LAB — HEPATIC FUNCTION PANEL
ALBUMIN: 4.5 g/dL (ref 3.5–5.2)
ALK PHOS: 52 U/L (ref 39–117)
ALT: 26 U/L (ref 0–35)
AST: 13 U/L (ref 0–37)
BILIRUBIN TOTAL: 0.5 mg/dL (ref 0.2–1.2)
Bilirubin, Direct: 0.1 mg/dL (ref 0.0–0.3)
Total Protein: 8.1 g/dL (ref 6.0–8.3)

## 2014-08-02 LAB — CBC WITH DIFFERENTIAL/PLATELET
Basophils Absolute: 0 10*3/uL (ref 0.0–0.1)
Basophils Relative: 0.5 % (ref 0.0–3.0)
Eosinophils Absolute: 0.1 10*3/uL (ref 0.0–0.7)
Eosinophils Relative: 1.6 % (ref 0.0–5.0)
HCT: 40.6 % (ref 36.0–46.0)
Hemoglobin: 14.3 g/dL (ref 12.0–15.0)
LYMPHS ABS: 2.4 10*3/uL (ref 0.7–4.0)
Lymphocytes Relative: 54.2 % — ABNORMAL HIGH (ref 12.0–46.0)
MCHC: 35.1 g/dL (ref 30.0–36.0)
MCV: 86.2 fl (ref 78.0–100.0)
MONO ABS: 0.5 10*3/uL (ref 0.1–1.0)
MONOS PCT: 12 % (ref 3.0–12.0)
NEUTROS PCT: 31.7 % — AB (ref 43.0–77.0)
Neutro Abs: 1.4 10*3/uL (ref 1.4–7.7)
PLATELETS: 231 10*3/uL (ref 150.0–400.0)
RBC: 4.72 Mil/uL (ref 3.87–5.11)
RDW: 14.2 % (ref 11.5–15.5)
WBC: 4.4 10*3/uL (ref 4.0–10.5)

## 2014-08-02 LAB — BASIC METABOLIC PANEL
BUN: 12 mg/dL (ref 6–23)
CHLORIDE: 102 meq/L (ref 96–112)
CO2: 29 mEq/L (ref 19–32)
CREATININE: 0.85 mg/dL (ref 0.40–1.20)
Calcium: 9.9 mg/dL (ref 8.4–10.5)
GFR: 87.83 mL/min (ref >60.00)
GLUCOSE: 115 mg/dL — AB (ref 70–99)
POTASSIUM: 4.1 meq/L (ref 3.5–5.1)
Sodium: 140 mEq/L (ref 135–145)

## 2014-08-02 LAB — LIPASE: Lipase: 27 U/L (ref 11.0–59.0)

## 2014-08-02 LAB — CK: Total CK: 141 U/L (ref 7–177)

## 2014-08-02 LAB — HEMOGLOBIN A1C: Hgb A1c MFr Bld: 6.2 % (ref 4.6–6.5)

## 2014-08-02 LAB — SEDIMENTATION RATE: SED RATE: 17 mm/h (ref 0–22)

## 2014-08-02 LAB — TSH: TSH: 0.63 u[IU]/mL (ref 0.35–4.50)

## 2014-08-02 MED ORDER — OMEPRAZOLE 20 MG PO CPDR: 20.0000 mg | DELAYED_RELEASE_CAPSULE | Freq: Two times a day (BID) | ORAL | Status: DC

## 2014-08-02 NOTE — Patient Instructions (Signed)
  Your next office appointment will be determined based upon review of your pending labs  Those instructions will be transmitted to you by mail. Critical values will be called. Followup as needed for any active or acute issue. Please report any significant change in your symptoms.  Reflux of gastric acid may be asymptomatic as this may occur mainly during sleep.The triggers for reflux  include stress; the "aspirin family" ; alcohol; peppermint; and caffeine (coffee, tea, cola, and chocolate). The aspirin family would include aspirin and the nonsteroidal agents such as ibuprofen &  Naproxen. Tylenol would not cause reflux. If having symptoms ; food & drink should be avoided for @ least 2 hours before going to bed.  Take the protein pump inhibitor Omeprazole  30 minutes before breakfast and 30 minutes before the evening meal for 8 weeks then go back to once a day  30 minutes before breakfast.

## 2014-08-02 NOTE — Progress Notes (Signed)
   Subjective:    Patient ID: [name], female    DOB: Apr 16, 1956, 59 y.o.   MRN: [id number]  HPI She describes a burning discomfort throughout the abdomen which began several weeks ago. It is described as a level VIII out of 10. It is nonradiating. It did affect her sleep; her gynecologist prescribed Ambien. She been taking ibuprofen 600 mg bid for several weeks  and Tylenol which were of benefit for short period time but not now. Pain is worse postprandially.   She's been on ranitidine twice a day but describes dysphagia with every meal for at least a year.  She drinks only one cup of coffee per week. No alcohol ,cola, tea,chocolate or  smoking.  She describes some fatigue. She also has had some chills. She has sweats but is not sure whether these are hot flashes. She has had dark urine which resolves with fluid intake. She also has constipation for which she's been taking Senokot.  She denies other GI or genitourinary symptoms.  She does have diffuse pain involving chest, upper extremities and hands ('my upper body")which is aggravated when she's trying to rest;pain is better with walking.  PMH of IBS,hepatosteatosis, GERD & Fibromyalgia. Esophagitis ,non erosive gastritis ,& multiple gastric polyps 2-4 mm in gastric body & fundus @ upper endoscopy 11/24/2012.No colonoscopy on record; she states she has had a colonoscopy. S/P TAH & cholecystectomy. Father had colon cancer.  A1c 6.3% ;TSH 0.58; LDL 223 & TG 237 in 02/2014. Review of Systems Unexplained weight loss, abdominal pain, significant dyspepsia,  melena, rectal bleeding, or persistently small caliber stools are denied.  She denies persistent clay colored stools.  Dysuria, pyuria, hematuria, frequency, nocturia or polyuria are denied.    Objective:   Physical Exam  General appearance is one of good health and nourishment w/o distress.BMI: 35.9  Eyes: No conjunctival inflammation or scleral icterus is present.  Oral  exam: Dental hygiene is good; lips and gums are healthy appearing.There is  oropharyngeal erythema ;no exudate noted.   Heart:  Normal rate and regular rhythm. S1 and S2 normal without gallop, murmur, click, rub or other extra sounds     Lungs:Chest clear to auscultation; no wheezes, rhonchi,rales ,or rubs present.No increased work of breathing.   Abdomen: bowel sounds normal, soft and non-tender without masses, organomegaly or hernias noted.  No guarding or rebound . No tenderness over the flanks to percussion  Musculoskeletal: Able to lie flat and sit up without help. Negative straight leg raising bilaterally.minor knee crepitus  Skin:Warm & dry.  Intact without suspicious lesions or rashes ; no tenting  Lymphatic: No lymphadenopathy is noted about the head, neck, axilla             Assessment & Plan:  #1 abdominal pain with dysphagia #2upper body pain with PMH Fibromyalgia #3 fatigue #4 DM #5 HTN See orders & AVS

## 2014-08-02 NOTE — Progress Notes (Signed)
Pre visit review using our clinic review tool, if applicable. No additional management support is needed unless otherwise documented below in the visit note. 

## 2014-08-22 ENCOUNTER — Encounter: Payer: Self-pay | Admitting: Gastroenterology

## 2014-08-22 ENCOUNTER — Ambulatory Visit (INDEPENDENT_AMBULATORY_CARE_PROVIDER_SITE_OTHER): Payer: BLUE CROSS/BLUE SHIELD | Admitting: Gastroenterology

## 2014-08-22 VITALS — BP 134/76 | HR 76 | Ht 67.0 in | Wt 235.2 lb

## 2014-08-22 DIAGNOSIS — R140 Abdominal distension (gaseous): Secondary | ICD-10-CM

## 2014-08-22 DIAGNOSIS — K219 Gastro-esophageal reflux disease without esophagitis: Secondary | ICD-10-CM

## 2014-08-22 DIAGNOSIS — R143 Flatulence: Secondary | ICD-10-CM | POA: Diagnosis not present

## 2014-08-22 DIAGNOSIS — IMO0001 Reserved for inherently not codable concepts without codable children: Secondary | ICD-10-CM

## 2014-08-22 NOTE — Progress Notes (Signed)
History of Present Illness: This is a 59 year old female with a history of GERD with erosive esophagitis. EGD in 11/2012 showed LA class B erosive esophagitis. GES in 2013 showed normal emptying. Colonoscopy performed in June 2013 was normal. She underwent cholecystectomy for a low GB EF in 2009. She has had fullness following meals, occasional abdominal pain at night, gas, bloating and mild constipation.   Allergies  Allergen Reactions  . Crestor [Rosuvastatin Calcium]     Muscle aches  . Metformin And Related     abd pain   Outpatient Prescriptions Prior to Visit  Medication Sig Dispense Refill  . hydrochlorothiazide (HYDRODIURIL) 25 MG tablet TAKE 1 TABLET BY MOUTH DAILY 90 tablet 1  . KLOR-CON M20 20 MEQ tablet TAKE 1 TABLET TWICE DAILY 180 tablet 1  . Multiple Vitamin (MULTIVITAMIN) tablet Take 1 tablet by mouth daily.    Marland Kitchen omeprazole (PRILOSEC) 20 MG capsule Take 1 capsule (20 mg total) by mouth 2 (two) times daily before a meal. 60 capsule 1  . Probiotic Product (PROBIOTIC PO) 1 tablet daily.     Marland Kitchen zolpidem (AMBIEN) 5 MG tablet Take 5 mg by mouth at bedtime as needed for sleep.     No facility-administered medications prior to visit.   Past Medical History  Diagnosis Date  . GERD (gastroesophageal reflux disease)   . Hyperlipidemia   . Hyperthyroidism   . Benign fundic gland polyps of stomach   . IBS (irritable bowel syndrome)   . Arthritis   . Fibromyalgia   . Fatty liver   . Esophagitis     LA Class B   Past Surgical History  Procedure Laterality Date  . Cholecystectomy  2009    low GB EF, no gallstones  . Vaginal hysterectomy    . Laparoscopy      of adhesions/ of ovaries   History   Social History  . Marital Status: Married    Spouse Name: N/A  . Number of Children: N/A  . Years of Education: N/A   Occupational History  . Financial SVCS at a [employer]    Social History Main Topics  . Smoking status: Former Smoker -- 0.10 packs/day for 10 years      Types: Cigarettes    Quit date: 07/14/1981  . Smokeless tobacco: Never Used  . Alcohol Use: No  . Drug Use: No  . Sexual Activity: Not Currently   Other Topics Concern  . None   Social History Narrative   Family History  Problem Relation Age of Onset  . Colon cancer Father   . Diabetes Brother     Half  . Heart disease Brother   . Heart disease Mother   . Diabetes Sister      Physical Exam: General: Well developed , well nourished, obese, no acute distress Head: Normocephalic and atraumatic Eyes:  sclerae anicteric, EOMI Ears: Normal auditory acuity Mouth: No deformity or lesions Lungs: Clear throughout to auscultation Heart: Regular rate and rhythm; no murmurs, rubs or bruits Abdomen: Soft, non tender and non distended. No masses, hepatosplenomegaly or hernias noted. Normal Bowel sounds Musculoskeletal: Symmetrical with no gross deformities  Pulses:  Normal pulses noted Extremities: No clubbing, cyanosis, edema or deformities noted Neurological: Alert oriented x 4, grossly nonfocal Psychological:  Alert and cooperative. Normal mood and affect  Assessment and Recommendations:  1. GERD with a history of erosive esophagitis. Continue omeprazole 20 mg twice daily and standard antireflux measures.  2. Postprandial fullness, gas and  bloating. Symptoms suspicious for gastroparesis. Testing in 2013 did not reveal gastroparesis however I feel we should repeat this evaluation. Schedule repeat gastric emptying scan. Low gas diet. Gas-X 4 times a day as needed. Return office visit one month.  3. Mild constipation. Increase dietary fiber and water intake. Continue daily stool softener. Consider adding MiraLAX 1-2 times daily if results are not adequate.  4. Family history of colon cancer. Five-year interval surveillance colonoscopy due June 2018.

## 2014-08-22 NOTE — Patient Instructions (Addendum)
You have been scheduled for a gastric emptying scan at [facility] on 09/09/14 at 7:00am. Please arrive at least 15 minutes prior to your appointment for registration. Please make certain not to have anything to eat or drink after midnight the night before your test. Hold all stomach medications (ex: Zofran, phenergan, Reglan) 48 hours prior to your test. If you need to reschedule your appointment, please contact radiology scheduling at [phone]. _____________________________________________________________________ A gastric-emptying study measures how long it takes for food to move through your stomach. There are several ways to measure stomach emptying. In the most common test, you eat food that contains a small amount of radioactive material. A scanner that detects the movement of the radioactive material is placed over your abdomen to monitor the rate at which food leaves your stomach. This test normally takes about 2 hours to complete. _____________________________________________________________________   [name] can use Gas-X four times a day as needed for gas and bloating.  You have been given a Gas prevention diet.  Patient advised to avoid spicy, acidic, citrus, chocolate, mints, fruit and fruit juices.  Limit the intake of caffeine, alcohol and Soda.  Don't exercise too soon after eating.  Don't lie down within 3-4 hours of eating.  Elevate the head of your bed.  Your follow up appointment with Dr. Fuller Plan is on 09/27/14 at 9:00am. If you need to reschedule or cancel, please call [phone].  Thank you for choosing me and [facility].  Pricilla Riffle. Dagoberto Ligas., MD., Marval Regal

## 2014-09-09 ENCOUNTER — Ambulatory Visit (HOSPITAL_COMMUNITY)
Admission: RE | Admit: 2014-09-09 | Discharge: 2014-09-09 | Disposition: A | Discharge status: Home / Self Care | Payer: BLUE CROSS/BLUE SHIELD | Source: Ambulatory Visit | Attending: Gastroenterology | Admitting: Gastroenterology

## 2014-09-09 DIAGNOSIS — R140 Abdominal distension (gaseous): Secondary | ICD-10-CM

## 2014-09-09 DIAGNOSIS — IMO0001 Reserved for inherently not codable concepts without codable children: Secondary | ICD-10-CM

## 2014-09-09 DIAGNOSIS — K219 Gastro-esophageal reflux disease without esophagitis: Secondary | ICD-10-CM

## 2014-09-09 MED ORDER — TECHNETIUM TC 99M SULFUR COLLOID
2.0000 | Freq: Once | INTRAVENOUS | Status: AC | PRN
  Administered 2014-09-09: 2 via INTRAVENOUS

## 2014-09-29 ENCOUNTER — Other Ambulatory Visit: Payer: Self-pay | Admitting: Internal Medicine

## 2014-09-30 ENCOUNTER — Ambulatory Visit (INDEPENDENT_AMBULATORY_CARE_PROVIDER_SITE_OTHER): Payer: BLUE CROSS/BLUE SHIELD | Admitting: Gastroenterology

## 2014-09-30 ENCOUNTER — Encounter: Payer: Self-pay | Admitting: Gastroenterology

## 2014-09-30 VITALS — BP 128/82 | HR 84 | Ht 67.0 in | Wt 236.1 lb

## 2014-09-30 DIAGNOSIS — K2100 Gastro-esophageal reflux disease with esophagitis, without bleeding: Secondary | ICD-10-CM

## 2014-09-30 DIAGNOSIS — R140 Abdominal distension (gaseous): Secondary | ICD-10-CM

## 2014-09-30 DIAGNOSIS — K5900 Constipation, unspecified: Secondary | ICD-10-CM

## 2014-09-30 NOTE — Patient Instructions (Addendum)
Continue your current medications.   Follow up with Dr Fuller Plan in 1 year please.   I appreciate the opportunity to care for you.

## 2014-09-30 NOTE — Progress Notes (Signed)
    History of Present Illness: This is a 59 year old female who returns for follow-up of abdominal bloating, constipation and GERD. She underwent a 4 hour gastric emptying scan which was normal. With improved treatment of her constipation and the regular use of Gas-X her abdominal bloating has resolved.  Current Medications, Allergies, Past Medical History, Past Surgical History, Family History and Social History were reviewed in [employer] record.  Physical Exam: General: Well developed , well nourished, no acute distress Head: Normocephalic and atraumatic Eyes:  sclerae anicteric, EOMI Ears: Normal auditory acuity Mouth: No deformity or lesions Lungs: Clear throughout to auscultation Heart: Regular rate and rhythm; no murmurs, rubs or bruits Abdomen: Soft, non tender and non distended. No masses, hepatosplenomegaly or hernias noted. Normal Bowel sounds Musculoskeletal: Symmetrical with no gross deformities  Pulses:  Normal pulses noted Extremities: No clubbing, cyanosis, edema or deformities noted Neurological: Alert oriented x 4, grossly nonfocal Psychological:  Alert and cooperative. Normal mood and affect  Assessment and Recommendations:  1. GERD with a history of erosive esophagitis. Continue omeprazole 20 mg twice daily and standard antireflux measures. REV in 1 year.   2. Postprandial fullness, gas and bloating. Likely exacerbated by constipation. Gastric emptying scan was normal. Low gas diet. Gas-X 4 times a day as needed.   3. Constipation. Continue high fiber dietary fiber and adequate water intake. Continue daily stool softener. Add MiraLAX 1-2 times daily if results are not adequate.  4. Family history of colon cancer. Five-year interval surveillance colonoscopy due June 2018.  15 minutes of face-to-face time spent with patient. Greater than 50% of time was spent counseling and coordinating care.

## 2014-10-07 ENCOUNTER — Telehealth: Payer: Self-pay | Admitting: Internal Medicine

## 2014-10-07 NOTE — Telephone Encounter (Signed)
Spoke with patient who need a letter for her to get her ergonomically correct chair replaced. Letter typed and faxed to her, pt notified.

## 2014-10-07 NOTE — Telephone Encounter (Signed)
Pt called in and said that she is needing a note from Dr Waynard Reeds for a particular chair at her work.  Pt needs a replace chair.  She did not have a lot of detail about this chair. She just stated that she has back problems and can not sit in just any chair.      Best number [phone]

## 2014-10-26 ENCOUNTER — Telehealth: Payer: Self-pay | Admitting: Internal Medicine

## 2014-10-28 ENCOUNTER — Ambulatory Visit (INDEPENDENT_AMBULATORY_CARE_PROVIDER_SITE_OTHER): Payer: BLUE CROSS/BLUE SHIELD | Admitting: Internal Medicine

## 2014-10-28 ENCOUNTER — Other Ambulatory Visit: Payer: Self-pay | Admitting: Internal Medicine

## 2014-10-28 ENCOUNTER — Encounter: Payer: Self-pay | Admitting: Internal Medicine

## 2014-10-28 ENCOUNTER — Other Ambulatory Visit (INDEPENDENT_AMBULATORY_CARE_PROVIDER_SITE_OTHER): Payer: BLUE CROSS/BLUE SHIELD

## 2014-10-28 VITALS — BP 122/76 | HR 75 | Temp 98.0°F | Resp 16 | Wt 237.0 lb

## 2014-10-28 DIAGNOSIS — G479 Sleep disorder, unspecified: Secondary | ICD-10-CM | POA: Diagnosis not present

## 2014-10-28 DIAGNOSIS — R0789 Other chest pain: Secondary | ICD-10-CM

## 2014-10-28 DIAGNOSIS — R609 Edema, unspecified: Secondary | ICD-10-CM

## 2014-10-28 LAB — TROPONIN I: TNIDX: 0.01 ug/L (ref 0.00–0.06)

## 2014-10-28 MED ORDER — ZOLPIDEM TARTRATE 5 MG PO TABS
5.0000 mg | ORAL_TABLET | Freq: Every evening | ORAL | Status: DC | PRN
Start: 2014-10-28 — End: 2014-10-28

## 2014-10-28 MED ORDER — HYDROCHLOROTHIAZIDE 25 MG PO TABS: 25.0000 mg | ORAL_TABLET | Freq: Every day | ORAL | Status: DC

## 2014-10-28 MED ORDER — CLONAZEPAM 0.5 MG PO TABS: ORAL_TABLET | ORAL | Status: DC

## 2014-10-28 NOTE — Progress Notes (Signed)
Pre visit review using our clinic review tool, if applicable. No additional management support is needed unless otherwise documented below in the visit note. 

## 2014-10-28 NOTE — Progress Notes (Signed)
   Subjective:    Patient ID: [name], female    DOB: Aug 27, 1955, 59 y.o.   MRN: [id number]  HPI  She is here to refill her HCTZ for hypertension and generic Ambien for sleep disorder.  She does avoid fried foods and red meat. She does eat salt. She's not monitoring blood pressure. She's been walking 4 days a week for 20-25 minutes without associated cardio pulmonary symptoms.   For the last week she's had intermittent diffuse chest pain which is usually associated with left upper extremity pain. This occurs at rest and not with the exercise. She does have some diaphoresis with this but no nausea. She's also had some intermittent calf pain with walking. She has intermittent edema of the right lower extremity. She's also noticed swelling in the right upper extremity. She denies any associated shortness of breath or hemoptysis.  Her mother had a myocardial infarction at age 2. There is no family history of stroke  Her sleep disorder is in the context of stress worrying about her husband who has prostate cancer. This is manifested as waking up at 2 AM after she goes to sleep at 10 PM. She has difficulty going back to sleep. She has no associated comorbidity such as restless leg syndrome or apnea.  Her husband states she does snore.She has had a sleep study which was negative.  She does have some dyspepsia but no other GI symptoms.  She had chemistry studies done 3/1 which revealed hyperglycemia. A1c was 6.2%. Renal function & K+ were normal.   Review of Systems   Palpitations, tachycardia, exertional dyspnea,or  paroxysmal nocturnal dyspnea are absent. No unexplained weight loss, abdominal pain,  dysphagia, melena, rectal bleeding, or persistently small caliber stools.       Objective:   Physical Exam  Pertinent or positive findings include: She has slight crepitus of the knees. Deep reflexes are 0+ at the knees. Homans sign is negative  General appearance :adequately  nourished; in no distress. She is very articulate individual. BMI: 37.11 Eyes: No conjunctival inflammation or scleral icterus is present. Oral exam:  Lips and gums are healthy appearing.There is no oropharyngeal erythema or exudate noted. Dental hygiene is good. Heart:  Normal rate and regular rhythm. S1 and S2 normal without gallop, murmur, click, rub or other extra sounds   Lungs:Chest clear to auscultation; no wheezes, rhonchi,rales ,or rubs present.No increased work of breathing.  Abdomen: bowel sounds normal, soft and non-tender without masses, organomegaly or hernias noted.  No guarding or rebound.  Vascular : all pulses equal ; no bruits present. Skin:Warm & dry.  Intact without suspicious lesions or rashes ; no tenting Lymphatic: No lymphadenopathy is noted about the head, neck, axilla  Neuro: Strength, tone normal.        Assessment & Plan:  #1 atypical chest pain. EKG is normal.  #2 Intermittent edema right lower extremity  & right upper extremity. D-dimer will be collected  #3 sleep disorder. The adverse or potential risk of zolpidem were discussed. She was offered a trial of clonazepam 0.5 mg if she awakens from sleep. Sleep hygiene was discussed.

## 2014-10-28 NOTE — Telephone Encounter (Signed)
prilosec rx sent to pharm

## 2014-10-28 NOTE — Patient Instructions (Signed)
To prevent sleep dysfunction follow these instructions for sleep hygiene. Do not read, watch TV, or eat in bed. Do not get into bed until you are ready to turn off the light &  to go to sleep. Do not ingest stimulants ( decongestants, diet pills, nicotine, caffeine) after the evening meal.   Reflux of gastric acid may be asymptomatic as this may occur mainly during sleep.The triggers for reflux  include stress; the "aspirin family" ; alcohol; peppermint; and caffeine (coffee, tea, cola, and chocolate). The aspirin family would include aspirin and the nonsteroidal agents such as ibuprofen &  Naproxen. Tylenol would not cause reflux. If having symptoms ; food & drink should be avoided for @ least 2 hours before going to bed.   Your next office appointment will be determined based upon review of your pending labs  . Those written interpretation of the lab results and instructions will be transmitted to you by My Chart Critical results will be called.  Followup as needed for any active or acute issue. Please report any significant change in your symptoms.

## 2014-11-11 ENCOUNTER — Other Ambulatory Visit: Payer: Self-pay | Admitting: Emergency Medicine

## 2014-11-11 MED ORDER — POTASSIUM CHLORIDE CRYS ER 20 MEQ PO TBCR: 20.0000 meq | EXTENDED_RELEASE_TABLET | Freq: Two times a day (BID) | ORAL | Status: DC

## 2014-11-11 NOTE — Telephone Encounter (Signed)
Rx for Klor-Con M20 sent to pharm

## 2014-11-25 ENCOUNTER — Ambulatory Visit (INDEPENDENT_AMBULATORY_CARE_PROVIDER_SITE_OTHER): Payer: BLUE CROSS/BLUE SHIELD | Admitting: Internal Medicine

## 2014-11-25 VITALS — BP 126/74 | HR 79 | Temp 98.2°F | Resp 18 | Ht 67.0 in | Wt 239.0 lb

## 2014-11-25 DIAGNOSIS — H9202 Otalgia, left ear: Secondary | ICD-10-CM

## 2014-11-25 DIAGNOSIS — H6122 Impacted cerumen, left ear: Secondary | ICD-10-CM

## 2014-11-25 MED ORDER — NEOMYCIN-POLYMYXIN-HC 3.5-10000-1 OT SUSP: 3.0000 [drp] | Freq: Four times a day (QID) | OTIC | Status: DC

## 2014-11-25 NOTE — Progress Notes (Signed)
   Subjective:  This chart was scribed for Renee Lin, MD by [name], Medical Scribe. This patient was seen in Room 8 and the patient's care was started at 6:02 PM.   Patient ID: [name], female    DOB: [birth year], 59 y.o.   MRN: [id number]  HPI HPI Comments: [name] is a 59 y.o. female who presents to [facility] complaining of left otalgia, gradual onset few days ago.  She notes that she has associative symptoms of blurred vision, and headache. Also tenderness over the left side of the face. However she denies having trouble hearing, or any allergies. She notes that she has not recently been swimming.  No sneezing or coughing. No rash on the face.  Patient Active Problem List   Diagnosis Date Noted  . Routine general medical examination at a health care facility 02/08/2014  . Infected cyst of skin 02/08/2014  . Obesity (BMI 30-39.9) 11/10/2013  . Obesity, Class II, BMI 35-39.9, with comorbidity 01/28/2013  . DJD (degenerative joint disease) of knee 11/08/2011  . Persistent disorder of initiating or maintaining sleep 09/30/2011  . BACK PAIN, LUMBAR, WITH RADICULOPATHY 08/09/2009  . HYPERTENSION, BENIGN ESSENTIAL 08/03/2009  . FATTY LIVER DISEASE 10/25/2008  . FIBROMYALGIA 10/25/2008  . Diabetes type 2, controlled 05/25/2008  . Hyperlipidemia with target LDL less than 100 05/25/2008  . GERD 05/25/2008  . Irritable bowel syndrome 05/25/2008     Review of Systems  HENT: Positive for ear pain. Negative for hearing loss.   Eyes: Positive for visual disturbance (blurred vision ).  Neurological: Positive for headaches.       Objective:   Physical Exam  Constitutional: She is oriented to person, place, and time. She appears well-developed and well-nourished. No distress.  HENT:  Head: Normocephalic and atraumatic.  Right Ear: External ear normal.  Left canal completely occluded by cerumen. Tragus pull not tender Teeth clear  Eyes:  EOM are normal. Pupils are equal, round, and reactive to light.  Neck: Normal range of motion. Neck supple. No thyromegaly present.  Cardiovascular: Normal rate.   Pulmonary/Chest: Effort normal.  Lymphadenopathy:    She has no cervical adenopathy.  Neurological: She is alert and oriented to person, place, and time. No cranial nerve deficit.  Skin: Skin is warm and dry.  There is no rash on the left side of the face or around the ear or neck. There is no hyperesthesia. She is tender to deep palpation in the masseter muscles but no TMJ tenderness.  Psychiatric: She has a normal mood and affect. Her behavior is normal.  Nursing note and vitals reviewed.  BP 126/74 mmHg  Pulse 79  Temp(Src) 98.2 F (36.8 C) (Oral)  Resp 18  Ht 5\' 7"  (1.702 m)  Wt 239 lb (108.41 kg)  BMI 37.42 kg/m2  SpO2 98%    post irrigation the wax is removed from the left ear and the canal remains erythematous but without changes in the tympanic membrane   Assessment & Plan:  I have completed the patient encounter in its entirety as documented by the scribe, with editing by me where necessary. [name] P. Laney Pastor, M.D.  Otalgia of left ear  Cerumen impaction, left  Cortisporin Otic suspension Follow-up at once for the appearance of any rash on the left side of the face or scalp or around the ear

## 2014-11-28 ENCOUNTER — Telehealth: Payer: Self-pay

## 2014-11-28 NOTE — Telephone Encounter (Signed)
Please advise. I did not see any notes about this.

## 2014-11-28 NOTE — Telephone Encounter (Signed)
I have no clear diagnosis so needs reeval before further treatment

## 2014-11-28 NOTE — Telephone Encounter (Signed)
Spoke with pt, advised to RTC. Pt understood.

## 2014-11-28 NOTE — Telephone Encounter (Signed)
Pt states she was seen by Dr.Doolittle and told if no better, he would call in some pain medicine but she think she may need an antibiotic instead. Please call [phone]     [facility]

## 2014-12-01 ENCOUNTER — Ambulatory Visit (INDEPENDENT_AMBULATORY_CARE_PROVIDER_SITE_OTHER): Payer: BLUE CROSS/BLUE SHIELD | Admitting: Emergency Medicine

## 2014-12-01 VITALS — BP 134/84 | HR 75 | Temp 98.5°F | Resp 18 | Ht 68.0 in | Wt 238.0 lb

## 2014-12-01 DIAGNOSIS — L0201 Cutaneous abscess of face: Secondary | ICD-10-CM | POA: Diagnosis not present

## 2014-12-01 DIAGNOSIS — L03211 Cellulitis of face: Secondary | ICD-10-CM | POA: Diagnosis not present

## 2014-12-01 DIAGNOSIS — B029 Zoster without complications: Secondary | ICD-10-CM

## 2014-12-01 MED ORDER — VALACYCLOVIR HCL 1 G PO TABS: 1000.0000 mg | ORAL_TABLET | Freq: Three times a day (TID) | ORAL | Status: DC

## 2014-12-01 MED ORDER — VALACYCLOVIR HCL 1 G PO TABS: 1000.0000 mg | ORAL_TABLET | Freq: Two times a day (BID) | ORAL | Status: DC

## 2014-12-01 MED ORDER — HYDROCODONE-ACETAMINOPHEN 5-325 MG PO TABS: 1.0000 | ORAL_TABLET | ORAL | Status: DC | PRN

## 2014-12-01 MED ORDER — MUPIROCIN 2 % EX OINT: 1.0000 "application " | TOPICAL_OINTMENT | Freq: Three times a day (TID) | CUTANEOUS | Status: DC

## 2014-12-01 NOTE — Progress Notes (Signed)
Subjective:  Patient ID: [name], female    DOB: [birth year]  Age: 59 y.o. MRN: [id number]  CC: Follow-up; Ear Pain; Facial Pain; sore in nose; and Herpes Zoster   HPI [name] presents  Patient was treated for ear pain. She was found to have a cerumen impaction. That was irrigated free. She still has pain in her ear now spread to the left side of her face. She denies any fever chills cough coryza. She does have a [name] of multiple episodes of shingles. No rash. No facial asymmetry or difficulties with speech. No numbness tingling or weakness in her extremities. No difficulty expressing her thoughts. No slurred speech or facial injury  [name] [name] has a past medical [name] of GERD (gastroesophageal reflux disease); Hyperlipidemia; Hyperthyroidism; Benign fundic gland polyps of stomach; IBS (irritable bowel syndrome); Arthritis; Fibromyalgia; Fatty liver; and Esophagitis.   She has past surgical [name] that includes Cholecystectomy (2009); Vaginal hysterectomy; and laparoscopy.   Her  family [name] includes Colon cancer in her father; Diabetes in her brother and sister; Heart disease in her brother and mother.  She   reports that she quit smoking about 33 years ago. Her smoking use included Cigarettes. She has a 1 pack-year smoking [name]. She has never used smokeless tobacco. She reports that she does not drink alcohol or use illicit drugs.  Outpatient Prescriptions Prior to Visit  Medication Sig Dispense Refill  . Cholecalciferol (VITAMIN D PO) Take 3 tablets by mouth daily.    . clonazePAM (KLONOPIN) 0.5 MG tablet 1 each night if you awaken in place of Zolpidem 30 tablet 1  . diclofenac (VOLTAREN) 75 MG EC tablet Take 75 mg by mouth as needed.   12  . Docusate Calcium (STOOL SOFTENER PO) Take 1 tablet by mouth daily.    . hydrochlorothiazide (HYDRODIURIL) 25 MG tablet Take 1 tablet (25 mg total) by mouth daily. 90 tablet 1  . Multiple Vitamin (MULTIVITAMIN)  tablet Take 1 tablet by mouth daily.    Marland Kitchen neomycin-polymyxin-hydrocortisone (CORTISPORIN) 3.5-10000-1 otic suspension Place 3 drops into the left ear 4 (four) times daily. 10 mL 0  . omeprazole (PRILOSEC) 20 MG capsule TAKE 1 CAPSULE BY MOUTH 2 (TWO) TIMES DAILY BEFORE A MEAL. 60 capsule 3  . potassium chloride SA (KLOR-CON M20) 20 MEQ tablet Take 1 tablet (20 mEq total) by mouth 2 (two) times daily. 180 tablet 1  . Probiotic Product (PROBIOTIC PO) 1 tablet daily.      No facility-administered medications prior to visit.    [name]   Social [name]  . Marital Status: Married    Spouse Name: N/A  . Number of Children: N/A  . Years of Education: N/A   Occupational [name]  . Financial SVCS at a [employer]    Social [name] Main Topics  . Smoking status: Former Smoker -- 0.10 packs/day for 10 years    Types: Cigarettes    Quit date: 07/14/1981  . Smokeless tobacco: Never Used  . Alcohol Use: No  . Drug Use: No  . Sexual Activity: Not Currently   Other Topics Concern  . None   Social [name] Narrative     Review of Systems  Constitutional: Negative for fever, chills and appetite change.  HENT: Positive for ear pain. Negative for congestion, postnasal drip, sinus pressure and sore throat.   Eyes: Negative for pain and redness.  Respiratory: Negative for cough, shortness of breath and wheezing.   Cardiovascular: Negative for leg swelling.  Gastrointestinal: Negative for nausea, vomiting, abdominal pain, diarrhea, constipation and blood in stool.  Endocrine: Negative for polyuria.  Genitourinary: Negative for dysuria, urgency, frequency and flank pain.  Musculoskeletal: Negative for gait problem.  Skin: Negative for rash.  Neurological: Negative for weakness and headaches.  Psychiatric/Behavioral: Negative for confusion and decreased concentration. The patient is not nervous/anxious.     Objective:  BP 134/84 mmHg  Pulse 75  Temp(Src) 98.5 F (36.9 C) (Oral)  Resp  18  Ht 5\' 8"  (1.727 m)  Wt 238 lb (107.956 kg)  BMI 36.20 kg/m2  SpO2 94%  Physical Exam  Constitutional: She is oriented to person, place, and time. She appears well-developed and well-nourished.  HENT:  Head: Normocephalic and atraumatic.  Eyes: Conjunctivae are normal. Pupils are equal, round, and reactive to light.  Neck: Normal range of motion. Neck supple.  Cardiovascular: Normal rate.   Pulmonary/Chest: Effort normal. No respiratory distress.  Abdominal: Soft.  Musculoskeletal: She exhibits no edema.  Neurological: She is alert and oriented to person, place, and time.  Skin: Skin is warm and dry.  Psychiatric: She has a normal mood and affect. Her behavior is normal. Thought content normal.      Assessment & Plan:   There are no diagnoses linked to this encounter. I am having [name] maintain her Probiotic Product (PROBIOTIC PO), multivitamin, Docusate Calcium (STOOL SOFTENER PO), diclofenac, Cholecalciferol (VITAMIN D PO), omeprazole, hydrochlorothiazide, clonazePAM, potassium chloride SA, and neomycin-polymyxin-hydrocortisone.  No orders of the defined types were placed in this encounter.    Appropriate red flag conditions were discussed with the patient as well as actions that should be taken.  Patient expressed his understanding.  Follow-up: No Follow-up on file.  Roselee Culver, MD

## 2015-01-18 ENCOUNTER — Other Ambulatory Visit: Payer: Self-pay | Admitting: Internal Medicine

## 2015-01-18 LAB — HM MAMMOGRAPHY: HM MAMMO: NORMAL (ref 0–4)

## 2015-01-18 LAB — HM PAP SMEAR

## 2015-01-19 ENCOUNTER — Other Ambulatory Visit: Payer: Self-pay

## 2015-01-19 MED ORDER — OMEPRAZOLE 20 MG PO CPDR: DELAYED_RELEASE_CAPSULE | ORAL | Status: DC

## 2015-02-07 ENCOUNTER — Other Ambulatory Visit: Payer: Self-pay | Admitting: Internal Medicine

## 2015-02-08 ENCOUNTER — Other Ambulatory Visit: Payer: Self-pay | Admitting: Emergency Medicine

## 2015-02-08 DIAGNOSIS — G479 Sleep disorder, unspecified: Secondary | ICD-10-CM

## 2015-02-08 MED ORDER — CLONAZEPAM 0.5 MG PO TABS: ORAL_TABLET | ORAL | Status: DC

## 2015-02-08 NOTE — Telephone Encounter (Signed)
Klonopin faxed to pharm  

## 2015-04-20 ENCOUNTER — Ambulatory Visit (INDEPENDENT_AMBULATORY_CARE_PROVIDER_SITE_OTHER): Payer: BLUE CROSS/BLUE SHIELD | Admitting: Emergency Medicine

## 2015-04-20 VITALS — BP 128/80 | HR 78 | Temp 98.3°F | Resp 16 | Ht 68.0 in | Wt 240.0 lb

## 2015-04-20 DIAGNOSIS — J0140 Acute pansinusitis, unspecified: Secondary | ICD-10-CM | POA: Diagnosis not present

## 2015-04-20 DIAGNOSIS — E119 Type 2 diabetes mellitus without complications: Secondary | ICD-10-CM

## 2015-04-20 LAB — POCT GLYCOSYLATED HEMOGLOBIN (HGB A1C): Hemoglobin A1C: 6.8

## 2015-04-20 LAB — HEMOGLOBIN A1C: Hgb A1c MFr Bld: 6.8 % — AB (ref 4.0–6.0)

## 2015-04-20 MED ORDER — AMOXICILLIN-POT CLAVULANATE 875-125 MG PO TABS: 1.0000 | ORAL_TABLET | Freq: Two times a day (BID) | ORAL | Status: DC

## 2015-04-20 MED ORDER — HYDROCOD POLST-CPM POLST ER 10-8 MG/5ML PO SUER: 5.0000 mL | Freq: Two times a day (BID) | ORAL | Status: DC

## 2015-04-20 MED ORDER — PSEUDOEPHEDRINE-GUAIFENESIN ER 60-600 MG PO TB12: 1.0000 | ORAL_TABLET | Freq: Two times a day (BID) | ORAL | Status: DC

## 2015-04-20 NOTE — Patient Instructions (Signed)

## 2015-04-20 NOTE — Progress Notes (Signed)
Subjective:  Patient ID: [name], female    DOB: 09-20-1955  Age: 59 y.o. MRN: [id number]  CC: Cough and Sore Throat   HPI [name] presents  with nasal congestion postnasal drainage that is purulent collar. She denies any fever chills. Does feel fatigued. She has sore throat. She has a cough productive of scant mucopurulent sputum. She denies any wheezing or shortness of breath. No rash. No nausea vomiting or stool change. She has no increase in her blood sugar.  History [name] has a past medical history of GERD (gastroesophageal reflux disease); Hyperlipidemia; Hyperthyroidism; Benign fundic gland polyps of stomach; IBS (irritable bowel syndrome); Arthritis; Fibromyalgia; Fatty liver; and Esophagitis.   She has past surgical history that includes Cholecystectomy (2009); Vaginal hysterectomy; and laparoscopy.   Her  family history includes Colon cancer in her father; Diabetes in her brother and sister; Heart disease in her brother and mother.  She   reports that she quit smoking about 33 years ago. Her smoking use included Cigarettes. She has a 1 pack-year smoking history. She has never used smokeless tobacco. She reports that she does not drink alcohol or use illicit drugs.  Outpatient Prescriptions Prior to Visit  Medication Sig Dispense Refill  . Cholecalciferol (VITAMIN D PO) Take 3 tablets by mouth daily.    . clonazePAM (KLONOPIN) 0.5 MG tablet 1 each night if you awaken in place of Zolpidem 30 tablet 0  . diclofenac (VOLTAREN) 75 MG EC tablet Take 75 mg by mouth as needed.   12  . Docusate Calcium (STOOL SOFTENER PO) Take 1 tablet by mouth daily.    . hydrochlorothiazide (HYDRODIURIL) 25 MG tablet Take 1 tablet (25 mg total) by mouth daily. 90 tablet 1  . Multiple Vitamin (MULTIVITAMIN) tablet Take 1 tablet by mouth daily.    Marland Kitchen omeprazole (PRILOSEC) 20 MG capsule TAKE 1 CAPSULE BY MOUTH 2 (TWO) TIMES DAILY BEFORE A MEAL. 60 capsule 3  . potassium chloride SA  (KLOR-CON M20) 20 MEQ tablet Take 1 tablet (20 mEq total) by mouth 2 (two) times daily. 180 tablet 1  . Probiotic Product (PROBIOTIC PO) 1 tablet daily.     . valACYclovir (VALTREX) 1000 MG tablet Take 1 tablet (1,000 mg total) by mouth 3 (three) times daily. 30 tablet 0  . HYDROcodone-acetaminophen (NORCO) 5-325 MG per tablet Take 1-2 tablets by mouth every 4 (four) hours as needed. (Patient not taking: Reported on 04/20/2015) 30 tablet 0  . mupirocin ointment (BACTROBAN) 2 % Apply 1 application topically 3 (three) times daily. (Patient not taking: Reported on 04/20/2015) 22 g 1  . neomycin-polymyxin-hydrocortisone (CORTISPORIN) 3.5-10000-1 otic suspension Place 3 drops into the left ear 4 (four) times daily. (Patient not taking: Reported on 04/20/2015) 10 mL 0  . omeprazole (PRILOSEC) 20 MG capsule TAKE 1 CAPSULE BY MOUTH 2 (TWO) TIMES DAILY BEFORE A MEAL. (Patient not taking: Reported on 04/20/2015) 60 capsule 3   No facility-administered medications prior to visit.    Social History   Social History  . Marital Status: Married    Spouse Name: N/A  . Number of Children: N/A  . Years of Education: N/A   Occupational History  . Financial SVCS at a [employer]    Social History Main Topics  . Smoking status: Former Smoker -- 0.10 packs/day for 10 years    Types: Cigarettes    Quit date: 07/14/1981  . Smokeless tobacco: Never Used  . Alcohol Use: No  . Drug Use: No  .  Sexual Activity: Not Currently   Other Topics Concern  . None   Social History Narrative     Review of Systems  Constitutional: Positive for fatigue. Negative for fever, chills and appetite change.  HENT: Positive for congestion, postnasal drip, rhinorrhea, sinus pressure and sore throat. Negative for ear pain.   Eyes: Negative for pain and redness.  Respiratory: Positive for cough. Negative for shortness of breath and wheezing.   Cardiovascular: Negative for leg swelling.  Gastrointestinal: Negative for  nausea, vomiting, abdominal pain, diarrhea, constipation and blood in stool.  Endocrine: Negative for polyuria.  Genitourinary: Negative for dysuria, urgency, frequency and flank pain.  Musculoskeletal: Negative for gait problem.  Skin: Negative for rash.  Neurological: Negative for weakness and headaches.  Psychiatric/Behavioral: Negative for confusion and decreased concentration. The patient is not nervous/anxious.     Objective:  BP 128/80 mmHg  Pulse 78  Temp(Src) 98.3 F (36.8 C) (Oral)  Resp 16  Ht 5\' 8"  (1.727 m)  Wt 240 lb (108.863 kg)  BMI 36.50 kg/m2  SpO2 95%  Physical Exam  Constitutional: She is oriented to person, place, and time. She appears well-developed and well-nourished. No distress.  HENT:  Head: Normocephalic and atraumatic.  Right Ear: External ear normal.  Left Ear: External ear normal.  Nose: Nose normal.  Eyes: Conjunctivae and EOM are normal. Pupils are equal, round, and reactive to light. No scleral icterus.  Neck: Normal range of motion. Neck supple. No tracheal deviation present.  Cardiovascular: Normal rate, regular rhythm and normal heart sounds.   Pulmonary/Chest: Effort normal. No respiratory distress. She has no wheezes. She has no rales.  Abdominal: She exhibits no mass. There is no tenderness. There is no rebound and no guarding.  Musculoskeletal: She exhibits no edema.  Lymphadenopathy:    She has no cervical adenopathy.  Neurological: She is alert and oriented to person, place, and time. Coordination normal.  Skin: Skin is warm and dry. No rash noted.  Psychiatric: She has a normal mood and affect. Her behavior is normal.      Assessment & Plan:   Ianthe was seen today for cough and sore throat.  Diagnoses and all orders for this visit:  Acute pansinusitis, recurrence not specified  Controlled type 2 diabetes mellitus without complication, without long-term current use of insulin (HCC) -     POCT glycosylated hemoglobin (Hb  A1C)  Other orders -     amoxicillin-clavulanate (AUGMENTIN) 875-125 MG tablet; Take 1 tablet by mouth 2 (two) times daily. -     pseudoephedrine-guaifenesin (MUCINEX D) 60-600 MG 12 hr tablet; Take 1 tablet by mouth every 12 (twelve) hours. -     chlorpheniramine-HYDROcodone (TUSSIONEX PENNKINETIC ER) 10-8 MG/5ML SUER; Take 5 mLs by mouth 2 (two) times daily.  I have discontinued Ms. Cygan's neomycin-polymyxin-hydrocortisone, HYDROcodone-acetaminophen, and mupirocin ointment. I am also having her start on amoxicillin-clavulanate, pseudoephedrine-guaifenesin, and chlorpheniramine-HYDROcodone. Additionally, I am having her maintain her Probiotic Product (PROBIOTIC PO), multivitamin, Docusate Calcium (STOOL SOFTENER PO), diclofenac, Cholecalciferol (VITAMIN D PO), hydrochlorothiazide, potassium chloride SA, valACYclovir, omeprazole, and clonazePAM.  Meds ordered this encounter  Medications  . amoxicillin-clavulanate (AUGMENTIN) 875-125 MG tablet    Sig: Take 1 tablet by mouth 2 (two) times daily.    Dispense:  20 tablet    Refill:  0  . pseudoephedrine-guaifenesin (MUCINEX D) 60-600 MG 12 hr tablet    Sig: Take 1 tablet by mouth every 12 (twelve) hours.    Dispense:  18 tablet  Refill:  0  . chlorpheniramine-HYDROcodone (TUSSIONEX PENNKINETIC ER) 10-8 MG/5ML SUER    Sig: Take 5 mLs by mouth 2 (two) times daily.    Dispense:  60 mL    Refill:  0    Appropriate red flag conditions were discussed with the patient as well as actions that should be taken.  Patient expressed his understanding.  Follow-up: Return if symptoms worsen or fail to improve.  Roselee Culver, MD   Results for orders placed or performed in visit on 04/20/15  POCT glycosylated hemoglobin (Hb A1C)  Result Value Ref Range   Hemoglobin A1C 6.8

## 2015-04-24 ENCOUNTER — Other Ambulatory Visit: Payer: Self-pay | Admitting: Internal Medicine

## 2015-05-03 ENCOUNTER — Encounter: Payer: Self-pay | Admitting: Family Medicine

## 2015-07-21 ENCOUNTER — Other Ambulatory Visit: Payer: Self-pay | Admitting: Internal Medicine

## 2015-07-26 ENCOUNTER — Telehealth: Payer: Self-pay

## 2015-07-26 ENCOUNTER — Ambulatory Visit (INDEPENDENT_AMBULATORY_CARE_PROVIDER_SITE_OTHER): Payer: BLUE CROSS/BLUE SHIELD | Admitting: Family Medicine

## 2015-07-26 VITALS — BP 128/86 | HR 87 | Temp 98.2°F | Resp 16 | Ht 68.0 in | Wt 242.0 lb

## 2015-07-26 DIAGNOSIS — M791 Myalgia: Secondary | ICD-10-CM | POA: Diagnosis not present

## 2015-07-26 DIAGNOSIS — Z5181 Encounter for therapeutic drug level monitoring: Secondary | ICD-10-CM | POA: Diagnosis not present

## 2015-07-26 DIAGNOSIS — J029 Acute pharyngitis, unspecified: Secondary | ICD-10-CM | POA: Diagnosis not present

## 2015-07-26 DIAGNOSIS — M609 Myositis, unspecified: Secondary | ICD-10-CM | POA: Diagnosis not present

## 2015-07-26 DIAGNOSIS — M7918 Myalgia, other site: Secondary | ICD-10-CM

## 2015-07-26 DIAGNOSIS — E119 Type 2 diabetes mellitus without complications: Secondary | ICD-10-CM

## 2015-07-26 DIAGNOSIS — IMO0001 Reserved for inherently not codable concepts without codable children: Secondary | ICD-10-CM

## 2015-07-26 LAB — POCT SEDIMENTATION RATE: POCT SED RATE: 26 mm/hr — AB (ref 0–22)

## 2015-07-26 LAB — CBC WITH DIFFERENTIAL/PLATELET
BASOS PCT: 0 % (ref 0–1)
Basophils Absolute: 0 10*3/uL (ref 0.0–0.1)
Eosinophils Absolute: 0.1 10*3/uL (ref 0.0–0.7)
Eosinophils Relative: 1 % (ref 0–5)
HCT: 41.2 % (ref 36.0–46.0)
HEMOGLOBIN: 14.8 g/dL (ref 12.0–15.0)
LYMPHS ABS: 2 10*3/uL (ref 0.7–4.0)
Lymphocytes Relative: 30 % (ref 12–46)
MCH: 30.9 pg (ref 26.0–34.0)
MCHC: 35.9 g/dL (ref 30.0–36.0)
MCV: 86 fL (ref 78.0–100.0)
MONOS PCT: 9 % (ref 3–12)
MPV: 9.1 fL (ref 8.6–12.4)
Monocytes Absolute: 0.6 10*3/uL (ref 0.1–1.0)
NEUTROS ABS: 4.1 10*3/uL (ref 1.7–7.7)
NEUTROS PCT: 60 % (ref 43–77)
Platelets: 246 10*3/uL (ref 150–400)
RBC: 4.79 MIL/uL (ref 3.87–5.11)
RDW: 15.4 % (ref 11.5–15.5)
WBC: 6.8 10*3/uL (ref 4.0–10.5)

## 2015-07-26 LAB — POCT GLYCOSYLATED HEMOGLOBIN (HGB A1C): Hemoglobin A1C: 6.9

## 2015-07-26 LAB — POCT INFLUENZA A/B
INFLUENZA A, POC: NEGATIVE
Influenza B, POC: NEGATIVE

## 2015-07-26 LAB — POCT RAPID STREP A (OFFICE): Rapid Strep A Screen: NEGATIVE

## 2015-07-26 MED ORDER — CYCLOBENZAPRINE HCL 5 MG PO TABS: 5.0000 mg | ORAL_TABLET | Freq: Three times a day (TID) | ORAL | Status: DC | PRN

## 2015-07-26 NOTE — Patient Instructions (Signed)

## 2015-07-26 NOTE — Progress Notes (Signed)
 Subjective:    Patient ID: [name], female    DOB: 10/08/1955, 59 y.o.   MRN: [id number] . Chief Complaint  Patient presents with  . Sore Throat    x 1 week  . Arm Swelling    right/ x 1 week  . other    pt has spot on her back that hurts and itches    HPI  Her throat started hurting this morning. No other sxs, can't tell about fevers due to hot flashes at baseline.  Did not get flu shot this year. No specific known sick contacts.   Has tried benadryl to help sleep which is impaired due to back and arm pain which is quite long standing but she "figures she will have it looked" at since she had to come in for the pharyngitis.  1 week of a tender spot on her back that she has not been able to see so really can't define. Has been very painful but is a little better today.   a1c ok  Past Medical History  Diagnosis Date  . GERD (gastroesophageal reflux disease)   . Hyperlipidemia   . Hyperthyroidism   . Benign fundic gland polyps of stomach   . IBS (irritable bowel syndrome)   . Arthritis   . Fibromyalgia   . Fatty liver   . Esophagitis     LA Class B   Past Surgical History  Procedure Laterality Date  . Cholecystectomy  2009    low GB EF, no gallstones  . Vaginal hysterectomy    . Laparoscopy      of adhesions/ of ovaries   Current Outpatient Prescriptions on File Prior to Visit  Medication Sig Dispense Refill  . Cholecalciferol (VITAMIN D PO) Take 3 tablets by mouth daily.    . clonazePAM (KLONOPIN) 0.5 MG tablet 1 each night if you awaken in place of Zolpidem 30 tablet 0  . Docusate Calcium (STOOL SOFTENER PO) Take 1 tablet by mouth daily.    . hydrochlorothiazide (HYDRODIURIL) 25 MG tablet TAKE 1 TABLET BY MOUTH DAILY. 90 tablet 0  . Multiple Vitamin (MULTIVITAMIN) tablet Take 1 tablet by mouth daily.    . omeprazole (PRILOSEC) 20 MG capsule TAKE 1 CAPSULE BY MOUTH 2 (TWO) TIMES DAILY BEFORE A MEAL. 60 capsule 3  . potassium chloride SA (KLOR-CON M20) 20 MEQ  tablet Take 1 tablet (20 mEq total) by mouth 2 (two) times daily. 180 tablet 1  . Probiotic Product (PROBIOTIC PO) 1 tablet daily.     . diclofenac (VOLTAREN) 75 MG EC tablet Take 75 mg by mouth as needed. Reported on 07/26/2015  12   No current facility-administered medications on file prior to visit.   Allergies  Allergen Reactions  . Crestor [Rosuvastatin Calcium]     Muscle aches  . Metformin And Related     abd pain   Family History  Problem Relation Age of Onset  . Colon cancer Father   . Diabetes Brother     Half  . Heart disease Brother   . Heart disease Mother   . Diabetes Sister    Social History   Social History  . Marital Status: Married    Spouse Name: N/A  . Number of Children: N/A  . Years of Education: N/A   Occupational History  . 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 Smoking status: Former Smoker -- 0.10 packs/day for 10 years      Types: Cigarettes    Quit date: 07/14/1981  . Smokeless tobacco: Never Used  . Alcohol Use: No  . Drug Use: No  . Sexual Activity: Not Currently   Other Topics Concern  . None   Social History Narrative      Review of Systems  Constitutional: Positive for diaphoresis, activity change and fatigue. Negative for fever, chills and appetite change.  HENT: Positive for sore throat. Negative for congestion, ear discharge, ear pain, mouth sores, nosebleeds, postnasal drip, rhinorrhea, sinus pressure, sneezing, trouble swallowing and voice change.   Eyes: Negative for photophobia and pain.  Respiratory: Negative for cough and shortness of breath.   Cardiovascular: Negative for chest pain.  Gastrointestinal: Positive for nausea and constipation. Negative for vomiting and abdominal pain.  Genitourinary: Negative for dysuria, frequency and decreased urine volume.  Musculoskeletal: Positive for myalgias, back pain, joint swelling and arthralgias. Negative for gait problem, neck pain and neck stiffness.   Skin: Positive for rash.  Neurological: Positive for headaches. Negative for dizziness, syncope, weakness and light-headedness.  Hematological: Negative for adenopathy.  Psychiatric/Behavioral: Positive for sleep disturbance.       Objective:  BP 128/86 mmHg  Pulse 87  Temp(Src) 98.2 F (36.8 C) (Oral)  Resp 16  Ht 5' 8" (1.727 m)  Wt 242 lb (109.77 kg)  BMI 36.80 kg/m2  SpO2 98%  Physical Exam  Constitutional: She is oriented to person, place, and time. She appears well-developed and well-nourished. No distress.  HENT:  Head: Normocephalic and atraumatic.  Right Ear: Tympanic membrane, external ear and ear canal normal.  Left Ear: Tympanic membrane, external ear and ear canal normal.  Nose: Nose normal. No mucosal edema or rhinorrhea.  Mouth/Throat: Uvula is midline, oropharynx is clear and moist and mucous membranes are normal. No oropharyngeal exudate.  Eyes: Conjunctivae are normal. Right eye exhibits no discharge. Left eye exhibits no discharge. No scleral icterus.  Neck: Normal range of motion. Neck supple.  Cardiovascular: Normal rate, regular rhythm, normal heart sounds and intact distal pulses.   Pulmonary/Chest: Effort normal and breath sounds normal.  Abdominal: Soft. Bowel sounds are normal. She exhibits no distension and no mass. There is no tenderness. There is no rebound and no guarding.  Musculoskeletal:       Right upper arm: Normal.  Lymphadenopathy:    She has no cervical adenopathy.  Neurological: She is alert and oriented to person, place, and time. She has normal strength. She displays no atrophy and no tremor. No cranial nerve deficit or sensory deficit. She exhibits normal muscle tone. Gait normal.  Reflex Scores:      Tricep reflexes are 2+ on the right side and 2+ on the left side.      Bicep reflexes are 2+ on the right side and 2+ on the left side.      Brachioradialis reflexes are 2+ on the right side and 2+ on the left side. Skin: Skin is warm  and dry. No rash noted. She is not diaphoretic. No erythema.  Pt's back where she is complaining of burnign and swelling appears and palpates completley normally  Psychiatric: She has a normal mood and affect. Her behavior is normal.      Results for orders placed or performed in visit on 07/26/15  Culture, Group A Strep  Result Value Ref Range   Organism ID, Bacteria Normal Upper Respiratory Flora    Organism ID, Bacteria No Beta Hemolytic Streptococci Isolated   CBC with Differential/Platelet  Result Value Ref   Range   WBC 6.8 4.0 - 10.5 K/uL   RBC 4.79 3.87 - 5.11 MIL/uL   Hemoglobin 14.8 12.0 - 15.0 g/dL   HCT 41.2 36.0 - 46.0 %   MCV 86.0 78.0 - 100.0 fL   MCH 30.9 26.0 - 34.0 pg   MCHC 35.9 30.0 - 36.0 g/dL   RDW 15.4 11.5 - 15.5 %   Platelets 246 150 - 400 K/uL   MPV 9.1 8.6 - 12.4 fL   Neutrophils Relative % 60 43 - 77 %   Neutro Abs 4.1 1.7 - 7.7 K/uL   Lymphocytes Relative 30 12 - 46 %   Lymphs Abs 2.0 0.7 - 4.0 K/uL   Monocytes Relative 9 3 - 12 %   Monocytes Absolute 0.6 0.1 - 1.0 K/uL   Eosinophils Relative 1 0 - 5 %   Eosinophils Absolute 0.1 0.0 - 0.7 K/uL   Basophils Relative 0 0 - 1 %   Basophils Absolute 0.0 0.0 - 0.1 K/uL   Smear Review Criteria for review not met   Comprehensive metabolic panel  Result Value Ref Range   Sodium 139 135 - 146 mmol/L   Potassium 4.1 3.5 - 5.3 mmol/L   Chloride 98 98 - 110 mmol/L   CO2 28 20 - 31 mmol/L   Glucose, Bld 91 65 - 99 mg/dL   BUN 10 7 - 25 mg/dL   Creat 0.76 0.50 - 0.99 mg/dL   Total Bilirubin 0.4 0.2 - 1.2 mg/dL   Alkaline Phosphatase 66 33 - 130 U/L   AST 13 10 - 35 U/L   ALT 33 (H) 6 - 29 U/L   Total Protein 7.9 6.1 - 8.1 g/dL   Albumin 4.4 3.6 - 5.1 g/dL   Calcium 9.8 8.6 - 10.4 mg/dL  POCT rapid strep A  Result Value Ref Range   Rapid Strep A Screen Negative Negative  POCT glycosylated hemoglobin (Hb A1C)  Result Value Ref Range   Hemoglobin A1C 6.9   POCT SEDIMENTATION RATE  Result Value Ref  Range   POCT SED RATE 26 (A) 0 - 22 mm/hr  POCT Influenza A/B  Result Value Ref Range   Influenza A, POC Negative Negative   Influenza B, POC Negative Negative       Assessment & Plan:   1. Acute pharyngitis, unspecified etiology   2. Type 2 diabetes mellitus without complication, without long-term current use of insulin (HCC) - F/u DM with PCP Dr. Jones w/ [facility] - well controlled.  3. Myalgia and myositis   4. Rhomboid muscle pain -   Rec heat to back, rhomboid stretching demonstrated. Try flexeril. Pt c/o burning and itching and someone told her it looked swollen and like urticaria but appears benign on exam - suspect muscle spasm but I guess could potentially be developing shingles though isolated to over right rhomboid for now - rtc immed if worsening or any rash  5. Medication monitoring encounter    Pt having a numerous amount of wide and varied complaints today but really no sig objective abnml found on exam or labs.   Orders Placed This Encounter  Procedures  . Culture, Group A Strep    Order Specific Question:  Source    Answer:  throat  . CBC with Differential/Platelet  . Comprehensive metabolic panel  . POCT rapid strep A  . POCT glycosylated hemoglobin (Hb A1C)  . POCT SEDIMENTATION RATE  . POCT Influenza A/B    Meds ordered this encounter    Medications  . cyclobenzaprine (FLEXERIL) 5 MG tablet    Sig: Take 1-2 tablets (5-10 mg total) by mouth 3 (three) times daily as needed for muscle spasms.    Dispense:  60 tablet    Refill:  0   Over 40 min spent in face-to-face evaluation of and consultation with patient and coordination of care.  Over 50% of this time was spent counseling this patient.  Eva Shaw, MD MPH   

## 2015-07-26 NOTE — Telephone Encounter (Signed)
[facility] called to give a report on a STAT CBC with diff.  States that everything was within normal range, nothing abnormal.  They are faxing over a copy of the results also. 07/26/15-MJ

## 2015-07-27 ENCOUNTER — Encounter: Payer: Self-pay | Admitting: Family Medicine

## 2015-07-27 LAB — COMPREHENSIVE METABOLIC PANEL
ALK PHOS: 66 U/L (ref 33–130)
ALT: 33 U/L — AB (ref 6–29)
AST: 13 U/L (ref 10–35)
Albumin: 4.4 g/dL (ref 3.6–5.1)
BUN: 10 mg/dL (ref 7–25)
CALCIUM: 9.8 mg/dL (ref 8.6–10.4)
CHLORIDE: 98 mmol/L (ref 98–110)
CO2: 28 mmol/L (ref 20–31)
Creat: 0.76 mg/dL (ref 0.50–0.99)
GLUCOSE: 91 mg/dL (ref 65–99)
POTASSIUM: 4.1 mmol/L (ref 3.5–5.3)
Sodium: 139 mmol/L (ref 135–146)
Total Bilirubin: 0.4 mg/dL (ref 0.2–1.2)
Total Protein: 7.9 g/dL (ref 6.1–8.1)

## 2015-07-27 NOTE — Telephone Encounter (Signed)
[place] [place], please let pt know that blood counts are normal - no signs of infection.

## 2015-07-28 LAB — CULTURE, GROUP A STREP: ORGANISM ID, BACTERIA: NORMAL

## 2015-07-28 NOTE — Telephone Encounter (Signed)
Pt notified of results

## 2015-07-29 ENCOUNTER — Encounter: Payer: Self-pay | Admitting: *Deleted

## 2015-07-30 ENCOUNTER — Encounter: Payer: Self-pay | Admitting: *Deleted

## 2015-08-04 ENCOUNTER — Telehealth: Payer: Self-pay

## 2015-08-04 NOTE — Telephone Encounter (Signed)
Please review

## 2015-08-04 NOTE — Telephone Encounter (Signed)
Patient was seen here on 07/26/2015. Patient stated she is not feeling better. She doesn't want to come in to be seen again. Patient request for the doctor to call her in an antibiotic into the pharmacy. [facility].

## 2015-08-06 ENCOUNTER — Ambulatory Visit (INDEPENDENT_AMBULATORY_CARE_PROVIDER_SITE_OTHER): Payer: BLUE CROSS/BLUE SHIELD

## 2015-08-06 ENCOUNTER — Ambulatory Visit (INDEPENDENT_AMBULATORY_CARE_PROVIDER_SITE_OTHER): Payer: BLUE CROSS/BLUE SHIELD | Admitting: Urgent Care

## 2015-08-06 VITALS — BP 120/76 | HR 102 | Temp 99.3°F | Resp 16 | Ht 68.0 in | Wt 237.0 lb

## 2015-08-06 DIAGNOSIS — J189 Pneumonia, unspecified organism: Secondary | ICD-10-CM | POA: Diagnosis not present

## 2015-08-06 DIAGNOSIS — J3489 Other specified disorders of nose and nasal sinuses: Secondary | ICD-10-CM | POA: Diagnosis not present

## 2015-08-06 DIAGNOSIS — R05 Cough: Secondary | ICD-10-CM

## 2015-08-06 DIAGNOSIS — J029 Acute pharyngitis, unspecified: Secondary | ICD-10-CM | POA: Diagnosis not present

## 2015-08-06 DIAGNOSIS — R059 Cough, unspecified: Secondary | ICD-10-CM

## 2015-08-06 DIAGNOSIS — R5383 Other fatigue: Secondary | ICD-10-CM

## 2015-08-06 DIAGNOSIS — E119 Type 2 diabetes mellitus without complications: Secondary | ICD-10-CM

## 2015-08-06 MED ORDER — BENZONATATE 100 MG PO CAPS: 100.0000 mg | ORAL_CAPSULE | Freq: Three times a day (TID) | ORAL | Status: DC | PRN

## 2015-08-06 MED ORDER — HYDROCODONE-HOMATROPINE 5-1.5 MG/5ML PO SYRP: 5.0000 mL | ORAL_SOLUTION | Freq: Every evening | ORAL | Status: DC | PRN

## 2015-08-06 MED ORDER — GLIPIZIDE 5 MG PO TABS: 2.5000 mg | ORAL_TABLET | Freq: Every day | ORAL | Status: DC

## 2015-08-06 MED ORDER — AMOXICILLIN 875 MG PO TABS: 875.0000 mg | ORAL_TABLET | Freq: Two times a day (BID) | ORAL | Status: DC

## 2015-08-06 NOTE — Patient Instructions (Addendum)
Because you received an x-ray today, you will receive an invoice from [facility]. Please contact [facility] at [phone] with questions or concerns regarding your invoice. Our billing staff will not be able to assist you with those questions.  Sinusitis, Adult Sinusitis is redness, soreness, and inflammation of the paranasal sinuses. Paranasal sinuses are air pockets within the bones of your face. They are located beneath your eyes, in the middle of your forehead, and above your eyes. In healthy paranasal sinuses, mucus is able to drain out, and air is able to circulate through them by way of your nose. However, when your paranasal sinuses are inflamed, mucus and air can become trapped. This can allow bacteria and other germs to grow and cause infection. Sinusitis can develop quickly and last only a short time (acute) or continue over a long period (chronic). Sinusitis that lasts for more than 12 weeks is considered chronic. CAUSES Causes of sinusitis include:  Allergies.  Structural abnormalities, such as displacement of the cartilage that separates your nostrils (deviated septum), which can decrease the air flow through your nose and sinuses and affect sinus drainage.  Functional abnormalities, such as when the small hairs (cilia) that line your sinuses and help remove mucus do not work properly or are not present. SIGNS AND SYMPTOMS Symptoms of acute and chronic sinusitis are the same. The primary symptoms are pain and pressure around the affected sinuses. Other symptoms include:  Upper toothache.  Earache.  Headache.  Bad breath.  Decreased sense of smell and taste.  A cough, which worsens when you are lying flat.  Fatigue.  Fever.  Thick drainage from your nose, which often is green and may contain pus (purulent).  Swelling and warmth over the affected sinuses. DIAGNOSIS Your health care provider will perform a physical exam. During your exam, your  health care provider may perform any of the following to help determine if you have acute sinusitis or chronic sinusitis:  Look in your nose for signs of abnormal growths in your nostrils (nasal polyps).  Tap over the affected sinus to check for signs of infection.  View the inside of your sinuses using an imaging device that has a light attached (endoscope). If your health care provider suspects that you have chronic sinusitis, one or more of the following tests may be recommended:  Allergy tests.  Nasal culture. A sample of mucus is taken from your nose, sent to a lab, and screened for bacteria.  Nasal cytology. A sample of mucus is taken from your nose and examined by your health care provider to determine if your sinusitis is related to an allergy. TREATMENT Most cases of acute sinusitis are related to a viral infection and will resolve on their own within 10 days. Sometimes, medicines are prescribed to help relieve symptoms of both acute and chronic sinusitis. These may include pain medicines, decongestants, nasal steroid sprays, or saline sprays. However, for sinusitis related to a bacterial infection, your health care provider will prescribe antibiotic medicines. These are medicines that will help kill the bacteria causing the infection. Rarely, sinusitis is caused by a fungal infection. In these cases, your health care provider will prescribe antifungal medicine. For some cases of chronic sinusitis, surgery is needed. Generally, these are cases in which sinusitis recurs more than 3 times per year, despite other treatments. HOME CARE INSTRUCTIONS  Drink plenty of water. Water helps thin the mucus so your sinuses can drain more easily.  Use a humidifier.  Inhale steam 3-4  times a day (for example, sit in the bathroom with the shower running).  Apply a warm, moist washcloth to your face 3-4 times a day, or as directed by your health care provider.  Use saline nasal sprays to help  moisten and clean your sinuses.  Take medicines only as directed by your health care provider.  If you were prescribed either an antibiotic or antifungal medicine, finish it all even if you start to feel better. SEEK IMMEDIATE MEDICAL CARE IF:  You have increasing pain or severe headaches.  You have nausea, vomiting, or drowsiness.  You have swelling around your face.  You have vision problems.  You have a stiff neck.  You have difficulty breathing.   This information is not intended to replace advice given to you by your health care provider. Make sure you discuss any questions you have with your health care provider.   Document Released: 05/20/2005 Document Revised: 06/10/2014 Document Reviewed: 06/04/2011 Elsevier Interactive Patient Education 2016 Elsevier Inc[employer]   Cough, Adult Coughing is a reflex that clears your throat and your airways. Coughing helps to heal and protect your lungs. It is normal to cough occasionally, but a cough that happens with other symptoms or lasts a long time may be a sign of a condition that needs treatment. A cough may last only 2-3 weeks (acute), or it may last longer than 8 weeks (chronic). CAUSES Coughing is commonly caused by:  Breathing in substances that irritate your lungs.  A viral or bacterial respiratory infection.  Allergies.  Asthma.  Postnasal drip.  Smoking.  Acid backing up from the stomach into the esophagus (gastroesophageal reflux).  Certain medicines.  Chronic lung problems, including COPD (or rarely, lung cancer).  Other medical conditions such as heart failure. HOME CARE INSTRUCTIONS  Pay attention to any changes in your symptoms. Take these actions to help with your discomfort:  Take medicines only as told by your health care provider.  If you were prescribed an antibiotic medicine, take it as told by your health care provider. Do not stop taking the antibiotic even if you start to feel better.  Talk with  your health care provider before you take a cough suppressant medicine.  Drink enough fluid to keep your urine clear or pale yellow.  If the air is dry, use a cold steam vaporizer or humidifier in your bedroom or your home to help loosen secretions.  Avoid anything that causes you to cough at work or at home.  If your cough is worse at night, try sleeping in a semi-upright position.  Avoid cigarette smoke. If you smoke, quit smoking. If you need help quitting, ask your health care provider.  Avoid caffeine.  Avoid alcohol.  Rest as needed. SEEK MEDICAL CARE IF:   You have new symptoms.  You cough up pus.  Your cough does not get better after 2-3 weeks, or your cough gets worse.  You cannot control your cough with suppressant medicines and you are losing sleep.  You develop pain that is getting worse or pain that is not controlled with pain medicines.  You have a fever.  You have unexplained weight loss.  You have night sweats. SEEK IMMEDIATE MEDICAL CARE IF:  You cough up blood.  You have difficulty breathing.  Your heartbeat is very fast.   This information is not intended to replace advice given to you by your health care provider. Make sure you discuss any questions you have with your health care provider.  Document Released: 11/16/2010 Document Revised: 02/08/2015 Document Reviewed: 07/27/2014 Elsevier Interactive Patient Education 2016 [employer].    Diabetes Mellitus and Food It is important for you to manage your blood sugar (glucose) level. Your blood glucose level can be greatly affected by what you eat. Eating healthier foods in the appropriate amounts throughout the day at about the same time each day will help you control your blood glucose level. It can also help slow or prevent worsening of your diabetes mellitus. Healthy eating may even help you improve the level of your blood pressure and reach or maintain a healthy weight.  General  recommendations for healthful eating and cooking habits include:  Eating meals and snacks regularly. Avoid going long periods of time without eating to lose weight.  Eating a diet that consists mainly of plant-based foods, such as fruits, vegetables, nuts, legumes, and whole grains.  Using low-heat cooking methods, such as baking, instead of high-heat cooking methods, such as deep frying. Work with your dietitian to make sure you understand how to use the Nutrition Facts information on food labels. HOW CAN FOOD AFFECT ME? Carbohydrates Carbohydrates affect your blood glucose level more than any other type of food. Your dietitian will help you determine how many carbohydrates to eat at each meal and teach you how to count carbohydrates. Counting carbohydrates is important to keep your blood glucose at a healthy level, especially if you are using insulin or taking certain medicines for diabetes mellitus. Alcohol Alcohol can cause sudden decreases in blood glucose (hypoglycemia), especially if you use insulin or take certain medicines for diabetes mellitus. Hypoglycemia can be a life-threatening condition. Symptoms of hypoglycemia (sleepiness, dizziness, and disorientation) are similar to symptoms of having too much alcohol.  If your health care provider has given you approval to drink alcohol, do so in moderation and use the following guidelines:  Women should not have more than one drink per day, and men should not have more than two drinks per day. One drink is equal to:  12 oz of beer.  5 oz of wine.  1 oz of hard liquor.  Do not drink on an empty stomach.  Keep yourself hydrated. Have water, diet soda, or unsweetened iced tea.  Regular soda, juice, and other mixers might contain a lot of carbohydrates and should be counted. WHAT FOODS ARE NOT RECOMMENDED? As you make food choices, it is important to remember that all foods are not the same. Some foods have fewer nutrients per serving  than other foods, even though they might have the same number of calories or carbohydrates. It is difficult to get your body what it needs when you eat foods with fewer nutrients. Examples of foods that you should avoid that are high in calories and carbohydrates but low in nutrients include:  Trans fats (most processed foods list trans fats on the Nutrition Facts label).  Regular soda.  Juice.  Candy.  Sweets, such as cake, pie, doughnuts, and cookies.  Fried foods. WHAT FOODS CAN I EAT? Eat nutrient-rich foods, which will nourish your body and keep you healthy. The food you should eat also will depend on several factors, including:  The calories you need.  The medicines you take.  Your weight.  Your blood glucose level.  Your blood pressure level.  Your cholesterol level. You should eat a variety of foods, including:  Protein.  Lean cuts of meat.  Proteins low in saturated fats, such as fish, egg whites, and beans. Avoid processed meats.  Fruits and vegetables.  Fruits and vegetables that may help control blood glucose levels, such as apples, mangoes, and yams.  Dairy products.  Choose fat-free or low-fat dairy products, such as milk, yogurt, and cheese.  Grains, bread, pasta, and rice.  Choose whole grain products, such as multigrain bread, whole oats, and brown rice. These foods may help control blood pressure.  Fats.  Foods containing healthful fats, such as nuts, avocado, olive oil, canola oil, and fish. DOES EVERYONE WITH DIABETES MELLITUS HAVE THE SAME MEAL PLAN? Because every person with diabetes mellitus is different, there is not one meal plan that works for everyone. It is very important that you meet with a dietitian who will help you create a meal plan that is just right for you.   This information is not intended to replace advice given to you by your health care provider. Make sure you discuss any questions you have with your health care  provider.   Document Released: 02/14/2005 Document Revised: 06/10/2014 Document Reviewed: 04/16/2013 Elsevier Interactive Patient Education [employer].

## 2015-08-06 NOTE — Progress Notes (Signed)
    MRN: [id number] DOB: Jul 31, 1955  Subjective:   [name] is a 60 y.o. female presenting for chief complaint of Hoarse; Nasal Congestion; Fatigue; and Cough  Reports >1 week history of sinus pain, sinus congestion, chest congestion, subjective fever, sore throat, hoarseness, dry cough, cough elicits throat pain, body aches, nausea. Has tried Mucinex, neti pot, nasal saline. Has been trying to stay hydrated, trying to rest, not working for several days. Denies history of allergies, asthma. Denies chest pain, shob, vomiting, abdominal pain.   Diabetes - Reports intermittent polydipsia, tingling of her hands. She previously failed trial of Metformin. She is agreeable to start trial of glipizide. Admits dietary non-compliance but she is exercising. Denies family history of diabetes.   [name] has a current medication list which includes the following prescription(s): cholecalciferol, clonazepam, cyclobenzaprine, diclofenac, docusate calcium, hydrochlorothiazide, multivitamin, omeprazole, potassium chloride sa, and probiotic product. Also is allergic to crestor and metformin and related.  Kathy  has a past medical history of GERD (gastroesophageal reflux disease); Hyperlipidemia; Hyperthyroidism; Benign fundic gland polyps of stomach; IBS (irritable bowel syndrome); Arthritis; Fibromyalgia; Fatty liver; and Esophagitis. Also  has past surgical history that includes Cholecystectomy (2009); Vaginal hysterectomy; and laparoscopy.  Objective:   Vitals: BP 120/76 mmHg  Pulse 102  Temp(Src) 99.3 F (37.4 C) (Oral)  Resp 16  Ht 5\' 8"  (1.727 m)  Wt 237 lb (107.502 kg)  BMI 36.04 kg/m2  SpO2 94%  Physical Exam  Constitutional: She is oriented to person, place, and time. She appears well-developed and well-nourished.  HENT:  TM's intact bilaterally, no effusions or erythema. Nasal turbinates erythematous but nasal passages patent. Bilateral maxillary sinus tenderness. Postnasal drip present,  without oropharyngeal exudates, erythema or abscesses.  Eyes: Right eye exhibits no discharge. Left eye exhibits no discharge. No scleral icterus.  Neck: Normal range of motion. Neck supple.  Cardiovascular: Normal rate, regular rhythm and intact distal pulses.   Pulmonary/Chest: No respiratory distress. She has no wheezes. She has no rales.  Decreased lung sounds which may have been due to poor respiratory effort and cough.  Musculoskeletal: She exhibits no edema.  Lymphadenopathy:    She has no cervical adenopathy.  Neurological: She is alert and oriented to person, place, and time.  Skin: Skin is warm and dry.   Dg Chest 2 View  08/06/2015  CLINICAL DATA:  Cough and fatigue EXAM: CHEST  2 VIEW COMPARISON:  01/16/2007 FINDINGS: Heart size is normal. Mediastinal shadows are normal. There is patchy pneumonia in the left lower lobe. No dense consolidation or lobar collapse. No effusions. Bony structures are unremarkable. IMPRESSION: Patchy pneumonia left lower lobe. Electronically Signed   By: Nelson Chimes M.D.   On: 08/06/2015 11:00   Assessment and Plan :   1. Community Acquired Pneumonia 2. Sinus pain 3. Sore throat 4. Other fatigue 5. Cough - Patient is to start Azithromycin. Use Hycodan and Tessalon for cough. Supportive care otherwise, rtc in 1 week if no improvement.  6. Type 2 diabetes mellitus without complication, without long-term current use of insulin (HCC) - Start 5mg  glipizide. Recheck in 3 months.  Renee Eagles, PA-C [facility] [phone] 08/06/2015 10:26 AM

## 2015-08-07 NOTE — Telephone Encounter (Signed)
Patient was seen again 3/5 with abx given as well as supportives.

## 2015-08-09 LAB — HM DIABETES EYE EXAM

## 2015-10-11 ENCOUNTER — Ambulatory Visit: Payer: BLUE CROSS/BLUE SHIELD | Admitting: Family

## 2015-10-11 DIAGNOSIS — Z0289 Encounter for other administrative examinations: Secondary | ICD-10-CM

## 2015-10-16 ENCOUNTER — Ambulatory Visit (INDEPENDENT_AMBULATORY_CARE_PROVIDER_SITE_OTHER): Payer: BLUE CROSS/BLUE SHIELD | Admitting: Internal Medicine

## 2015-10-16 ENCOUNTER — Other Ambulatory Visit (INDEPENDENT_AMBULATORY_CARE_PROVIDER_SITE_OTHER): Payer: BLUE CROSS/BLUE SHIELD

## 2015-10-16 ENCOUNTER — Encounter: Payer: Self-pay | Admitting: Internal Medicine

## 2015-10-16 VITALS — BP 122/82 | HR 80 | Temp 98.4°F | Resp 16 | Ht 68.0 in | Wt 243.0 lb

## 2015-10-16 DIAGNOSIS — E118 Type 2 diabetes mellitus with unspecified complications: Secondary | ICD-10-CM

## 2015-10-16 DIAGNOSIS — Z0000 Encounter for general adult medical examination without abnormal findings: Secondary | ICD-10-CM

## 2015-10-16 DIAGNOSIS — K5909 Other constipation: Secondary | ICD-10-CM

## 2015-10-16 DIAGNOSIS — Z23 Encounter for immunization: Secondary | ICD-10-CM | POA: Diagnosis not present

## 2015-10-16 DIAGNOSIS — E785 Hyperlipidemia, unspecified: Secondary | ICD-10-CM | POA: Diagnosis not present

## 2015-10-16 DIAGNOSIS — I10 Essential (primary) hypertension: Secondary | ICD-10-CM | POA: Diagnosis not present

## 2015-10-16 DIAGNOSIS — M797 Fibromyalgia: Secondary | ICD-10-CM

## 2015-10-16 DIAGNOSIS — K219 Gastro-esophageal reflux disease without esophagitis: Secondary | ICD-10-CM

## 2015-10-16 DIAGNOSIS — K5900 Constipation, unspecified: Secondary | ICD-10-CM | POA: Insufficient documentation

## 2015-10-16 DIAGNOSIS — G479 Sleep disorder, unspecified: Secondary | ICD-10-CM | POA: Diagnosis not present

## 2015-10-16 LAB — MICROALBUMIN / CREATININE URINE RATIO
Creatinine,U: 140.9 mg/dL
Microalb Creat Ratio: 3.5 mg/g (ref 0.0–30.0)
Microalb, Ur: 5 mg/dL — ABNORMAL HIGH (ref 0.0–1.9)

## 2015-10-16 LAB — CBC WITH DIFFERENTIAL/PLATELET
BASOS PCT: 0.4 % (ref 0.0–3.0)
Basophils Absolute: 0 10*3/uL (ref 0.0–0.1)
EOS PCT: 1.7 % (ref 0.0–5.0)
Eosinophils Absolute: 0.1 10*3/uL (ref 0.0–0.7)
HEMATOCRIT: 40.5 % (ref 36.0–46.0)
HEMOGLOBIN: 14.1 g/dL (ref 12.0–15.0)
Lymphocytes Relative: 53.5 % — ABNORMAL HIGH (ref 12.0–46.0)
Lymphs Abs: 2.8 10*3/uL (ref 0.7–4.0)
MCHC: 34.8 g/dL (ref 30.0–36.0)
MCV: 86.5 fl (ref 78.0–100.0)
MONO ABS: 0.7 10*3/uL (ref 0.1–1.0)
Monocytes Relative: 12.8 % — ABNORMAL HIGH (ref 3.0–12.0)
NEUTROS ABS: 1.6 10*3/uL (ref 1.4–7.7)
NEUTROS PCT: 31.6 % — AB (ref 43.0–77.0)
PLATELETS: 247 10*3/uL (ref 150.0–400.0)
RBC: 4.69 Mil/uL (ref 3.87–5.11)
RDW: 15.6 % — AB (ref 11.5–15.5)
WBC: 5.2 10*3/uL (ref 4.0–10.5)

## 2015-10-16 LAB — URINALYSIS, ROUTINE W REFLEX MICROSCOPIC
BILIRUBIN URINE: NEGATIVE
Ketones, ur: NEGATIVE
Leukocytes, UA: NEGATIVE
NITRITE: NEGATIVE
Specific Gravity, Urine: 1.02 (ref 1.000–1.030)
TOTAL PROTEIN, URINE-UPE24: NEGATIVE
URINE GLUCOSE: NEGATIVE
Urobilinogen, UA: 0.2 (ref 0.0–1.0)
pH: 5.5 (ref 5.0–8.0)

## 2015-10-16 LAB — COMPREHENSIVE METABOLIC PANEL
ALBUMIN: 4.7 g/dL (ref 3.5–5.2)
ALT: 38 U/L — ABNORMAL HIGH (ref 0–35)
AST: 16 U/L (ref 0–37)
Alkaline Phosphatase: 64 U/L (ref 39–117)
BUN: 15 mg/dL (ref 6–23)
CHLORIDE: 99 meq/L (ref 96–112)
CO2: 32 meq/L (ref 19–32)
Calcium: 10.2 mg/dL (ref 8.4–10.5)
Creatinine, Ser: 0.75 mg/dL (ref 0.40–1.20)
GFR: 101.07 mL/min (ref >60.00)
Glucose, Bld: 101 mg/dL — ABNORMAL HIGH (ref 70–99)
POTASSIUM: 3.7 meq/L (ref 3.5–5.1)
SODIUM: 140 meq/L (ref 135–145)
Total Bilirubin: 0.4 mg/dL (ref 0.2–1.2)
Total Protein: 8.1 g/dL (ref 6.0–8.3)

## 2015-10-16 LAB — LIPID PANEL
CHOL/HDL RATIO: 6
CHOLESTEROL: 265 mg/dL — AB (ref 0–200)
HDL: 42.4 mg/dL (ref >39.00)
NONHDL: 222.85
Triglycerides: 275 mg/dL — ABNORMAL HIGH (ref 0.0–149.0)
VLDL: 55 mg/dL — AB (ref 0.0–40.0)

## 2015-10-16 LAB — T4, FREE: FREE T4: 0.75 ng/dL (ref 0.60–1.60)

## 2015-10-16 LAB — TSH: TSH: 0.41 u[IU]/mL (ref 0.35–4.50)

## 2015-10-16 LAB — MAGNESIUM: MAGNESIUM: 1.7 mg/dL (ref 1.5–2.5)

## 2015-10-16 LAB — LDL CHOLESTEROL, DIRECT: LDL DIRECT: 179 mg/dL

## 2015-10-16 LAB — HEMOGLOBIN A1C: HEMOGLOBIN A1C: 6.2 % (ref 4.6–6.5)

## 2015-10-16 MED ORDER — CLONAZEPAM 0.5 MG PO TABS: ORAL_TABLET | ORAL | Status: DC

## 2015-10-16 MED ORDER — LUBIPROSTONE 24 MCG PO CAPS: 24.0000 ug | ORAL_CAPSULE | Freq: Two times a day (BID) | ORAL | Status: DC

## 2015-10-16 MED ORDER — OMEPRAZOLE 20 MG PO CPDR: DELAYED_RELEASE_CAPSULE | ORAL | Status: DC

## 2015-10-16 MED ORDER — EXENATIDE ER 2 MG ~~LOC~~ PEN: 1.0000 | PEN_INJECTOR | SUBCUTANEOUS | Status: DC

## 2015-10-16 MED ORDER — POTASSIUM CHLORIDE CRYS ER 20 MEQ PO TBCR
20.0000 meq | EXTENDED_RELEASE_TABLET | Freq: Two times a day (BID) | ORAL | Status: DC
Start: 2015-10-16 — End: 2016-10-29

## 2015-10-16 MED ORDER — PREGABALIN 75 MG PO CAPS: 75.0000 mg | ORAL_CAPSULE | Freq: Every day | ORAL | Status: DC

## 2015-10-16 NOTE — Patient Instructions (Signed)
Preventive Care for Adults, Female A healthy lifestyle and preventive care can promote health and wellness. Preventive health guidelines for women include the following key practices.  A routine yearly physical is a good way to check with your health care provider about your health and preventive screening. It is a chance to share any concerns and updates on your health and to receive a thorough exam.  Visit your dentist for a routine exam and preventive care every 6 months. Brush your teeth twice a day and floss once a day. Good oral hygiene prevents tooth decay and gum disease.  The frequency of eye exams is based on your age, health, family medical history, use of contact lenses, and other factors. Follow your health care provider's recommendations for frequency of eye exams.  Eat a healthy diet. Foods like vegetables, fruits, whole grains, low-fat dairy products, and lean protein foods contain the nutrients you need without too many calories. Decrease your intake of foods high in solid fats, added sugars, and salt. Eat the right amount of calories for you.Get information about a proper diet from your health care provider, if necessary.  Regular physical exercise is one of the most important things you can do for your health. Most adults should get at least 150 minutes of moderate-intensity exercise (any activity that increases your heart rate and causes you to sweat) each week. In addition, most adults need muscle-strengthening exercises on 2 or more days a week.  Maintain a healthy weight. The body mass index (BMI) is a screening tool to identify possible weight problems. It provides an estimate of body fat based on height and weight. Your health care provider can find your BMI and can help you achieve or maintain a healthy weight.For adults 20 years and older:  A BMI below 18.5 is considered underweight.  A BMI of 18.5 to 24.9 is normal.  A BMI of 25 to 29.9 is considered overweight.  A  BMI of 30 and above is considered obese.  Maintain normal blood lipids and cholesterol levels by exercising and minimizing your intake of saturated fat. Eat a balanced diet with plenty of fruit and vegetables. Blood tests for lipids and cholesterol should begin at age 45 and be repeated every 5 years. If your lipid or cholesterol levels are high, you are over 50, or you are at high risk for heart disease, you may need your cholesterol levels checked more frequently.Ongoing high lipid and cholesterol levels should be treated with medicines if diet and exercise are not working.  If you smoke, find out from your health care provider how to quit. If you do not use tobacco, do not start.  Lung cancer screening is recommended for adults aged 45-80 years who are at high risk for developing lung cancer because of a history of smoking. A yearly low-dose CT scan of the lungs is recommended for people who have at least a 30-pack-year history of smoking and are a current smoker or have quit within the past 15 years. A pack year of smoking is smoking an average of 1 pack of cigarettes a day for 1 year (for example: 1 pack a day for 30 years or 2 packs a day for 15 years). Yearly screening should continue until the smoker has stopped smoking for at least 15 years. Yearly screening should be stopped for people who develop a health problem that would prevent them from having lung cancer treatment.  If you are pregnant, do not drink alcohol. If you are  breastfeeding, be very cautious about drinking alcohol. If you are not pregnant and choose to drink alcohol, do not have more than 1 drink per day. One drink is considered to be 12 ounces (355 mL) of beer, 5 ounces (148 mL) of wine, or 1.5 ounces (44 mL) of liquor.  Avoid use of street drugs. Do not share needles with anyone. Ask for help if you need support or instructions about stopping the use of drugs.  High blood pressure causes heart disease and increases the risk  of stroke. Your blood pressure should be checked at least every 1 to 2 years. Ongoing high blood pressure should be treated with medicines if weight loss and exercise do not work.  If you are 55-79 years old, ask your health care provider if you should take aspirin to prevent strokes.  Diabetes screening is done by taking a blood sample to check your blood glucose level after you have not eaten for a certain period of time (fasting). If you are not overweight and you do not have risk factors for diabetes, you should be screened once every 3 years starting at age 45. If you are overweight or obese and you are 40-70 years of age, you should be screened for diabetes every year as part of your cardiovascular risk assessment.  Breast cancer screening is essential preventive care for women. You should practice "breast self-awareness." This means understanding the normal appearance and feel of your breasts and may include breast self-examination. Any changes detected, no matter how small, should be reported to a health care provider. Women in their 20s and 30s should have a clinical breast exam (CBE) by a health care provider as part of a regular health exam every 1 to 3 years. After age 40, women should have a CBE every year. Starting at age 40, women should consider having a mammogram (breast X-ray test) every year. Women who have a family history of breast cancer should talk to their health care provider about genetic screening. Women at a high risk of breast cancer should talk to their health care providers about having an MRI and a mammogram every year.  Breast cancer gene (BRCA)-related cancer risk assessment is recommended for women who have family members with BRCA-related cancers. BRCA-related cancers include breast, ovarian, tubal, and peritoneal cancers. Having family members with these cancers may be associated with an increased risk for harmful changes (mutations) in the breast cancer genes BRCA1 and  BRCA2. Results of the assessment will determine the need for genetic counseling and BRCA1 and BRCA2 testing.  Your health care provider may recommend that you be screened regularly for cancer of the pelvic organs (ovaries, uterus, and vagina). This screening involves a pelvic examination, including checking for microscopic changes to the surface of your cervix (Pap test). You may be encouraged to have this screening done every 3 years, beginning at age 21.  For women ages 30-65, health care providers may recommend pelvic exams and Pap testing every 3 years, or they may recommend the Pap and pelvic exam, combined with testing for human papilloma virus (HPV), every 5 years. Some types of HPV increase your risk of cervical cancer. Testing for HPV may also be done on women of any age with unclear Pap test results.  Other health care providers may not recommend any screening for nonpregnant women who are considered low risk for pelvic cancer and who do not have symptoms. Ask your health care provider if a screening pelvic exam is right for   you.  If you have had past treatment for cervical cancer or a condition that could lead to cancer, you need Pap tests and screening for cancer for at least 20 years after your treatment. If Pap tests have been discontinued, your risk factors (such as having a new sexual partner) need to be reassessed to determine if screening should resume. Some women have medical problems that increase the chance of getting cervical cancer. In these cases, your health care provider may recommend more frequent screening and Pap tests.  Colorectal cancer can be detected and often prevented. Most routine colorectal cancer screening begins at the age of 50 years and continues through age 75 years. However, your health care provider may recommend screening at an earlier age if you have risk factors for colon cancer. On a yearly basis, your health care provider may provide home test kits to check  for hidden blood in the stool. Use of a small camera at the end of a tube, to directly examine the colon (sigmoidoscopy or colonoscopy), can detect the earliest forms of colorectal cancer. Talk to your health care provider about this at age 50, when routine screening begins. Direct exam of the colon should be repeated every 5-10 years through age 75 years, unless early forms of precancerous polyps or small growths are found.  People who are at an increased risk for hepatitis B should be screened for this virus. You are considered at high risk for hepatitis B if:  You were born in a country where hepatitis B occurs often. Talk with your health care provider about which countries are considered high risk.  Your parents were born in a high-risk country and you have not received a shot to protect against hepatitis B (hepatitis B vaccine).  You have HIV or AIDS.  You use needles to inject street drugs.  You live with, or have sex with, someone who has hepatitis B.  You get hemodialysis treatment.  You take certain medicines for conditions like cancer, organ transplantation, and autoimmune conditions.  Hepatitis C blood testing is recommended for all people born from 1945 through 1965 and any individual with known risks for hepatitis C.  Practice safe sex. Use condoms and avoid high-risk sexual practices to reduce the spread of sexually transmitted infections (STIs). STIs include gonorrhea, chlamydia, syphilis, trichomonas, herpes, HPV, and human immunodeficiency virus (HIV). Herpes, HIV, and HPV are viral illnesses that have no cure. They can result in disability, cancer, and death.  You should be screened for sexually transmitted illnesses (STIs) including gonorrhea and chlamydia if:  You are sexually active and are younger than 24 years.  You are older than 24 years and your health care provider tells you that you are at risk for this type of infection.  Your sexual activity has changed  since you were last screened and you are at an increased risk for chlamydia or gonorrhea. Ask your health care provider if you are at risk.  If you are at risk of being infected with HIV, it is recommended that you take a prescription medicine daily to prevent HIV infection. This is called preexposure prophylaxis (PrEP). You are considered at risk if:  You are sexually active and do not regularly use condoms or know the HIV status of your partner(s).  You take drugs by injection.  You are sexually active with a partner who has HIV.  Talk with your health care provider about whether you are at high risk of being infected with HIV. If   you choose to begin PrEP, you should first be tested for HIV. You should then be tested every 3 months for as long as you are taking PrEP.  Osteoporosis is a disease in which the bones lose minerals and strength with aging. This can result in serious bone fractures or breaks. The risk of osteoporosis can be identified using a bone density scan. Women ages 67 years and over and women at risk for fractures or osteoporosis should discuss screening with their health care providers. Ask your health care provider whether you should take a calcium supplement or vitamin D to reduce the rate of osteoporosis.  Menopause can be associated with physical symptoms and risks. Hormone replacement therapy is available to decrease symptoms and risks. You should talk to your health care provider about whether hormone replacement therapy is right for you.  Use sunscreen. Apply sunscreen liberally and repeatedly throughout the day. You should seek shade when your shadow is shorter than you. Protect yourself by wearing long sleeves, pants, a wide-brimmed hat, and sunglasses year round, whenever you are outdoors.  Once a month, do a whole body skin exam, using a mirror to look at the skin on your back. Tell your health care provider of new moles, moles that have irregular borders, moles that  are larger than a pencil eraser, or moles that have changed in shape or color.  Stay current with required vaccines (immunizations).  Influenza vaccine. All adults should be immunized every year.  Tetanus, diphtheria, and acellular pertussis (Td, Tdap) vaccine. Pregnant women should receive 1 dose of Tdap vaccine during each pregnancy. The dose should be obtained regardless of the length of time since the last dose. Immunization is preferred during the 27th-36th week of gestation. An adult who has not previously received Tdap or who does not know her vaccine status should receive 1 dose of Tdap. This initial dose should be followed by tetanus and diphtheria toxoids (Td) booster doses every 10 years. Adults with an unknown or incomplete history of completing a 3-dose immunization series with Td-containing vaccines should begin or complete a primary immunization series including a Tdap dose. Adults should receive a Td booster every 10 years.  Varicella vaccine. An adult without evidence of immunity to varicella should receive 2 doses or a second dose if she has previously received 1 dose. Pregnant females who do not have evidence of immunity should receive the first dose after pregnancy. This first dose should be obtained before leaving the health care facility. The second dose should be obtained 4-8 weeks after the first dose.  Human papillomavirus (HPV) vaccine. Females aged 13-26 years who have not received the vaccine previously should obtain the 3-dose series. The vaccine is not recommended for use in pregnant females. However, pregnancy testing is not needed before receiving a dose. If a female is found to be pregnant after receiving a dose, no treatment is needed. In that case, the remaining doses should be delayed until after the pregnancy. Immunization is recommended for any person with an immunocompromised condition through the age of 61 years if she did not get any or all doses earlier. During the  3-dose series, the second dose should be obtained 4-8 weeks after the first dose. The third dose should be obtained 24 weeks after the first dose and 16 weeks after the second dose.  Zoster vaccine. One dose is recommended for adults aged 30 years or older unless certain conditions are present.  Measles, mumps, and rubella (MMR) vaccine. Adults born  before 1957 generally are considered immune to measles and mumps. Adults born in 1957 or later should have 1 or more doses of MMR vaccine unless there is a contraindication to the vaccine or there is laboratory evidence of immunity to each of the three diseases. A routine second dose of MMR vaccine should be obtained at least 28 days after the first dose for students attending postsecondary schools, health care workers, or international travelers. People who received inactivated measles vaccine or an unknown type of measles vaccine during 1963-1967 should receive 2 doses of MMR vaccine. People who received inactivated mumps vaccine or an unknown type of mumps vaccine before 1979 and are at high risk for mumps infection should consider immunization with 2 doses of MMR vaccine. For females of childbearing age, rubella immunity should be determined. If there is no evidence of immunity, females who are not pregnant should be vaccinated. If there is no evidence of immunity, females who are pregnant should delay immunization until after pregnancy. Unvaccinated health care workers born before 1957 who lack laboratory evidence of measles, mumps, or rubella immunity or laboratory confirmation of disease should consider measles and mumps immunization with 2 doses of MMR vaccine or rubella immunization with 1 dose of MMR vaccine.  Pneumococcal 13-valent conjugate (PCV13) vaccine. When indicated, a person who is uncertain of his immunization history and has no record of immunization should receive the PCV13 vaccine. All adults 65 years of age and older should receive this  vaccine. An adult aged 19 years or older who has certain medical conditions and has not been previously immunized should receive 1 dose of PCV13 vaccine. This PCV13 should be followed with a dose of pneumococcal polysaccharide (PPSV23) vaccine. Adults who are at high risk for pneumococcal disease should obtain the PPSV23 vaccine at least 8 weeks after the dose of PCV13 vaccine. Adults older than 61 years of age who have normal immune system function should obtain the PPSV23 vaccine dose at least 1 year after the dose of PCV13 vaccine.  Pneumococcal polysaccharide (PPSV23) vaccine. When PCV13 is also indicated, PCV13 should be obtained first. All adults aged 65 years and older should be immunized. An adult younger than age 65 years who has certain medical conditions should be immunized. Any person who resides in a nursing home or long-term care facility should be immunized. An adult smoker should be immunized. People with an immunocompromised condition and certain other conditions should receive both PCV13 and PPSV23 vaccines. People with human immunodeficiency virus (HIV) infection should be immunized as soon as possible after diagnosis. Immunization during chemotherapy or radiation therapy should be avoided. Routine use of PPSV23 vaccine is not recommended for American Indians, [place] Natives, or people younger than 65 years unless there are medical conditions that require PPSV23 vaccine. When indicated, people who have unknown immunization and have no record of immunization should receive PPSV23 vaccine. One-time revaccination 5 years after the first dose of PPSV23 is recommended for people aged 19-64 years who have chronic kidney failure, nephrotic syndrome, asplenia, or immunocompromised conditions. People who received 1-2 doses of PPSV23 before age 65 years should receive another dose of PPSV23 vaccine at age 65 years or later if at least 5 years have passed since the previous dose. Doses of PPSV23 are not  needed for people immunized with PPSV23 at or after age 65 years.  Meningococcal vaccine. Adults with asplenia or persistent complement component deficiencies should receive 2 doses of quadrivalent meningococcal conjugate (MenACWY-D) vaccine. The doses should be obtained   at least 2 months apart. Microbiologists working with certain meningococcal bacteria, [place] recruits, people at risk during an outbreak, and people who travel to or live in countries with a high rate of meningitis should be immunized. A first-year college student up through age 34 years who is living in a residence hall should receive a dose if she did not receive a dose on or after her 16th birthday. Adults who have certain high-risk conditions should receive one or more doses of vaccine.  Hepatitis A vaccine. Adults who wish to be protected from this disease, have certain high-risk conditions, work with hepatitis A-infected animals, work in hepatitis A research labs, or travel to or work in countries with a high rate of hepatitis A should be immunized. Adults who were previously unvaccinated and who anticipate close contact with an international adoptee during the first 60 days after arrival in the [place] from a country with a high rate of hepatitis A should be immunized.  Hepatitis B vaccine. Adults who wish to be protected from this disease, have certain high-risk conditions, may be exposed to blood or other infectious body fluids, are household contacts or sex partners of hepatitis B positive people, are clients or workers in certain care facilities, or travel to or work in countries with a high rate of hepatitis B should be immunized.  Haemophilus influenzae type b (Hib) vaccine. A previously unvaccinated person with asplenia or sickle cell disease or having a scheduled splenectomy should receive 1 dose of Hib vaccine. Regardless of previous immunization, a recipient of a hematopoietic stem cell transplant should receive a  3-dose series 6-12 months after her successful transplant. Hib vaccine is not recommended for adults with HIV infection. Preventive Services / Frequency Ages 35 to 4 years  Blood pressure check.** / Every 3-5 years.  Lipid and cholesterol check.** / Every 5 years beginning at age 60.  Clinical breast exam.** / Every 3 years for women in their 71s and 10s.  BRCA-related cancer risk assessment.** / For women who have family members with a BRCA-related cancer (breast, ovarian, tubal, or peritoneal cancers).  Pap test.** / Every 2 years from ages 76 through 26. Every 3 years starting at age 61 through age 76 or 93 with a history of 3 consecutive normal Pap tests.  HPV screening.** / Every 3 years from ages 37 through ages 60 to 51 with a history of 3 consecutive normal Pap tests.  Hepatitis C blood test.** / For any individual with known risks for hepatitis C.  Skin self-exam. / Monthly.  Influenza vaccine. / Every year.  Tetanus, diphtheria, and acellular pertussis (Tdap, Td) vaccine.** / Consult your health care provider. Pregnant women should receive 1 dose of Tdap vaccine during each pregnancy. 1 dose of Td every 10 years.  Varicella vaccine.** / Consult your health care provider. Pregnant females who do not have evidence of immunity should receive the first dose after pregnancy.  HPV vaccine. / 3 doses over 6 months, if 93 and younger. The vaccine is not recommended for use in pregnant females. However, pregnancy testing is not needed before receiving a dose.  Measles, mumps, rubella (MMR) vaccine.** / You need at least 1 dose of MMR if you were born in 1957 or later. You may also need a 2nd dose. For females of childbearing age, rubella immunity should be determined. If there is no evidence of immunity, females who are not pregnant should be vaccinated. If there is no evidence of immunity, females who are  pregnant should delay immunization until after pregnancy.  Pneumococcal  13-valent conjugate (PCV13) vaccine.** / Consult your health care provider.  Pneumococcal polysaccharide (PPSV23) vaccine.** / 1 to 2 doses if you smoke cigarettes or if you have certain conditions.  Meningococcal vaccine.** / 1 dose if you are age 68 to 8 years and a Market researcher living in a residence hall, or have one of several medical conditions, you need to get vaccinated against meningococcal disease. You may also need additional booster doses.  Hepatitis A vaccine.** / Consult your health care provider.  Hepatitis B vaccine.** / Consult your health care provider.  Haemophilus influenzae type b (Hib) vaccine.** / Consult your health care provider. Ages 7 to 53 years  Blood pressure check.** / Every year.  Lipid and cholesterol check.** / Every 5 years beginning at age 25 years.  Lung cancer screening. / Every year if you are aged 11-80 years and have a 30-pack-year history of smoking and currently smoke or have quit within the past 15 years. Yearly screening is stopped once you have quit smoking for at least 15 years or develop a health problem that would prevent you from having lung cancer treatment.  Clinical breast exam.** / Every year after age 48 years.  BRCA-related cancer risk assessment.** / For women who have family members with a BRCA-related cancer (breast, ovarian, tubal, or peritoneal cancers).  Mammogram.** / Every year beginning at age 41 years and continuing for as long as you are in good health. Consult with your health care provider.  Pap test.** / Every 3 years starting at age 65 years through age 37 or 70 years with a history of 3 consecutive normal Pap tests.  HPV screening.** / Every 3 years from ages 72 years through ages 60 to 40 years with a history of 3 consecutive normal Pap tests.  Fecal occult blood test (FOBT) of stool. / Every year beginning at age 21 years and continuing until age 5 years. You may not need to do this test if you get  a colonoscopy every 10 years.  Flexible sigmoidoscopy or colonoscopy.** / Every 5 years for a flexible sigmoidoscopy or every 10 years for a colonoscopy beginning at age 35 years and continuing until age 48 years.  Hepatitis C blood test.** / For all people born from [birth year] through 1965 and any individual with known risks for hepatitis C.  Skin self-exam. / Monthly.  Influenza vaccine. / Every year.  Tetanus, diphtheria, and acellular pertussis (Tdap/Td) vaccine.** / Consult your health care provider. Pregnant women should receive 1 dose of Tdap vaccine during each pregnancy. 1 dose of Td every 10 years.  Varicella vaccine.** / Consult your health care provider. Pregnant females who do not have evidence of immunity should receive the first dose after pregnancy.  Zoster vaccine.** / 1 dose for adults aged 30 years or older.  Measles, mumps, rubella (MMR) vaccine.** / You need at least 1 dose of MMR if you were born in 1957 or later. You may also need a second dose. For females of childbearing age, rubella immunity should be determined. If there is no evidence of immunity, females who are not pregnant should be vaccinated. If there is no evidence of immunity, females who are pregnant should delay immunization until after pregnancy.  Pneumococcal 13-valent conjugate (PCV13) vaccine.** / Consult your health care provider.  Pneumococcal polysaccharide (PPSV23) vaccine.** / 1 to 2 doses if you smoke cigarettes or if you have certain conditions.  Meningococcal vaccine.** /  Consult your health care provider.  Hepatitis A vaccine.** / Consult your health care provider.  Hepatitis B vaccine.** / Consult your health care provider.  Haemophilus influenzae type b (Hib) vaccine.** / Consult your health care provider. Ages 64 years and over  Blood pressure check.** / Every year.  Lipid and cholesterol check.** / Every 5 years beginning at age 23 years.  Lung cancer screening. / Every year if you  are aged 16-80 years and have a 30-pack-year history of smoking and currently smoke or have quit within the past 15 years. Yearly screening is stopped once you have quit smoking for at least 15 years or develop a health problem that would prevent you from having lung cancer treatment.  Clinical breast exam.** / Every year after age 74 years.  BRCA-related cancer risk assessment.** / For women who have family members with a BRCA-related cancer (breast, ovarian, tubal, or peritoneal cancers).  Mammogram.** / Every year beginning at age 44 years and continuing for as long as you are in good health. Consult with your health care provider.  Pap test.** / Every 3 years starting at age 58 years through age 22 or 39 years with 3 consecutive normal Pap tests. Testing can be stopped between 65 and 70 years with 3 consecutive normal Pap tests and no abnormal Pap or HPV tests in the past 10 years.  HPV screening.** / Every 3 years from ages 64 years through ages 70 or 61 years with a history of 3 consecutive normal Pap tests. Testing can be stopped between 65 and 70 years with 3 consecutive normal Pap tests and no abnormal Pap or HPV tests in the past 10 years.  Fecal occult blood test (FOBT) of stool. / Every year beginning at age 40 years and continuing until age 27 years. You may not need to do this test if you get a colonoscopy every 10 years.  Flexible sigmoidoscopy or colonoscopy.** / Every 5 years for a flexible sigmoidoscopy or every 10 years for a colonoscopy beginning at age 7 years and continuing until age 32 years.  Hepatitis C blood test.** / For all people born from [birth year] through 1966 and any individual with known risks for hepatitis C.  Osteoporosis screening.** / A one-time screening for women ages 30 years and over and women at risk for fractures or osteoporosis.  Skin self-exam. / Monthly.  Influenza vaccine. / Every year.  Tetanus, diphtheria, and acellular pertussis (Tdap/Td)  vaccine.** / 1 dose of Td every 10 years.  Varicella vaccine.** / Consult your health care provider.  Zoster vaccine.** / 1 dose for adults aged 35 years or older.  Pneumococcal 13-valent conjugate (PCV13) vaccine.** / Consult your health care provider.  Pneumococcal polysaccharide (PPSV23) vaccine.** / 1 dose for all adults aged 46 years and older.  Meningococcal vaccine.** / Consult your health care provider.  Hepatitis A vaccine.** / Consult your health care provider.  Hepatitis B vaccine.** / Consult your health care provider.  Haemophilus influenzae type b (Hib) vaccine.** / Consult your health care provider. ** Family history and personal history of risk and conditions may change your health care provider's recommendations.   This information is not intended to replace advice given to you by your health care provider. Make sure you discuss any questions you have with your health care provider.   Document Released: 07/16/2001 Document Revised: 06/10/2014 Document Reviewed: 10/15/2010 Elsevier Interactive Patient Education [employer].

## 2015-10-16 NOTE — Progress Notes (Signed)
Subjective:  Patient ID: [name], female    DOB: 1955-06-29  Age: 60 y.o. MRN: [id number]  CC: Annual Exam; Hypertension; Hyperlipidemia; and Diabetes   HPI KAROLINA OCEAN presents for a CPX - She saw her gynecologist 9 months ago.  She recently saw another provider for management of diabetes and was placed on a sulfonylurea. She does not want to take this medication anymore because she thinks it makes her have an increased appetite and has caused weight gain.  She also complains of diffuse musculoskeletal pain at night that keeps her awake. She has a history of fibromyalgia and was recently prescribed Flexeril but she says it does not help. She tried Lyrica years ago and doesn't recall what her response was to it. She also complains of insomnia and requests a refill on Klonopin to treat the insomnia. She has difficulty falling asleep and frequent awakenings.  She also complains of chronic constipation with straining. She's had this for many years and has had to use Senokot, milk of magnesia, and other stimulants, with inconsistent response.  Outpatient Prescriptions Prior to Visit  Medication Sig Dispense Refill  . Cholecalciferol (VITAMIN D PO) Take 3 tablets by mouth daily.    . diclofenac (VOLTAREN) 75 MG EC tablet Take 75 mg by mouth as needed. Reported on 07/26/2015  12  . Docusate Calcium (STOOL SOFTENER PO) Take 1 tablet by mouth daily.    . hydrochlorothiazide (HYDRODIURIL) 25 MG tablet TAKE 1 TABLET BY MOUTH DAILY. 90 tablet 0  . Multiple Vitamin (MULTIVITAMIN) tablet Take 1 tablet by mouth daily.    . Probiotic Product (PROBIOTIC PO) 1 tablet daily.     . cyclobenzaprine (FLEXERIL) 5 MG tablet Take 1-2 tablets (5-10 mg total) by mouth 3 (three) times daily as needed for muscle spasms. 60 tablet 0  . glipiZIDE (GLUCOTROL) 5 MG tablet Take 0.5-1 tablets (2.5-5 mg total) by mouth daily before breakfast. 90 tablet 1  . omeprazole (PRILOSEC) 20 MG capsule TAKE 1 CAPSULE BY  MOUTH 2 (TWO) TIMES DAILY BEFORE A MEAL. 60 capsule 3  . potassium chloride SA (KLOR-CON M20) 20 MEQ tablet Take 1 tablet (20 mEq total) by mouth 2 (two) times daily. 180 tablet 1  . benzonatate (TESSALON) 100 MG capsule Take 1-2 capsules (100-200 mg total) by mouth 3 (three) times daily as needed for cough. 40 capsule 0  . clonazePAM (KLONOPIN) 0.5 MG tablet 1 each night if you awaken in place of Zolpidem (Patient not taking: Reported on 10/16/2015) 30 tablet 0  . HYDROcodone-homatropine (HYCODAN) 5-1.5 MG/5ML syrup Take 5 mLs by mouth at bedtime as needed. 120 mL 0   No facility-administered medications prior to visit.    ROS Review of Systems  Constitutional: Positive for unexpected weight change (wt gain). Negative for fever, chills, diaphoresis, activity change, appetite change and fatigue.  HENT: Negative.  Negative for sinus pressure and trouble swallowing.   Eyes: Negative.  Negative for visual disturbance.  Respiratory: Negative.  Negative for cough, choking, chest tightness, shortness of breath and stridor.   Cardiovascular: Negative.  Negative for chest pain, palpitations and leg swelling.  Gastrointestinal: Positive for constipation. Negative for nausea, vomiting, abdominal pain and diarrhea.  Endocrine: Negative.  Negative for polydipsia, polyphagia and polyuria.  Genitourinary: Negative.   Musculoskeletal: Positive for myalgias. Negative for back pain, joint swelling, arthralgias, gait problem, neck pain and neck stiffness.  Skin: Negative.  Negative for color change, pallor and rash.  Allergic/Immunologic: Negative.   Neurological:  Negative.  Negative for dizziness, tremors, weakness and headaches.  Hematological: Negative.  Negative for adenopathy. Does not bruise/bleed easily.  Psychiatric/Behavioral: Positive for sleep disturbance. Negative for suicidal ideas, hallucinations, self-injury, dysphoric mood and decreased concentration. The patient is nervous/anxious.      Objective:  BP 122/82 mmHg  Pulse 80  Temp(Src) 98.4 F (36.9 C) (Oral)  Resp 16  Ht 5\' 8"  (1.727 m)  Wt 243 lb (110.224 kg)  BMI 36.96 kg/m2  SpO2 98%  BP Readings from Last 3 Encounters:  10/16/15 122/82  08/06/15 120/76  07/26/15 128/86    Wt Readings from Last 3 Encounters:  10/16/15 243 lb (110.224 kg)  08/06/15 237 lb (107.502 kg)  07/26/15 242 lb (109.77 kg)    Physical Exam  Constitutional: She is oriented to person, place, and time. No distress.  HENT:  Head: Normocephalic and atraumatic.  Mouth/Throat: Oropharynx is clear and moist. No oropharyngeal exudate.  Eyes: Conjunctivae are normal. Right eye exhibits no discharge. Left eye exhibits no discharge. No scleral icterus.  Neck: Normal range of motion. Neck supple. No JVD present. No tracheal deviation present. No thyromegaly present.  Cardiovascular: Normal rate, regular rhythm, normal heart sounds and intact distal pulses.  Exam reveals no gallop and no friction rub.   No murmur heard. Pulmonary/Chest: Effort normal and breath sounds normal. No stridor. No respiratory distress. She has no wheezes. She has no rales. She exhibits no tenderness.  Abdominal: Soft. Bowel sounds are normal. She exhibits no distension and no mass. There is no tenderness. There is no rebound and no guarding.  Musculoskeletal: Normal range of motion. She exhibits no edema or tenderness.  Lymphadenopathy:    She has no cervical adenopathy.  Neurological: She is oriented to person, place, and time.  Skin: Skin is warm and dry. No rash noted. She is not diaphoretic. No erythema. No pallor.  Psychiatric: She has a normal mood and affect. Her behavior is normal. Judgment and thought content normal.  Vitals reviewed.   Lab Results  Component Value Date   WBC 5.2 10/16/2015   HGB 14.1 10/16/2015   HCT 40.5 10/16/2015   PLT 247.0 10/16/2015   GLUCOSE 101* 10/16/2015   CHOL 265* 10/16/2015   TRIG 275.0* 10/16/2015   HDL 42.40  10/16/2015   LDLDIRECT 179.0 10/16/2015   LDLCALC 172 01/07/2013   ALT 38* 10/16/2015   AST 16 10/16/2015   NA 140 10/16/2015   K 3.7 10/16/2015   CL 99 10/16/2015   CREATININE 0.75 10/16/2015   BUN 15 10/16/2015   CO2 32 10/16/2015   TSH 0.41 10/16/2015   HGBA1C 6.2 10/16/2015   MICROALBUR 5.0* 10/16/2015    Nm Gastric Emptying  09/09/2014  CLINICAL DATA:  Bloating, gastroesophageal reflux, gas EXAM: NUCLEAR MEDICINE GASTRIC EMPTYING SCAN TECHNIQUE: After oral ingestion of radiolabeled meal, sequential abdominal images were obtained for 4 hours. Percentage of activity emptying the stomach calculated at 1 hour, 2 hour, 3 hour, and 4 hours. RADIOPHARMACEUTICALS:  2 mCi Technetium 99-m labeled sulfur colloid COMPARISON:  05/19/2012 FINDINGS: Expected location of the stomach in the left upper quadrant. Ingested meal empties the stomach gradually over the course of the study. 20% emptied at 1 hr ( normal >= 10%) 87% emptied at 2 hr ( normal >= 40%) 96% emptied at 3 hr ( normal >= 70%) IMPRESSION: Normal gastric emptying study. Electronically Signed   By: Kathreen Devoid   On: 09/09/2014 13:04    Assessment & Plan:   [name]  was seen today for annual exam, hypertension, hyperlipidemia and diabetes.  Diagnoses and all orders for this visit:  Hyperlipidemia with target LDL less than 100- Her Framingham risk score is only 5% so at this time I do not recommend that she start a statin.  Controlled type 2 diabetes mellitus with complication, without long-term current use of insulin ([place])- she has not tolerated the sulfonylurea so I recommended that she switch to a GLP-1 agonist, the A1c shows that her blood sugars have been well controlled. -     Exenatide ER (BYDUREON) 2 MG PEN; Inject 1 Act into the skin once a week. -     Hemoglobin A1c; Future -     Microalbumin / creatinine urine ratio; Future  HYPERTENSION, BENIGN ESSENTIAL- her blood pressures adequately well-controlled, electrolytes and renal  function are stable. I do not see any secondary causes for hypertension. -     potassium chloride SA (KLOR-CON M20) 20 MEQ tablet; Take 1 tablet (20 mEq total) by mouth 2 (two) times daily. -     Urinalysis, Routine w reflex microscopic (not at [facility]); Future  Sleep disorder -     clonazePAM (KLONOPIN) 0.5 MG tablet; 1 each night if you awaken in place of Zolpidem -     pregabalin (LYRICA) 75 MG capsule; Take 1 capsule (75 mg total) by mouth at bedtime.  Fibromyalgia muscle pain -     pregabalin (LYRICA) 75 MG capsule; Take 1 capsule (75 mg total) by mouth at bedtime.  Routine general medical examination at a health care facility- exam completed, labs ordered and reviewed, her Pap smear/mammogram/colonoscopy are all up-to-date, vaccines reviewed and updated, she was given patient education material. -     Lipid panel; Future -     Comprehensive metabolic panel; Future -     CBC with Differential/Platelet; Future -     TSH; Future  Gastroesophageal reflux disease without esophagitis -     omeprazole (PRILOSEC) 20 MG capsule; TAKE 1 CAPSULE BY MOUTH 2 (TWO) TIMES DAILY BEFORE A MEAL.  Other constipation- her lab work does not indicate any secondary or metabolic causes for constipation, she is not on any medications are associated with constipation. This is consistent with chronic idiopathic constipation so I have asked her to start taking Amitiza. -     Magnesium; Future -     T4, free; Future -     lubiprostone (AMITIZA) 24 MCG capsule; Take 1 capsule (24 mcg total) by mouth 2 (two) times daily with a meal.  Need for zoster vaccination -     Varicella-zoster vaccine subcutaneous  Other orders -     Cancel: clonazePAM (KLONOPIN) 0.5 MG tablet; 1 each night if you awaken in place of Zolpidem   I have discontinued Ms. Bettcher's cyclobenzaprine, HYDROcodone-homatropine, benzonatate, and glipiZIDE. I am also having her start on Exenatide ER, pregabalin, and lubiprostone. Additionally, I am  having her maintain her Probiotic Product (PROBIOTIC PO), multivitamin, Docusate Calcium (STOOL SOFTENER PO), diclofenac, Cholecalciferol (VITAMIN D PO), hydrochlorothiazide, potassium chloride SA, omeprazole, and clonazePAM.  Meds ordered this encounter  Medications  . potassium chloride SA (KLOR-CON M20) 20 MEQ tablet    Sig: Take 1 tablet (20 mEq total) by mouth 2 (two) times daily.    Dispense:  180 tablet    Refill:  1  . omeprazole (PRILOSEC) 20 MG capsule    Sig: TAKE 1 CAPSULE BY MOUTH 2 (TWO) TIMES DAILY BEFORE A MEAL.    Dispense:  60 capsule  Refill:  3  . Exenatide ER (BYDUREON) 2 MG PEN    Sig: Inject 1 Act into the skin once a week.    Dispense:  4 each    Refill:  11  . clonazePAM (KLONOPIN) 0.5 MG tablet    Sig: 1 each night if you awaken in place of Zolpidem    Dispense:  30 tablet    Refill:  5  . pregabalin (LYRICA) 75 MG capsule    Sig: Take 1 capsule (75 mg total) by mouth at bedtime.    Dispense:  30 capsule    Refill:  3  . lubiprostone (AMITIZA) 24 MCG capsule    Sig: Take 1 capsule (24 mcg total) by mouth 2 (two) times daily with a meal.    Dispense:  60 capsule    Refill:  11     Follow-up: Return in about 4 months (around 02/16/2016).  Scarlette Calico, MD

## 2015-10-16 NOTE — Progress Notes (Signed)
Pre visit review using our clinic review tool, if applicable. No additional management support is needed unless otherwise documented below in the visit note. 

## 2015-10-17 ENCOUNTER — Encounter: Payer: Self-pay | Admitting: Internal Medicine

## 2015-10-17 NOTE — Assessment & Plan Note (Signed)
Framingham risk score is 5%

## 2015-11-17 ENCOUNTER — Telehealth: Payer: Self-pay | Admitting: Internal Medicine

## 2015-11-17 NOTE — Telephone Encounter (Signed)
[facility]  Patient Name: [name]  DOB: 1955-10-03    Initial Comment Caller states she has abdominal pain, feels bloated, light headed, and constipated, thinks it may be due to medication   Nurse Assessment  Nurse: Wynetta Emery, RN, Baker Janus Date/Time Eilene Ghazi Time): 11/17/2015 10:17:47 AM  Confirm and document reason for call. If symptomatic, describe symptoms. You must click the next button to save text entered. ---[name] is having abdominal bloating, back pain constipation lightheaded and thinks it is the medication -- the injection she is takes once a week  Has the patient traveled out of the country within the last 30 days? ---No  Does the patient have any new or worsening symptoms? ---Yes  Will a triage be completed? ---Yes  Related visit to physician within the last 2 weeks? ---No  Does the PT have any chronic conditions? (i.e. diabetes, asthma, etc.) ---No  Is this a behavioral health or substance abuse call? ---No     Guidelines    Guideline Title Affirmed Question Affirmed Notes  Constipation Abdomen is more swollen than usual    Final Disposition User   See Physician within 24 Hours Wynetta Emery, RN, Baker Janus    Comments  NOTE NO AVAILABILITY TODAY with HER MD wants to see her MD ONLY advised she can go to [facility] tomorrow call for appt -- wants appt with MD on Monday scheduled advised if she gets worse go to [facility] tomorrow.  915am 11-20-2015 Dr. Ronnald Ramp appt time given   Referrals  GO TO FACILITY UNDECIDED  [facility]   Disagree/Comply: Comply

## 2015-11-20 ENCOUNTER — Encounter: Payer: Self-pay | Admitting: Internal Medicine

## 2015-11-20 ENCOUNTER — Ambulatory Visit (INDEPENDENT_AMBULATORY_CARE_PROVIDER_SITE_OTHER): Payer: BLUE CROSS/BLUE SHIELD | Admitting: Internal Medicine

## 2015-11-20 VITALS — BP 118/78 | HR 80 | Temp 98.2°F | Resp 16 | Ht 68.0 in | Wt 244.0 lb

## 2015-11-20 DIAGNOSIS — E118 Type 2 diabetes mellitus with unspecified complications: Secondary | ICD-10-CM

## 2015-11-20 DIAGNOSIS — M797 Fibromyalgia: Secondary | ICD-10-CM

## 2015-11-20 DIAGNOSIS — IMO0001 Reserved for inherently not codable concepts without codable children: Secondary | ICD-10-CM

## 2015-11-20 DIAGNOSIS — K5901 Slow transit constipation: Secondary | ICD-10-CM

## 2015-11-20 LAB — POCT GLUCOSE (DEVICE FOR HOME USE): POC GLUCOSE: 123 mg/dL — AB (ref 70–99)

## 2015-11-20 LAB — POCT GLYCOSYLATED HEMOGLOBIN (HGB A1C): HEMOGLOBIN A1C: 6.2

## 2015-11-20 MED ORDER — LORCASERIN HCL ER 20 MG PO TB24: 1.0000 | ORAL_TABLET | Freq: Every day | ORAL | Status: DC

## 2015-11-20 MED ORDER — MAGNESIUM OXIDE 400 MG PO TABS: 400.0000 mg | ORAL_TABLET | Freq: Two times a day (BID) | ORAL | Status: DC

## 2015-11-20 NOTE — Progress Notes (Signed)
Pre visit review using our clinic review tool, if applicable. No additional management support is needed unless otherwise documented below in the visit note.,mh

## 2015-11-20 NOTE — Patient Instructions (Signed)

## 2015-11-20 NOTE — Progress Notes (Signed)
Subjective:  Patient ID: [name], female    DOB: December 06, 1955  Age: 60 y.o. MRN: [id number]  CC: Constipation   HPI [name] presents for follow-up on constipation. When I saw her a month ago I asked her to try Amitiza, she tells me it did not help, she has continued to use MiraLAX which has provided some relief. She complains her abdomen feels bloated but it is not painful. She is frustrated that she has not been able to lose weight. She thinks that Bydureon may be causing a few episodes of lightheadedness.  Outpatient Prescriptions Prior to Visit  Medication Sig Dispense Refill  . Cholecalciferol (VITAMIN D PO) Take 3 tablets by mouth daily.    . clonazePAM (KLONOPIN) 0.5 MG tablet 1 each night if you awaken in place of Zolpidem 30 tablet 5  . diclofenac (VOLTAREN) 75 MG EC tablet Take 75 mg by mouth as needed. Reported on 07/26/2015  12  . Docusate Calcium (STOOL SOFTENER PO) Take 1 tablet by mouth daily.    . Exenatide ER (BYDUREON) 2 MG PEN Inject 1 Act into the skin once a week. 4 each 11  . hydrochlorothiazide (HYDRODIURIL) 25 MG tablet TAKE 1 TABLET BY MOUTH DAILY. 90 tablet 0  . Multiple Vitamin (MULTIVITAMIN) tablet Take 1 tablet by mouth daily.    Marland Kitchen omeprazole (PRILOSEC) 20 MG capsule TAKE 1 CAPSULE BY MOUTH 2 (TWO) TIMES DAILY BEFORE A MEAL. 60 capsule 3  . potassium chloride SA (KLOR-CON M20) 20 MEQ tablet Take 1 tablet (20 mEq total) by mouth 2 (two) times daily. 180 tablet 1  . pregabalin (LYRICA) 75 MG capsule Take 1 capsule (75 mg total) by mouth at bedtime. 30 capsule 3  . Probiotic Product (PROBIOTIC PO) 1 tablet daily.     Marland Kitchen lubiprostone (AMITIZA) 24 MCG capsule Take 1 capsule (24 mcg total) by mouth 2 (two) times daily with a meal. 60 capsule 11   No facility-administered medications prior to visit.    ROS Review of Systems  Constitutional: Negative.  Negative for fever, chills, diaphoresis, appetite change and fatigue.  HENT: Negative.  Negative for  facial swelling, sinus pressure and trouble swallowing.   Eyes: Negative.   Respiratory: Negative.  Negative for cough, choking, chest tightness, shortness of breath and stridor.   Cardiovascular: Negative.  Negative for chest pain, palpitations and leg swelling.  Gastrointestinal: Positive for constipation. Negative for abdominal pain, blood in stool, abdominal distention and anal bleeding.  Endocrine: Negative.   Genitourinary: Negative.   Musculoskeletal: Positive for myalgias. Negative for back pain, joint swelling, arthralgias and neck pain.  Skin: Negative.  Negative for color change, pallor and rash.  Allergic/Immunologic: Negative.   Neurological: Negative.  Negative for dizziness and weakness.  Hematological: Negative.  Negative for adenopathy. Does not bruise/bleed easily.  Psychiatric/Behavioral: Negative.     Objective:  BP 118/78 mmHg  Pulse 80  Temp(Src) 98.2 F (36.8 C) (Oral)  Resp 16  Ht 5\' 8"  (1.727 m)  Wt 244 lb (110.678 kg)  BMI 37.11 kg/m2  SpO2 98%  BP Readings from Last 3 Encounters:  11/20/15 118/78  10/16/15 122/82  08/06/15 120/76    Wt Readings from Last 3 Encounters:  11/20/15 244 lb (110.678 kg)  10/16/15 243 lb (110.224 kg)  08/06/15 237 lb (107.502 kg)    Physical Exam  Constitutional: She is oriented to person, place, and time. No distress.  HENT:  Mouth/Throat: Oropharynx is clear and moist. No oropharyngeal exudate.  Eyes: Conjunctivae are normal. Right eye exhibits no discharge. Left eye exhibits no discharge. No scleral icterus.  Neck: Normal range of motion. Neck supple. No JVD present. No tracheal deviation present. No thyromegaly present.  Cardiovascular: Normal rate, regular rhythm, normal heart sounds and intact distal pulses.  Exam reveals no gallop and no friction rub.   No murmur heard. Pulmonary/Chest: Effort normal and breath sounds normal. No stridor. No respiratory distress. She has no wheezes. She has no rales. She  exhibits no tenderness.  Abdominal: Soft. Bowel sounds are normal. She exhibits no distension and no mass. There is no tenderness. There is no rebound and no guarding.  Musculoskeletal: Normal range of motion. She exhibits no edema or tenderness.  Lymphadenopathy:    She has no cervical adenopathy.  Neurological: She is oriented to person, place, and time.  Skin: Skin is warm and dry. No rash noted. She is not diaphoretic. No erythema. No pallor.  Vitals reviewed.   Lab Results  Component Value Date   WBC 5.2 10/16/2015   HGB 14.1 10/16/2015   HCT 40.5 10/16/2015   PLT 247.0 10/16/2015   GLUCOSE 101* 10/16/2015   CHOL 265* 10/16/2015   TRIG 275.0* 10/16/2015   HDL 42.40 10/16/2015   LDLDIRECT 179.0 10/16/2015   LDLCALC 172 01/07/2013   ALT 38* 10/16/2015   AST 16 10/16/2015   NA 140 10/16/2015   K 3.7 10/16/2015   CL 99 10/16/2015   CREATININE 0.75 10/16/2015   BUN 15 10/16/2015   CO2 32 10/16/2015   TSH 0.41 10/16/2015   HGBA1C 6.2 10/16/2015   MICROALBUR 5.0* 10/16/2015    Nm Gastric Emptying  09/09/2014  CLINICAL DATA:  Bloating, gastroesophageal reflux, gas EXAM: NUCLEAR MEDICINE GASTRIC EMPTYING SCAN TECHNIQUE: After oral ingestion of radiolabeled meal, sequential abdominal images were obtained for 4 hours. Percentage of activity emptying the stomach calculated at 1 hour, 2 hour, 3 hour, and 4 hours. RADIOPHARMACEUTICALS:  2 mCi Technetium 99-m labeled sulfur colloid COMPARISON:  05/19/2012 FINDINGS: Expected location of the stomach in the left upper quadrant. Ingested meal empties the stomach gradually over the course of the study. 20% emptied at 1 hr ( normal >= 10%) 87% emptied at 2 hr ( normal >= 40%) 96% emptied at 3 hr ( normal >= 70%) IMPRESSION: Normal gastric emptying study. Electronically Signed   By: Kathreen Devoid   On: 09/09/2014 13:04    Assessment & Plan:   [name] was seen today for constipation.  Diagnoses and all orders for this visit:  Fibromyalgia  muscle pain- her recent labs showed a magnesium level that was in the low/normal range, start magnesium supplementation to see if this helps her with the pain -     magnesium oxide (MAG-OX) 400 MG tablet; Take 1 tablet (400 mg total) by mouth 2 (two) times daily.  Slow transit constipation- her recent magnesium level is in the low normal range, will offer her magnesium supplement to see if this helps her with the constipation. -     magnesium oxide (MAG-OX) 400 MG tablet; Take 1 tablet (400 mg total) by mouth 2 (two) times daily.  Obesity, Class II, BMI 35-39.9, with comorbidity ([place])- in addition to diet and exercise regimen and I have asked her to try Belviq to help her decrease her caloric intake and lose weight. -     Lorcaserin HCl ER (BELVIQ XR) 20 MG TB24; Take 1 tablet by mouth daily.   I have discontinued Ms. Rinn's lubiprostone. I  am also having her start on magnesium oxide and Lorcaserin HCl ER. Additionally, I am having her maintain her Probiotic Product (PROBIOTIC PO), multivitamin, Docusate Calcium (STOOL SOFTENER PO), diclofenac, Cholecalciferol (VITAMIN D PO), hydrochlorothiazide, potassium chloride SA, omeprazole, Exenatide ER, clonazePAM, and pregabalin.  Meds ordered this encounter  Medications  . magnesium oxide (MAG-OX) 400 MG tablet    Sig: Take 1 tablet (400 mg total) by mouth 2 (two) times daily.    Dispense:  60 tablet    Refill:  3  . Lorcaserin HCl ER (BELVIQ XR) 20 MG TB24    Sig: Take 1 tablet by mouth daily.    Dispense:  30 tablet    Refill:  5     Follow-up: Return in about 3 months (around 02/20/2016).  Scarlette Calico, MD

## 2015-12-19 DIAGNOSIS — M25519 Pain in unspecified shoulder: Secondary | ICD-10-CM | POA: Diagnosis not present

## 2016-01-04 ENCOUNTER — Telehealth: Payer: Self-pay

## 2016-01-04 NOTE — Telephone Encounter (Signed)
PA initiated and APPROVED through 06/30/2016 via CoverMyMeds key [id number]. Pt advised of same

## 2016-02-24 ENCOUNTER — Other Ambulatory Visit: Payer: Self-pay | Admitting: Internal Medicine

## 2016-03-08 ENCOUNTER — Other Ambulatory Visit: Payer: Self-pay | Admitting: Obstetrics and Gynecology

## 2016-03-08 DIAGNOSIS — Z1231 Encounter for screening mammogram for malignant neoplasm of breast: Secondary | ICD-10-CM

## 2016-03-21 ENCOUNTER — Ambulatory Visit
Admission: RE | Admit: 2016-03-21 | Discharge: 2016-03-21 | Disposition: A | Discharge status: Home / Self Care | Payer: BLUE CROSS/BLUE SHIELD | Source: Ambulatory Visit | Attending: Obstetrics and Gynecology | Admitting: Obstetrics and Gynecology

## 2016-03-21 DIAGNOSIS — Z1231 Encounter for screening mammogram for malignant neoplasm of breast: Secondary | ICD-10-CM

## 2016-03-23 ENCOUNTER — Other Ambulatory Visit: Payer: Self-pay | Admitting: Internal Medicine

## 2016-03-23 DIAGNOSIS — K5901 Slow transit constipation: Secondary | ICD-10-CM

## 2016-03-23 DIAGNOSIS — M797 Fibromyalgia: Secondary | ICD-10-CM

## 2016-04-10 ENCOUNTER — Other Ambulatory Visit: Payer: Self-pay | Admitting: Internal Medicine

## 2016-04-10 DIAGNOSIS — G479 Sleep disorder, unspecified: Secondary | ICD-10-CM

## 2016-04-10 DIAGNOSIS — M797 Fibromyalgia: Secondary | ICD-10-CM

## 2016-04-11 NOTE — Telephone Encounter (Signed)
rx faxed to [facility].  

## 2016-04-21 ENCOUNTER — Other Ambulatory Visit: Payer: Self-pay | Admitting: Internal Medicine

## 2016-04-21 DIAGNOSIS — K219 Gastro-esophageal reflux disease without esophagitis: Secondary | ICD-10-CM

## 2016-05-29 ENCOUNTER — Other Ambulatory Visit: Payer: Self-pay | Admitting: Internal Medicine

## 2016-05-29 NOTE — Telephone Encounter (Signed)
Pt was to make an appt in Sept 2017. No appt made. Forwarding for PCP review to refill HCTZ

## 2016-06-03 ENCOUNTER — Other Ambulatory Visit: Payer: Self-pay | Admitting: Internal Medicine

## 2016-06-03 DIAGNOSIS — IMO0001 Reserved for inherently not codable concepts without codable children: Secondary | ICD-10-CM

## 2016-06-04 NOTE — Telephone Encounter (Signed)
rx faxed to [facility] 

## 2016-07-09 ENCOUNTER — Encounter: Payer: Self-pay | Admitting: Internal Medicine

## 2016-07-09 DIAGNOSIS — H524 Presbyopia: Secondary | ICD-10-CM | POA: Diagnosis not present

## 2016-07-09 DIAGNOSIS — E119 Type 2 diabetes mellitus without complications: Secondary | ICD-10-CM | POA: Diagnosis not present

## 2016-07-09 DIAGNOSIS — H40003 Preglaucoma, unspecified, bilateral: Secondary | ICD-10-CM | POA: Diagnosis not present

## 2016-07-09 DIAGNOSIS — H5213 Myopia, bilateral: Secondary | ICD-10-CM | POA: Diagnosis not present

## 2016-07-09 LAB — HM DIABETES EYE EXAM

## 2016-07-11 ENCOUNTER — Encounter: Payer: Self-pay | Admitting: Internal Medicine

## 2016-07-11 NOTE — Progress Notes (Signed)
Result abstracted 

## 2016-07-17 ENCOUNTER — Telehealth: Payer: Self-pay | Admitting: *Deleted

## 2016-07-17 NOTE — Telephone Encounter (Signed)
Pt left msg on triage [facility] is needing a PA on the Belviq...[name]

## 2016-07-19 NOTE — Telephone Encounter (Signed)
Attempted PA but insurance information that we have is not accurate

## 2016-07-19 NOTE — Telephone Encounter (Signed)
Called pt and confirmed insurance information.   Key: [id number]

## 2016-07-22 NOTE — Telephone Encounter (Signed)
Clinical questions answered.   Will contact when I have a response.

## 2016-07-22 NOTE — Telephone Encounter (Signed)
Left msg on triage requesting status on PA for Belviq...Johny Chess

## 2016-07-24 NOTE — Telephone Encounter (Signed)
Received fax stating that belviq was denied.   Is there an alternative pt can try.

## 2016-08-25 ENCOUNTER — Other Ambulatory Visit: Payer: Self-pay | Admitting: Internal Medicine

## 2016-08-26 ENCOUNTER — Other Ambulatory Visit: Payer: Self-pay | Admitting: Internal Medicine

## 2016-09-03 ENCOUNTER — Telehealth: Payer: Self-pay

## 2016-09-03 ENCOUNTER — Other Ambulatory Visit (INDEPENDENT_AMBULATORY_CARE_PROVIDER_SITE_OTHER): Payer: BLUE CROSS/BLUE SHIELD

## 2016-09-03 ENCOUNTER — Ambulatory Visit (INDEPENDENT_AMBULATORY_CARE_PROVIDER_SITE_OTHER): Payer: BLUE CROSS/BLUE SHIELD | Admitting: Internal Medicine

## 2016-09-03 ENCOUNTER — Encounter: Payer: Self-pay | Admitting: Internal Medicine

## 2016-09-03 VITALS — BP 120/76 | HR 68 | Temp 97.0°F | Ht 68.0 in | Wt 239.0 lb

## 2016-09-03 DIAGNOSIS — I10 Essential (primary) hypertension: Secondary | ICD-10-CM

## 2016-09-03 DIAGNOSIS — E1121 Type 2 diabetes mellitus with diabetic nephropathy: Secondary | ICD-10-CM | POA: Diagnosis not present

## 2016-09-03 DIAGNOSIS — K581 Irritable bowel syndrome with constipation: Secondary | ICD-10-CM

## 2016-09-03 DIAGNOSIS — E785 Hyperlipidemia, unspecified: Secondary | ICD-10-CM | POA: Diagnosis not present

## 2016-09-03 DIAGNOSIS — R300 Dysuria: Secondary | ICD-10-CM

## 2016-09-03 DIAGNOSIS — E669 Obesity, unspecified: Secondary | ICD-10-CM

## 2016-09-03 DIAGNOSIS — Z1211 Encounter for screening for malignant neoplasm of colon: Secondary | ICD-10-CM

## 2016-09-03 LAB — CBC WITH DIFFERENTIAL/PLATELET
Basophils Absolute: 0 10*3/uL (ref 0.0–0.1)
Basophils Relative: 0.5 % (ref 0.0–3.0)
EOS ABS: 0.1 10*3/uL (ref 0.0–0.7)
Eosinophils Relative: 2 % (ref 0.0–5.0)
HEMATOCRIT: 40.8 % (ref 36.0–46.0)
HEMOGLOBIN: 14.4 g/dL (ref 12.0–15.0)
LYMPHS PCT: 55 % — AB (ref 12.0–46.0)
Lymphs Abs: 2.4 10*3/uL (ref 0.7–4.0)
MCHC: 35.3 g/dL (ref 30.0–36.0)
MCV: 85 fl (ref 78.0–100.0)
MONO ABS: 0.5 10*3/uL (ref 0.1–1.0)
Monocytes Relative: 12.1 % — ABNORMAL HIGH (ref 3.0–12.0)
Neutro Abs: 1.3 10*3/uL — ABNORMAL LOW (ref 1.4–7.7)
Neutrophils Relative %: 30.4 % — ABNORMAL LOW (ref 43.0–77.0)
Platelets: 223 10*3/uL (ref 150.0–400.0)
RBC: 4.8 Mil/uL (ref 3.87–5.11)
RDW: 14.7 % (ref 11.5–15.5)
WBC: 4.4 10*3/uL (ref 4.0–10.5)

## 2016-09-03 LAB — URINALYSIS, ROUTINE W REFLEX MICROSCOPIC
BILIRUBIN URINE: NEGATIVE
KETONES UR: NEGATIVE
LEUKOCYTES UA: NEGATIVE
Nitrite: NEGATIVE
Specific Gravity, Urine: 1.025 (ref 1.000–1.030)
TOTAL PROTEIN, URINE-UPE24: NEGATIVE
URINE GLUCOSE: NEGATIVE
UROBILINOGEN UA: 0.2 (ref 0.0–1.0)
pH: 6 (ref 5.0–8.0)

## 2016-09-03 LAB — COMPREHENSIVE METABOLIC PANEL
ALBUMIN: 4.4 g/dL (ref 3.5–5.2)
ALK PHOS: 60 U/L (ref 39–117)
ALT: 18 U/L (ref 0–35)
AST: 9 U/L (ref 0–37)
BUN: 15 mg/dL (ref 6–23)
CALCIUM: 10.4 mg/dL (ref 8.4–10.5)
CO2: 31 mEq/L (ref 19–32)
CREATININE: 0.77 mg/dL (ref 0.40–1.20)
Chloride: 101 mEq/L (ref 96–112)
GFR: 97.76 mL/min (ref >60.00)
Glucose, Bld: 109 mg/dL — ABNORMAL HIGH (ref 70–99)
POTASSIUM: 3.9 meq/L (ref 3.5–5.1)
Sodium: 140 mEq/L (ref 135–145)
TOTAL PROTEIN: 7.8 g/dL (ref 6.0–8.3)
Total Bilirubin: 0.5 mg/dL (ref 0.2–1.2)

## 2016-09-03 LAB — THYROID PANEL WITH TSH
Free Thyroxine Index: 2.6 (ref 1.4–3.8)
T3 Uptake: 29 % (ref 22–35)
T4, Total: 9.1 ug/dL (ref 4.5–12.0)
TSH: 0.41 m[IU]/L

## 2016-09-03 LAB — MAGNESIUM: MAGNESIUM: 1.6 mg/dL (ref 1.5–2.5)

## 2016-09-03 LAB — POCT GLYCOSYLATED HEMOGLOBIN (HGB A1C): HEMOGLOBIN A1C: 5.8

## 2016-09-03 MED ORDER — PITAVASTATIN CALCIUM 4 MG PO TABS: 1.0000 | ORAL_TABLET | Freq: Every day | ORAL | 3 refills | Status: DC

## 2016-09-03 MED ORDER — LINACLOTIDE 290 MCG PO CAPS: 290.0000 ug | ORAL_CAPSULE | Freq: Every day | ORAL | 1 refills | Status: DC

## 2016-09-03 NOTE — Progress Notes (Signed)
Subjective:  Patient ID: [name], female    DOB: 1955/10/11  Age: 61 y.o. MRN: [id number]  CC: Hypertension; Hyperlipidemia; Diabetes; and Abdominal Pain   HPI [name] presents for Concerns about chronic abdominal pain. For over a year now she is had chronic, intermittent, diffuse abdominal pain and severe constipation. For the constipation she has tried probiotics, MiraLAX, Colace, and other over-the-counter laxatives. She does get some relief with this combination. She also complains of fatigue and difficulty losing weight. She has had a few episodes of dysuria and foul-smelling urine that she denies hematuria, bladder pain, nausea, vomiting, fever, chills.  Outpatient Medications Prior to Visit  Medication Sig Dispense Refill  . diclofenac (VOLTAREN) 75 MG EC tablet Take 75 mg by mouth as needed. Reported on 07/26/2015  12  . hydrochlorothiazide (HYDRODIURIL) 25 MG tablet TAKE 1 TABLET BY MOUTH DAILY. 90 tablet 0  . LYRICA 75 MG capsule TAKE ONE CAPSULE BY MOUTH AT BEDTIME 30 capsule 5  . potassium chloride SA (KLOR-CON M20) 20 MEQ tablet Take 1 tablet (20 mEq total) by mouth 2 (two) times daily. 180 tablet 1  . Probiotic Product (PROBIOTIC PO) 1 tablet daily.     Mariane Baumgarten Calcium (STOOL SOFTENER PO) Take 1 tablet by mouth daily.    . Exenatide ER (BYDUREON) 2 MG PEN Inject 1 Act into the skin once a week. 4 each 11  . Multiple Vitamin (MULTIVITAMIN) tablet Take 1 tablet by mouth daily.    . Cholecalciferol (VITAMIN D PO) Take 3 tablets by mouth daily.    Marland Kitchen BELVIQ XR 20 MG TB24 TAKE 1 TABLET BY MOUTH EVERY DAY (Patient not taking: Reported on 09/03/2016) 30 tablet 2  . clonazePAM (KLONOPIN) 0.5 MG tablet 1 each night if you awaken in place of Zolpidem (Patient not taking: Reported on 09/03/2016) 30 tablet 5  . magnesium oxide (MAG-OX) 400 (241.3 Mg) MG tablet TAKE 1 TABLET (400 MG TOTAL) BY MOUTH 2 (TWO) TIMES DAILY. 60 tablet 3  . omeprazole (PRILOSEC) 20 MG capsule TAKE 1  CAPSULE BY MOUTH 2 (TWO) TIMES DAILY BEFORE A MEAL. (Patient not taking: Reported on 09/03/2016) 60 capsule 11   No facility-administered medications prior to visit.     ROS Review of Systems  Constitutional: Positive for fatigue. Negative for chills, diaphoresis, fever and unexpected weight change.  HENT: Negative.   Eyes: Negative.   Respiratory: Negative.  Negative for cough, chest tightness, shortness of breath, wheezing and stridor.   Cardiovascular: Negative for chest pain, palpitations and leg swelling.  Gastrointestinal: Positive for abdominal pain and constipation. Negative for abdominal distention, diarrhea, nausea and vomiting.  Endocrine: Negative for cold intolerance and heat intolerance.  Genitourinary: Positive for dysuria. Negative for decreased urine volume, difficulty urinating, frequency, hematuria, urgency, vaginal bleeding and vaginal discharge.  Musculoskeletal: Positive for arthralgias. Negative for joint swelling, myalgias and neck stiffness.  Skin: Negative.  Negative for rash.  Allergic/Immunologic: Negative.   Neurological: Negative.  Negative for dizziness, weakness and numbness.  Hematological: Negative.  Negative for adenopathy. Does not bruise/bleed easily.  Psychiatric/Behavioral: Negative.     Objective:  BP 120/76 (BP Location: Right Arm, Patient Position: Sitting, Cuff Size: Large)   Pulse 68   Temp 97 F (36.1 C) (Oral)   Ht 5\' 8"  (1.727 m)   Wt 239 lb (108.4 kg)   SpO2 97%   BMI 36.34 kg/m   BP Readings from Last 3 Encounters:  09/03/16 120/76  11/20/15 118/78  10/16/15  122/82    Wt Readings from Last 3 Encounters:  09/03/16 239 lb (108.4 kg)  11/20/15 244 lb (110.7 kg)  10/16/15 243 lb (110.2 kg)    Physical Exam  Constitutional: She is oriented to person, place, and time.  Non-toxic appearance. She does not have a sickly appearance. She does not appear ill. No distress.  HENT:  Mouth/Throat: Oropharynx is clear and moist. No  oropharyngeal exudate.  Eyes: Conjunctivae are normal. Right eye exhibits no discharge. Left eye exhibits no discharge. No scleral icterus.  Neck: Normal range of motion. Neck supple. No JVD present. No tracheal deviation present. No thyromegaly present.  Cardiovascular: Normal rate, regular rhythm, normal heart sounds and intact distal pulses.  Exam reveals no gallop and no friction rub.   No murmur heard. Pulmonary/Chest: Effort normal and breath sounds normal. No stridor. No respiratory distress. She has no wheezes. She has no rales. She exhibits no tenderness.  Abdominal: Soft. Bowel sounds are normal. She exhibits no distension and no mass. There is no tenderness. There is no rebound and no guarding.  Musculoskeletal: Normal range of motion. She exhibits no edema, tenderness or deformity.  Lymphadenopathy:    She has no cervical adenopathy.  Neurological: She is oriented to person, place, and time.  Skin: Skin is warm and dry. No rash noted. She is not diaphoretic. No erythema. No pallor.  Vitals reviewed.   Lab Results  Component Value Date   WBC 4.4 09/03/2016   HGB 14.4 09/03/2016   HCT 40.8 09/03/2016   PLT 223.0 09/03/2016   GLUCOSE 109 (H) 09/03/2016   CHOL 265 (H) 10/16/2015   TRIG 275.0 (H) 10/16/2015   HDL 42.40 10/16/2015   LDLDIRECT 179.0 10/16/2015   LDLCALC 172 01/07/2013   ALT 18 09/03/2016   AST 9 09/03/2016   NA 140 09/03/2016   K 3.9 09/03/2016   CL 101 09/03/2016   CREATININE 0.77 09/03/2016   BUN 15 09/03/2016   CO2 31 09/03/2016   TSH 0.41 09/03/2016   HGBA1C 5.8 09/03/2016   MICROALBUR 5.0 (H) 10/16/2015    Mm Screening Breast Tomo Bilateral  Result Date: 03/21/2016 CLINICAL DATA:  Screening. EXAM: 2D DIGITAL SCREENING BILATERAL MAMMOGRAM WITH CAD AND ADJUNCT TOMO COMPARISON:  Previous exam(s). ACR Breast Density Category a: The breast tissue is almost entirely fatty. FINDINGS: There are no findings suspicious for malignancy. Images were processed  with CAD. IMPRESSION: No mammographic evidence of malignancy. A result letter of this screening mammogram will be mailed directly to the patient. RECOMMENDATION: Screening mammogram in one year. (Code:SM-B-01Y) BI-RADS CATEGORY  1: Negative. Electronically Signed   By: Lillia Mountain M.D.   On: 03/22/2016 08:01    Assessment & Plan:   Mardee was seen today for hypertension, hyperlipidemia, diabetes and abdominal pain.  Diagnoses and all orders for this visit:  HYPERTENSION, BENIGN ESSENTIAL- her blood pressures adequately well-controlled, electrolytes and renal function are normal, will continue hydrochlorothiazide for blood pressure control. -     Comprehensive metabolic panel; Future -     CBC with Differential/Platelet; Future -     Magnesium; Future -     Thyroid Panel With TSH; Future  Controlled type 2 diabetes mellitus with diabetic nephropathy, without long-term current use of insulin ([place])- her A1c is down to 5.8% so I told her she could stop using the GLP-1 agonist -     POCT HgB A1C -     Pitavastatin Calcium (LIVALO) 4 MG TABS; Take 1 tablet (4 mg  total) by mouth daily. -     Comprehensive metabolic panel; Future -     Cancel: Hemoglobin A1c; Future  Hyperlipidemia with target LDL less than 100- she has not achieved her LDL goal so I've asked her to start taking a statin. -     Pitavastatin Calcium (LIVALO) 4 MG TABS; Take 1 tablet (4 mg total) by mouth daily. -     Thyroid Panel With TSH; Future  Irritable bowel syndrome with constipation- she is using multiple over-the-counter remedies with very minimal symptom relief so I have asked her to start taking Linzess to treat the constipation and the pain. Her labs are negative for any secondary metabolic causes. -     linaclotide (LINZESS) 290 MCG CAPS capsule; Take 1 capsule (290 mcg total) by mouth daily before breakfast.  Dysuria- her urinalysis is normal, for now we will withhold antibiotics. Will treat if the urine culture is  positive. -     Urinalysis, Routine w reflex microscopic; Future -     CULTURE, URINE COMPREHENSIVE; Future  Obesity (BMI 30-39.9) -     Amb Ref to Medical Weight Management  Colon cancer screening -     Ambulatory referral to Gastroenterology   I have discontinued Ms. Hinnenkamp's multivitamin, Docusate Calcium (STOOL SOFTENER PO), Exenatide ER, clonazePAM, magnesium oxide, omeprazole, and BELVIQ XR. I am also having her start on linaclotide and Pitavastatin Calcium. Additionally, I am having her maintain her Probiotic Product (PROBIOTIC PO), diclofenac, Cholecalciferol (VITAMIN D PO), potassium chloride SA, LYRICA, and hydrochlorothiazide.  Meds ordered this encounter  Medications  . linaclotide (LINZESS) 290 MCG CAPS capsule    Sig: Take 1 capsule (290 mcg total) by mouth daily before breakfast.    Dispense:  90 capsule    Refill:  1  . Pitavastatin Calcium (LIVALO) 4 MG TABS    Sig: Take 1 tablet (4 mg total) by mouth daily.    Dispense:  90 tablet    Refill:  3     Follow-up: Return in about 4 weeks (around 10/01/2016).  Scarlette Calico, MD

## 2016-09-03 NOTE — Progress Notes (Signed)
Pre visit review using our clinic review tool, if applicable. No additional management support is needed unless otherwise documented below in the visit note. 

## 2016-09-03 NOTE — Telephone Encounter (Addendum)
Key: [id number] Started via cover my meds  PA approved, pt notified

## 2016-09-03 NOTE — Patient Instructions (Signed)

## 2016-09-05 ENCOUNTER — Encounter: Payer: Self-pay | Admitting: Gastroenterology

## 2016-09-05 LAB — CULTURE, URINE COMPREHENSIVE

## 2016-09-06 ENCOUNTER — Encounter: Payer: Self-pay | Admitting: Internal Medicine

## 2016-09-16 ENCOUNTER — Encounter: Payer: Self-pay | Admitting: Gastroenterology

## 2016-10-01 ENCOUNTER — Other Ambulatory Visit: Payer: Self-pay | Admitting: Internal Medicine

## 2016-10-09 ENCOUNTER — Ambulatory Visit (INDEPENDENT_AMBULATORY_CARE_PROVIDER_SITE_OTHER): Payer: BLUE CROSS/BLUE SHIELD | Admitting: Internal Medicine

## 2016-10-09 ENCOUNTER — Encounter: Payer: Self-pay | Admitting: Internal Medicine

## 2016-10-09 DIAGNOSIS — Q181 Preauricular sinus and cyst: Secondary | ICD-10-CM | POA: Diagnosis not present

## 2016-10-09 MED ORDER — AMOXICILLIN-POT CLAVULANATE 875-125 MG PO TABS: 1.0000 | ORAL_TABLET | Freq: Two times a day (BID) | ORAL | 0 refills | Status: DC

## 2016-10-09 NOTE — Patient Instructions (Signed)
We have sent in the augmentin for the ear. Take 1 pill twice a day for 1 week.

## 2016-10-09 NOTE — Progress Notes (Signed)
   Subjective:    Patient ID: [name], female    DOB: [birth year], 61 y.o.   MRN: [id number]  HPI The patient is a 61 YO female coming in for ear swelling and pain. Started about 2-3 days ago. She has had this in the past and it did spontaneously clear and drain out material. She is having some discomfort in her ear (left) without hearing changes. No allergy symptoms. No sore throat, no nose drainage. No cheek or eye swelling. The area right around the ear is swollen and no drainage. No fevers or chills. Overall it is worsening. She has used heat on the area and ibuprofen for pain without relief.   Review of Systems  Constitutional: Negative for activity change, appetite change, fatigue, fever and unexpected weight change.  HENT: Positive for ear pain and facial swelling. Negative for congestion, dental problem, ear discharge, hearing loss, postnasal drip, rhinorrhea, sinus pain, sinus pressure, sore throat, tinnitus, trouble swallowing and voice change.   Eyes: Negative.   Respiratory: Negative.   Cardiovascular: Negative.   Gastrointestinal: Negative.   Musculoskeletal: Positive for myalgias.  Skin: Negative.   Neurological: Negative.       Objective:   Physical Exam  Constitutional: She is oriented to person, place, and time. She appears well-developed and well-nourished.  HENT:  Head: Normocephalic and atraumatic.  Right Ear: External ear normal.  Nose: Nose normal.  Mouth/Throat: Oropharynx is clear and moist.  Left ear pre-auricular with swelling and tender to touch, TM normal and canal normal. Oropharynx normal and nose without crusting.   Eyes: EOM are normal. Pupils are equal, round, and reactive to light.  Neck: Normal range of motion.  Cardiovascular: Normal rate.   Pulmonary/Chest: Effort normal and breath sounds normal.  Abdominal: Soft.  Musculoskeletal: She exhibits no edema.  Neurological: She is alert and oriented to person, place, and time.  Skin: Skin is  warm and dry.   Vitals:   10/09/16 1056  BP: 134/80  Pulse: 71  Resp: 12  Temp: 97.8 F (36.6 C)  TempSrc: Oral  SpO2: 98%  Weight: 244 lb (110.7 kg)  Height: 5\' 8"  (1.727 m)      Assessment & Plan:

## 2016-10-10 DIAGNOSIS — Q181 Preauricular sinus and cyst: Secondary | ICD-10-CM | POA: Insufficient documentation

## 2016-10-10 NOTE — Assessment & Plan Note (Signed)
With infection, rx for augmentin for treatment and can continue to use heat and ibuprofen on the area. Not amenable to I and D today.

## 2016-10-17 ENCOUNTER — Encounter (INDEPENDENT_AMBULATORY_CARE_PROVIDER_SITE_OTHER): Payer: BLUE CROSS/BLUE SHIELD | Admitting: Family Medicine

## 2016-10-29 ENCOUNTER — Ambulatory Visit (AMBULATORY_SURGERY_CENTER): Payer: Self-pay | Admitting: *Deleted

## 2016-10-29 ENCOUNTER — Encounter: Payer: Self-pay | Admitting: Gastroenterology

## 2016-10-29 VITALS — Ht 67.0 in | Wt 244.4 lb

## 2016-10-29 DIAGNOSIS — Z800 Family history of malignant neoplasm of digestive organs: Secondary | ICD-10-CM

## 2016-10-29 MED ORDER — NA SULFATE-K SULFATE-MG SULF 17.5-3.13-1.6 GM/177ML PO SOLN: ORAL | 0 refills | Status: DC

## 2016-10-29 NOTE — Progress Notes (Signed)
Pt denies allergies to eggs or soy products. Denies difficulty with sedation or anesthesia. Denies any diet or weight loss medications. Denies use of supplemental oxygen.  Patient refuses EMMI, and has seen the video already.

## 2016-10-29 NOTE — Progress Notes (Signed)
Patient insisted on speaking to a provider regarding her gastric polyps.  She stated that she was having a lot of bloating, and she wanted that taken care of.  She also asked why we couldn't do both at the same time.  I explained that the double took about 1 hour and that she was only scheduled for 1/2 hour.  I did book her with Amy on the 5th to talk about her GERD.

## 2016-11-05 ENCOUNTER — Other Ambulatory Visit (INDEPENDENT_AMBULATORY_CARE_PROVIDER_SITE_OTHER): Payer: BLUE CROSS/BLUE SHIELD

## 2016-11-05 ENCOUNTER — Encounter: Payer: Self-pay | Admitting: Physician Assistant

## 2016-11-05 ENCOUNTER — Ambulatory Visit (INDEPENDENT_AMBULATORY_CARE_PROVIDER_SITE_OTHER): Payer: BLUE CROSS/BLUE SHIELD | Admitting: Physician Assistant

## 2016-11-05 VITALS — BP 108/60 | HR 68 | Ht 67.0 in | Wt 240.0 lb

## 2016-11-05 DIAGNOSIS — Z8719 Personal history of other diseases of the digestive system: Secondary | ICD-10-CM

## 2016-11-05 DIAGNOSIS — K5909 Other constipation: Secondary | ICD-10-CM

## 2016-11-05 DIAGNOSIS — K219 Gastro-esophageal reflux disease without esophagitis: Secondary | ICD-10-CM

## 2016-11-05 DIAGNOSIS — R1013 Epigastric pain: Secondary | ICD-10-CM

## 2016-11-05 DIAGNOSIS — G8929 Other chronic pain: Secondary | ICD-10-CM

## 2016-11-05 DIAGNOSIS — Z1211 Encounter for screening for malignant neoplasm of colon: Secondary | ICD-10-CM

## 2016-11-05 DIAGNOSIS — R52 Pain, unspecified: Secondary | ICD-10-CM

## 2016-11-05 DIAGNOSIS — Z86010 Personal history of colonic polyps: Secondary | ICD-10-CM

## 2016-11-05 LAB — COMPREHENSIVE METABOLIC PANEL
ALBUMIN: 4.3 g/dL (ref 3.5–5.2)
ALT: 21 U/L (ref 0–35)
AST: 10 U/L (ref 0–37)
Alkaline Phosphatase: 64 U/L (ref 39–117)
BILIRUBIN TOTAL: 0.5 mg/dL (ref 0.2–1.2)
BUN: 14 mg/dL (ref 6–23)
CALCIUM: 10.1 mg/dL (ref 8.4–10.5)
CHLORIDE: 98 meq/L (ref 96–112)
CO2: 33 mEq/L — ABNORMAL HIGH (ref 19–32)
CREATININE: 0.81 mg/dL (ref 0.40–1.20)
GFR: 92.16 mL/min (ref >60.00)
Glucose, Bld: 106 mg/dL — ABNORMAL HIGH (ref 70–99)
Potassium: 3.6 mEq/L (ref 3.5–5.1)
SODIUM: 139 meq/L (ref 135–145)
Total Protein: 7.8 g/dL (ref 6.0–8.3)

## 2016-11-05 MED ORDER — RANITIDINE HCL 150 MG PO TABS: 150.0000 mg | ORAL_TABLET | Freq: Two times a day (BID) | ORAL | 6 refills | Status: DC

## 2016-11-05 NOTE — Patient Instructions (Addendum)
Please go to the basement level to have your labs drawn.  We have sent the following medications to your pharmacy for you to pick up at your convenience: [place].  1. Zantac 150 mg.   We have given you samples of Trulance, take 1, (3 mg tablet ) daily. Let us know if this was effective.    You have been scheduled for a CT scan of the abdomen and pelvis at [facility] ([address]---this is in the same building as [facility]).   You are scheduled on Thursday 11-07-2016 at at 9:30 am. You should arrive at 9:15  to your appointment time for registration. Please follow the written instructions below on the day of your exam:  WARNING: IF YOU ARE ALLERGIC TO IODINE/X-RAY DYE, PLEASE NOTIFY RADIOLOGY IMMEDIATELY AT [phone]! YOU WILL BE GIVEN A 13 HOUR PREMEDICATION PREP.  1) Do not eat  anything after 5:30 am (4 hours prior to your test) 2) You have been given 2 bottles of oral contrast to drink. The solution may taste               better if refrigerated, but do NOT add ice or any other liquid to this solution. Shake             well before drinking.    Drink 1 bottle of contrast @ 7:30 am (2 hours prior to your exam)  Drink 1 bottle of contrast @ 8:30 am (1 hour prior to your exam)  You may take any medications as prescribed with a small amount of water except for the following: Metformin, Glucophage, Glucovance, Avandamet, Riomet, Fortamet, Actoplus Met, Janumet, Glumetza or Metaglip. The above medications must be held the day of the exam AND 48 hours after the exam.  The purpose of you drinking the oral contrast is to aid in the visualization of your intestinal tract. The contrast solution may cause some diarrhea. Before your exam is started, you will be given a small amount of fluid to drink. Depending on your individual set of symptoms, you may also receive an intravenous injection of x-ray contrast/dye. Plan on being at [facility] for 30 minutes  or long, depending on the type of exam you are having performed.  If you have any questions regarding your exam or if you need to reschedule, you may call the CT department at [phone] between the hours of 8:00 am and 5:00 pm, Monday-Friday.  ________________________________________________________________________

## 2016-11-05 NOTE — Progress Notes (Signed)
Reviewed and agree with management plan.  Tasnim Balentine T. Karalyn Kadel, MD FACG 

## 2016-11-05 NOTE — Progress Notes (Signed)
Subjective:    Patient ID: [name], female    DOB: 07-05-1955, 61 y.o.   MRN: [id number]  HPI [name] is a pleasant 61 year old African-American female, known to Dr. Fuller Plan. She has family history of colon cancer in her father and has been scheduled for follow-up colonoscopy this month. Patient comes into the office to discuss upper GI symptoms and possibility of having an endoscopy as well. Last colonoscopy was done in June 2013 per Dr. Fuller Plan and was a normal exam. EGD in 2014 showed class B esophagitis, mild gastritis and she had 10-20, 2-4 mm polyps in the gastric body and fundus. These were biopsies and found to be consistent with fundic gland polyps, H. pylori negative. Patient has not been on a PPI over the past 4 years since diagnosis of fundic gland polyps. She says she uses papaya on a when necessary basis. She has no current concerns about her colon other than chronic constipation. She's not noted any melena or hematochezia. She has tried Linzess and Amitiza without success and does not feel that MiraLAX helped her either. Linzess upset her stomach but did not improve bowel movements. She relates that she's been having epigastric pain over the past several months which is been on a very consistent basis. She says this is present most days exacerbated after eating and is also being awakened frequently in the middle of the night with epigastric pain that sometimes radiates into her back. She says she usually gets up and walks around and tries to go back to bed if she wakes up hurting. She describes this as a crampy type of discomfort. She also feels as if her food may not be digesting well, and" sits"in her upper abdomen. No regular heartburn or indigestion, no dysphagia. Appetite has been okay weight has been stable. No regular aspirin or NSAID use. She says she generally does not feel well in her abdomen and wonders if she has some ongoing infection.  Review of Systems Pertinent positive  and negative review of systems were noted in the above HPI section.  All other review of systems was otherwise negative.  Outpatient Encounter Prescriptions as of 11/05/2016  Medication Sig  . Cholecalciferol (VITAMIN D PO) Take 3 tablets by mouth daily.  . hydrochlorothiazide (HYDRODIURIL) 25 MG tablet TAKE 1 TABLET BY MOUTH DAILY.  Marland Kitchen LYRICA 75 MG capsule TAKE ONE CAPSULE BY MOUTH AT BEDTIME  . Na Sulfate-K Sulfate-Mg Sulf 17.5-3.13-1.6 GM/180ML SOLN Suprep as directed.  No substitutions.  . Pitavastatin Calcium (LIVALO) 4 MG TABS Take 1 tablet (4 mg total) by mouth daily.  . Probiotic Product (PROBIOTIC PO) 1 tablet daily.   . ranitidine (ZANTAC) 150 MG tablet Take 1 tablet (150 mg total) by mouth 2 (two) times daily.   No facility-administered encounter medications on file as of 11/05/2016.    Allergies  Allergen Reactions  . Crestor [Rosuvastatin Calcium]     Muscle aches  . Metformin And Related     abd pain   Patient Active Problem List   Diagnosis Date Noted  . Auricular cyst 10/10/2016  . Dysuria 09/03/2016  . Colon cancer screening 09/03/2016  . Sleep disorder 10/16/2015  . Constipation 10/16/2015  . Routine general medical examination at a health care facility 02/08/2014  . Obesity (BMI 30-39.9) 11/10/2013  . Obesity, Class II, BMI 35-39.9, with comorbidity 01/28/2013  . DJD (degenerative joint disease) of knee 11/08/2011  . Persistent disorder of initiating or maintaining sleep 09/30/2011  . BACK PAIN,  LUMBAR, WITH RADICULOPATHY 08/09/2009  . HYPERTENSION, BENIGN ESSENTIAL 08/03/2009  . FATTY LIVER DISEASE 10/25/2008  . Fibromyalgia muscle pain 10/25/2008  . Diabetes type 2, controlled ([place]) 05/25/2008  . Hyperlipidemia with target LDL less than 100 05/25/2008  . GERD 05/25/2008  . IBS (irritable colon syndrome) 05/25/2008   Social History   Social History  . Marital status: Married    Spouse name: N/A  . Number of children: N/A  . Years of education: N/A    Occupational History  . Financial SVCS at a [employer]    Social History Main Topics  . Smoking status: Former Smoker    Packs/day: 0.10    Years: 10.00    Types: Cigarettes    Quit date: 07/14/1981  . Smokeless tobacco: Never Used  . Alcohol use No  . Drug use: No  . Sexual activity: Not Currently   Other Topics Concern  . Not on file   Social History Narrative  . No narrative on file    [name]'s family history includes Breast cancer in her paternal grandmother; Colon cancer in her father; Diabetes in her brother and sister; Heart disease in her brother and mother.      Objective:    Vitals:   11/05/16 1338  BP: 108/60  Pulse: 68    Physical Exam  well-developed African-American female in no acute distress, pleasant blood pressure 108/60 pulse 68, height 5 foot 7 weight 240, BMI 37.5. HEENT; nontraumatic normocephalic EOMI PERRLA sclera anicteric, Cardiovascular; regular rate and rhythm with S1-S2 no murmur or gallop, Pulmonary; clear bilaterally, Abdomen; obese soft, mildly tender in the epigastrium there is no palpable mass or hepatosplenomegaly bowel sounds are present, Rectal ;exam not done, Extremities; no clubbing cyanosis or edema skin warm and dry, Neuropsych; mood and affect appropriate       Assessment & Plan:   #59 61 year old African-American female with family history of colon cancer due for follow-up colonoscopy, negative colon June 2013 #2 chronic constipation #3 several month history of epigastric pain exacerbated by meals and frequently waking her at night. Etiology not clear, rule out gastritis or peptic ulcer disease, patient is status post cholecystectomy. Rule out other underlying intra-abdominal inflammatory process #4 history of fundic gland gastric polyps #5 history of fibromyalgia #6 history of fatty liver #7 obesity #8 history of IBS  Plan; Will reschedule patient for colonoscopy and EGD with Dr. Fuller Plan. Both procedures discussed  in detail with patient including risks and benefits and she is agreeable to proceed Will schedule for CT of the abdomen and pelvis with contrast Start Zantac 150 mg by mouth twice a day Patient was given samples of Trulance 3 mg 1 by mouth daily for chronic constipation. If this is helpful she will call back and we will send a prescription. If she does not find this helpful she will continue high-fiber diet, liberal fluids  senna, with when necessary milk of magnesia.  Deaven Barron S Marsella Suman PA-C 11/05/2016   Cc: Janith Lima, MD

## 2016-11-07 ENCOUNTER — Ambulatory Visit (INDEPENDENT_AMBULATORY_CARE_PROVIDER_SITE_OTHER)
Admission: RE | Admit: 2016-11-07 | Discharge: 2016-11-07 | Disposition: A | Discharge status: Home / Self Care | Payer: BLUE CROSS/BLUE SHIELD | Source: Ambulatory Visit | Attending: Physician Assistant | Admitting: Physician Assistant

## 2016-11-07 DIAGNOSIS — R109 Unspecified abdominal pain: Secondary | ICD-10-CM | POA: Diagnosis not present

## 2016-11-07 DIAGNOSIS — R1013 Epigastric pain: Secondary | ICD-10-CM

## 2016-11-07 DIAGNOSIS — G8929 Other chronic pain: Secondary | ICD-10-CM

## 2016-11-07 DIAGNOSIS — R52 Pain, unspecified: Secondary | ICD-10-CM

## 2016-11-07 MED ORDER — IOPAMIDOL (ISOVUE-300) INJECTION 61%
100.0000 mL | Freq: Once | INTRAVENOUS | Status: AC | PRN
  Administered 2016-11-07: 100 mL via INTRAVENOUS

## 2016-11-08 ENCOUNTER — Encounter: Payer: Self-pay | Admitting: Physician Assistant

## 2016-11-12 ENCOUNTER — Encounter: Payer: BLUE CROSS/BLUE SHIELD | Admitting: Gastroenterology

## 2016-11-21 ENCOUNTER — Encounter (INDEPENDENT_AMBULATORY_CARE_PROVIDER_SITE_OTHER): Payer: BLUE CROSS/BLUE SHIELD | Admitting: Family Medicine

## 2016-12-05 ENCOUNTER — Other Ambulatory Visit: Payer: Self-pay | Admitting: Internal Medicine

## 2016-12-05 DIAGNOSIS — M797 Fibromyalgia: Secondary | ICD-10-CM

## 2016-12-05 DIAGNOSIS — G479 Sleep disorder, unspecified: Secondary | ICD-10-CM

## 2016-12-09 ENCOUNTER — Encounter (INDEPENDENT_AMBULATORY_CARE_PROVIDER_SITE_OTHER): Payer: Self-pay | Admitting: Family Medicine

## 2016-12-09 ENCOUNTER — Ambulatory Visit (INDEPENDENT_AMBULATORY_CARE_PROVIDER_SITE_OTHER): Payer: BLUE CROSS/BLUE SHIELD | Admitting: Family Medicine

## 2016-12-09 VITALS — BP 125/79 | HR 68 | Temp 97.9°F | Ht 68.0 in | Wt 239.0 lb

## 2016-12-09 DIAGNOSIS — Z1331 Encounter for screening for depression: Secondary | ICD-10-CM

## 2016-12-09 DIAGNOSIS — Z6836 Body mass index (BMI) 36.0-36.9, adult: Secondary | ICD-10-CM

## 2016-12-09 DIAGNOSIS — E119 Type 2 diabetes mellitus without complications: Secondary | ICD-10-CM

## 2016-12-09 DIAGNOSIS — Z0289 Encounter for other administrative examinations: Secondary | ICD-10-CM

## 2016-12-09 DIAGNOSIS — R0602 Shortness of breath: Secondary | ICD-10-CM | POA: Diagnosis not present

## 2016-12-09 DIAGNOSIS — R5383 Other fatigue: Secondary | ICD-10-CM | POA: Diagnosis not present

## 2016-12-09 DIAGNOSIS — E669 Obesity, unspecified: Secondary | ICD-10-CM | POA: Diagnosis not present

## 2016-12-09 DIAGNOSIS — Z1389 Encounter for screening for other disorder: Secondary | ICD-10-CM

## 2016-12-09 DIAGNOSIS — IMO0001 Reserved for inherently not codable concepts without codable children: Secondary | ICD-10-CM

## 2016-12-09 NOTE — Progress Notes (Signed)
Office: [phone]  /  Fax: [phone]   Dear Dr. Ronnald Ramp,   Thank you for referring SISTER CARBONE to our clinic. The following note includes my evaluation and treatment recommendations.  HPI:   Chief Complaint: OBESITY  JUNETTE BERNAT has been referred by Janith Lima, MD for consultation regarding her obesity and obesity related comorbidities.  SAMUELLA RASOOL (MR# [id number]) is a 61 y.o. female who presents on 12/09/2016 for obesity evaluation and treatment. Current BMI is Body mass index is 36.34 kg/m.Marland Kitchen Makaylen has struggled with obesity for years and has been unsuccessful in either losing weight or maintaining long term weight loss. Alexy attended our information session and states she is currently in the action stage of change and ready to dedicate time achieving and maintaining a healthier weight.  Briseyda states her family eats meals together she thinks her family will eat healthier with  her her desired weight loss is 84 lbs she has been heavy most of  her life she started gaining weight 10 years ago her heaviest weight ever was 244 lbs. she has significant food cravings issues  she skips meals frequently she is frequently drinking liquids with calories she frequently makes poor food choices she has problems with excessive hunger  she frequently eats larger portions than normal  she has binge eating behaviors she struggles with emotional eating    Fatigue Charisse feels her energy is lower than it should be. This has worsened with weight gain and has not worsened recently. Lillyn admits to daytime somnolence and  admits to waking up still tired. Patient is at risk for obstructive sleep apnea. Patent has a history of symptoms of daytime fatigue, morning fatigue and morning headache. Patient generally gets 6 hours of sleep per night, and states they generally have restless sleep. Snoring is present. Apneic episodes are present. Epworth Sleepiness Score is 6  Dyspnea on  exertion [name] notes increasing shortness of breath with exercising and seems to be worsening over time with weight gain. She notes getting out of breath sooner with activity than she used to. This has not gotten worse recently. Linetta denies orthopnea.  Diabetes II Amee has a diagnosis of diabetes type II. Elsey states she is not checking BGs at home and denies any hypoglycemic episodes. Last A1c was at 5.8 She is attempting to control with intensive lifestyle modifications including diet, exercise, and weight loss to help control her blood glucose levels. She has been on Metformin and Bydureon in the past but is not currently on medications.   Depression Screen Shanora's Food and Mood (modified PHQ-9) score was  Depression screen PHQ 2/9 12/09/2016  Decreased Interest 1  Down, Depressed, Hopeless 0  PHQ - 2 Score 1  Altered sleeping 3  Tired, decreased energy 3  Change in appetite 1  Feeling bad or failure about yourself  0  Trouble concentrating 1  Moving slowly or fidgety/restless 1  Suicidal thoughts 0  PHQ-9 Score 10    ALLERGIES: Allergies  Allergen Reactions  . Crestor [Rosuvastatin Calcium]     Muscle aches  . Metformin And Related     abd pain    MEDICATIONS: Current Outpatient Prescriptions on File Prior to Visit  Medication Sig Dispense Refill  . Cholecalciferol (VITAMIN D PO) Take 3 tablets by mouth daily.    . hydrochlorothiazide (HYDRODIURIL) 25 MG tablet TAKE 1 TABLET BY MOUTH DAILY. 90 tablet 1  . LYRICA 75 MG capsule TAKE ONE CAPSULE EVERY DAY AT BEDTIME  30 capsule 3  . Na Sulfate-K Sulfate-Mg Sulf 17.5-3.13-1.6 GM/180ML SOLN Suprep as directed.  No substitutions. 354 mL 0  . Probiotic Product (PROBIOTIC PO) 1 tablet daily.     . ranitidine (ZANTAC) 150 MG tablet Take 1 tablet (150 mg total) by mouth 2 (two) times daily. 60 tablet 6  . Pitavastatin Calcium (LIVALO) 4 MG TABS Take 1 tablet (4 mg total) by mouth daily. 90 tablet 3   No current  facility-administered medications on file prior to visit.     PAST MEDICAL HISTORY: Past Medical History:  Diagnosis Date  . Allergy    seasonal allergies  . Arthritis   . Back pain   . Benign fundic gland polyps of stomach   . Constipation   . Diabetes mellitus without complication ([place])   . Esophagitis    LA Class B  . Fatigue   . Fatty liver   . Fatty liver   . Fibromyalgia   . Gallbladder problem   . GERD (gastroesophageal reflux disease)   . Hyperlipidemia   . Hypertension   . IBS (irritable bowel syndrome)   . Joint pain   . Neuromuscular disorder ([place])   . Prediabetes     PAST SURGICAL HISTORY: Past Surgical History:  Procedure Laterality Date  . CHOLECYSTECTOMY  2009   low GB EF, no gallstones  . LAPAROSCOPY     of adhesions/ of ovaries  . VAGINAL HYSTERECTOMY      SOCIAL HISTORY: Social History  Substance Use Topics  . Smoking status: Former Smoker    Packs/day: 0.10    Years: 10.00    Types: Cigarettes    Quit date: 07/14/1981  . Smokeless tobacco: Never Used  . Alcohol use No    FAMILY HISTORY: Family History  Problem Relation Age of Onset  . Colon cancer Father   . Hypertension Father   . Hyperlipidemia Father   . Obesity Father   . Heart disease Mother   . Hypertension Mother   . Hyperlipidemia Mother   . Obesity Mother   . Diabetes Brother        Half  . Heart disease Brother   . Diabetes Sister   . Breast cancer Paternal Grandmother     ROS: Review of Systems  Constitutional: Positive for malaise/fatigue.  Respiratory: Positive for shortness of breath (on exertion).   Cardiovascular: Negative for orthopnea.  Endo/Heme/Allergies:       Negative hypoglycemia    PHYSICAL EXAM: Blood pressure 125/79, pulse 68, temperature 97.9 F (36.6 C), temperature source Oral, height 5\' 8"  (1.727 m), weight 239 lb (108.4 kg), SpO2 98 %. Body mass index is 36.34 kg/m. Physical Exam  Constitutional: She is oriented to person, place, and  time. She appears well-developed and well-nourished.  Cardiovascular: Normal rate.   Pulmonary/Chest: Effort normal.  Musculoskeletal: Normal range of motion.  Neurological: She is oriented to person, place, and time.  Skin: Skin is warm and dry.  Psychiatric: She has a normal mood and affect. Her behavior is normal.  Vitals reviewed.   RECENT LABS AND TESTS: BMET    Component Value Date/Time   NA 139 11/05/2016 1458   K 3.6 11/05/2016 1458   CL 98 11/05/2016 1458   CO2 33 (H) 11/05/2016 1458   GLUCOSE 106 (H) 11/05/2016 1458   BUN 14 11/05/2016 1458   CREATININE 0.81 11/05/2016 1458   CREATININE 0.76 07/26/2015 1500   CALCIUM 10.1 11/05/2016 1458   GFRNONAA 117.58 10/11/2009 0109  GFRAA 113 08/09/2008 0854   Lab Results  Component Value Date   HGBA1C 5.8 09/03/2016   No results found for: INSULIN CBC    Component Value Date/Time   WBC 4.4 09/03/2016 0904   RBC 4.80 09/03/2016 0904   HGB 14.4 09/03/2016 0904   HCT 40.8 09/03/2016 0904   PLT 223.0 09/03/2016 0904   MCV 85.0 09/03/2016 0904   MCV 88.7 09/14/2012 1359   MCH 30.9 07/26/2015 1449   MCHC 35.3 09/03/2016 0904   RDW 14.7 09/03/2016 0904   LYMPHSABS 2.4 09/03/2016 0904   MONOABS 0.5 09/03/2016 0904   EOSABS 0.1 09/03/2016 0904   BASOSABS 0.0 09/03/2016 0904   Iron/TIBC/Ferritin/ %Sat No results found for: IRON, TIBC, FERRITIN, IRONPCTSAT Lipid Panel     Component Value Date/Time   CHOL 265 (H) 10/16/2015 1611   TRIG 275.0 (H) 10/16/2015 1611   TRIG 221 11/14/2010   TRIG 221 11/14/2010   HDL 42.40 10/16/2015 1611   CHOLHDL 6 10/16/2015 1611   VLDL 55.0 (H) 10/16/2015 1611   LDLCALC 172 01/07/2013   LDLCALC 184 (A) 11/14/2010   LDLCALC 184 (A) 11/14/2010   LDLDIRECT 179.0 10/16/2015 1611   Hepatic Function Panel     Component Value Date/Time   PROT 7.8 11/05/2016 1458   ALBUMIN 4.3 11/05/2016 1458   AST 10 11/05/2016 1458   ALT 21 11/05/2016 1458   ALKPHOS 64 11/05/2016 1458   BILITOT  0.5 11/05/2016 1458   BILIDIR 0.1 08/02/2014 0931   IBILI 0.6 01/16/2007 0123      Component Value Date/Time   TSH 0.41 09/03/2016 0904   TSH 0.41 10/16/2015 1611   TSH 0.63 08/02/2014 0931    ECG  shows NSR with a rate of 72 BPM INDIRECT CALORIMETER done today shows a VO2 of 181 and a REE of 1258.    ASSESSMENT AND PLAN: Other fatigue - Plan: EKG 12-Lead, Vitamin B12, CBC With Differential, Folate, Hemoglobin A1c, Insulin, random, T3, T4, free, TSH, Lipid Panel With LDL/HDL Ratio, VITAMIN D 25 Hydroxy (Vit-D Deficiency, Fractures)  Shortness of breath on exertion  Type 2 diabetes mellitus without complication, without long-term current use of insulin (HCC) - Plan: Comprehensive metabolic panel, Microalbumin / creatinine urine ratio  Depression screening  Class 2 obesity with serious comorbidity and body mass index (BMI) of 36.0 to 36.9 in adult, unspecified obesity type  PLAN: Fatigue Asianae was informed that her fatigue may be related to obesity, depression or many other causes. Labs will be ordered, and in the meanwhile Tinna has agreed to work on diet, exercise and weight loss to help with fatigue. Proper sleep hygiene was discussed including the need for 7-8 hours of quality sleep each night. A sleep study was not ordered based on symptoms and Epworth score.  Dyspnea on exertion Shavy's shortness of breath appears to be obesity related and exercise induced. She has agreed to work on weight loss and gradually increase exercise to treat her exercise induced shortness of breath. If Emmaleigh follows our instructions and loses weight without improvement of her shortness of breath, we will plan to refer to pulmonology. We will monitor this condition regularly. Darbi agrees to this plan.  Diabetes II Hanaan has been given extensive diabetes education by myself today including ideal fasting and post-prandial blood glucose readings, individual ideal Hgb A1c goals and hypoglycemia  prevention. We discussed the importance of good blood sugar control to decrease the likelihood of diabetic complications such as nephropathy, neuropathy, limb loss, blindness,  coronary artery disease, and death. We discussed the importance of intensive lifestyle modification including diet, exercise and weight loss as the first line treatment for diabetes. We will check labs and Toshika agrees to continue with diet and exercise and will follow up at the agreed upon time.  Depression Screen Darris had a moderately positive depression screening. Depression is commonly associated with obesity and often results in emotional eating behaviors. We will monitor this closely and work on CBT to help improve the non-hunger eating patterns. Referral to Psychology may be required if no improvement is seen as she continues in our clinic.  Obesity Asha is currently in the action stage of change and her goal is to continue with weight loss efforts. I recommend Dulce begin the structured treatment plan as follows:  She has agreed to follow the Category 2 plan Nikia has been instructed to eventually work up to a goal of 150 minutes of combined cardio and strengthening exercise per week for weight loss and overall health benefits. We discussed the following Behavioral Modification Strategies today: increasing lean protein intake and meal planning & cooking strategies  Noah has agreed to join our obesity program and follow up with our clinic in 2 weeks. She was informed of the importance of frequent follow up visits to maximize her success with intensive lifestyle modifications for her multiple health conditions. She was informed we would discuss her lab results at her next visit unless there is a critical issue that needs to be addressed sooner. Lynnann agreed to keep her next visit at the agreed upon time to discuss these results.  I, Doreene Nest, am acting as transcriptionist for Dennard Nip, MD  I have reviewed  the above documentation for accuracy and completeness, and I agree with the above. -Dennard Nip, MD  OBESITY BEHAVIORAL INTERVENTION VISIT  Today's visit was # 1 out of 28.  Starting weight: 239 lbs Starting date: 12/09/16 Today's weight : 239 lbs Today's date: 12/09/2016 Total lbs lost to date: 0 (Patients must lose 7 lbs in the first 6 months to continue with counseling)   ASK: We discussed the diagnosis of obesity with Cory Roughen today and Parissa agreed to give [place] permission to discuss obesity behavioral modification therapy today.  ASSESS: Keili has the diagnosis of obesity and her BMI today is 60.4 Yolandra is in the action stage of change   ADVISE: Alaya was educated on the multiple health risks of obesity as well as the benefit of weight loss to improve her health. She was advised of the need for long term treatment and the importance of lifestyle modifications.  AGREE: Multiple dietary modification options and treatment options were discussed and  Kamiah agreed to follow the Category 2 plan We discussed the following Behavioral Modification Strategies today: increasing lean protein intake and meal planning & cooking strategies

## 2016-12-10 LAB — CBC WITH DIFFERENTIAL
Basophils Absolute: 0 10*3/uL (ref 0.0–0.2)
Basos: 0 %
EOS (ABSOLUTE): 0.1 10*3/uL (ref 0.0–0.4)
EOS: 1 %
Hematocrit: 41.4 % (ref 34.0–46.6)
Hemoglobin: 14.3 g/dL (ref 11.1–15.9)
IMMATURE GRANULOCYTES: 0 %
Immature Grans (Abs): 0 10*3/uL (ref 0.0–0.1)
LYMPHS ABS: 2.4 10*3/uL (ref 0.7–3.1)
Lymphs: 58 %
MCH: 30.2 pg (ref 26.6–33.0)
MCHC: 34.5 g/dL (ref 31.5–35.7)
MCV: 88 fL (ref 79–97)
MONOCYTES: 8 %
Monocytes Absolute: 0.3 10*3/uL (ref 0.1–0.9)
Neutrophils Absolute: 1.4 10*3/uL (ref 1.4–7.0)
Neutrophils: 33 %
RBC: 4.73 x10E6/uL (ref 3.77–5.28)
RDW: 15.9 % — ABNORMAL HIGH (ref 12.3–15.4)
WBC: 4.1 10*3/uL (ref 3.4–10.8)

## 2016-12-10 LAB — COMPREHENSIVE METABOLIC PANEL
ALBUMIN: 4.6 g/dL (ref 3.6–4.8)
ALK PHOS: 73 IU/L (ref 39–117)
ALT: 30 IU/L (ref 0–32)
AST: 13 IU/L (ref 0–40)
Albumin/Globulin Ratio: 1.4 (ref 1.2–2.2)
BUN / CREAT RATIO: 16 (ref 12–28)
BUN: 11 mg/dL (ref 8–27)
Bilirubin Total: 0.5 mg/dL (ref 0.0–1.2)
CALCIUM: 9.9 mg/dL (ref 8.7–10.3)
CO2: 23 mmol/L (ref 20–29)
CREATININE: 0.7 mg/dL (ref 0.57–1.00)
Chloride: 98 mmol/L (ref 96–106)
GFR, EST AFRICAN AMERICAN: 107 mL/min/{1.73_m2} (ref >59)
GFR, EST NON AFRICAN AMERICAN: 93 mL/min/{1.73_m2} (ref >59)
GLOBULIN, TOTAL: 3.2 g/dL (ref 1.5–4.5)
Glucose: 103 mg/dL — ABNORMAL HIGH (ref 65–99)
Potassium: 3.9 mmol/L (ref 3.5–5.2)
SODIUM: 139 mmol/L (ref 134–144)
Total Protein: 7.8 g/dL (ref 6.0–8.5)

## 2016-12-10 LAB — VITAMIN B12: VITAMIN B 12: 1475 pg/mL — AB (ref 232–1245)

## 2016-12-10 LAB — LIPID PANEL WITH LDL/HDL RATIO
Cholesterol, Total: 261 mg/dL — ABNORMAL HIGH (ref 100–199)
HDL: 55 mg/dL (ref >39)
LDL Calculated: 158 mg/dL — ABNORMAL HIGH (ref 0–99)
LDL/HDL RATIO: 2.9 ratio (ref 0.0–3.2)
Triglycerides: 239 mg/dL — ABNORMAL HIGH (ref 0–149)
VLDL Cholesterol Cal: 48 mg/dL — ABNORMAL HIGH (ref 5–40)

## 2016-12-10 LAB — FOLATE: Folate: >20 ng/mL (ref >3.0)

## 2016-12-10 LAB — T4, FREE: FREE T4: 1.26 ng/dL (ref 0.82–1.77)

## 2016-12-10 LAB — T3: T3 TOTAL: 147 ng/dL (ref 71–180)

## 2016-12-10 LAB — HEMOGLOBIN A1C
Est. average glucose Bld gHb Est-mCnc: 137 mg/dL
Hgb A1c MFr Bld: 6.4 % — ABNORMAL HIGH (ref 4.8–5.6)

## 2016-12-10 LAB — VITAMIN D 25 HYDROXY (VIT D DEFICIENCY, FRACTURES): VIT D 25 HYDROXY: 26.6 ng/mL — AB (ref 30.0–100.0)

## 2016-12-10 LAB — MICROALBUMIN / CREATININE URINE RATIO
CREATININE, UR: 105 mg/dL
MICROALBUM., U, RANDOM: 23.2 ug/mL
Microalb/Creat Ratio: 22.1 mg/g creat (ref 0.0–30.0)

## 2016-12-10 LAB — TSH: TSH: 0.533 u[IU]/mL (ref 0.450–4.500)

## 2016-12-10 LAB — INSULIN, RANDOM: INSULIN: 18.6 u[IU]/mL (ref 2.6–24.9)

## 2016-12-20 ENCOUNTER — Encounter: Payer: Self-pay | Admitting: Gastroenterology

## 2016-12-24 ENCOUNTER — Ambulatory Visit (INDEPENDENT_AMBULATORY_CARE_PROVIDER_SITE_OTHER): Payer: BLUE CROSS/BLUE SHIELD | Admitting: Family Medicine

## 2016-12-24 VITALS — BP 121/78 | HR 66 | Temp 98.6°F | Ht 68.0 in | Wt 238.0 lb

## 2016-12-24 DIAGNOSIS — E784 Other hyperlipidemia: Secondary | ICD-10-CM

## 2016-12-24 DIAGNOSIS — E559 Vitamin D deficiency, unspecified: Secondary | ICD-10-CM

## 2016-12-24 DIAGNOSIS — E119 Type 2 diabetes mellitus without complications: Secondary | ICD-10-CM

## 2016-12-24 DIAGNOSIS — E669 Obesity, unspecified: Secondary | ICD-10-CM | POA: Diagnosis not present

## 2016-12-24 DIAGNOSIS — E7849 Other hyperlipidemia: Secondary | ICD-10-CM

## 2016-12-24 DIAGNOSIS — Z6836 Body mass index (BMI) 36.0-36.9, adult: Secondary | ICD-10-CM | POA: Diagnosis not present

## 2016-12-24 DIAGNOSIS — IMO0001 Reserved for inherently not codable concepts without codable children: Secondary | ICD-10-CM

## 2016-12-24 MED ORDER — VITAMIN D (ERGOCALCIFEROL) 1.25 MG (50000 UNIT) PO CAPS: 50000.0000 [IU] | ORAL_CAPSULE | ORAL | 0 refills | Status: DC

## 2016-12-24 NOTE — Progress Notes (Signed)
Office: [phone]  /  Fax: [phone]   HPI:   Chief Complaint: OBESITY [name] is here to discuss her progress with her obesity treatment plan. She is on the  follow the Category 2 plan and is following her eating plan approximately 50 % of the time. She states she is exercising 0 minutes 0 times per week. [name] didn't like the plan, she didn't want to eat as much protein and she struggled to decrease sugar. Her weight is 238 lb (108 kg) today and has had a weight loss of 1 pound over a period of 2 weeks since her last visit. She has lost 1 lb since starting treatment with [place].  Vitamin D deficiency [name] has a diagnosis of vitamin D deficiency. She is currently taking vit D and admits fatigue but denies nausea, vomiting or muscle weakness.  Diabetes II [name] has a diagnosis of diabetes type II.  [name] is not on medications currently and denies any hypoglycemic episodes. Last A1c was at 6.4  She has been working on intensive lifestyle modifications including diet, exercise, and weight loss to help control her blood glucose levels. [name] would like to control her diabetes with diet.  Hyperlipidemia [name] has hyperlipidemia and has been trying to control her cholesterol levels with intensive lifestyle modification including a low saturated fat diet, exercise and weight loss. She denies any chest pain, claudication or myalgias.   ALLERGIES: Allergies  Allergen Reactions  . Crestor [Rosuvastatin Calcium]     Muscle aches  . Metformin And Related     abd pain    MEDICATIONS: Current Outpatient Prescriptions on File Prior to Visit  Medication Sig Dispense Refill  . hydrochlorothiazide (HYDRODIURIL) 25 MG tablet TAKE 1 TABLET BY MOUTH DAILY. 90 tablet 1  . LYRICA 75 MG capsule TAKE ONE CAPSULE EVERY DAY AT BEDTIME 30 capsule 3  . Na Sulfate-K Sulfate-Mg Sulf 17.5-3.13-1.6 GM/180ML SOLN Suprep as directed.  No substitutions. 354 mL 0  . Pitavastatin Calcium (LIVALO) 4 MG TABS Take 1  tablet (4 mg total) by mouth daily. 90 tablet 3  . Probiotic Product (PROBIOTIC PO) 1 tablet daily.     . ranitidine (ZANTAC) 150 MG tablet Take 1 tablet (150 mg total) by mouth 2 (two) times daily. 60 tablet 6   No current facility-administered medications on file prior to visit.     PAST MEDICAL HISTORY: Past Medical History:  Diagnosis Date  . Allergy    seasonal allergies  . Arthritis   . Back pain   . Benign fundic gland polyps of stomach   . Constipation   . Diabetes mellitus without complication ([place])   . Esophagitis    LA Class B  . Fatigue   . Fatty liver   . Fatty liver   . Fibromyalgia   . Gallbladder problem   . GERD (gastroesophageal reflux disease)   . Hyperlipidemia   . Hypertension   . IBS (irritable bowel syndrome)   . Joint pain   . Neuromuscular disorder ([place])   . Prediabetes     PAST SURGICAL HISTORY: Past Surgical History:  Procedure Laterality Date  . CHOLECYSTECTOMY  2009   low GB EF, no gallstones  . LAPAROSCOPY     of adhesions/ of ovaries  . VAGINAL HYSTERECTOMY      SOCIAL HISTORY: Social History  Substance Use Topics  . Smoking status: Former Smoker    Packs/day: 0.10    Years: 10.00    Types: Cigarettes    Quit  date: 07/14/1981  . Smokeless tobacco: Never Used  . Alcohol use No    FAMILY HISTORY: Family History  Problem Relation Age of Onset  . Colon cancer Father   . Hypertension Father   . Hyperlipidemia Father   . Obesity Father   . Heart disease Mother   . Hypertension Mother   . Hyperlipidemia Mother   . Obesity Mother   . Diabetes Brother        Half  . Heart disease Brother   . Diabetes Sister   . Breast cancer Paternal Grandmother     ROS: Review of Systems  Constitutional: Positive for malaise/fatigue and weight loss.  Cardiovascular: Negative for chest pain and claudication.  Gastrointestinal: Negative for nausea and vomiting.  Musculoskeletal: Negative for myalgias.       Negative muscle weakness    Endo/Heme/Allergies:       Negative hypoglycemia    PHYSICAL EXAM: Blood pressure 121/78, pulse 66, temperature 98.6 F (37 C), temperature source Oral, height 5\' 8"  (1.727 m), weight 238 lb (108 kg), SpO2 98 %. Body mass index is 36.19 kg/m. Physical Exam  Constitutional: She is oriented to person, place, and time. She appears well-developed and well-nourished.  Cardiovascular: Normal rate.   Pulmonary/Chest: Effort normal.  Musculoskeletal: Normal range of motion.  Neurological: She is oriented to person, place, and time.  Skin: Skin is warm and dry.  Psychiatric: She has a normal mood and affect. Her behavior is normal.  Vitals reviewed.   RECENT LABS AND TESTS: BMET    Component Value Date/Time   NA 139 12/09/2016 1145   K 3.9 12/09/2016 1145   CL 98 12/09/2016 1145   CO2 23 12/09/2016 1145   GLUCOSE 103 (H) 12/09/2016 1145   GLUCOSE 106 (H) 11/05/2016 1458   BUN 11 12/09/2016 1145   CREATININE 0.70 12/09/2016 1145   CREATININE 0.76 07/26/2015 1500   CALCIUM 9.9 12/09/2016 1145   GFRNONAA 93 12/09/2016 1145   GFRAA 107 12/09/2016 1145   Lab Results  Component Value Date   HGBA1C 6.4 (H) 12/09/2016   HGBA1C 5.8 09/03/2016   HGBA1C 6.2 11/20/2015   HGBA1C 6.2 10/16/2015   HGBA1C 6.9 07/26/2015   Lab Results  Component Value Date   INSULIN 18.6 12/09/2016   CBC    Component Value Date/Time   WBC 4.1 12/09/2016 1145   WBC 4.4 09/03/2016 0904   RBC 4.73 12/09/2016 1145   RBC 4.80 09/03/2016 0904   HGB 14.3 12/09/2016 1145   HCT 41.4 12/09/2016 1145   PLT 223.0 09/03/2016 0904   MCV 88 12/09/2016 1145   MCH 30.2 12/09/2016 1145   MCH 30.9 07/26/2015 1449   MCHC 34.5 12/09/2016 1145   MCHC 35.3 09/03/2016 0904   RDW 15.9 (H) 12/09/2016 1145   LYMPHSABS 2.4 12/09/2016 1145   MONOABS 0.5 09/03/2016 0904   EOSABS 0.1 12/09/2016 1145   BASOSABS 0.0 12/09/2016 1145   Iron/TIBC/Ferritin/ %Sat No results found for: IRON, TIBC, FERRITIN, IRONPCTSAT Lipid  Panel     Component Value Date/Time   CHOL 261 (H) 12/09/2016 1145   TRIG 239 (H) 12/09/2016 1145   TRIG 221 11/14/2010   TRIG 221 11/14/2010   HDL 55 12/09/2016 1145   CHOLHDL 6 10/16/2015 1611   VLDL 55.0 (H) 10/16/2015 1611   LDLCALC 158 (H) 12/09/2016 1145   LDLDIRECT 179.0 10/16/2015 1611   Hepatic Function Panel     Component Value Date/Time   PROT 7.8 12/09/2016 1145  ALBUMIN 4.6 12/09/2016 1145   AST 13 12/09/2016 1145   ALT 30 12/09/2016 1145   ALKPHOS 73 12/09/2016 1145   BILITOT 0.5 12/09/2016 1145   BILIDIR 0.1 08/02/2014 0931   IBILI 0.6 01/16/2007 0123      Component Value Date/Time   TSH 0.533 12/09/2016 1145   TSH 0.41 09/03/2016 0904   TSH 0.41 10/16/2015 1611    ASSESSMENT AND PLAN: Other hyperlipidemia  Vitamin D deficiency - Plan: Vitamin D, Ergocalciferol, (DRISDOL) 50000 units CAPS capsule  Type 2 diabetes mellitus without complication, without long-term current use of insulin (HCC)  Class 2 obesity with serious comorbidity and body mass index (BMI) of 36.0 to 36.9 in adult, unspecified obesity type  PLAN:  Vitamin D Deficiency [name] was informed that low vitamin D levels contributes to fatigue and are associated with obesity, breast, and colon cancer. She agrees to start to take prescription Vit D @50 ,000 IU every week #4 with no refills and will follow up for routine testing of vitamin D, at least 2-3 times per year. She was informed of the risk of over-replacement of vitamin D and agrees to not increase her dose unless he discusses this with [place] first. Cera agrees to follow up with our clinic in 2 weeks.  Diabetes II [name] has been given extensive diabetes education by myself today including ideal fasting and post-prandial blood glucose readings, individual ideal Hgb A1c goals  and hypoglycemia prevention. We discussed the importance of good blood sugar control to decrease the likelihood of diabetic complications such as nephropathy,  neuropathy, limb loss, blindness, coronary artery disease, and death. We discussed the importance of intensive lifestyle modification including diet, exercise and weight loss as the first line treatment for diabetes. [name] agrees to continue with diet, exercise and weight loss efforts. [name] declines metformin for now. We will re-check labs in 3 months and she will follow up at the agreed upon time.  Hyperlipidemia [name] was informed of the American Heart Association Guidelines emphasizing intensive lifestyle modifications as the first line treatment for hyperlipidemia. We discussed many lifestyle modifications today in depth, and [name] will continue to work on decreasing saturated fats such as fatty red meat, butter and many fried foods. She will also increase vegetables and lean protein in her diet and continue to work on exercise and weight loss efforts. We will re-check labs in 3 months and [name] agrees to follow up with our clinic in 2 weeks.  Obesity [name] is currently in the action stage of change. As such, her goal is to continue with weight loss efforts She has agreed to follow the Category 2 plan with adjustments to increase protein but decrease meat [name] has been instructed to work up to a goal of 150 minutes of combined cardio and strengthening exercise per week for weight loss and overall health benefits. We discussed the following Behavioral Modification Strategies today: increasing lean protein intake, decreasing simple carbohydrates  and increasing vegetables  [name] has agreed to follow up with our clinic in 2 weeks. She was informed of the importance of frequent follow up visits to maximize her success with intensive lifestyle modifications for her multiple health conditions.  I, Doreene Nest, am acting as transcriptionist for Dennard Nip, MD  I have reviewed the above documentation for accuracy and completeness, and I agree with the above. -Dennard Nip, MD  OBESITY  BEHAVIORAL INTERVENTION VISIT  Today's visit was # 2 out of 25.  Starting weight: 239 lbs Starting date: 12/09/16 Today's  weight : 238 lbs Today's date: 12/24/2016 Total lbs lost to date: 1 (Patients must lose 7 lbs in the first 6 months to continue with counseling)   ASK: We discussed the diagnosis of obesity with [name] today and [name] agreed to give [place] permission to discuss obesity behavioral modification therapy today.  ASSESS: [name] has the diagnosis of obesity and her BMI today is 36.3 [name] is in the action stage of change   ADVISE: [name] was educated on the multiple health risks of obesity as well as the benefit of weight loss to improve her health. She was advised of the need for long term treatment and the importance of lifestyle modifications.  AGREE: Multiple dietary modification options and treatment options were discussed and  [name] agreed to follow the Category 2 plan with adjustments to increase protein but decrease meat We discussed the following Behavioral Modification Strategies today: increasing lean protein intake, decreasing simple carbohydrates  and increasing vegetables

## 2016-12-27 ENCOUNTER — Encounter: Payer: Self-pay | Admitting: Gastroenterology

## 2016-12-27 ENCOUNTER — Ambulatory Visit (AMBULATORY_SURGERY_CENTER): Payer: BLUE CROSS/BLUE SHIELD | Admitting: Gastroenterology

## 2016-12-27 VITALS — BP 122/58 | HR 94 | Temp 97.3°F | Resp 18 | Ht 67.0 in | Wt 240.0 lb

## 2016-12-27 DIAGNOSIS — K635 Polyp of colon: Secondary | ICD-10-CM | POA: Diagnosis not present

## 2016-12-27 DIAGNOSIS — Z1211 Encounter for screening for malignant neoplasm of colon: Secondary | ICD-10-CM | POA: Diagnosis present

## 2016-12-27 DIAGNOSIS — K2100 Gastro-esophageal reflux disease with esophagitis, without bleeding: Secondary | ICD-10-CM

## 2016-12-27 DIAGNOSIS — Z800 Family history of malignant neoplasm of digestive organs: Secondary | ICD-10-CM | POA: Diagnosis not present

## 2016-12-27 DIAGNOSIS — D125 Benign neoplasm of sigmoid colon: Secondary | ICD-10-CM | POA: Diagnosis not present

## 2016-12-27 DIAGNOSIS — Z1212 Encounter for screening for malignant neoplasm of rectum: Secondary | ICD-10-CM

## 2016-12-27 DIAGNOSIS — R1013 Epigastric pain: Secondary | ICD-10-CM

## 2016-12-27 DIAGNOSIS — K317 Polyp of stomach and duodenum: Secondary | ICD-10-CM | POA: Diagnosis not present

## 2016-12-27 DIAGNOSIS — Z86010 Personal history of colonic polyps: Secondary | ICD-10-CM | POA: Diagnosis not present

## 2016-12-27 DIAGNOSIS — K209 Esophagitis, unspecified without bleeding: Secondary | ICD-10-CM

## 2016-12-27 MED ORDER — SODIUM CHLORIDE 0.9 % IV SOLN: 500.0000 mL | INTRAVENOUS | Status: AC

## 2016-12-27 MED ORDER — PANTOPRAZOLE SODIUM 40 MG PO TBEC: 40.0000 mg | DELAYED_RELEASE_TABLET | Freq: Two times a day (BID) | ORAL | 11 refills | Status: DC

## 2016-12-27 NOTE — Progress Notes (Signed)
Called to room to assist during endoscopic procedure.  Patient ID and intended procedure confirmed with present staff. Received instructions for my participation in the procedure from the performing physician.  

## 2016-12-27 NOTE — Patient Instructions (Signed)
YOU HAD AN ENDOSCOPIC PROCEDURE TODAY AT [facility]:   Refer to the procedure report that was given to you for any specific questions about what was found during the examination.  If the procedure report does not answer your questions, please call your gastroenterologist to clarify.  If you requested that your care partner not be given the details of your procedure findings, then the procedure report has been included in a sealed envelope for you to review at your convenience later.  YOU SHOULD EXPECT: Some feelings of bloating in the abdomen. Passage of more gas than usual.  Walking can help get rid of the air that was put into your GI tract during the procedure and reduce the bloating. If you had a lower endoscopy (such as a colonoscopy or flexible sigmoidoscopy) you may notice spotting of blood in your stool or on the toilet paper. If you underwent a bowel prep for your procedure, you may not have a normal bowel movement for a few days.  Please Note:  You might notice some irritation and congestion in your nose or some drainage.  This is from the oxygen used during your procedure.  There is no need for concern and it should clear up in a day or so.  SYMPTOMS TO REPORT IMMEDIATELY:   Following lower endoscopy (colonoscopy or flexible sigmoidoscopy):  Excessive amounts of blood in the stool  Significant tenderness or worsening of abdominal pains  Swelling of the abdomen that is new, acute  Fever of 100F or higher   Following upper endoscopy (EGD)  Vomiting of blood or coffee ground material  New chest pain or pain under the shoulder blades  Painful or persistently difficult swallowing  New shortness of breath  Fever of 100F or higher  Black, tarry-looking stools  For urgent or emergent issues, a gastroenterologist can be reached at any hour by calling [phone].   DIET:  We do recommend a small meal at first, but then you may proceed to your regular diet.  Drink  plenty of fluids but you should avoid alcoholic beverages for 24 hours.  ACTIVITY:  You should plan to take it easy for the rest of today and you should NOT DRIVE or use heavy machinery until tomorrow (because of the sedation medicines used during the test).    FOLLOW UP: Our staff will call the number listed on your records the next business day following your procedure to check on you and address any questions or concerns that you may have regarding the information given to you following your procedure. If we do not reach you, we will leave a message.  However, if you are feeling well and you are not experiencing any problems, there is no need to return our call.  We will assume that you have returned to your regular daily activities without incident.  If any biopsies were taken you will be contacted by phone or by letter within the next 1-3 weeks.  Please call us at [phone] if you have not heard about the biopsies in 3 weeks.    SIGNATURES/CONFIDENTIALITY: You and/or your care partner have signed paperwork which will be entered into your electronic medical record.  These signatures attest to the fact that that the information above on your After Visit Summary has been reviewed and is understood.  Full responsibility of the confidentiality of this discharge information lies with you and/or your care-partner.    Information on colon polyps given to you today  Await pathology results on colon polyps that were removed and on biopsies of gastric polyps  Information on esophagitis given to you today  Order for Protonix sent to your pharmacy for pick up

## 2016-12-27 NOTE — Op Note (Signed)
[facility] Patient Name: [name] Procedure Date: 12/27/2016 2:04 PM MRN: [id number] Endoscopist: Ladene Artist , MD Age: 61 Referring MD:  Date of Birth: Jul 17, 1955 Gender: Female Account #: [id number] Procedure:                Upper GI endoscopy Indications:              Epigastric abdominal pain, Gastro-esophageal reflux                            disease Medicines:                Monitored Anesthesia Care Procedure:                Pre-Anesthesia Assessment:                           - Prior to the procedure, a History and Physical                            was performed, and patient medications and                            allergies were reviewed. The patient's tolerance of                            previous anesthesia was also reviewed. The risks                            and benefits of the procedure and the sedation                            options and risks were discussed with the patient.                            All questions were answered, and informed consent                            was obtained. Prior Anticoagulants: The patient has                            taken no previous anticoagulant or antiplatelet                            agents. ASA Grade Assessment: II - A patient with                            mild systemic disease. After reviewing the risks                            and benefits, the patient was deemed in                            satisfactory condition to undergo the procedure.  After obtaining informed consent, the endoscope was                            passed under direct vision. Throughout the                            procedure, the patient's blood pressure, pulse, and                            oxygen saturations were monitored continuously. The                            Model GIF-[id number]) scope was introduced                            through the mouth, and advanced to the  second part                            of duodenum. The upper GI endoscopy was                            accomplished without difficulty. The patient                            tolerated the procedure well. Scope In: Scope Out: Findings:                 LA Grade B (one or more mucosal breaks greater than                            5 mm, not extending between the tops of two mucosal                            folds) esophagitis with no bleeding was found in                            the distal esophagus.                           The exam of the esophagus was otherwise normal.                           Multiple 3 to 4 mm sessile polyps with no bleeding                            and no stigmata of recent bleeding were found in                            the gastric fundus and in the gastric body.                            Biopsies were taken with a cold forceps for  histology.                           The exam of the stomach was otherwise normal.                           The duodenal bulb and second portion of the                            duodenum were normal. Complications:            No immediate complications. Estimated Blood Loss:     Estimated blood loss was minimal. Impression:               - LA Grade B reflux esophagitis.                           - Multiple gastric polyps. Biopsied.                           - Normal duodenal bulb and second portion of the                            duodenum. Recommendation:           - Patient has a contact number available for                            emergencies. The signs and symptoms of potential                            delayed complications were discussed with the                            patient. Return to normal activities tomorrow.                            Written discharge instructions were provided to the                            patient.                           - Resume previous diet.  Antireflux measures.                           - Continue present medications.                           - Await pathology results.                           - Protonix (pantoprazole) 40 mg PO bid, 1 year of                            refills.                           -  Return to GI office in 6 weeks. Ladene Artist, MD 12/27/2016 2:41:44 PM This report has been signed electronically.

## 2016-12-27 NOTE — Progress Notes (Signed)
To PACU VSS. Report to [email or web address] 

## 2016-12-27 NOTE — Op Note (Signed)
[facility] Patient Name: [name] Procedure Date: 12/27/2016 2:04 PM MRN: [id number] Endoscopist: Ladene Artist , MD Age: 61 Referring MD:  Date of Birth: [birth year] Gender: Female Account #: [id number] Procedure:                Colonoscopy Indications:              Screening in patient at increased risk: Family                            history of 1st-degree relative with colorectal                            cancer Medicines:                Monitored Anesthesia Care Procedure:                Pre-Anesthesia Assessment:                           - Prior to the procedure, a History and Physical                            was performed, and patient medications and                            allergies were reviewed. The patient's tolerance of                            previous anesthesia was also reviewed. The risks                            and benefits of the procedure and the sedation                            options and risks were discussed with the patient.                            All questions were answered, and informed consent                            was obtained. Prior Anticoagulants: The patient has                            taken no previous anticoagulant or antiplatelet                            agents. ASA Grade Assessment: II - A patient with                            mild systemic disease. After reviewing the risks                            and benefits, the patient was deemed in  satisfactory condition to undergo the procedure.                           After obtaining informed consent, the colonoscope                            was passed under direct vision. Throughout the                            procedure, the patient's blood pressure, pulse, and                            oxygen saturations were monitored continuously. The                            Model [id number]-[id number]) scope was introduced                       through the anus and advanced to the the cecum,                            identified by appendiceal orifice and ileocecal                            valve. The ileocecal valve, appendiceal orifice,                            and rectum were photographed. The quality of the                            bowel preparation was good. The colonoscopy was                            performed without difficulty. The patient tolerated                            the procedure well. Scope In: 2:11:04 PM Scope Out: 2:27:25 PM Scope Withdrawal Time: 0 hours 10 minutes 46 seconds  Total Procedure Duration: 0 hours 16 minutes 21 seconds  Findings:                 The perianal and digital rectal examinations were                            normal.                           A 5 mm polyp was found in the sigmoid colon. The                            polyp was sessile. The polyp was removed with a                            cold snare. Resection and retrieval were complete.  The exam was otherwise without abnormality on                            direct views. Unable to retroflex due to a narrow                            rectal vault Complications:            No immediate complications. Estimated blood loss:                            None. Estimated Blood Loss:     Estimated blood loss: none. Impression:               - One 5 mm polyp in the sigmoid colon, removed with                            a cold snare. Resected and retrieved.                           - The examination was otherwise normal on direct                            views. Recommendation:           - Repeat colonoscopy in 5 years for screening                            purposes.                           - Patient has a contact number available for                            emergencies. The signs and symptoms of potential                            delayed complications were discussed with  the                            patient. Return to normal activities tomorrow.                            Written discharge instructions were provided to the                            patient.                           - Resume previous diet.                           - Continue present medications.                           - Await pathology results. Ladene Artist, MD 12/27/2016 2:30:43 PM This report has been signed electronically.

## 2016-12-30 ENCOUNTER — Telehealth: Payer: Self-pay

## 2016-12-30 NOTE — Telephone Encounter (Signed)
  Follow up Call-  Call back number 12/27/2016  Post procedure Call Back phone  # [phone]  Permission to leave phone message Yes  Some recent data might be hidden     Patient questions:  Do you have a fever, pain , or abdominal swelling? No. Pain Score  0 *  Have you tolerated food without any problems? Yes.    Have you been able to return to your normal activities? Yes.    Do you have any questions about your discharge instructions: Diet   No. Medications  No. Follow up visit  No.  Do you have questions or concerns about your Care? No.  Actions: * If pain score is 4 or above: No action needed, pain <4.

## 2017-01-07 ENCOUNTER — Ambulatory Visit (INDEPENDENT_AMBULATORY_CARE_PROVIDER_SITE_OTHER): Payer: BLUE CROSS/BLUE SHIELD | Admitting: Family Medicine

## 2017-01-07 VITALS — BP 138/83 | HR 75 | Temp 97.9°F | Ht 67.0 in | Wt 237.0 lb

## 2017-01-07 DIAGNOSIS — R7303 Prediabetes: Secondary | ICD-10-CM | POA: Diagnosis not present

## 2017-01-07 DIAGNOSIS — E669 Obesity, unspecified: Secondary | ICD-10-CM | POA: Diagnosis not present

## 2017-01-07 DIAGNOSIS — Z6837 Body mass index (BMI) 37.0-37.9, adult: Secondary | ICD-10-CM | POA: Diagnosis not present

## 2017-01-07 DIAGNOSIS — IMO0001 Reserved for inherently not codable concepts without codable children: Secondary | ICD-10-CM

## 2017-01-07 MED ORDER — METFORMIN HCL 500 MG PO TABS: 500.0000 mg | ORAL_TABLET | Freq: Every day | ORAL | 0 refills | Status: DC

## 2017-01-08 NOTE — Progress Notes (Signed)
Office: [phone]  /  Fax: [phone]   HPI:   Chief Complaint: OBESITY [name] is here to discuss her progress with her obesity treatment plan. She is on the  follow the Category 2 plan and is following her eating plan approximately 90 % of the time. She states she is walking, lifting, moving for 2 to 3 times per week. [name] continues to lose weight on the category 2 plan. She is still struggling with emotional eating and excessive hunger. She is doing better with planning meals ahead of time. Her weight is 237 lb (107.5 kg) today and has had a weight loss of 1 pound over a period of 2 weeks since her last visit. She has lost 2 lbs since starting treatment with [place].  Pre-Diabetes [name] has a diagnosis of pre-diabetes based on her elevated Hgb A1c and was informed this puts her at greater risk of developing diabetes. She is not taking metformin currently and continues to work on diet and exercise to decrease risk of diabetes. She is doing well on her diet prescription. She notes increased polyphagia, especially in the pm and denies nausea or hypoglycemia.    ALLERGIES: Allergies  Allergen Reactions  . Crestor [Rosuvastatin Calcium]     Muscle aches    MEDICATIONS: Current Outpatient Prescriptions on File Prior to Visit  Medication Sig Dispense Refill  . hydrochlorothiazide (HYDRODIURIL) 25 MG tablet TAKE 1 TABLET BY MOUTH DAILY. 90 tablet 1  . LYRICA 75 MG capsule TAKE ONE CAPSULE EVERY DAY AT BEDTIME 30 capsule 3  . pantoprazole (PROTONIX) 40 MG tablet Take 1 tablet (40 mg total) by mouth 2 (two) times daily. 60 tablet 11  . Pitavastatin Calcium (LIVALO) 4 MG TABS Take 1 tablet (4 mg total) by mouth daily. 90 tablet 3  . Probiotic Product (PROBIOTIC PO) 1 tablet daily.     . ranitidine (ZANTAC) 150 MG tablet Take 1 tablet (150 mg total) by mouth 2 (two) times daily. 60 tablet 6  . Vitamin D, Ergocalciferol, (DRISDOL) 50000 units CAPS capsule Take 1 capsule (50,000 Units total)  by mouth every 7 (seven) days. 4 capsule 0   Current Facility-Administered Medications on File Prior to Visit  Medication Dose Route Frequency Provider Last Rate Last Dose  . 0.9 %  sodium chloride infusion  500 mL Intravenous Continuous Ladene Artist, MD        PAST MEDICAL HISTORY: Past Medical History:  Diagnosis Date  . Arthritis   . Back pain   . Benign fundic gland polyps of stomach   . Constipation   . Diabetes mellitus without complication ([place])   . Esophagitis    LA Class B  . Fatigue   . Fatty liver   . Fatty liver   . Fibromyalgia   . Gallbladder problem   . GERD (gastroesophageal reflux disease)   . Heart murmur   . Hyperlipidemia   . Hypertension   . IBS (irritable bowel syndrome)   . Joint pain   . Neuromuscular disorder ([place])   . Prediabetes     PAST SURGICAL HISTORY: Past Surgical History:  Procedure Laterality Date  . CHOLECYSTECTOMY  2009   low GB EF, no gallstones  . COLONOSCOPY    . LAPAROSCOPY     of adhesions/ of ovaries  . UPPER GASTROINTESTINAL ENDOSCOPY    . VAGINAL HYSTERECTOMY      SOCIAL HISTORY: Social History  Substance Use Topics  . Smoking status: Former Smoker    Packs/day: 0.10  Years: 10.00    Types: Cigarettes    Quit date: 07/14/1981  . Smokeless tobacco: Never Used  . Alcohol use No    FAMILY HISTORY: Family History  Problem Relation Age of Onset  . Colon cancer Father   . Hypertension Father   . Hyperlipidemia Father   . Obesity Father   . Heart disease Mother   . Hypertension Mother   . Hyperlipidemia Mother   . Obesity Mother   . Diabetes Brother        Half  . Heart disease Brother   . Diabetes Sister   . Breast cancer Paternal Grandmother   . Esophageal cancer Neg Hx   . Stomach cancer Neg Hx   . Rectal cancer Neg Hx     ROS: Review of Systems  Constitutional: Positive for weight loss.  Gastrointestinal: Negative for nausea.  Endo/Heme/Allergies:       Polyphagia Negative hypoglycemia     PHYSICAL EXAM: Blood pressure 138/83, pulse 75, temperature 97.9 F (36.6 C), temperature source Oral, height 5\' 7"  (1.702 m), weight 237 lb (107.5 kg), SpO2 97 %. Body mass index is 37.12 kg/m. Physical Exam  Constitutional: She is oriented to person, place, and time. She appears well-developed and well-nourished.  Cardiovascular: Normal rate.   Pulmonary/Chest: Effort normal.  Musculoskeletal: Normal range of motion.  Neurological: She is oriented to person, place, and time.  Skin: Skin is warm and dry.  Psychiatric: She has a normal mood and affect. Her behavior is normal.  Vitals reviewed.   RECENT LABS AND TESTS: BMET    Component Value Date/Time   NA 139 12/09/2016 1145   K 3.9 12/09/2016 1145   CL 98 12/09/2016 1145   CO2 23 12/09/2016 1145   GLUCOSE 103 (H) 12/09/2016 1145   GLUCOSE 106 (H) 11/05/2016 1458   BUN 11 12/09/2016 1145   CREATININE 0.70 12/09/2016 1145   CREATININE 0.76 07/26/2015 1500   CALCIUM 9.9 12/09/2016 1145   GFRNONAA 93 12/09/2016 1145   GFRAA 107 12/09/2016 1145   Lab Results  Component Value Date   HGBA1C 6.4 (H) 12/09/2016   HGBA1C 5.8 09/03/2016   HGBA1C 6.2 11/20/2015   HGBA1C 6.2 10/16/2015   HGBA1C 6.9 07/26/2015   Lab Results  Component Value Date   INSULIN 18.6 12/09/2016   CBC    Component Value Date/Time   WBC 4.1 12/09/2016 1145   WBC 4.4 09/03/2016 0904   RBC 4.73 12/09/2016 1145   RBC 4.80 09/03/2016 0904   HGB 14.3 12/09/2016 1145   HCT 41.4 12/09/2016 1145   PLT 223.0 09/03/2016 0904   MCV 88 12/09/2016 1145   MCH 30.2 12/09/2016 1145   MCH 30.9 07/26/2015 1449   MCHC 34.5 12/09/2016 1145   MCHC 35.3 09/03/2016 0904   RDW 15.9 (H) 12/09/2016 1145   LYMPHSABS 2.4 12/09/2016 1145   MONOABS 0.5 09/03/2016 0904   EOSABS 0.1 12/09/2016 1145   BASOSABS 0.0 12/09/2016 1145   Iron/TIBC/Ferritin/ %Sat No results found for: IRON, TIBC, FERRITIN, IRONPCTSAT Lipid Panel     Component Value Date/Time   CHOL  261 (H) 12/09/2016 1145   TRIG 239 (H) 12/09/2016 1145   TRIG 221 11/14/2010   TRIG 221 11/14/2010   HDL 55 12/09/2016 1145   CHOLHDL 6 10/16/2015 1611   VLDL 55.0 (H) 10/16/2015 1611   LDLCALC 158 (H) 12/09/2016 1145   LDLDIRECT 179.0 10/16/2015 1611   Hepatic Function Panel     Component Value Date/Time  PROT 7.8 12/09/2016 1145   ALBUMIN 4.6 12/09/2016 1145   AST 13 12/09/2016 1145   ALT 30 12/09/2016 1145   ALKPHOS 73 12/09/2016 1145   BILITOT 0.5 12/09/2016 1145   BILIDIR 0.1 08/02/2014 0931   IBILI 0.6 01/16/2007 0123      Component Value Date/Time   TSH 0.533 12/09/2016 1145   TSH 0.41 09/03/2016 0904   TSH 0.41 10/16/2015 1611    ASSESSMENT AND PLAN: Prediabetes - Plan: metFORMIN (GLUCOPHAGE) 500 MG tablet  Class 2 obesity with serious comorbidity and body mass index (BMI) of 37.0 to 37.9 in adult, unspecified obesity type  PLAN:  Pre-Diabetes [name] will continue to work on weight loss, exercise, and decreasing simple carbohydrates in her diet to help decrease the risk of diabetes. We dicussed metformin including benefits and risks. She was informed that eating too many simple carbohydrates or too many calories at one sitting increases the likelihood of GI side effects. [name] agrees to start metformin for now and a prescription was written today for metformin 500 mg every morning #30 with no refills. [name] agreed to follow up with [place] as directed to monitor her progress.  Obesity [name] is currently in the action stage of change. As such, her goal is to continue with weight loss efforts She has agreed to follow the Category 2 plan [name] has been instructed to work up to a goal of 150 minutes of combined cardio and strengthening exercise per week for weight loss and overall health benefits. We discussed the following Behavioral Modification Strategies today: increasing lean protein intake and decreasing simple carbohydrates   [name] has agreed to follow up with our  clinic in 2 weeks. She was informed of the importance of frequent follow up visits to maximize her success with intensive lifestyle modifications for her multiple health conditions.  I, Doreene Nest, am acting as transcriptionist for Dennard Nip, MD  I have reviewed the above documentation for accuracy and completeness, and I agree with the above. -Dennard Nip, MD   OBESITY BEHAVIORAL INTERVENTION VISIT  Today's visit was # 3 out of 22.  Starting weight: 239 lbs Starting date: 12/09/16 Today's weight : 237 lbs Today's date: 01/07/2017 Total lbs lost to date: 2 (Patients must lose 7 lbs in the first 6 months to continue with counseling)   ASK: We discussed the diagnosis of obesity with [name] today and [name] agreed to give [place] permission to discuss obesity behavioral modification therapy today.  ASSESS: [name] has the diagnosis of obesity and her BMI today is 37.2 [name] is in the action stage of change   ADVISE: [name] was educated on the multiple health risks of obesity as well as the benefit of weight loss to improve her health. She was advised of the need for long term treatment and the importance of lifestyle modifications.  AGREE: Multiple dietary modification options and treatment options were discussed and  [name] agreed to follow the Category 2 plan We discussed the following Behavioral Modification Strategies today: increasing lean protein intake and decreasing simple carbohydrates

## 2017-01-12 ENCOUNTER — Encounter: Payer: Self-pay | Admitting: Gastroenterology

## 2017-01-14 LAB — HM COLONOSCOPY

## 2017-01-23 ENCOUNTER — Ambulatory Visit (INDEPENDENT_AMBULATORY_CARE_PROVIDER_SITE_OTHER): Payer: BLUE CROSS/BLUE SHIELD | Admitting: Family Medicine

## 2017-01-23 VITALS — BP 131/81 | HR 71 | Temp 97.9°F | Ht 67.0 in | Wt 237.0 lb

## 2017-01-23 DIAGNOSIS — R7303 Prediabetes: Secondary | ICD-10-CM

## 2017-01-23 DIAGNOSIS — IMO0001 Reserved for inherently not codable concepts without codable children: Secondary | ICD-10-CM

## 2017-01-23 DIAGNOSIS — E669 Obesity, unspecified: Secondary | ICD-10-CM

## 2017-01-23 DIAGNOSIS — E559 Vitamin D deficiency, unspecified: Secondary | ICD-10-CM

## 2017-01-23 DIAGNOSIS — Z6837 Body mass index (BMI) 37.0-37.9, adult: Secondary | ICD-10-CM

## 2017-01-23 MED ORDER — VITAMIN D (ERGOCALCIFEROL) 1.25 MG (50000 UNIT) PO CAPS: 50000.0000 [IU] | ORAL_CAPSULE | ORAL | 0 refills | Status: DC

## 2017-01-23 NOTE — Progress Notes (Signed)
Office: [phone]  /  Fax: [phone]   HPI:   Chief Complaint: OBESITY [name] is here to discuss her progress with her obesity treatment plan. She is on the Category 2 plan and is following her eating plan approximately 60 % of the time. She states she is exercising 0 minutes 0 times per week. [name] has done well maintaining her weight but she hasn't been able to concentrate on weight loss with increased stress at home and at work. She is ready to try to follow the plan more strictly now. Her weight is 237 lb (107.5 kg) today and has maintained weight over a period 2 weeks since her last visit. She has lost 2 lbs since starting treatment with [place].  Vitamin D deficiency [name] has a diagnosis of vitamin D deficiency. She is currently stable on vit D but not yet at goal and still notes fatigue. [name] denies nausea, vomiting or muscle weakness.  Pre-Diabetes [name] has a diagnosis of pre-diabetes based on her elevated Hgb A1c and was informed this puts her at greater risk of developing diabetes. She started taking metformin and did well for the first 4 days then had GI upset so she stopped. This coincided with her increasing simple carbohydrates in her diet. She continues to work on diet and exercise to decrease risk of diabetes. She denies hypoglycemia.   ALLERGIES: Allergies  Allergen Reactions  . Crestor [Rosuvastatin Calcium]     Muscle aches    MEDICATIONS: Current Outpatient Prescriptions on File Prior to Visit  Medication Sig Dispense Refill  . hydrochlorothiazide (HYDRODIURIL) 25 MG tablet TAKE 1 TABLET BY MOUTH DAILY. 90 tablet 1  . LYRICA 75 MG capsule TAKE ONE CAPSULE EVERY DAY AT BEDTIME 30 capsule 3  . metFORMIN (GLUCOPHAGE) 500 MG tablet Take 1 tablet (500 mg total) by mouth daily with breakfast. (Patient taking differently: Take 500 mg by mouth daily with breakfast. Take 1/2 tab QHS) 30 tablet 0  . pantoprazole (PROTONIX) 40 MG tablet Take 1 tablet (40 mg total) by  mouth 2 (two) times daily. 60 tablet 11  . Pitavastatin Calcium (LIVALO) 4 MG TABS Take 1 tablet (4 mg total) by mouth daily. 90 tablet 3  . Probiotic Product (PROBIOTIC PO) 1 tablet daily.     . ranitidine (ZANTAC) 150 MG tablet Take 1 tablet (150 mg total) by mouth 2 (two) times daily. 60 tablet 6   Current Facility-Administered Medications on File Prior to Visit  Medication Dose Route Frequency Provider Last Rate Last Dose  . 0.9 %  sodium chloride infusion  500 mL Intravenous Continuous Ladene Artist, MD        PAST MEDICAL HISTORY: Past Medical History:  Diagnosis Date  . Arthritis   . Back pain   . Benign fundic gland polyps of stomach   . Constipation   . Diabetes mellitus without complication ([place])   . Esophagitis    LA Class B  . Fatigue   . Fatty liver   . Fatty liver   . Fibromyalgia   . Gallbladder problem   . GERD (gastroesophageal reflux disease)   . Heart murmur   . Hyperlipidemia   . Hypertension   . IBS (irritable bowel syndrome)   . Joint pain   . Neuromuscular disorder ([place])   . Prediabetes     PAST SURGICAL HISTORY: Past Surgical History:  Procedure Laterality Date  . CHOLECYSTECTOMY  2009   low GB EF, no gallstones  . COLONOSCOPY    .  LAPAROSCOPY     of adhesions/ of ovaries  . UPPER GASTROINTESTINAL ENDOSCOPY    . VAGINAL HYSTERECTOMY      SOCIAL HISTORY: Social History  Substance Use Topics  . Smoking status: Former Smoker    Packs/day: 0.10    Years: 10.00    Types: Cigarettes    Quit date: 07/14/1981  . Smokeless tobacco: Never Used  . Alcohol use No    FAMILY HISTORY: Family History  Problem Relation Age of Onset  . Colon cancer Father   . Hypertension Father   . Hyperlipidemia Father   . Obesity Father   . Heart disease Mother   . Hypertension Mother   . Hyperlipidemia Mother   . Obesity Mother   . Diabetes Brother        Half  . Heart disease Brother   . Diabetes Sister   . Breast cancer Paternal Grandmother   .  Esophageal cancer Neg Hx   . Stomach cancer Neg Hx   . Rectal cancer Neg Hx     ROS: Review of Systems  Constitutional: Positive for malaise/fatigue. Negative for weight loss.  Gastrointestinal: Negative for nausea and vomiting.  Musculoskeletal:       Negative muscle weakness  Endo/Heme/Allergies:       Negative hypoglycemia    PHYSICAL EXAM: Blood pressure 131/81, pulse 71, temperature 97.9 F (36.6 C), temperature source Oral, height 5\' 7"  (1.702 m), weight 237 lb (107.5 kg), SpO2 100 %. Body mass index is 37.12 kg/m. Physical Exam  Constitutional: She is oriented to person, place, and time. She appears well-developed and well-nourished.  Cardiovascular: Normal rate.   Pulmonary/Chest: Effort normal.  Musculoskeletal: Normal range of motion.  Neurological: She is oriented to person, place, and time.  Skin: Skin is warm and dry.  Psychiatric: She has a normal mood and affect. Her behavior is normal.  Vitals reviewed.   RECENT LABS AND TESTS: BMET    Component Value Date/Time   NA 139 12/09/2016 1145   K 3.9 12/09/2016 1145   CL 98 12/09/2016 1145   CO2 23 12/09/2016 1145   GLUCOSE 103 (H) 12/09/2016 1145   GLUCOSE 106 (H) 11/05/2016 1458   BUN 11 12/09/2016 1145   CREATININE 0.70 12/09/2016 1145   CREATININE 0.76 07/26/2015 1500   CALCIUM 9.9 12/09/2016 1145   GFRNONAA 93 12/09/2016 1145   GFRAA 107 12/09/2016 1145   Lab Results  Component Value Date   HGBA1C 6.4 (H) 12/09/2016   HGBA1C 5.8 09/03/2016   HGBA1C 6.2 11/20/2015   HGBA1C 6.2 10/16/2015   HGBA1C 6.9 07/26/2015   Lab Results  Component Value Date   INSULIN 18.6 12/09/2016   CBC    Component Value Date/Time   WBC 4.1 12/09/2016 1145   WBC 4.4 09/03/2016 0904   RBC 4.73 12/09/2016 1145   RBC 4.80 09/03/2016 0904   HGB 14.3 12/09/2016 1145   HCT 41.4 12/09/2016 1145   PLT 223.0 09/03/2016 0904   MCV 88 12/09/2016 1145   MCH 30.2 12/09/2016 1145   MCH 30.9 07/26/2015 1449   MCHC 34.5  12/09/2016 1145   MCHC 35.3 09/03/2016 0904   RDW 15.9 (H) 12/09/2016 1145   LYMPHSABS 2.4 12/09/2016 1145   MONOABS 0.5 09/03/2016 0904   EOSABS 0.1 12/09/2016 1145   BASOSABS 0.0 12/09/2016 1145   Iron/TIBC/Ferritin/ %Sat No results found for: IRON, TIBC, FERRITIN, IRONPCTSAT Lipid Panel     Component Value Date/Time   CHOL 261 (H) 12/09/2016 1145  TRIG 239 (H) 12/09/2016 1145   TRIG 221 11/14/2010   TRIG 221 11/14/2010   HDL 55 12/09/2016 1145   CHOLHDL 6 10/16/2015 1611   VLDL 55.0 (H) 10/16/2015 1611   LDLCALC 158 (H) 12/09/2016 1145   LDLDIRECT 179.0 10/16/2015 1611   Hepatic Function Panel     Component Value Date/Time   PROT 7.8 12/09/2016 1145   ALBUMIN 4.6 12/09/2016 1145   AST 13 12/09/2016 1145   ALT 30 12/09/2016 1145   ALKPHOS 73 12/09/2016 1145   BILITOT 0.5 12/09/2016 1145   BILIDIR 0.1 08/02/2014 0931   IBILI 0.6 01/16/2007 0123      Component Value Date/Time   TSH 0.533 12/09/2016 1145   TSH 0.41 09/03/2016 0904   TSH 0.41 10/16/2015 1611    ASSESSMENT AND PLAN: Prediabetes  Vitamin D deficiency - Plan: Vitamin D, Ergocalciferol, (DRISDOL) 50000 units CAPS capsule  Class 2 obesity with serious comorbidity and body mass index (BMI) of 37.0 to 37.9 in adult, unspecified obesity type  PLAN:  Vitamin D Deficiency [name] was informed that low vitamin D levels contributes to fatigue and are associated with obesity, breast, and colon cancer. She agrees to continue to take prescription Vit D @50 ,000 IU every week, we will refill for 1 month and will follow up for routine testing of vitamin D, at least 2-3 times per year. She was informed of the risk of over-replacement of vitamin D and agrees to not increase her dose unless he discusses this with [place] first. [name] agrees to follow up with our clinic in 2 to 3 weeks.  Pre-Diabetes [name] will continue to work on weight loss, exercise, and decreasing simple carbohydrates in her diet to help decrease the  risk of diabetes. We dicussed metformin including benefits and risks. She was informed that eating too many simple carbohydrates or too many calories at one sitting increases the likelihood of GI side effects. [name] agrees to change metformin to 1/2 pill qhs and a prescription was not written today. [name] agreed to follow up with [place] in 2 weeks to monitor her progress.  Obesity [name] is currently in the action stage of change. As such, her goal is to continue with weight loss efforts She has agreed to follow the Category 2 plan [name] has been instructed to work up to a goal of 150 minutes of combined cardio and strengthening exercise per week for weight loss and overall health benefits. We discussed the following Behavioral Modification Strategies today: meal planning & cooking strategies, increasing lean protein intake and decreasing simple carbohydrates   [name] has agreed to follow up with our clinic in 2 to 3 weeks. She was informed of the importance of frequent follow up visits to maximize her success with intensive lifestyle modifications for her multiple health conditions.  I, Doreene Nest, am acting as transcriptionist for Dennard Nip, MD  I have reviewed the above documentation for accuracy and completeness, and I agree with the above. -Dennard Nip, MD  OBESITY BEHAVIORAL INTERVENTION VISIT  Today's visit was # 4 out of 22.  Starting weight: 239 lbs Starting date: 12/09/16 Today's weight : 237 lbs Today's date: 01/23/2017 Total lbs lost to date: 2 (Patients must lose 7 lbs in the first 6 months to continue with counseling)   ASK: We discussed the diagnosis of obesity with [name] today and [name] agreed to give [place] permission to discuss obesity behavioral modification therapy today.  ASSESS: [name] has the diagnosis of obesity and her  BMI today is 37.11 [name] is in the action stage of change   ADVISE: [name] was educated on the multiple health risks of obesity as well  as the benefit of weight loss to improve her health. She was advised of the need for long term treatment and the importance of lifestyle modifications.  AGREE: Multiple dietary modification options and treatment options were discussed and  Racheal agreed to follow the Category 2 plan We discussed the following Behavioral Modification Strategies today: meal planning & cooking strategies, increasing lean protein intake and decreasing simple carbohydrates

## 2017-02-10 ENCOUNTER — Ambulatory Visit (INDEPENDENT_AMBULATORY_CARE_PROVIDER_SITE_OTHER): Payer: BLUE CROSS/BLUE SHIELD | Admitting: Family Medicine

## 2017-02-10 VITALS — BP 127/83 | HR 69 | Temp 98.0°F | Ht 67.0 in | Wt 236.0 lb

## 2017-02-10 DIAGNOSIS — Z6837 Body mass index (BMI) 37.0-37.9, adult: Secondary | ICD-10-CM | POA: Diagnosis not present

## 2017-02-10 DIAGNOSIS — IMO0001 Reserved for inherently not codable concepts without codable children: Secondary | ICD-10-CM

## 2017-02-10 DIAGNOSIS — K5904 Chronic idiopathic constipation: Secondary | ICD-10-CM | POA: Diagnosis not present

## 2017-02-10 DIAGNOSIS — E669 Obesity, unspecified: Secondary | ICD-10-CM | POA: Diagnosis not present

## 2017-02-10 DIAGNOSIS — R7303 Prediabetes: Secondary | ICD-10-CM | POA: Diagnosis not present

## 2017-02-10 DIAGNOSIS — E559 Vitamin D deficiency, unspecified: Secondary | ICD-10-CM | POA: Diagnosis not present

## 2017-02-10 MED ORDER — VITAMIN D (ERGOCALCIFEROL) 1.25 MG (50000 UNIT) PO CAPS: 50000.0000 [IU] | ORAL_CAPSULE | ORAL | 0 refills | Status: DC

## 2017-02-10 MED ORDER — METFORMIN HCL 500 MG PO TABS: 500.0000 mg | ORAL_TABLET | Freq: Every day | ORAL | 0 refills | Status: DC

## 2017-02-10 NOTE — Progress Notes (Signed)
Office: [phone]  /  Fax: [phone]   HPI:   Chief Complaint: OBESITY [name] is here to discuss her progress with her obesity treatment plan. She is on the Category 2 plan and is following her eating plan approximately 75 % of the time. She states she is exercising walking 20 minutes 3 times per week. [name] continues to do well with weight loss and she is walking again but she struggles to plan meals ahead of time.  Her weight is 236 lb (107 kg) today and has had a weight loss of 1 pound over a period of 2 to 3 weeks since her last visit. She has lost 3 lbs since starting treatment with [place].  Pre-Diabetes [name] has a diagnosis of pre-diabetes based on her elevated Hgb A1c and was informed this puts her at greater risk of developing diabetes. She is tolerating metformin now that she is taking it with food. [name] continues to work on diet and exercise to decrease risk of diabetes. She denies nausea, vomiting or hypoglycemia.  Vitamin D deficiency [name] has a diagnosis of vitamin D deficiency. She is currently taking prescription Vit D, not yet at goal. She still notes fatigue but is not sleeping well and denies nausea, vomiting or muscle weakness.  Chronic Constipation [name] notes constipation for the last few weeks and worse when she doesn't eat all the vegetables on her meal plan. She states BM are less frequent with mild abdominal cramping. She denies hematochezia or melena.  ALLERGIES: Allergies  Allergen Reactions  . Crestor [Rosuvastatin Calcium]     Muscle aches    MEDICATIONS: Current Outpatient Prescriptions on File Prior to Visit  Medication Sig Dispense Refill  . hydrochlorothiazide (HYDRODIURIL) 25 MG tablet TAKE 1 TABLET BY MOUTH DAILY. 90 tablet 1  . LYRICA 75 MG capsule TAKE ONE CAPSULE EVERY DAY AT BEDTIME 30 capsule 3  . pantoprazole (PROTONIX) 40 MG tablet Take 1 tablet (40 mg total) by mouth 2 (two) times daily. 60 tablet 11  . Pitavastatin Calcium (LIVALO) 4  MG TABS Take 1 tablet (4 mg total) by mouth daily. 90 tablet 3  . Probiotic Product (PROBIOTIC PO) 1 tablet daily.      Current Facility-Administered Medications on File Prior to Visit  Medication Dose Route Frequency Provider Last Rate Last Dose  . 0.9 %  sodium chloride infusion  500 mL Intravenous Continuous Ladene Artist, MD        PAST MEDICAL HISTORY: Past Medical History:  Diagnosis Date  . Arthritis   . Back pain   . Benign fundic gland polyps of stomach   . Constipation   . Diabetes mellitus without complication ([place])   . Esophagitis    LA Class B  . Fatigue   . Fatty liver   . Fatty liver   . Fibromyalgia   . Gallbladder problem   . GERD (gastroesophageal reflux disease)   . Heart murmur   . Hyperlipidemia   . Hypertension   . IBS (irritable bowel syndrome)   . Joint pain   . Neuromuscular disorder ([place])   . Prediabetes     PAST SURGICAL HISTORY: Past Surgical History:  Procedure Laterality Date  . CHOLECYSTECTOMY  2009   low GB EF, no gallstones  . COLONOSCOPY    . LAPAROSCOPY     of adhesions/ of ovaries  . UPPER GASTROINTESTINAL ENDOSCOPY    . VAGINAL HYSTERECTOMY      SOCIAL HISTORY: Social History  Substance Use Topics  .  Smoking status: Former Smoker    Packs/day: 0.10    Years: 10.00    Types: Cigarettes    Quit date: 07/14/1981  . Smokeless tobacco: Never Used  . Alcohol use No    FAMILY HISTORY: Family History  Problem Relation Age of Onset  . Colon cancer Father   . Hypertension Father   . Hyperlipidemia Father   . Obesity Father   . Heart disease Mother   . Hypertension Mother   . Hyperlipidemia Mother   . Obesity Mother   . Diabetes Brother        Half  . Heart disease Brother   . Diabetes Sister   . Breast cancer Paternal Grandmother   . Esophageal cancer Neg Hx   . Stomach cancer Neg Hx   . Rectal cancer Neg Hx     ROS: Review of Systems  Constitutional: Positive for weight loss. Negative for malaise/fatigue.    Gastrointestinal: Positive for abdominal pain and constipation. Negative for melena, nausea and vomiting.       Negative hematochezia  Musculoskeletal:       Negative muscle weakness  Endo/Heme/Allergies:       Negative hypoglycemia    PHYSICAL EXAM: Blood pressure 127/83, pulse 69, temperature 98 F (36.7 C), temperature source Oral, height 5\' 7"  (1.702 m), weight 236 lb (107 kg), SpO2 98 %. Body mass index is 36.96 kg/m. Physical Exam  Constitutional: She is oriented to person, place, and time. She appears well-developed and well-nourished.  Cardiovascular: Normal rate.   Pulmonary/Chest: Effort normal.  Musculoskeletal: Normal range of motion.  Neurological: She is oriented to person, place, and time.  Skin: Skin is warm and dry.  Psychiatric: She has a normal mood and affect. Her behavior is normal.  Vitals reviewed.   RECENT LABS AND TESTS: BMET    Component Value Date/Time   NA 139 12/09/2016 1145   K 3.9 12/09/2016 1145   CL 98 12/09/2016 1145   CO2 23 12/09/2016 1145   GLUCOSE 103 (H) 12/09/2016 1145   GLUCOSE 106 (H) 11/05/2016 1458   BUN 11 12/09/2016 1145   CREATININE 0.70 12/09/2016 1145   CREATININE 0.76 07/26/2015 1500   CALCIUM 9.9 12/09/2016 1145   GFRNONAA 93 12/09/2016 1145   GFRAA 107 12/09/2016 1145   Lab Results  Component Value Date   HGBA1C 6.4 (H) 12/09/2016   HGBA1C 5.8 09/03/2016   HGBA1C 6.2 11/20/2015   HGBA1C 6.2 10/16/2015   HGBA1C 6.9 07/26/2015   Lab Results  Component Value Date   INSULIN 18.6 12/09/2016   CBC    Component Value Date/Time   WBC 4.1 12/09/2016 1145   WBC 4.4 09/03/2016 0904   RBC 4.73 12/09/2016 1145   RBC 4.80 09/03/2016 0904   HGB 14.3 12/09/2016 1145   HCT 41.4 12/09/2016 1145   PLT 223.0 09/03/2016 0904   MCV 88 12/09/2016 1145   MCH 30.2 12/09/2016 1145   MCH 30.9 07/26/2015 1449   MCHC 34.5 12/09/2016 1145   MCHC 35.3 09/03/2016 0904   RDW 15.9 (H) 12/09/2016 1145   LYMPHSABS 2.4 12/09/2016  1145   MONOABS 0.5 09/03/2016 0904   EOSABS 0.1 12/09/2016 1145   BASOSABS 0.0 12/09/2016 1145   Iron/TIBC/Ferritin/ %Sat No results found for: IRON, TIBC, FERRITIN, IRONPCTSAT Lipid Panel     Component Value Date/Time   CHOL 261 (H) 12/09/2016 1145   TRIG 239 (H) 12/09/2016 1145   TRIG 221 11/14/2010   TRIG 221 11/14/2010   HDL  55 12/09/2016 1145   CHOLHDL 6 10/16/2015 1611   VLDL 55.0 (H) 10/16/2015 1611   LDLCALC 158 (H) 12/09/2016 1145   LDLDIRECT 179.0 10/16/2015 1611   Hepatic Function Panel     Component Value Date/Time   PROT 7.8 12/09/2016 1145   ALBUMIN 4.6 12/09/2016 1145   AST 13 12/09/2016 1145   ALT 30 12/09/2016 1145   ALKPHOS 73 12/09/2016 1145   BILITOT 0.5 12/09/2016 1145   BILIDIR 0.1 08/02/2014 0931   IBILI 0.6 01/16/2007 0123      Component Value Date/Time   TSH 0.533 12/09/2016 1145   TSH 0.41 09/03/2016 0904   TSH 0.41 10/16/2015 1611    ASSESSMENT AND PLAN: Prediabetes - Plan: metFORMIN (GLUCOPHAGE) 500 MG tablet  Vitamin D deficiency - Plan: Vitamin D, Ergocalciferol, (DRISDOL) 50000 units CAPS capsule  Chronic idiopathic constipation  Class 2 obesity with serious comorbidity and body mass index (BMI) of 37.0 to 37.9 in adult, unspecified obesity type  PLAN:  Pre-Diabetes [name] will continue to work on weight loss, exercise, and decreasing simple carbohydrates in her diet to help decrease the risk of diabetes. We dicussed metformin including benefits and risks. She was informed that eating too many simple carbohydrates or too many calories at one sitting increases the likelihood of GI side effects. [name] agrees to continue taking metformin for now and we will refill for 1 month and will recheck labs at that time. [name] agreed to follow up with our clinic in 2 to 3 weeks as directed to monitor her progress.  Vitamin D Deficiency [name] was informed that low vitamin D levels contributes to fatigue and are associated with obesity, breast, and  colon cancer. She agrees to continue taking prescription Vit D @50 ,000 IU every week and we will refill for 1 month and recheck labs at that time. She will follow up for routine testing of vitamin D, at least 2-3 times per year. She was informed of the risk of over-replacement of vitamin D and agrees to not increase her dose unless he discusses this with [place] first. [name] agrees to follow up with our clinic in 2 to 3 weeks.  Chronic Constipation [name] was informed decrease bowel movement frequency is normal while losing weight, but stools should not be hard or painful. She was advised to increase her H20 intake, increase her fiber in her diet by increasing vegetables. High fiber foods were discussed today. [place] agrees to get back to planning meals ahead of time and will follow up with our clinic in 2 to 3 weeks.  Obesity [name] is currently in the action stage of change. As such, her goal is to continue with weight loss efforts She has agreed to follow the Category 2 plan with dinner recipes [name] has been instructed to work up to a goal of 150 minutes of combined cardio and strengthening exercise per week for weight loss and overall health benefits. We discussed the following Behavioral Modification Strategies today: increasing lean protein intake, increase H20 intake, increasing fiber rich foods, work on meal planning and easy cooking plans, and keeping healthy foods in the home.   [name] has agreed to follow up with our clinic in 2 to 3 weeks. She was informed of the importance of frequent follow up visits to maximize her success with intensive lifestyle modifications for her multiple health conditions.  I, Trixie Dredge, am acting as transcriptionist for Dennard Nip, MD  I have reviewed the above documentation for accuracy and completeness, and I  agree with the above. -Dennard Nip, MD     OBESITY BEHAVIORAL INTERVENTION VISIT  Today's visit was # 5 out of 22.  Starting weight: 239  lbs Starting date: 12/09/16 Today's weight: 236 lbs Today's date: 910/18 Total lbs lost to date: 3 (Patients must lose 7 lbs in the first 6 months to continue with counseling)   ASK: We discussed the diagnosis of obesity with [name] today and [name] agreed to give [place] permission to discuss obesity behavioral modification therapy today.  ASSESS: [name] has the diagnosis of obesity and her BMI today is 79 [name] is in the action stage of change   ADVISE: [name] was educated on the multiple health risks of obesity as well as the benefit of weight loss to improve her health. She was advised of the need for long term treatment and the importance of lifestyle modifications.  AGREE: Multiple dietary modification options and treatment options were discussed and  [name] agreed to follow the Category 2 plan with dinner recipes We discussed the following Behavioral Modification Strategies today: increasing lean protein intake, increase H20 intake, increasing fiber rich foods, work on meal planning and easy cooking plans, and keeping healthy foods in the home.

## 2017-02-20 ENCOUNTER — Other Ambulatory Visit (INDEPENDENT_AMBULATORY_CARE_PROVIDER_SITE_OTHER): Payer: Self-pay | Admitting: Family Medicine

## 2017-02-20 DIAGNOSIS — E559 Vitamin D deficiency, unspecified: Secondary | ICD-10-CM

## 2017-02-21 ENCOUNTER — Other Ambulatory Visit (INDEPENDENT_AMBULATORY_CARE_PROVIDER_SITE_OTHER): Payer: Self-pay | Admitting: Family Medicine

## 2017-02-21 DIAGNOSIS — E559 Vitamin D deficiency, unspecified: Secondary | ICD-10-CM

## 2017-02-24 ENCOUNTER — Ambulatory Visit (INDEPENDENT_AMBULATORY_CARE_PROVIDER_SITE_OTHER): Payer: BLUE CROSS/BLUE SHIELD | Admitting: Family Medicine

## 2017-02-24 VITALS — BP 129/78 | HR 81 | Temp 98.2°F | Ht 67.0 in | Wt 235.0 lb

## 2017-02-24 DIAGNOSIS — K5909 Other constipation: Secondary | ICD-10-CM

## 2017-02-24 DIAGNOSIS — IMO0001 Reserved for inherently not codable concepts without codable children: Secondary | ICD-10-CM

## 2017-02-24 DIAGNOSIS — R7303 Prediabetes: Secondary | ICD-10-CM | POA: Diagnosis not present

## 2017-02-24 DIAGNOSIS — Z6836 Body mass index (BMI) 36.0-36.9, adult: Secondary | ICD-10-CM | POA: Diagnosis not present

## 2017-02-24 DIAGNOSIS — E669 Obesity, unspecified: Secondary | ICD-10-CM

## 2017-02-24 DIAGNOSIS — K589 Irritable bowel syndrome without diarrhea: Secondary | ICD-10-CM | POA: Insufficient documentation

## 2017-02-24 MED ORDER — METFORMIN HCL 500 MG PO TABS: 500.0000 mg | ORAL_TABLET | Freq: Every day | ORAL | 0 refills | Status: DC

## 2017-02-24 MED ORDER — POLYETHYLENE GLYCOL 3350 17 G PO PACK: 17.0000 g | PACK | Freq: Every day | ORAL | 0 refills | Status: DC

## 2017-02-25 NOTE — Progress Notes (Signed)
Office: [phone]  /  Fax: [phone]   HPI:   Chief Complaint: OBESITY [name] is here to discuss her progress with her obesity treatment plan. She is on the Category 2 plan with dinner recipes and is following her eating plan approximately 50 % of the time. She states she is exercising low impact aerobics and walking for 45 minutes 2 times per week. [name] continues to do well with weight loss but it is slower than expected. She is struggling with eating out too frequently and planning meals ahead of time. Her weight is 235 lb (106.6 kg) today and has had a weight loss of 1 pound over a period of 2 weeks since her last visit. She has lost 4 lbs since starting treatment with [place].  Pre-Diabetes [name] has a diagnosis of pre-diabetes based on her elevated Hgb A1c and was informed this puts her at greater risk of developing diabetes.  She is taking metformin currently and feels her metformin isn't improving her polyphagia, which seems worse in the afternoons when the metformin has worn off. [name] continues to work on diet and exercise to decrease risk of diabetes. She denies nausea or hypoglycemia.  Chronic Constipation [name] notes constipation for the last few weeks, worse since attempting weight loss. She states BM are less frequent and are not hard and painful. She denies hematochezia or melena. She has been on Linzess in the past.  ALLERGIES: Allergies  Allergen Reactions   Crestor [Rosuvastatin Calcium]     Muscle aches    MEDICATIONS: Current Outpatient Prescriptions on File Prior to Visit  Medication Sig Dispense Refill   hydrochlorothiazide (HYDRODIURIL) 25 MG tablet TAKE 1 TABLET BY MOUTH DAILY. 90 tablet 1   LYRICA 75 MG capsule TAKE ONE CAPSULE EVERY DAY AT BEDTIME 30 capsule 3   pantoprazole (PROTONIX) 40 MG tablet Take 1 tablet (40 mg total) by mouth 2 (two) times daily. 60 tablet 11   Pitavastatin Calcium (LIVALO) 4 MG TABS Take 1 tablet (4 mg total) by mouth daily.  90 tablet 3   Probiotic Product (PROBIOTIC PO) 1 tablet daily.      Vitamin D, Ergocalciferol, (DRISDOL) 50000 units CAPS capsule Take 1 capsule (50,000 Units total) by mouth every 7 (seven) days. 4 capsule 0   Current Facility-Administered Medications on File Prior to Visit  Medication Dose Route Frequency Provider Last Rate Last Dose   0.9 %  sodium chloride infusion  500 mL Intravenous Continuous Ladene Artist, MD        PAST MEDICAL HISTORY: Past Medical History:  Diagnosis Date   Arthritis    Back pain    Benign fundic gland polyps of stomach    Constipation    Diabetes mellitus without complication ([facility])    Esophagitis    LA Class B   Fatigue    Fatty liver    Fatty liver    Fibromyalgia    Gallbladder problem    GERD (gastroesophageal reflux disease)    Heart murmur    Hyperlipidemia    Hypertension    IBS (irritable bowel syndrome)    Joint pain    Neuromuscular disorder ([place])    Prediabetes     PAST SURGICAL HISTORY: Past Surgical History:  Procedure Laterality Date   CHOLECYSTECTOMY  2009   low GB EF, no gallstones   COLONOSCOPY     LAPAROSCOPY     of adhesions/ of ovaries   UPPER GASTROINTESTINAL ENDOSCOPY     VAGINAL HYSTERECTOMY  SOCIAL HISTORY: Social History  Substance Use Topics   Smoking status: Former Smoker    Packs/day: 0.10    Years: 10.00    Types: Cigarettes    Quit date: 07/14/1981   Smokeless tobacco: Never Used   Alcohol use No    FAMILY HISTORY: Family History  Problem Relation Age of Onset   Colon cancer Father    Hypertension Father    Hyperlipidemia Father    Obesity Father    Heart disease Mother    Hypertension Mother    Hyperlipidemia Mother    Obesity Mother    Diabetes Brother        23   Heart disease Brother    Diabetes Sister    Breast cancer Paternal Grandmother    Esophageal cancer Neg Hx    Stomach cancer Neg Hx    Rectal cancer Neg Hx      ROS: Review of Systems  Constitutional: Positive for weight loss.  Gastrointestinal: Negative for melena and nausea.       Negative Hematochezia  Endo/Heme/Allergies:       Positive Polyphagia Negative Hypoglycemia    PHYSICAL EXAM: Blood pressure 129/78, pulse 81, temperature 98.2 F (36.8 C), temperature source Oral, height 5\' 7"  (1.702 m), weight 235 lb (106.6 kg), SpO2 97 %. Body mass index is 36.81 kg/m. Physical Exam  Constitutional: She is oriented to person, place, and time. She appears well-developed and well-nourished.  Cardiovascular: Normal rate.   Pulmonary/Chest: Effort normal.  Musculoskeletal: Normal range of motion.  Neurological: She is oriented to person, place, and time.  Skin: Skin is warm and dry.  Psychiatric: She has a normal mood and affect. Her behavior is normal.  Vitals reviewed.   RECENT LABS AND TESTS: BMET    Component Value Date/Time   NA 139 12/09/2016 1145   K 3.9 12/09/2016 1145   CL 98 12/09/2016 1145   CO2 23 12/09/2016 1145   GLUCOSE 103 (H) 12/09/2016 1145   GLUCOSE 106 (H) 11/05/2016 1458   BUN 11 12/09/2016 1145   CREATININE 0.70 12/09/2016 1145   CREATININE 0.76 07/26/2015 1500   CALCIUM 9.9 12/09/2016 1145   GFRNONAA 93 12/09/2016 1145   GFRAA 107 12/09/2016 1145   Lab Results  Component Value Date   HGBA1C 6.4 (H) 12/09/2016   HGBA1C 5.8 09/03/2016   HGBA1C 6.2 11/20/2015   HGBA1C 6.2 10/16/2015   HGBA1C 6.9 07/26/2015   Lab Results  Component Value Date   INSULIN 18.6 12/09/2016   CBC    Component Value Date/Time   WBC 4.1 12/09/2016 1145   WBC 4.4 09/03/2016 0904   RBC 4.73 12/09/2016 1145   RBC 4.80 09/03/2016 0904   HGB 14.3 12/09/2016 1145   HCT 41.4 12/09/2016 1145   PLT 223.0 09/03/2016 0904   MCV 88 12/09/2016 1145   MCH 30.2 12/09/2016 1145   MCH 30.9 07/26/2015 1449   MCHC 34.5 12/09/2016 1145   MCHC 35.3 09/03/2016 0904   RDW 15.9 (H) 12/09/2016 1145   LYMPHSABS 2.4 12/09/2016 1145    MONOABS 0.5 09/03/2016 0904   EOSABS 0.1 12/09/2016 1145   BASOSABS 0.0 12/09/2016 1145   Iron/TIBC/Ferritin/ %Sat No results found for: IRON, TIBC, FERRITIN, IRONPCTSAT Lipid Panel     Component Value Date/Time   CHOL 261 (H) 12/09/2016 1145   TRIG 239 (H) 12/09/2016 1145   TRIG 221 11/14/2010   TRIG 221 11/14/2010   HDL 55 12/09/2016 1145   CHOLHDL 6 10/16/2015 1611  VLDL 55.0 (H) 10/16/2015 1611   LDLCALC 158 (H) 12/09/2016 1145   LDLDIRECT 179.0 10/16/2015 1611   Hepatic Function Panel     Component Value Date/Time   PROT 7.8 12/09/2016 1145   ALBUMIN 4.6 12/09/2016 1145   AST 13 12/09/2016 1145   ALT 30 12/09/2016 1145   ALKPHOS 73 12/09/2016 1145   BILITOT 0.5 12/09/2016 1145   BILIDIR 0.1 08/02/2014 0931   IBILI 0.6 01/16/2007 0123      Component Value Date/Time   TSH 0.533 12/09/2016 1145   TSH 0.41 09/03/2016 0904   TSH 0.41 10/16/2015 1611    ASSESSMENT AND PLAN: Prediabetes - Plan: metFORMIN (GLUCOPHAGE) 500 MG tablet  Chronic constipation - Plan: polyethylene glycol (MIRALAX) packet  Class 2 obesity with serious comorbidity and body mass index (BMI) of 36.0 to 36.9 in adult, unspecified obesity type  PLAN:  Pre-Diabetes [name] will continue to work on weight loss, exercise, and decreasing simple carbohydrates in her diet to help decrease the risk of diabetes. We dicussed metformin including benefits and risks. She was informed that eating too many simple carbohydrates or too many calories at one sitting increases the likelihood of GI side effects. [name] agrees to change metformin to lunchtime for now and a prescription was written today for 1 month refill. [name] agreed to follow up with [place] as directed to monitor her progress.  Chronic Constipation [name] was informed decrease bowel movement frequency is normal while losing weight, but stools should not be hard or painful. She was advised to increase her H20 intake and work on increasing her fiber intake.  High fiber foods were discussed today. [name] agrees to start OTC Miralax 1/2 dose daily and follow up with our clinic in 2 weeks.  Obesity [name] is currently in the action stage of change. As such, her goal is to continue with weight loss efforts She has agreed to follow the Category 2 plan [name] has been instructed to work up to a goal of 150 minutes of combined cardio and strengthening exercise per week or continue low impact aerobics and walking for 45 minutes 2 times per week for weight loss and overall health benefits. We discussed the following Behavioral Modification Strategies today: planning for success, keeping healthy foods in the home, decrease eating out and work on meal planning and easy cooking plans  [name] has agreed to follow up with our clinic in 2 weeks. She was informed of the importance of frequent follow up visits to maximize her success with intensive lifestyle modifications for her multiple health conditions.  I, Doreene Nest, am acting as transcriptionist for Dennard Nip, MD  I have reviewed the above documentation for accuracy and completeness, and I agree with the above. -Dennard Nip, MD   OBESITY BEHAVIORAL INTERVENTION VISIT  Today's visit was # 6 out of 22.  Starting weight: 239 lbs Starting date: 12/09/16 Today's weight : 235 lbs  Today's date: 02/24/2017 Total lbs lost to date: 4 (Patients must lose 7 lbs in the first 6 months to continue with counseling)   ASK: We discussed the diagnosis of obesity with [name] today and [name] agreed to give [place] permission to discuss obesity behavioral modification therapy today.  ASSESS: [name] has the diagnosis of obesity and her BMI today is 36.8 [name] is in the action stage of change   ADVISE: [name] was educated on the multiple health risks of obesity as well as the benefit of weight loss to improve her health. She was  advised of the need for long term treatment and the importance of lifestyle  modifications.  AGREE: Multiple dietary modification options and treatment options were discussed and  [name] agreed to follow the Category 2 plan We discussed the following Behavioral Modification Strategies today: planning for success, keeping healthy foods in the home, decrease eating out and work on meal planning and easy cooking plans

## 2017-03-01 ENCOUNTER — Other Ambulatory Visit: Payer: Self-pay | Admitting: Internal Medicine

## 2017-03-01 DIAGNOSIS — K581 Irritable bowel syndrome with constipation: Secondary | ICD-10-CM

## 2017-03-12 ENCOUNTER — Ambulatory Visit (INDEPENDENT_AMBULATORY_CARE_PROVIDER_SITE_OTHER): Payer: BLUE CROSS/BLUE SHIELD | Admitting: Physician Assistant

## 2017-03-12 ENCOUNTER — Other Ambulatory Visit (INDEPENDENT_AMBULATORY_CARE_PROVIDER_SITE_OTHER): Payer: Self-pay | Admitting: Family Medicine

## 2017-03-12 VITALS — BP 119/77 | HR 76 | Temp 98.2°F | Ht 67.0 in | Wt 236.0 lb

## 2017-03-12 DIAGNOSIS — R7303 Prediabetes: Secondary | ICD-10-CM

## 2017-03-12 DIAGNOSIS — Z6837 Body mass index (BMI) 37.0-37.9, adult: Secondary | ICD-10-CM

## 2017-03-12 DIAGNOSIS — E559 Vitamin D deficiency, unspecified: Secondary | ICD-10-CM

## 2017-03-12 MED ORDER — VITAMIN D (ERGOCALCIFEROL) 1.25 MG (50000 UNIT) PO CAPS: 50000.0000 [IU] | ORAL_CAPSULE | ORAL | 0 refills | Status: DC

## 2017-03-13 ENCOUNTER — Other Ambulatory Visit (INDEPENDENT_AMBULATORY_CARE_PROVIDER_SITE_OTHER): Payer: Self-pay | Admitting: Family Medicine

## 2017-03-13 DIAGNOSIS — R7303 Prediabetes: Secondary | ICD-10-CM

## 2017-03-13 NOTE — Progress Notes (Signed)
Office: [phone]  /  Fax: [phone]   HPI:   Chief Complaint: OBESITY [name] is here to discuss her progress with her obesity treatment plan. She is on the Category 2 plan and is following her eating plan approximately 30 % of the time. She states she is walking for 30 minutes 2 times per week. [name] states her hunger is not controlled, however she eats less than her assigned calories and does not eat the protein on the meal plan. [name] would like more meal planning ideas. Her weight is 236 lb (107 kg) today and has had a weight gain of 1 pound over a period of 2 weeks since her last visit. She has lost 3 lbs since starting treatment with [place].  Vitamin D deficiency [name] has a diagnosis of vitamin D deficiency. She is currently taking vit D and denies nausea, vomiting or muscle weakness.  Pre-Diabetes [name] has a diagnosis of pre-diabetes based on her elevated Hgb A1c and was informed this puts her at greater risk of developing diabetes. She is taking metformin currently and continues to work on diet and exercise to decrease risk of diabetes. She admits polyphagia and denies nausea or hypoglycemia.   ALLERGIES: Allergies  Allergen Reactions  . Crestor [Rosuvastatin Calcium]     Muscle aches    MEDICATIONS: Current Outpatient Prescriptions on File Prior to Visit  Medication Sig Dispense Refill  . hydrochlorothiazide (HYDRODIURIL) 25 MG tablet TAKE 1 TABLET BY MOUTH DAILY. 90 tablet 1  . LINZESS 290 MCG CAPS capsule TAKE 1 CAPSULE (290 MCG TOTAL) BY MOUTH DAILY BEFORE BREAKFAST. 90 capsule 1  . LYRICA 75 MG capsule TAKE ONE CAPSULE EVERY DAY AT BEDTIME 30 capsule 3  . metFORMIN (GLUCOPHAGE) 500 MG tablet Take 1 tablet (500 mg total) by mouth daily at 12 noon. 30 tablet 0  . pantoprazole (PROTONIX) 40 MG tablet Take 1 tablet (40 mg total) by mouth 2 (two) times daily. 60 tablet 11  . Pitavastatin Calcium (LIVALO) 4 MG TABS Take 1 tablet (4 mg total) by mouth daily. 90 tablet 3    . polyethylene glycol (MIRALAX) packet Take 17 g by mouth daily. Take 1/2 daily 1 each 0  . Probiotic Product (PROBIOTIC PO) 1 tablet daily.      Current Facility-Administered Medications on File Prior to Visit  Medication Dose Route Frequency Provider Last Rate Last Dose  . 0.9 %  sodium chloride infusion  500 mL Intravenous Continuous Ladene Artist, MD        PAST MEDICAL HISTORY: Past Medical History:  Diagnosis Date  . Arthritis   . Back pain   . Benign fundic gland polyps of stomach   . Constipation   . Diabetes mellitus without complication ([place])   . Esophagitis    LA Class B  . Fatigue   . Fatty liver   . Fatty liver   . Fibromyalgia   . Gallbladder problem   . GERD (gastroesophageal reflux disease)   . Heart murmur   . Hyperlipidemia   . Hypertension   . IBS (irritable bowel syndrome)   . Joint pain   . Neuromuscular disorder ([place])   . Prediabetes     PAST SURGICAL HISTORY: Past Surgical History:  Procedure Laterality Date  . CHOLECYSTECTOMY  2009   low GB EF, no gallstones  . COLONOSCOPY    . LAPAROSCOPY     of adhesions/ of ovaries  . UPPER GASTROINTESTINAL ENDOSCOPY    . VAGINAL HYSTERECTOMY  SOCIAL HISTORY: Social History  Substance Use Topics  . Smoking status: Former Smoker    Packs/day: 0.10    Years: 10.00    Types: Cigarettes    Quit date: 07/14/1981  . Smokeless tobacco: Never Used  . Alcohol use No    FAMILY HISTORY: Family History  Problem Relation Age of Onset  . Colon cancer Father   . Hypertension Father   . Hyperlipidemia Father   . Obesity Father   . Heart disease Mother   . Hypertension Mother   . Hyperlipidemia Mother   . Obesity Mother   . Diabetes Brother        Half  . Heart disease Brother   . Diabetes Sister   . Breast cancer Paternal Grandmother   . Esophageal cancer Neg Hx   . Stomach cancer Neg Hx   . Rectal cancer Neg Hx     ROS: Review of Systems  Constitutional: Negative for weight loss.   Gastrointestinal: Negative for nausea and vomiting.  Musculoskeletal:       Negative muscle weakness  Endo/Heme/Allergies:       Positive polyphagia Negative hypoglycemia    PHYSICAL EXAM: Blood pressure 119/77, pulse 76, temperature 98.2 F (36.8 C), temperature source Oral, height 5\' 7"  (1.702 m), weight 236 lb (107 kg), SpO2 99 %. Body mass index is 36.96 kg/m. Physical Exam  Constitutional: She is oriented to person, place, and time. She appears well-developed and well-nourished.  Cardiovascular: Normal rate.   Pulmonary/Chest: Effort normal.  Musculoskeletal: Normal range of motion.  Neurological: She is oriented to person, place, and time.  Skin: Skin is warm and dry.  Psychiatric: She has a normal mood and affect. Her behavior is normal.  Vitals reviewed.   RECENT LABS AND TESTS: BMET    Component Value Date/Time   NA 139 12/09/2016 1145   K 3.9 12/09/2016 1145   CL 98 12/09/2016 1145   CO2 23 12/09/2016 1145   GLUCOSE 103 (H) 12/09/2016 1145   GLUCOSE 106 (H) 11/05/2016 1458   BUN 11 12/09/2016 1145   CREATININE 0.70 12/09/2016 1145   CREATININE 0.76 07/26/2015 1500   CALCIUM 9.9 12/09/2016 1145   GFRNONAA 93 12/09/2016 1145   GFRAA 107 12/09/2016 1145   Lab Results  Component Value Date   HGBA1C 6.4 (H) 12/09/2016   HGBA1C 5.8 09/03/2016   HGBA1C 6.2 11/20/2015   HGBA1C 6.2 10/16/2015   HGBA1C 6.9 07/26/2015   Lab Results  Component Value Date   INSULIN 18.6 12/09/2016   CBC    Component Value Date/Time   WBC 4.1 12/09/2016 1145   WBC 4.4 09/03/2016 0904   RBC 4.73 12/09/2016 1145   RBC 4.80 09/03/2016 0904   HGB 14.3 12/09/2016 1145   HCT 41.4 12/09/2016 1145   PLT 223.0 09/03/2016 0904   MCV 88 12/09/2016 1145   MCH 30.2 12/09/2016 1145   MCH 30.9 07/26/2015 1449   MCHC 34.5 12/09/2016 1145   MCHC 35.3 09/03/2016 0904   RDW 15.9 (H) 12/09/2016 1145   LYMPHSABS 2.4 12/09/2016 1145   MONOABS 0.5 09/03/2016 0904   EOSABS 0.1 12/09/2016  1145   BASOSABS 0.0 12/09/2016 1145   Iron/TIBC/Ferritin/ %Sat No results found for: IRON, TIBC, FERRITIN, IRONPCTSAT Lipid Panel     Component Value Date/Time   CHOL 261 (H) 12/09/2016 1145   TRIG 239 (H) 12/09/2016 1145   TRIG 221 11/14/2010   TRIG 221 11/14/2010   HDL 55 12/09/2016 1145   CHOLHDL 6  10/16/2015 1611   VLDL 55.0 (H) 10/16/2015 1611   LDLCALC 158 (H) 12/09/2016 1145   LDLDIRECT 179.0 10/16/2015 1611   Hepatic Function Panel     Component Value Date/Time   PROT 7.8 12/09/2016 1145   ALBUMIN 4.6 12/09/2016 1145   AST 13 12/09/2016 1145   ALT 30 12/09/2016 1145   ALKPHOS 73 12/09/2016 1145   BILITOT 0.5 12/09/2016 1145   BILIDIR 0.1 08/02/2014 0931   IBILI 0.6 01/16/2007 0123      Component Value Date/Time   TSH 0.533 12/09/2016 1145   TSH 0.41 09/03/2016 0904   TSH 0.41 10/16/2015 1611    ASSESSMENT AND PLAN: Vitamin D deficiency - Plan: Vitamin D, Ergocalciferol, (DRISDOL) 50000 units CAPS capsule  Pre-diabetes  Class 2 severe obesity with serious comorbidity and body mass index (BMI) of 37.0 to 37.9 in adult, unspecified obesity type ([place])  PLAN:  Vitamin D Deficiency [name] was informed that low vitamin D levels contributes to fatigue and are associated with obesity, breast, and colon cancer. She agrees to continue to take prescription Vit D @50 ,000 IU every week #4 with no refills and will follow up for routine testing of vitamin D, at least 2-3 times per year. She was informed of the risk of over-replacement of vitamin D and agrees to not increase her dose unless he discusses this with [place] first.  Pre-Diabetes [name] will continue to work on weight loss, exercise, and decreasing simple carbohydrates in her diet to help decrease the risk of diabetes. We dicussed metformin including benefits and risks. She was informed that eating too many simple carbohydrates or too many calories at one sitting increases the likelihood of GI side effects. Kimm agrees  to continue metformin for now and a prescription was not written today. [name] agreed to follow up with [place] as directed to monitor her progress.  Obesity [name] is currently in the action stage of change. As such, her goal is to continue with weight loss efforts She has agreed to follow the Category 2 plan [name] has been instructed to work up to a goal of 150 minutes of combined cardio and strengthening exercise per week for weight loss and overall health benefits. We discussed the following Behavioral Modification Strategies today: increasing lean protein intake and planning for success  [name] has agreed to follow up with our clinic in 3 weeks. She was informed of the importance of frequent follow up visits to maximize her success with intensive lifestyle modifications for her multiple health conditions.  I, Doreene Nest, am acting as transcriptionist for Lacy Duverney, PA-C  I have reviewed the above documentation for accuracy and completeness, and I agree with the above. -Lacy Duverney, PA-C  I have reviewed the above note and agree with the plan. -Dennard Nip, MD   OBESITY BEHAVIORAL INTERVENTION VISIT  Today's visit was # 7 out of 22.  Starting weight: 239 lbs Starting date: 12/09/16 Today's weight : 236 lbs Today's date: 03/12/2017 Total lbs lost to date: 3 (Patients must lose 7 lbs in the first 6 months to continue with counseling)   ASK: We discussed the diagnosis of obesity with [name] today and [name] agreed to give [place] permission to discuss obesity behavioral modification therapy today.  ASSESS: [name] has the diagnosis of obesity and her BMI today is 36.95 [name] is in the action stage of change   ADVISE: [name] was educated on the multiple health risks of obesity as well as the benefit of weight loss  to improve her health. She was advised of the need for long term treatment and the importance of lifestyle modifications.  AGREE: Multiple dietary modification options  and treatment options were discussed and  [name] agreed to follow the Category 2 plan We discussed the following Behavioral Modification Strategies today: increasing lean protein intake and planning for success.

## 2017-03-14 ENCOUNTER — Other Ambulatory Visit (INDEPENDENT_AMBULATORY_CARE_PROVIDER_SITE_OTHER): Payer: Self-pay | Admitting: Family Medicine

## 2017-03-14 DIAGNOSIS — R7303 Prediabetes: Secondary | ICD-10-CM

## 2017-03-31 ENCOUNTER — Ambulatory Visit (INDEPENDENT_AMBULATORY_CARE_PROVIDER_SITE_OTHER): Payer: BLUE CROSS/BLUE SHIELD | Admitting: Physician Assistant

## 2017-04-02 DIAGNOSIS — Z23 Encounter for immunization: Secondary | ICD-10-CM | POA: Diagnosis not present

## 2017-04-03 ENCOUNTER — Other Ambulatory Visit (INDEPENDENT_AMBULATORY_CARE_PROVIDER_SITE_OTHER): Payer: Self-pay | Admitting: Family Medicine

## 2017-04-03 DIAGNOSIS — R7303 Prediabetes: Secondary | ICD-10-CM

## 2017-04-14 ENCOUNTER — Ambulatory Visit (INDEPENDENT_AMBULATORY_CARE_PROVIDER_SITE_OTHER): Payer: BLUE CROSS/BLUE SHIELD | Admitting: Physician Assistant

## 2017-04-14 VITALS — BP 122/78 | HR 70 | Temp 97.8°F | Ht 67.0 in | Wt 235.0 lb

## 2017-04-14 DIAGNOSIS — E7849 Other hyperlipidemia: Secondary | ICD-10-CM | POA: Diagnosis not present

## 2017-04-14 DIAGNOSIS — Z6836 Body mass index (BMI) 36.0-36.9, adult: Secondary | ICD-10-CM

## 2017-04-14 DIAGNOSIS — I10 Essential (primary) hypertension: Secondary | ICD-10-CM

## 2017-04-14 DIAGNOSIS — F3289 Other specified depressive episodes: Secondary | ICD-10-CM

## 2017-04-14 DIAGNOSIS — R748 Abnormal levels of other serum enzymes: Secondary | ICD-10-CM | POA: Diagnosis not present

## 2017-04-14 DIAGNOSIS — R7303 Prediabetes: Secondary | ICD-10-CM

## 2017-04-14 DIAGNOSIS — E559 Vitamin D deficiency, unspecified: Secondary | ICD-10-CM

## 2017-04-14 MED ORDER — VITAMIN D (ERGOCALCIFEROL) 1.25 MG (50000 UNIT) PO CAPS: 50000.0000 [IU] | ORAL_CAPSULE | ORAL | 0 refills | Status: DC

## 2017-04-14 MED ORDER — BUPROPION HCL ER (SR) 150 MG PO TB12: 150.0000 mg | ORAL_TABLET | Freq: Every day | ORAL | 0 refills | Status: DC

## 2017-04-14 MED ORDER — METFORMIN HCL 500 MG PO TABS: 500.0000 mg | ORAL_TABLET | Freq: Two times a day (BID) | ORAL | 0 refills | Status: DC

## 2017-04-14 NOTE — Progress Notes (Signed)
Office: [phone]  /  Fax: [phone]   HPI:   Chief Complaint: OBESITY [name] is here to discuss her progress with her obesity treatment plan. She is on the Category 2 plan and is following her eating plan approximately 70 % of the time. She states she is walking 60 minutes 5 times per week. [name] continues to do well with weight loss. She states she is busier taking care of her sick husband and has been eating out more. She noticed increased emotional eating as she has not been eating her required daily protein intake. She asks for medicine to help with craving. She requests a 4 week follow up appointment as she is busier with doctor visits for her terminally sick husband.  Her weight is 235 lb (106.6 kg) today and has had a weight loss of 1 pound over a period of 4 to 5 weeks since her last visit. She has lost 4 lbs since starting treatment with [place].  Hypertension [name] is a 61 y.o. female with hypertension. [name] denies chest pain or shortness of breath. She is working weight loss to help control her blood pressure with the goal of decreasing her risk of heart attack and stroke. Joyces blood pressure is stable.  Hyperlipidemia [name] has hyperlipidemia and has been trying to improve her cholesterol levels with intensive lifestyle modification including a low saturated fat diet, exercise and weight loss. She is not on statin and she declines medication today. She denies any chest pain, claudication or myalgias.  Vitamin D deficiency [name] has a diagnosis of vitamin D deficiency. She is currently taking prescription Vit D and denies nausea, vomiting or muscle weakness.  Pre-Diabetes [name] has a diagnosis of pre-diabetes based on her elevated Hgb A1c and was informed this puts her at greater risk of developing diabetes. She is taking metformin currently and continues to work on diet and exercise to decrease risk of diabetes. She denies nausea, polyphagia or  hypoglycemia.  Elevated Vitamin B12 [name] has a diagnosis of elevated B12. [name] was on on Vitamin B12 but she has stopped taking it.  Depression with emotional eating behaviors [name] is struggling with emotional eating and using food for comfort to the extent that it is negatively impacting her health. She often snacks when she is not hungry. [name] sometimes feels she is out of control and then feels guilty that she made poor food choices. She has been working on behavior modification techniques to help reduce her emotional eating and has been somewhat successful. Her mood is stable and she shows no sign of suicidal or homicidal ideations.  Depression screen [facility] 2/9 12/09/2016 10/16/2015 08/06/2015 07/26/2015 04/20/2015  Decreased Interest 1 0 0 0 0  Down, Depressed, Hopeless 0 0 0 0 0  PHQ - 2 Score 1 0 0 0 0  Altered sleeping 3 - - - -  Tired, decreased energy 3 - - - -  Change in appetite 1 - - - -  Feeling bad or failure about yourself  0 - - - -  Trouble concentrating 1 - - - -  Moving slowly or fidgety/restless 1 - - - -  Suicidal thoughts 0 - - - -  PHQ-9 Score 10 - - - -   ALLERGIES: Allergies  Allergen Reactions  . Crestor [Rosuvastatin Calcium]     Muscle aches    MEDICATIONS: Current Outpatient Medications on File Prior to Visit  Medication Sig Dispense Refill  . hydrochlorothiazide (HYDRODIURIL) 25 MG  tablet TAKE 1 TABLET BY MOUTH DAILY. 90 tablet 1  . LINZESS 290 MCG CAPS capsule TAKE 1 CAPSULE (290 MCG TOTAL) BY MOUTH DAILY BEFORE BREAKFAST. 90 capsule 1  . LYRICA 75 MG capsule TAKE ONE CAPSULE EVERY DAY AT BEDTIME 30 capsule 3  . pantoprazole (PROTONIX) 40 MG tablet Take 1 tablet (40 mg total) by mouth 2 (two) times daily. 60 tablet 11  . Pitavastatin Calcium (LIVALO) 4 MG TABS Take 1 tablet (4 mg total) by mouth daily. 90 tablet 3  . polyethylene glycol (MIRALAX) packet Take 17 g by mouth daily. Take 1/2 daily 1 each 0  . Probiotic Product (PROBIOTIC PO) 1 tablet  daily.     . Multiple Vitamin (MULTI-VITAMINS) TABS Take by mouth.     Current Facility-Administered Medications on File Prior to Visit  Medication Dose Route Frequency Provider Last Rate Last Dose  . 0.9 %  sodium chloride infusion  500 mL Intravenous Continuous Ladene Artist, MD        PAST MEDICAL HISTORY: Past Medical History:  Diagnosis Date  . Arthritis   . Back pain   . Benign fundic gland polyps of stomach   . Constipation   . Diabetes mellitus without complication ([place])   . Esophagitis    LA Class B  . Fatigue   . Fatty liver   . Fatty liver   . Fibromyalgia   . Gallbladder problem   . GERD (gastroesophageal reflux disease)   . Heart murmur   . Hyperlipidemia   . Hypertension   . IBS (irritable bowel syndrome)   . Joint pain   . Neuromuscular disorder ([place])   . Prediabetes     PAST SURGICAL HISTORY: Past Surgical History:  Procedure Laterality Date  . CHOLECYSTECTOMY  2009   low GB EF, no gallstones  . COLONOSCOPY    . LAPAROSCOPY     of adhesions/ of ovaries  . UPPER GASTROINTESTINAL ENDOSCOPY    . VAGINAL HYSTERECTOMY      SOCIAL HISTORY: Social History   Tobacco Use  . Smoking status: Former Smoker    Packs/day: 0.10    Years: 10.00    Pack years: 1.00    Types: Cigarettes    Last attempt to quit: 07/14/1981    Years since quitting: 35.7  . Smokeless tobacco: Never Used  Substance Use Topics  . Alcohol use: No  . Drug use: No    FAMILY HISTORY: Family History  Problem Relation Age of Onset  . Colon cancer Father   . Hypertension Father   . Hyperlipidemia Father   . Obesity Father   . Heart disease Mother   . Hypertension Mother   . Hyperlipidemia Mother   . Obesity Mother   . Diabetes Brother        Half  . Heart disease Brother   . Diabetes Sister   . Breast cancer Paternal Grandmother   . Esophageal cancer Neg Hx   . Stomach cancer Neg Hx   . Rectal cancer Neg Hx     ROS: Review of Systems  Constitutional: Positive  for weight loss.  Respiratory: Negative for shortness of breath.   Cardiovascular: Negative for chest pain and claudication.  Gastrointestinal: Negative for nausea and vomiting.  Musculoskeletal: Negative for myalgias.       Negative muscle weakness  Endo/Heme/Allergies:       Negative polyphagia Negative hypoglycemia  Psychiatric/Behavioral: Positive for depression. Negative for suicidal ideas.    PHYSICAL EXAM: Blood pressure  122/78, pulse 70, temperature 97.8 F (36.6 C), temperature source Oral, height 5\' 7"  (1.702 m), weight 235 lb (106.6 kg), SpO2 98 %. Body mass index is 36.81 kg/m. Physical Exam  Constitutional: She is oriented to person, place, and time. She appears well-developed and well-nourished.  Cardiovascular: Normal rate.  Pulmonary/Chest: Effort normal.  Musculoskeletal: Normal range of motion.  Neurological: She is oriented to person, place, and time.  Skin: Skin is warm and dry.  Psychiatric: She has a normal mood and affect.  Vitals reviewed.   RECENT LABS AND TESTS: BMET    Component Value Date/Time   NA 139 12/09/2016 1145   K 3.9 12/09/2016 1145   CL 98 12/09/2016 1145   CO2 23 12/09/2016 1145   GLUCOSE 103 (H) 12/09/2016 1145   GLUCOSE 106 (H) 11/05/2016 1458   BUN 11 12/09/2016 1145   CREATININE 0.70 12/09/2016 1145   CREATININE 0.76 07/26/2015 1500   CALCIUM 9.9 12/09/2016 1145   GFRNONAA 93 12/09/2016 1145   GFRAA 107 12/09/2016 1145   Lab Results  Component Value Date   HGBA1C 6.4 (H) 12/09/2016   HGBA1C 5.8 09/03/2016   HGBA1C 6.2 11/20/2015   HGBA1C 6.2 10/16/2015   HGBA1C 6.9 07/26/2015   Lab Results  Component Value Date   INSULIN 18.6 12/09/2016   CBC    Component Value Date/Time   WBC 4.1 12/09/2016 1145   WBC 4.4 09/03/2016 0904   RBC 4.73 12/09/2016 1145   RBC 4.80 09/03/2016 0904   HGB 14.3 12/09/2016 1145   HCT 41.4 12/09/2016 1145   PLT 223.0 09/03/2016 0904   MCV 88 12/09/2016 1145   MCH 30.2 12/09/2016 1145     MCH 30.9 07/26/2015 1449   MCHC 34.5 12/09/2016 1145   MCHC 35.3 09/03/2016 0904   RDW 15.9 (H) 12/09/2016 1145   LYMPHSABS 2.4 12/09/2016 1145   MONOABS 0.5 09/03/2016 0904   EOSABS 0.1 12/09/2016 1145   BASOSABS 0.0 12/09/2016 1145   Iron/TIBC/Ferritin/ %Sat No results found for: IRON, TIBC, FERRITIN, IRONPCTSAT Lipid Panel     Component Value Date/Time   CHOL 261 (H) 12/09/2016 1145   TRIG 239 (H) 12/09/2016 1145   TRIG 221 11/14/2010   TRIG 221 11/14/2010   HDL 55 12/09/2016 1145   CHOLHDL 6 10/16/2015 1611   VLDL 55.0 (H) 10/16/2015 1611   LDLCALC 158 (H) 12/09/2016 1145   LDLDIRECT 179.0 10/16/2015 1611   Hepatic Function Panel     Component Value Date/Time   PROT 7.8 12/09/2016 1145   ALBUMIN 4.6 12/09/2016 1145   AST 13 12/09/2016 1145   ALT 30 12/09/2016 1145   ALKPHOS 73 12/09/2016 1145   BILITOT 0.5 12/09/2016 1145   BILIDIR 0.1 08/02/2014 0931   IBILI 0.6 01/16/2007 0123      Component Value Date/Time   TSH 0.533 12/09/2016 1145   TSH 0.41 09/03/2016 0904   TSH 0.41 10/16/2015 1611    ASSESSMENT AND PLAN: Essential hypertension - Plan: Comprehensive metabolic panel  Other hyperlipidemia - Plan: Lipid panel  Vitamin D deficiency - Plan: VITAMIN D 25 Hydroxy (Vit-D Deficiency, Fractures), Vitamin D, Ergocalciferol, (DRISDOL) 50000 units CAPS capsule  Prediabetes - Plan: Insulin, random, Hemoglobin A1c, metFORMIN (GLUCOPHAGE) 500 MG tablet  Other depression - with emotional eating  Elevated vitamin B12 level - Plan: Vitamin B12  Class 2 severe obesity with serious comorbidity and body mass index (BMI) of 36.0 to 36.9 in adult, unspecified obesity type ([facility])  PLAN:  Hypertension We discussed  sodium restriction, working on healthy weight loss, and a regular exercise program as the means to achieve improved blood pressure control. Lia agreed with this plan and agreed to follow up as directed. We will continue to monitor her blood pressure as  well as her progress with the above lifestyle modifications. She will continue her medications as prescribed and will watch for signs of hypotension as she continues her lifestyle modifications. We will check labs and Makynleigh agrees to follow up with our clinic in 4 weeks.  Hyperlipidemia Brissia was informed of the American Heart Association Guidelines emphasizing intensive lifestyle modifications as the first line treatment for hyperlipidemia. We discussed many lifestyle modifications today in depth, and Lastacia will continue to work on decreasing saturated fats such as fatty red meat, butter and many fried foods. She will also increase vegetables and lean protein in her diet and continue to work on diet, exercise and weight loss efforts. We will check labs and Chealsey agrees to follow up with our clinic in 4 weeks.  Vitamin D Deficiency Consuelo was informed that low vitamin D levels contributes to fatigue and are associated with obesity, breast, and colon cancer. Meleane agrees to continue taking prescription Vit D @50 ,000 IU every week #4 and we will refill for 1 month and will follow up for routine testing of vitamin D, at least 2-3 times per year. She was informed of the risk of over-replacement of vitamin D and agrees to not increase her dose unless he discusses this with [place] first. Ahnika agrees to follow up with clinic in 4 weeks.  Pre-Diabetes Juley will continue to work on weight loss, exercise, and decreasing simple carbohydrates in her diet to help decrease the risk of diabetes. We dicussed metformin including benefits and risks. She was informed that eating too many simple carbohydrates or too many calories at one sitting increases the likelihood of GI side effects. Kashlynn agrees to continue taking metformin 500 mg BID #60 and we will refill for 1 month. Wallis agrees to follow up with our clinic in 4 weeks as directed to monitor her progress.  Elevated Vitamin B12 Bettye will work on decreasing B12 rich  foods in her diet. B12 supplementation was not prescribed today. We will check labs and Tekia agrees to follow up with our clinic in 4 weeks.  Depression with Emotional Eating Behaviors We discussed behavior modification techniques today to help Desiree deal with her emotional eating and depression. Rokhaya agrees to start Wellbutrin SR 150 mg qd #30 with no refills and she agrees to follow up with our clinic in 4 weeks.  Obesity Fritzi is currently in the action stage of change. As such, her goal is to continue with weight loss efforts She has agreed to change to keep a food journal with 1200 calories and 90 grams of protein daily Kaela has been instructed to work up to a goal of 150 minutes of combined cardio and strengthening exercise per week for weight loss and overall health benefits. We discussed the following Behavioral Modification Stratagies today: increasing lean protein intake and emotional eating strategies and keep a strict food journal   Leiyah has agreed to follow up with our clinic in 4 weeks. She was informed of the importance of frequent follow up visits to maximize her success with intensive lifestyle modifications for her multiple health conditions.  Wilhemena Durie, am acting as transcriptionist for Lacy Duverney, PA-C  I have reviewed the above documentation for accuracy and completeness, and I agree  with the above. -Lacy Duverney, PA-C  I have reviewed the above note and agree with the plan. -Dennard Nip, MD      Today's visit was # 8 out of 22.  Starting weight: 239 lbs Starting date: 12/09/16 Today's weight : 235 lbs Today's date: 04/14/2017 Total lbs lost to date: 4 (Patients must lose 7 lbs in the first 6 months to continue with counseling)   ASK: We discussed the diagnosis of obesity with Cory Roughen today and Lakea agreed to give [place] permission to discuss obesity behavioral modification therapy today.  ASSESS: Icela has the diagnosis of obesity and her  BMI today is 36.8 Kelliann is in the action stage of change   ADVISE: Itxel was educated on the multiple health risks of obesity as well as the benefit of weight loss to improve her health. She was advised of the need for long term treatment and the importance of lifestyle modifications.  AGREE: Multiple dietary modification options and treatment options were discussed and  Shanai agreed to keep a food journal with 1200 calories and 90 grams of protein daily We discussed the following Behavioral Modification Strategies today: increasing lean protein intake and emotional eating strategies and keep a strict food journal

## 2017-04-29 ENCOUNTER — Other Ambulatory Visit: Payer: Self-pay | Admitting: Internal Medicine

## 2017-05-12 ENCOUNTER — Ambulatory Visit (INDEPENDENT_AMBULATORY_CARE_PROVIDER_SITE_OTHER): Payer: BLUE CROSS/BLUE SHIELD | Admitting: Physician Assistant

## 2017-05-23 ENCOUNTER — Other Ambulatory Visit: Payer: Self-pay | Admitting: Internal Medicine

## 2017-05-23 DIAGNOSIS — M797 Fibromyalgia: Secondary | ICD-10-CM

## 2017-05-23 DIAGNOSIS — G479 Sleep disorder, unspecified: Secondary | ICD-10-CM

## 2017-07-21 ENCOUNTER — Other Ambulatory Visit: Payer: Self-pay | Admitting: Internal Medicine

## 2017-07-21 DIAGNOSIS — G479 Sleep disorder, unspecified: Secondary | ICD-10-CM

## 2017-07-21 DIAGNOSIS — M797 Fibromyalgia: Secondary | ICD-10-CM

## 2017-07-26 ENCOUNTER — Other Ambulatory Visit: Payer: Self-pay | Admitting: Internal Medicine

## 2017-08-12 DIAGNOSIS — E119 Type 2 diabetes mellitus without complications: Secondary | ICD-10-CM | POA: Diagnosis not present

## 2017-08-12 DIAGNOSIS — H5213 Myopia, bilateral: Secondary | ICD-10-CM | POA: Diagnosis not present

## 2017-08-12 DIAGNOSIS — H2513 Age-related nuclear cataract, bilateral: Secondary | ICD-10-CM | POA: Diagnosis not present

## 2017-08-12 DIAGNOSIS — H524 Presbyopia: Secondary | ICD-10-CM | POA: Diagnosis not present

## 2017-08-12 DIAGNOSIS — H52203 Unspecified astigmatism, bilateral: Secondary | ICD-10-CM | POA: Diagnosis not present

## 2017-08-12 LAB — HM DIABETES EYE EXAM

## 2017-08-21 ENCOUNTER — Encounter: Payer: Self-pay | Admitting: Internal Medicine

## 2017-09-10 ENCOUNTER — Ambulatory Visit: Payer: BLUE CROSS/BLUE SHIELD | Admitting: Internal Medicine

## 2017-09-10 ENCOUNTER — Encounter: Payer: Self-pay | Admitting: Internal Medicine

## 2017-09-10 VITALS — BP 124/82 | HR 70 | Temp 98.0°F | Ht 67.0 in | Wt 238.0 lb

## 2017-09-10 DIAGNOSIS — B0229 Other postherpetic nervous system involvement: Secondary | ICD-10-CM | POA: Diagnosis not present

## 2017-09-10 MED ORDER — LIDOCAINE 5 % EX PTCH: 1.0000 | MEDICATED_PATCH | CUTANEOUS | 0 refills | Status: DC

## 2017-09-10 MED ORDER — GABAPENTIN 100 MG PO CAPS: 100.0000 mg | ORAL_CAPSULE | Freq: Every day | ORAL | 3 refills | Status: DC

## 2017-09-10 NOTE — Assessment & Plan Note (Signed)
Symptoms consistent with postherpetic neuralgia.  She did not notice any rash We will treat with gabapentin at bedtime-titrate if tolerated Has discontinued Lyrica Lidocaine patch If her symptoms are not improving I recommend she consider seeing sports medicine for further evaluation for an atypical muscular skeletal cause She will call if her symptoms are not improving

## 2017-09-10 NOTE — Progress Notes (Signed)
Subjective:    Patient ID: [name], female    DOB: [birth year], 62 y.o.   MRN: [id number]  HPI The patient is here for an acute visit.   Right shoulder pain:  It started 3 weeks ago.  She denies injury or new activity.  It was initially intermittent but is now constant.  It is a dull, burning pain.  She denies any rash.  The main focus of pain is right upper arm, but her pain extends to her upper back, upper chest and right thorax.  It wakes her up.  It hurts lifting things.  She has tried tylenol and tyelnol pm.  Occasionally she will feel neck pain and stiffness.  She does state any sensitivity to her skin.  She does feel tired, but has had a lot of stress recently-her husband recently died.  She is not taking her Lyrica for 3 days and has not noticed change in this pain.    Medications and allergies reviewed with patient and updated if appropriate.  Patient Active Problem List   Diagnosis Date Noted  . Chronic constipation 02/24/2017  . Prediabetes 02/10/2017  . Vitamin D deficiency 02/10/2017  . Auricular cyst 10/10/2016  . Dysuria 09/03/2016  . Colon cancer screening 09/03/2016  . Sleep disorder 10/16/2015  . Constipation 10/16/2015  . Routine general medical examination at a health care facility 02/08/2014  . Obesity (BMI 30-39.9) 11/10/2013  . Obesity, Class II, BMI 35-39.9, with comorbidity 01/28/2013  . DJD (degenerative joint disease) of knee 11/08/2011  . Persistent disorder of initiating or maintaining sleep 09/30/2011  . BACK PAIN, LUMBAR, WITH RADICULOPATHY 08/09/2009  . HYPERTENSION, BENIGN ESSENTIAL 08/03/2009  . FATTY LIVER DISEASE 10/25/2008  . Fibromyalgia muscle pain 10/25/2008  . Diabetes type 2, controlled ([place]) 05/25/2008  . Hyperlipidemia with target LDL less than 100 05/25/2008  . GERD 05/25/2008  . IBS (irritable colon syndrome) 05/25/2008    Current Outpatient Medications on File Prior to Visit  Medication Sig Dispense Refill  .  buPROPion (WELLBUTRIN SR) 150 MG 12 hr tablet Take 1 tablet (150 mg total) daily by mouth. 30 tablet 0  . hydrochlorothiazide (HYDRODIURIL) 25 MG tablet TAKE 1 TABLET BY MOUTH DAILY. 90 tablet 0  . LINZESS 290 MCG CAPS capsule TAKE 1 CAPSULE (290 MCG TOTAL) BY MOUTH DAILY BEFORE BREAKFAST. 90 capsule 1  . LYRICA 75 MG capsule TAKE 1 CAPSULE BY MOUTH EVERYDAY AT BEDTIME 90 capsule 1  . metFORMIN (GLUCOPHAGE) 500 MG tablet Take 1 tablet (500 mg total) 2 (two) times daily by mouth. 60 tablet 0  . Multiple Vitamin (MULTI-VITAMINS) TABS Take by mouth.    . pantoprazole (PROTONIX) 40 MG tablet Take 1 tablet (40 mg total) by mouth 2 (two) times daily. 60 tablet 11  . Pitavastatin Calcium (LIVALO) 4 MG TABS Take 1 tablet (4 mg total) by mouth daily. 90 tablet 3  . polyethylene glycol (MIRALAX) packet Take 17 g by mouth daily. Take 1/2 daily 1 each 0  . Probiotic Product (PROBIOTIC PO) 1 tablet daily.     . Vitamin D, Ergocalciferol, (DRISDOL) 50000 units CAPS capsule Take 1 capsule (50,000 Units total) every 7 (seven) days by mouth. 4 capsule 0   Current Facility-Administered Medications on File Prior to Visit  Medication Dose Route Frequency Provider Last Rate Last Dose  . 0.9 %  sodium chloride infusion  500 mL Intravenous Continuous Ladene Artist, MD        Past Medical History:  Diagnosis Date  . Arthritis   . Back pain   . Benign fundic gland polyps of stomach   . Constipation   . Diabetes mellitus without complication ([place])   . Esophagitis    LA Class B  . Fatigue   . Fatty liver   . Fatty liver   . Fibromyalgia   . Gallbladder problem   . GERD (gastroesophageal reflux disease)   . Heart murmur   . Hyperlipidemia   . Hypertension   . IBS (irritable bowel syndrome)   . Joint pain   . Neuromuscular disorder ([place])   . Prediabetes     Past Surgical History:  Procedure Laterality Date  . CHOLECYSTECTOMY  2009   low GB EF, no gallstones  . COLONOSCOPY    . LAPAROSCOPY     of  adhesions/ of ovaries  . UPPER GASTROINTESTINAL ENDOSCOPY    . VAGINAL HYSTERECTOMY      Social History   Socioeconomic History  . Marital status: Married    Spouse name: Not on file  . Number of children: Not on file  . Years of education: Not on file  . Highest education level: Not on file  Occupational History  . 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 Financial resource strain: Not on file  . Food insecurity:    Worry: Not on file    Inability: Not on file  . Transportation needs:    Medical: Not on file    Non-medical: Not on file  Tobacco Use  . Smoking status: Former Smoker    Packs/day: 0.10    Years: 10.00    Pack years: 1.00    Types: Cigarettes    Last attempt to quit: 07/14/1981    Years since quitting: 36.1  . Smokeless tobacco: Never Used  Substance and Sexual Activity  . Alcohol use: No  . Drug use: No  . Sexual activity: Not Currently  Lifestyle  . Physical activity:    Days per week: Not on file    Minutes per session: Not on file  . Stress: Not on file  Relationships  . Social connections:    Talks on phone: Not on file    Gets together: Not on file    Attends religious service: Not on file    Active member of club or organization: Not on file    Attends meetings of clubs or organizations: Not on file    Relationship status: Not on file  Other Topics Concern  . Not on file  Social History Narrative  . Not on file    Family History  Problem Relation Age of Onset  . Colon cancer Father   . Hypertension Father   . Hyperlipidemia Father   . Obesity Father   . Heart disease Mother   . Hypertension Mother   . Hyperlipidemia Mother   . Obesity Mother   . Diabetes Brother        Half  . Heart disease Brother   . Diabetes Sister   . Breast cancer Paternal Grandmother   . Esophageal cancer Neg Hx   . Stomach cancer Neg Hx   . Rectal cancer Neg Hx     Review of Systems  Constitutional: Positive for fatigue.  Negative for appetite change and fever.  Musculoskeletal: Positive for arthralgias (right arm) and neck stiffness (Sometimes).  Skin: Negative for color change, rash and wound.  Neurological: Positive for numbness.  Hypersensitivity to affected areas       Objective:   Vitals:   09/10/17 1430  BP: 124/82  Pulse: 70  Temp: 98 F (36.7 C)  SpO2: 98%   BP Readings from Last 3 Encounters:  09/10/17 124/82  04/14/17 122/78  03/12/17 119/77   Wt Readings from Last 3 Encounters:  09/10/17 238 lb (108 kg)  04/14/17 235 lb (106.6 kg)  03/12/17 236 lb (107 kg)   Body mass index is 37.28 kg/m.   Physical Exam  Constitutional: She is oriented to person, place, and time. She appears well-developed and well-nourished. No distress.  HENT:  Head: Normocephalic and atraumatic.  Musculoskeletal: She exhibits no edema.  Normal range of motion of neck, right shoulder, elbow wrist and hand; no obvious increase in pain with movement of right arm, no tenderness with palpation of right shoulder, but affected areas are hypersensitive and somewhat tender  Neurological: She is alert and oriented to person, place, and time.  Skin hypersensitive right arm, thorax; normal strength right hand  Skin: Skin is warm and dry. No rash noted. She is not diaphoretic. No erythema.  Skin right arm, upper chest, back and thorax is sensitive to touch -hypersensitive           Assessment & Plan:    See Problem List for Assessment and Plan of chronic medical problems.

## 2017-09-10 NOTE — Patient Instructions (Signed)
Take the gabapentin at night  - increase as tolerated.   Use the lidocaine patch.  You can take advil and or tylenol.   Call if no improvement

## 2017-09-11 ENCOUNTER — Telehealth: Payer: Self-pay

## 2017-09-11 NOTE — Telephone Encounter (Signed)
Key: [id number] - Rx #: [id number]   Lidoderm patches approved today via Cover My Meds.  Effective from 09/11/2017 through 09/10/2019.

## 2017-10-13 DIAGNOSIS — Z803 Family history of malignant neoplasm of breast: Secondary | ICD-10-CM | POA: Diagnosis not present

## 2017-10-13 DIAGNOSIS — Z800 Family history of malignant neoplasm of digestive organs: Secondary | ICD-10-CM | POA: Diagnosis not present

## 2017-10-13 DIAGNOSIS — Z6836 Body mass index (BMI) 36.0-36.9, adult: Secondary | ICD-10-CM | POA: Diagnosis not present

## 2017-10-13 DIAGNOSIS — Z8051 Family history of malignant neoplasm of kidney: Secondary | ICD-10-CM | POA: Diagnosis not present

## 2017-10-13 DIAGNOSIS — Z01419 Encounter for gynecological examination (general) (routine) without abnormal findings: Secondary | ICD-10-CM | POA: Diagnosis not present

## 2017-10-23 DIAGNOSIS — M5136 Other intervertebral disc degeneration, lumbar region: Secondary | ICD-10-CM | POA: Diagnosis not present

## 2017-10-23 DIAGNOSIS — M545 Low back pain: Secondary | ICD-10-CM | POA: Diagnosis not present

## 2017-10-23 DIAGNOSIS — M4316 Spondylolisthesis, lumbar region: Secondary | ICD-10-CM | POA: Diagnosis not present

## 2017-10-28 DIAGNOSIS — R102 Pelvic and perineal pain: Secondary | ICD-10-CM | POA: Diagnosis not present

## 2017-10-28 DIAGNOSIS — Z1321 Encounter for screening for nutritional disorder: Secondary | ICD-10-CM | POA: Diagnosis not present

## 2017-10-28 DIAGNOSIS — Z13228 Encounter for screening for other metabolic disorders: Secondary | ICD-10-CM | POA: Diagnosis not present

## 2017-10-28 DIAGNOSIS — E669 Obesity, unspecified: Secondary | ICD-10-CM | POA: Diagnosis not present

## 2017-10-28 DIAGNOSIS — I272 Pulmonary hypertension, unspecified: Secondary | ICD-10-CM | POA: Diagnosis not present

## 2017-10-28 DIAGNOSIS — Z1329 Encounter for screening for other suspected endocrine disorder: Secondary | ICD-10-CM | POA: Diagnosis not present

## 2017-10-28 DIAGNOSIS — Z13220 Encounter for screening for lipoid disorders: Secondary | ICD-10-CM | POA: Diagnosis not present

## 2017-10-28 DIAGNOSIS — E785 Hyperlipidemia, unspecified: Secondary | ICD-10-CM | POA: Diagnosis not present

## 2017-11-11 ENCOUNTER — Other Ambulatory Visit: Payer: Self-pay | Admitting: Internal Medicine

## 2017-11-15 ENCOUNTER — Other Ambulatory Visit: Payer: Self-pay | Admitting: Internal Medicine

## 2017-11-18 ENCOUNTER — Encounter: Payer: Self-pay | Admitting: Family

## 2017-11-18 ENCOUNTER — Ambulatory Visit: Payer: BLUE CROSS/BLUE SHIELD | Admitting: Family

## 2017-11-18 ENCOUNTER — Other Ambulatory Visit (INDEPENDENT_AMBULATORY_CARE_PROVIDER_SITE_OTHER): Payer: BLUE CROSS/BLUE SHIELD

## 2017-11-18 VITALS — BP 124/80 | HR 85 | Temp 98.3°F | Ht 67.0 in | Wt 244.0 lb

## 2017-11-18 DIAGNOSIS — R7303 Prediabetes: Secondary | ICD-10-CM | POA: Diagnosis not present

## 2017-11-18 DIAGNOSIS — E559 Vitamin D deficiency, unspecified: Secondary | ICD-10-CM

## 2017-11-18 DIAGNOSIS — R5383 Other fatigue: Secondary | ICD-10-CM

## 2017-11-18 DIAGNOSIS — R300 Dysuria: Secondary | ICD-10-CM

## 2017-11-18 LAB — COMPREHENSIVE METABOLIC PANEL
ALT: 18 U/L (ref 0–35)
AST: 9 U/L (ref 0–37)
Albumin: 4.2 g/dL (ref 3.5–5.2)
Alkaline Phosphatase: 68 U/L (ref 39–117)
BILIRUBIN TOTAL: 0.4 mg/dL (ref 0.2–1.2)
BUN: 14 mg/dL (ref 6–23)
CALCIUM: 9.6 mg/dL (ref 8.4–10.5)
CO2: 28 meq/L (ref 19–32)
CREATININE: 0.78 mg/dL (ref 0.40–1.20)
Chloride: 102 mEq/L (ref 96–112)
GFR: 95.94 mL/min (ref >60.00)
GLUCOSE: 133 mg/dL — AB (ref 70–99)
Potassium: 3.9 mEq/L (ref 3.5–5.1)
Sodium: 138 mEq/L (ref 135–145)
Total Protein: 7.5 g/dL (ref 6.0–8.3)

## 2017-11-18 LAB — CBC WITH DIFFERENTIAL/PLATELET
Basophils Absolute: 0 10*3/uL (ref 0.0–0.1)
Basophils Relative: 0.4 % (ref 0.0–3.0)
EOS ABS: 0.1 10*3/uL (ref 0.0–0.7)
Eosinophils Relative: 1.3 % (ref 0.0–5.0)
HCT: 38.7 % (ref 36.0–46.0)
Hemoglobin: 13.9 g/dL (ref 12.0–15.0)
LYMPHS ABS: 2.6 10*3/uL (ref 0.7–4.0)
LYMPHS PCT: 48 % — AB (ref 12.0–46.0)
MCHC: 35.8 g/dL (ref 30.0–36.0)
MCV: 86 fl (ref 78.0–100.0)
MONO ABS: 0.6 10*3/uL (ref 0.1–1.0)
Monocytes Relative: 11 % (ref 3.0–12.0)
NEUTROS ABS: 2.2 10*3/uL (ref 1.4–7.7)
NEUTROS PCT: 39.3 % — AB (ref 43.0–77.0)
PLATELETS: 228 10*3/uL (ref 150.0–400.0)
RBC: 4.51 Mil/uL (ref 3.87–5.11)
RDW: 14.7 % (ref 11.5–15.5)
WBC: 5.5 10*3/uL (ref 4.0–10.5)

## 2017-11-18 LAB — HEMOGLOBIN A1C: Hgb A1c MFr Bld: 6.5 % (ref 4.6–6.5)

## 2017-11-18 LAB — VITAMIN D 25 HYDROXY (VIT D DEFICIENCY, FRACTURES): VITD: 34.88 ng/mL (ref 30.00–100.00)

## 2017-11-18 LAB — POC URINALSYSI DIPSTICK (AUTOMATED)
Bilirubin, UA: NEGATIVE
Glucose, UA: NEGATIVE
Ketones, UA: NEGATIVE
Leukocytes, UA: NEGATIVE
PH UA: 6 (ref 5.0–8.0)
Protein, UA: POSITIVE — AB
Spec Grav, UA: >=1.03 — AB (ref 1.010–1.025)
UROBILINOGEN UA: 0.2 U/dL

## 2017-11-18 LAB — VITAMIN B12: Vitamin B-12: 789 pg/mL (ref 211–911)

## 2017-11-18 LAB — TSH: TSH: 0.44 u[IU]/mL (ref 0.35–4.50)

## 2017-11-18 MED ORDER — SULFAMETHOXAZOLE-TRIMETHOPRIM 800-160 MG PO TABS: 1.0000 | ORAL_TABLET | Freq: Two times a day (BID) | ORAL | 0 refills | Status: DC

## 2017-11-18 NOTE — Progress Notes (Signed)
[name] is a 62 y.o. female with the following history as recorded in [employer]:  Patient Active Problem List   Diagnosis Date Noted  . Post herpetic neuralgia 09/10/2017  . Chronic constipation 02/24/2017  . Prediabetes 02/10/2017  . Vitamin D deficiency 02/10/2017  . Auricular cyst 10/10/2016  . Dysuria 09/03/2016  . Colon cancer screening 09/03/2016  . Sleep disorder 10/16/2015  . Constipation 10/16/2015  . Routine general medical examination at a health care facility 02/08/2014  . Obesity (BMI 30-39.9) 11/10/2013  . Obesity, Class II, BMI 35-39.9, with comorbidity 01/28/2013  . DJD (degenerative joint disease) of knee 11/08/2011  . Persistent disorder of initiating or maintaining sleep 09/30/2011  . BACK PAIN, LUMBAR, WITH RADICULOPATHY 08/09/2009  . HYPERTENSION, BENIGN ESSENTIAL 08/03/2009  . FATTY LIVER DISEASE 10/25/2008  . Fibromyalgia muscle pain 10/25/2008  . Diabetes type 2, controlled ([place]) 05/25/2008  . Hyperlipidemia with target LDL less than 100 05/25/2008  . GERD 05/25/2008  . IBS (irritable colon syndrome) 05/25/2008    Current Outpatient Medications  Medication Sig Dispense Refill  . buPROPion (WELLBUTRIN SR) 150 MG 12 hr tablet Take 1 tablet (150 mg total) daily by mouth. 30 tablet 0  . hydrochlorothiazide (HYDRODIURIL) 25 MG tablet TAKE 1 TABLET BY MOUTH DAILY. 90 tablet 0  . lidocaine (LIDODERM) 5 % Place 1 patch onto the skin daily. Remove & Discard patch within 12 hours or as directed by MD. For post herpetic neuralgia 30 patch 0  . LINZESS 290 MCG CAPS capsule TAKE 1 CAPSULE (290 MCG TOTAL) BY MOUTH DAILY BEFORE BREAKFAST. 90 capsule 1  . LYRICA 75 MG capsule TAKE 1 CAPSULE BY MOUTH EVERYDAY AT BEDTIME  1  . metFORMIN (GLUCOPHAGE) 500 MG tablet Take 1 tablet (500 mg total) 2 (two) times daily by mouth. 60 tablet 0  . Multiple Vitamin (MULTI-VITAMINS) TABS Take by mouth.    . pantoprazole (PROTONIX) 40 MG tablet Take 1 tablet (40 mg total) by mouth  2 (two) times daily. 60 tablet 11  . Pitavastatin Calcium (LIVALO) 4 MG TABS Take 1 tablet (4 mg total) by mouth daily. 90 tablet 3  . polyethylene glycol (MIRALAX) packet Take 17 g by mouth daily. Take 1/2 daily 1 each 0  . Probiotic Product (PROBIOTIC PO) 1 tablet daily.     Marland Kitchen sulfamethoxazole-trimethoprim (BACTRIM DS,SEPTRA DS) 800-160 MG tablet Take 1 tablet by mouth 2 (two) times daily. 10 tablet 0   Current Facility-Administered Medications  Medication Dose Route Frequency Provider Last Rate Last Dose  . 0.9 %  sodium chloride infusion  500 mL Intravenous Continuous Ladene Artist, MD        Allergies: Crestor [rosuvastatin calcium]  Past Medical History:  Diagnosis Date  . Arthritis   . Back pain   . Benign fundic gland polyps of stomach   . Constipation   . Diabetes mellitus without complication ([place])   . Esophagitis    LA Class B  . Fatigue   . Fatty liver   . Fatty liver   . Fibromyalgia   . Gallbladder problem   . GERD (gastroesophageal reflux disease)   . Heart murmur   . Hyperlipidemia   . Hypertension   . IBS (irritable bowel syndrome)   . Joint pain   . Neuromuscular disorder ([place])   . Prediabetes     Past Surgical History:  Procedure Laterality Date  . CHOLECYSTECTOMY  2009   low GB EF, no gallstones  . COLONOSCOPY    .  LAPAROSCOPY     of adhesions/ of ovaries  . UPPER GASTROINTESTINAL ENDOSCOPY    . VAGINAL HYSTERECTOMY      Family History  Problem Relation Age of Onset  . Colon cancer Father   . Hypertension Father   . Hyperlipidemia Father   . Obesity Father   . Heart disease Mother   . Hypertension Mother   . Hyperlipidemia Mother   . Obesity Mother   . Diabetes Brother        Half  . Heart disease Brother   . Diabetes Sister   . Breast cancer Paternal Grandmother   . Esophageal cancer Neg Hx   . Stomach cancer Neg Hx   . Rectal cancer Neg Hx     Social History   Tobacco Use  . Smoking status: Former Smoker    Packs/day: 0.10     Years: 10.00    Pack years: 1.00    Types: Cigarettes    Last attempt to quit: 07/14/1981    Years since quitting: 36.3  . Smokeless tobacco: Never Used  Substance Use Topics  . Alcohol use: No    Subjective:  Patient presents with concerns for UTI; + burning, frequency; has felt feverish; + nausea yesterday; + pelvic pain; saw blood on one occasion; not prone to UTIs;  Also requesting updated labs today specifically  Vitamin D due to chronic fatigue; admits that she does not sleep well to pain from fibromyalgia; does not like Gabapentin; prefers to stay on Lyrica but not getting much benefit at 75 mg daily;   Objective:  Vitals:   11/18/17 1523  BP: 124/80  Pulse: 85  Temp: 98.3 F (36.8 C)  TempSrc: Oral  SpO2: 97%  Weight: 244 lb (110.7 kg)  Height: 5' 7" (1.702 m)    General: Well developed, well nourished, in no acute distress  Skin : Warm and dry.  Head: Normocephalic and atraumatic  Lungs: Respirations unlabored; clear to auscultation bilaterally without wheeze, rales, rhonchi  CVS exam: normal rate and regular rhythm.  Neurologic: Alert and oriented; speech intact; face symmetrical; moves all extremities well; CNII-XII intact without focal deficit  Assessment:  1. Dysuria   2. Other fatigue   3. Vitamin D deficiency   4. Pre-diabetes     Plan:  1. Check U/A and urine culture today; Rx for Bactrim DS bid x 5 days; follow-up to be determined. 2. Update labs today; patient is to try taking Lyrica 75 mg bid; she could also try 150 mg qhs if needed; follow-up to be determined. 3. Check Vitamin D level today; 4. Check Hgba1c today; opting not to work with weight loss specialist at this time.    No follow-ups on file.  Orders Placed This Encounter  Procedures  . Urine Culture    Standing Status:   Future    Number of Occurrences:   1    Standing Expiration Date:   11/18/2018  . CBC w/Diff    Standing Status:   Future    Number of Occurrences:   1    Standing  Expiration Date:   11/18/2018  . Comp Met (CMET)    Standing Status:   Future    Number of Occurrences:   1    Standing Expiration Date:   11/18/2018  . B12    Standing Status:   Future    Number of Occurrences:   1    Standing Expiration Date:   11/18/2018  . TSH  Standing Status:   Future    Number of Occurrences:   1    Standing Expiration Date:   11/18/2018  . Vitamin D (25 hydroxy)    Standing Status:   Future    Number of Occurrences:   1    Standing Expiration Date:   11/18/2018  . HgB A1c    Standing Status:   Future    Number of Occurrences:   1    Standing Expiration Date:   11/18/2018  . POCT Urinalysis Dipstick (Automated)    Requested Prescriptions   Signed Prescriptions Disp Refills  . sulfamethoxazole-trimethoprim (BACTRIM DS,SEPTRA DS) 800-160 MG tablet 10 tablet 0    Sig: Take 1 tablet by mouth 2 (two) times daily.

## 2017-11-20 ENCOUNTER — Encounter: Payer: Self-pay | Admitting: Family

## 2017-11-20 LAB — URINE CULTURE
MICRO NUMBER: 90729928
SPECIMEN QUALITY: ADEQUATE

## 2017-11-25 DIAGNOSIS — Z1231 Encounter for screening mammogram for malignant neoplasm of breast: Secondary | ICD-10-CM | POA: Diagnosis not present

## 2017-11-25 DIAGNOSIS — Z13820 Encounter for screening for osteoporosis: Secondary | ICD-10-CM | POA: Diagnosis not present

## 2017-12-11 ENCOUNTER — Other Ambulatory Visit: Payer: Self-pay | Admitting: Internal Medicine

## 2017-12-16 ENCOUNTER — Ambulatory Visit: Payer: BLUE CROSS/BLUE SHIELD | Admitting: Internal Medicine

## 2017-12-25 LAB — HM MAMMOGRAPHY

## 2018-01-07 ENCOUNTER — Other Ambulatory Visit (INDEPENDENT_AMBULATORY_CARE_PROVIDER_SITE_OTHER): Payer: BLUE CROSS/BLUE SHIELD

## 2018-01-07 ENCOUNTER — Ambulatory Visit: Payer: BLUE CROSS/BLUE SHIELD | Admitting: Internal Medicine

## 2018-01-07 ENCOUNTER — Encounter: Payer: Self-pay | Admitting: Internal Medicine

## 2018-01-07 VITALS — BP 116/76 | HR 74 | Temp 98.1°F | Ht 67.0 in | Wt 237.0 lb

## 2018-01-07 DIAGNOSIS — K5904 Chronic idiopathic constipation: Secondary | ICD-10-CM | POA: Diagnosis not present

## 2018-01-07 DIAGNOSIS — N390 Urinary tract infection, site not specified: Secondary | ICD-10-CM | POA: Insufficient documentation

## 2018-01-07 DIAGNOSIS — B9620 Unspecified Escherichia coli [E. coli] as the cause of diseases classified elsewhere: Secondary | ICD-10-CM | POA: Diagnosis not present

## 2018-01-07 DIAGNOSIS — E785 Hyperlipidemia, unspecified: Secondary | ICD-10-CM

## 2018-01-07 DIAGNOSIS — G8929 Other chronic pain: Secondary | ICD-10-CM

## 2018-01-07 DIAGNOSIS — E1121 Type 2 diabetes mellitus with diabetic nephropathy: Secondary | ICD-10-CM

## 2018-01-07 DIAGNOSIS — E559 Vitamin D deficiency, unspecified: Secondary | ICD-10-CM

## 2018-01-07 DIAGNOSIS — G4701 Insomnia due to medical condition: Secondary | ICD-10-CM

## 2018-01-07 LAB — LIPID PANEL
Cholesterol: 266 mg/dL — ABNORMAL HIGH (ref 0–200)
HDL: 50.9 mg/dL (ref >39.00)
NONHDL: 214.62
Total CHOL/HDL Ratio: 5
Triglycerides: 225 mg/dL — ABNORMAL HIGH (ref 0.0–149.0)
VLDL: 45 mg/dL — ABNORMAL HIGH (ref 0.0–40.0)

## 2018-01-07 LAB — POCT GLUCOSE (DEVICE FOR HOME USE): GLUCOSE FASTING, POC: 112 mg/dL — AB (ref 70–99)

## 2018-01-07 LAB — POCT GLYCOSYLATED HEMOGLOBIN (HGB A1C): HEMOGLOBIN A1C: 6.6 % — AB (ref 4.0–5.6)

## 2018-01-07 LAB — LDL CHOLESTEROL, DIRECT: LDL DIRECT: 187 mg/dL

## 2018-01-07 MED ORDER — PRUCALOPRIDE SUCCINATE 2 MG PO TABS: 1.0000 | ORAL_TABLET | Freq: Every day | ORAL | 1 refills | Status: DC

## 2018-01-07 MED ORDER — SUVOREXANT 20 MG PO TABS: 1.0000 | ORAL_TABLET | Freq: Every day | ORAL | 5 refills | Status: DC | PRN

## 2018-01-07 MED ORDER — PITAVASTATIN CALCIUM 4 MG PO TABS: 1.0000 | ORAL_TABLET | Freq: Every day | ORAL | 1 refills | Status: DC

## 2018-01-07 NOTE — Patient Instructions (Signed)

## 2018-01-07 NOTE — Progress Notes (Signed)
Subjective:  Patient ID: [name], female    DOB: 12/30/1955  Age: 62 y.o. MRN: [id number]  CC: Hypertension; Hyperlipidemia; Diabetes; and Urinary Tract Infection   HPI [name] presents for f/up- She continues to c/o fatigue, insomnia due to pain, muscle aches, constipation and frequent urination. She tells me that linzess has not helped with the constipation. She was treated for an E coli UTI a few weeks ago.  She is not taking the statin.  She says it did not cause any side effects when she did take it but she is just been forgetting to take it.  Outpatient Medications Prior to Visit  Medication Sig Dispense Refill  . hydrochlorothiazide (HYDRODIURIL) 25 MG tablet TAKE 1 TABLET BY MOUTH DAILY. 90 tablet 1  . lidocaine (LIDODERM) 5 % Place 1 patch onto the skin daily. Remove & Discard patch within 12 hours or as directed by MD. For post herpetic neuralgia 30 patch 0  . LYRICA 75 MG capsule TAKE 1 CAPSULE BY MOUTH EVERYDAY AT BEDTIME  1  . pantoprazole (PROTONIX) 40 MG tablet Take 1 tablet (40 mg total) by mouth 2 (two) times daily. 60 tablet 11  . buPROPion (WELLBUTRIN SR) 150 MG 12 hr tablet Take 1 tablet (150 mg total) daily by mouth. 30 tablet 0  . LINZESS 290 MCG CAPS capsule TAKE 1 CAPSULE (290 MCG TOTAL) BY MOUTH DAILY BEFORE BREAKFAST. 90 capsule 1  . metFORMIN (GLUCOPHAGE) 500 MG tablet Take 1 tablet (500 mg total) 2 (two) times daily by mouth. 60 tablet 0  . Multiple Vitamin (MULTI-VITAMINS) TABS Take by mouth.    . Pitavastatin Calcium (LIVALO) 4 MG TABS Take 1 tablet (4 mg total) by mouth daily. 90 tablet 3  . polyethylene glycol (MIRALAX) packet Take 17 g by mouth daily. Take 1/2 daily 1 each 0  . Probiotic Product (PROBIOTIC PO) 1 tablet daily.     Marland Kitchen sulfamethoxazole-trimethoprim (BACTRIM DS,SEPTRA DS) 800-160 MG tablet Take 1 tablet by mouth 2 (two) times daily. 10 tablet 0   No facility-administered medications prior to visit.     ROS Review of Systems    Constitutional: Positive for fatigue. Negative for unexpected weight change.  HENT: Negative.  Negative for trouble swallowing.   Eyes: Negative for visual disturbance.  Respiratory: Negative.  Negative for apnea, cough, chest tightness, shortness of breath and wheezing.   Cardiovascular: Negative for chest pain, palpitations and leg swelling.  Gastrointestinal: Positive for constipation. Negative for abdominal pain, blood in stool, diarrhea, nausea and vomiting.  Endocrine: Positive for polyuria. Negative for polydipsia and polyphagia.  Genitourinary: Positive for frequency. Negative for decreased urine volume, difficulty urinating, dysuria, flank pain, hematuria and urgency.  Musculoskeletal: Positive for myalgias. Negative for arthralgias.  Skin: Negative.   Neurological: Negative.  Negative for dizziness, weakness, light-headedness and headaches.  Hematological: Negative for adenopathy. Does not bruise/bleed easily.  Psychiatric/Behavioral: Positive for sleep disturbance. Negative for decreased concentration, dysphoric mood and suicidal ideas. The patient is not nervous/anxious.     Objective:  BP 116/76 (BP Location: Left Arm, Patient Position: Sitting, Cuff Size: Large)   Pulse 74   Temp 98.1 F (36.7 C) (Oral)   Ht 5\' 7"  (1.702 m)   Wt 237 lb (107.5 kg)   SpO2 99%   BMI 37.12 kg/m   BP Readings from Last 3 Encounters:  01/07/18 116/76  11/18/17 124/80  09/10/17 124/82    Wt Readings from Last 3 Encounters:  01/07/18 237 lb (107.5  kg)  11/18/17 244 lb (110.7 kg)  09/10/17 238 lb (108 kg)    Physical Exam  Constitutional: She is oriented to person, place, and time. No distress.  HENT:  Mouth/Throat: Oropharynx is clear and moist. No oropharyngeal exudate.  Eyes: Conjunctivae are normal. No scleral icterus.  Neck: Normal range of motion. Neck supple. No JVD present. No thyromegaly present.  Cardiovascular: Normal rate, regular rhythm and normal heart sounds. Exam  reveals no gallop.  No murmur heard. Pulmonary/Chest: Effort normal and breath sounds normal. She has no wheezes. She has no rhonchi. She has no rales.  Abdominal: Soft. Bowel sounds are normal. She exhibits no mass. There is no hepatosplenomegaly. There is no tenderness. There is no tenderness at McBurney's point.  Musculoskeletal: Normal range of motion. She exhibits no edema or deformity.  Lymphadenopathy:    She has no cervical adenopathy.  Neurological: She is alert and oriented to person, place, and time.  Skin: She is not diaphoretic. No pallor.  Psychiatric: She has a normal mood and affect. Her behavior is normal. Judgment and thought content normal.  Vitals reviewed.   Lab Results  Component Value Date   WBC 5.5 11/18/2017   HGB 13.9 11/18/2017   HCT 38.7 11/18/2017   PLT 228.0 11/18/2017   GLUCOSE 133 (H) 11/18/2017   CHOL 266 (H) 01/07/2018   TRIG 225.0 (H) 01/07/2018   HDL 50.90 01/07/2018   LDLDIRECT 187.0 01/07/2018   LDLCALC 158 (H) 12/09/2016   ALT 18 11/18/2017   AST 9 11/18/2017   NA 138 11/18/2017   K 3.9 11/18/2017   CL 102 11/18/2017   CREATININE 0.78 11/18/2017   BUN 14 11/18/2017   CO2 28 11/18/2017   TSH 0.44 11/18/2017   HGBA1C 6.6 (A) 01/07/2018   MICROALBUR 5.0 (H) 10/16/2015    Ct Abdomen Pelvis W Contrast  Result Date: 11/07/2016 CLINICAL DATA:  Persistent abdominal pain, epigastric pain worse at night for several months, history GERD, hyperlipidemia, irritable bowel syndrome, fatty liver, diabetes mellitus, esophagitis, hypertension, former smoker EXAM: CT ABDOMEN AND PELVIS WITH CONTRAST TECHNIQUE: Multidetector CT imaging of the abdomen and pelvis was performed using the standard protocol following bolus administration of intravenous contrast. CONTRAST:  139mL ISOVUE-300 IOPAMIDOL (ISOVUE-300) INJECTION 61% IV. Dilute oral contrast. COMPARISON:  05/11/2010 FINDINGS: Lower chest: Lung bases clear Hepatobiliary: Diffuse hepatic steatosis.  Gallbladder surgically absent. No biliary dilatation or hepatic mass. Pancreas: Normal appearance Spleen: Normal appearance Adrenals/Urinary Tract: LEFT adrenal gland normal appearance. Fat attenuation mass within RIGHT adrenal gland compatible with myelolipoma 2.2 x 2.0 x 2.2 cm image 20. Tiny LEFT renal cyst. Kidneys, ureters, and bladder otherwise normal appearance Stomach/Bowel: Normal appendix. Stomach and bowel loops normal appearance Vascular/Lymphatic: Atherosclerotic calcifications aorta and iliac arteries. Aorta normal caliber. No adenopathy. Scattered pelvic phleboliths. Reproductive: Uterus surgically absent.  Normal sized ovaries. Other: Small umbilical hernia containing fat. No free air or free fluid. No acute inflammatory process identified. Musculoskeletal: No acute osseous findings. 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 Diffuse hepatic steatosis. Aortic atherosclerosis. Small umbilical hernia containing fat No acute abnormalities. Electronically Signed   By: Lavonia Dana M.D.   On: 11/07/2016 09:45    Assessment & Plan:   Trameka was seen today for hypertension, hyperlipidemia, diabetes and urinary tract infection.  Diagnoses and all orders for this visit:  E. coli UTI (urinary tract infection)- Will recheck a ur clx to be certain that the infection has been adequately treated -     CULTURE, URINE  COMPREHENSIVE; Future  Chronic idiopathic constipation- Will try a pro-motility agent -     Prucalopride Succinate (MOTEGRITY) 2 MG TABS; Take 1 tablet by mouth daily.  Hyperlipidemia with target LDL less than 100- She has not achieved her LDL goal.  I have encouraged her to restart the statin. -     Pitavastatin Calcium (LIVALO) 4 MG TABS; Take 1 tablet (4 mg total) by mouth daily. -     Lipid panel; Future  Controlled type 2 diabetes mellitus with diabetic nephropathy, without long-term current use of insulin ([place]) -her A1c is at 6.6%.  Her blood sugars are adequately  well controlled.  Medical therapy is not indicated. -     Pitavastatin Calcium (LIVALO) 4 MG TABS; Take 1 tablet (4 mg total) by mouth daily. -     HM Diabetes Foot Exam -     POCT glycosylated hemoglobin (Hb A1C) -     POCT Glucose (Device for Home Use)  Vitamin D deficiency  Insomnia secondary to chronic pain -     Suvorexant (BELSOMRA) 20 MG TABS; Take 1 tablet by mouth daily as needed.   I have discontinued Wyolene Weimann. Kleier's Probiotic Product (PROBIOTIC PO), polyethylene glycol, LINZESS, MULTI-VITAMINS, metFORMIN, buPROPion, and sulfamethoxazole-trimethoprim. I am also having her start on Suvorexant and Prucalopride Succinate. Additionally, I am having her maintain her pantoprazole, lidocaine, LYRICA, hydrochlorothiazide, and Pitavastatin Calcium.  Meds ordered this encounter  Medications  . Pitavastatin Calcium (LIVALO) 4 MG TABS    Sig: Take 1 tablet (4 mg total) by mouth daily.    Dispense:  90 tablet    Refill:  1  . Suvorexant (BELSOMRA) 20 MG TABS    Sig: Take 1 tablet by mouth daily as needed.    Dispense:  30 tablet    Refill:  5  . Prucalopride Succinate (MOTEGRITY) 2 MG TABS    Sig: Take 1 tablet by mouth daily.    Dispense:  90 tablet    Refill:  1     Follow-up: Return in about 4 years (around 09/07/2021).  Scarlette Calico, MD

## 2018-01-08 LAB — CULTURE, URINE COMPREHENSIVE
MICRO NUMBER:: 90934612
SPECIMEN QUALITY:: ADEQUATE

## 2018-01-09 ENCOUNTER — Encounter: Payer: Self-pay | Admitting: Internal Medicine

## 2018-01-31 ENCOUNTER — Other Ambulatory Visit: Payer: Self-pay | Admitting: Internal Medicine

## 2018-02-03 ENCOUNTER — Other Ambulatory Visit: Payer: Self-pay | Admitting: Internal Medicine

## 2018-02-03 NOTE — Telephone Encounter (Signed)
Gabapentin refill was denied. Lyrica is listed instead. Sending this as an Pharmacist, hospital.

## 2018-03-31 DIAGNOSIS — E119 Type 2 diabetes mellitus without complications: Secondary | ICD-10-CM | POA: Diagnosis not present

## 2018-03-31 DIAGNOSIS — H40003 Preglaucoma, unspecified, bilateral: Secondary | ICD-10-CM | POA: Diagnosis not present

## 2018-03-31 DIAGNOSIS — H2513 Age-related nuclear cataract, bilateral: Secondary | ICD-10-CM | POA: Diagnosis not present

## 2018-03-31 DIAGNOSIS — H25013 Cortical age-related cataract, bilateral: Secondary | ICD-10-CM | POA: Diagnosis not present

## 2018-04-07 ENCOUNTER — Other Ambulatory Visit: Payer: Self-pay | Admitting: Internal Medicine

## 2018-04-07 DIAGNOSIS — K581 Irritable bowel syndrome with constipation: Secondary | ICD-10-CM

## 2018-04-09 ENCOUNTER — Other Ambulatory Visit: Payer: Self-pay | Admitting: Internal Medicine

## 2018-04-09 DIAGNOSIS — K581 Irritable bowel syndrome with constipation: Secondary | ICD-10-CM

## 2018-04-09 MED ORDER — LINACLOTIDE 290 MCG PO CAPS: 290.0000 ug | ORAL_CAPSULE | Freq: Every day | ORAL | 1 refills | Status: DC

## 2018-04-15 DIAGNOSIS — H2511 Age-related nuclear cataract, right eye: Secondary | ICD-10-CM | POA: Diagnosis not present

## 2018-04-15 DIAGNOSIS — H25011 Cortical age-related cataract, right eye: Secondary | ICD-10-CM | POA: Diagnosis not present

## 2018-04-15 DIAGNOSIS — H2512 Age-related nuclear cataract, left eye: Secondary | ICD-10-CM | POA: Diagnosis not present

## 2018-04-15 DIAGNOSIS — H25012 Cortical age-related cataract, left eye: Secondary | ICD-10-CM | POA: Diagnosis not present

## 2018-04-22 DIAGNOSIS — H2512 Age-related nuclear cataract, left eye: Secondary | ICD-10-CM | POA: Diagnosis not present

## 2018-04-22 DIAGNOSIS — H25012 Cortical age-related cataract, left eye: Secondary | ICD-10-CM | POA: Diagnosis not present

## 2018-06-17 ENCOUNTER — Other Ambulatory Visit: Payer: Self-pay | Admitting: Internal Medicine

## 2018-06-26 ENCOUNTER — Encounter: Payer: Self-pay | Admitting: Internal Medicine

## 2018-06-26 ENCOUNTER — Ambulatory Visit: Payer: BLUE CROSS/BLUE SHIELD | Admitting: Internal Medicine

## 2018-06-26 VITALS — BP 126/82 | HR 98 | Temp 99.0°F | Ht 67.0 in | Wt 238.0 lb

## 2018-06-26 DIAGNOSIS — E1121 Type 2 diabetes mellitus with diabetic nephropathy: Secondary | ICD-10-CM

## 2018-06-26 DIAGNOSIS — R6889 Other general symptoms and signs: Secondary | ICD-10-CM

## 2018-06-26 DIAGNOSIS — J069 Acute upper respiratory infection, unspecified: Secondary | ICD-10-CM | POA: Diagnosis not present

## 2018-06-26 DIAGNOSIS — I10 Essential (primary) hypertension: Secondary | ICD-10-CM | POA: Diagnosis not present

## 2018-06-26 LAB — POCT INFLUENZA A/B
Influenza A, POC: NEGATIVE
Influenza B, POC: NEGATIVE

## 2018-06-26 MED ORDER — HYDROCODONE-HOMATROPINE 5-1.5 MG/5ML PO SYRP: 5.0000 mL | ORAL_SOLUTION | Freq: Four times a day (QID) | ORAL | 0 refills | Status: AC | PRN

## 2018-06-26 MED ORDER — DOXYCYCLINE HYCLATE 100 MG PO TABS: 100.0000 mg | ORAL_TABLET | Freq: Two times a day (BID) | ORAL | 0 refills | Status: DC

## 2018-06-26 NOTE — Assessment & Plan Note (Signed)
Mild to mod, for antibx course,  to f/u any worsening symptoms or concerns 

## 2018-06-26 NOTE — Assessment & Plan Note (Signed)
stable overall by history and exam, recent data reviewed with pt, and pt to continue medical treatment as before,  to f/u any worsening symptoms or concerns  

## 2018-06-26 NOTE — Progress Notes (Signed)
Subjective:    Patient ID: [name], female    DOB: [birth year], 63 y.o.   MRN: [id number]  HPI   Here with 2-3 days acute onset fever, facial pain, pressure, headache, general weakness and malaise, and greenish d/c, with mild ST and cough, but pt denies chest pain, wheezing, increased sob or doe, orthopnea, PND, increased LE swelling, palpitations, dizziness or syncope.  A grandchild was flu+ last wk, but not sure if was around enough to be a contact.  Pt denies new neurological symptoms such as new headache, or facial or extremity weakness or numbness   Pt denies polydipsia, polyuria Past Medical History:  Diagnosis Date  . Arthritis   . Back pain   . Benign fundic gland polyps of stomach   . Constipation   . Diabetes mellitus without complication ([place])   . Esophagitis    LA Class B  . Fatigue   . Fatty liver   . Fatty liver   . Fibromyalgia   . Gallbladder problem   . GERD (gastroesophageal reflux disease)   . Heart murmur   . Hyperlipidemia   . Hypertension   . IBS (irritable bowel syndrome)   . Joint pain   . Neuromuscular disorder ([place])   . Prediabetes    Past Surgical History:  Procedure Laterality Date  . CHOLECYSTECTOMY  2009   low GB EF, no gallstones  . COLONOSCOPY    . LAPAROSCOPY     of adhesions/ of ovaries  . UPPER GASTROINTESTINAL ENDOSCOPY    . VAGINAL HYSTERECTOMY      reports that she quit smoking about 36 years ago. Her smoking use included cigarettes. She has a 1.00 pack-year smoking history. She has never used smokeless tobacco. She reports that she does not drink alcohol or use drugs. family history includes Breast cancer in her paternal grandmother; Colon cancer in her father; Diabetes in her brother and sister; Heart disease in her brother and mother; Hyperlipidemia in her father and mother; Hypertension in her father and mother; Obesity in her father and mother. Allergies  Allergen Reactions  . Crestor [Rosuvastatin Calcium]     Muscle  aches   Current Outpatient Medications on File Prior to Visit  Medication Sig Dispense Refill  . hydrochlorothiazide (HYDRODIURIL) 25 MG tablet TAKE 1 TABLET BY MOUTH DAILY. 90 tablet 1  . lidocaine (LIDODERM) 5 % Place 1 patch onto the skin daily. Remove & Discard patch within 12 hours or as directed by MD. For post herpetic neuralgia 30 patch 0  . linaclotide (LINZESS) 290 MCG CAPS capsule Take 1 capsule (290 mcg total) by mouth daily before breakfast. 90 capsule 1  . LYRICA 75 MG capsule TAKE 1 CAPSULE BY MOUTH EVERYDAY AT BEDTIME  1  . pantoprazole (PROTONIX) 40 MG tablet Take 1 tablet (40 mg total) by mouth 2 (two) times daily. 60 tablet 11  . Pitavastatin Calcium (LIVALO) 4 MG TABS Take 1 tablet (4 mg total) by mouth daily. 90 tablet 1  . Suvorexant (BELSOMRA) 20 MG TABS Take 1 tablet by mouth daily as needed. 30 tablet 5   No current facility-administered medications on file prior to visit.    ROS:  Constitutional: Negative for other unusual diaphoresis or sweats HENT: Negative for ear discharge or swelling Eyes: Negative for other worsening visual disturbances Respiratory: Negative for stridor or other swelling  Gastrointestinal: Negative for worsening distension or other blood Genitourinary: Negative for retention or other urinary change Musculoskeletal: Negative for other MSK pain  or swelling Skin: Negative for color change or other new lesions Neurological: Negative for worsening tremors and other numbness  Psychiatric/Behavioral: Negative for worsening agitation or other fatigue All other system neg per pt    Objective:   Physical Exam BP 126/82   Pulse 98   Temp 99 F (37.2 C) (Oral)   Ht 5\' 7"  (1.702 m)   Wt 238 lb (108 kg)   SpO2 95%   BMI 37.28 kg/m  VS noted, mild ill Constitutional: Pt appears in NAD HENT: Head: NCAT.  Right Ear: External ear normal.  Left Ear: External ear normal.  Eyes: . Pupils are equal, round, and reactive to light. Conjunctivae and  EOM are normal Bilat tm's with mild erythema.  Max sinus areas mild tender.  Pharynx with mild erythema, no exudate Nose: without d/c or deformity Neck: Neck supple. Gross normal ROM Cardiovascular: Normal rate and regular rhythm.   Pulmonary/Chest: Effort normal and breath sounds without rales or wheezing.  Neurological: Pt is alert. At baseline orientation, motor grossly intact Skin: Skin is warm. No rashes, other new lesions, no LE edema Psychiatric: Pt behavior is normal without agitation  No other exam findings Lab Results  Component Value Date   WBC 5.5 11/18/2017   HGB 13.9 11/18/2017   HCT 38.7 11/18/2017   PLT 228.0 11/18/2017   GLUCOSE 133 (H) 11/18/2017   CHOL 266 (H) 01/07/2018   TRIG 225.0 (H) 01/07/2018   HDL 50.90 01/07/2018   LDLDIRECT 187.0 01/07/2018   LDLCALC 158 (H) 12/09/2016   ALT 18 11/18/2017   AST 9 11/18/2017   NA 138 11/18/2017   K 3.9 11/18/2017   CL 102 11/18/2017   CREATININE 0.78 11/18/2017   BUN 14 11/18/2017   CO2 28 11/18/2017   TSH 0.44 11/18/2017   HGBA1C 6.6 (A) 01/07/2018   MICROALBUR 5.0 (H) 10/16/2015   Rapid flu test - negative      Assessment & Plan:

## 2018-06-26 NOTE — Patient Instructions (Signed)
Your flu test was negative  Please take all new medication as prescribed - the antibiotic, and cough medicine  Please continue all other medications as before, and refills have been done if requested.  Please have the pharmacy call with any other refills you may need.  Please keep your appointments with your specialists as you may have planned   

## 2018-07-15 ENCOUNTER — Other Ambulatory Visit: Payer: Self-pay | Admitting: Internal Medicine

## 2018-07-16 ENCOUNTER — Other Ambulatory Visit: Payer: Self-pay | Admitting: Internal Medicine

## 2018-07-16 DIAGNOSIS — K581 Irritable bowel syndrome with constipation: Secondary | ICD-10-CM

## 2018-07-16 DIAGNOSIS — E1121 Type 2 diabetes mellitus with diabetic nephropathy: Secondary | ICD-10-CM

## 2018-07-16 DIAGNOSIS — E785 Hyperlipidemia, unspecified: Secondary | ICD-10-CM

## 2018-07-16 MED ORDER — LINACLOTIDE 290 MCG PO CAPS: 290.0000 ug | ORAL_CAPSULE | Freq: Every day | ORAL | 1 refills | Status: DC

## 2018-07-16 MED ORDER — HYDROCHLOROTHIAZIDE 25 MG PO TABS: 25.0000 mg | ORAL_TABLET | Freq: Every day | ORAL | 1 refills | Status: DC

## 2018-07-16 NOTE — Telephone Encounter (Signed)
Copied from [place] [phone]. Topic: Quick Communication - Rx Refill/Question >> Jul 16, 2018  3:14 PM Bea Graff, NT wrote: Medication: hydrochlorothiazide (HYDRODIURIL) 25 MG tablet, pantoprazole (PROTONIX) 40 MG tablet, linaclotide (LINZESS) 290 MCG CAPS capsule, and Pitavastatin Calcium (LIVALO) 4 MG TABS   [facility]  Has the patient contacted their pharmacy? Yes.   (Agent: If no, request that the patient contact the pharmacy for the refill.) (Agent: If yes, when and what did the pharmacy advise?)  Preferred Pharmacy (with phone number or street name): [facility], [place] - [address] [place] [phone] (Phone) [phone] (Fax)    Agent: Please be advised that RX refills may take up to 3 business days. We ask that you follow-up with your pharmacy.

## 2018-07-16 NOTE — Telephone Encounter (Signed)
Requested medication (s) are due for refill today: yes  Requested medication (s) are on the active medication list: yes  Last refill:  Protonix historical provider(12/27/16), Pitavastatin 01/07/18 (labs out of date)  Future visit scheduled: no  Notes to clinic:  Labs 2018    Requested Prescriptions  Pending Prescriptions Disp Refills   pantoprazole (PROTONIX) 40 MG tablet 60 tablet 11    Sig: Take 1 tablet (40 mg total) by mouth 2 (two) times daily.     Gastroenterology: Proton Pump Inhibitors Passed - 07/16/2018  3:30 PM      Passed - Valid encounter within last 12 months    Recent Outpatient Visits          2 weeks ago Acute upper respiratory infection   [facility] -Georges Mouse, MD   6 months ago E. coli UTI (urinary tract infection)   [place], Thomas L, MD   8 months ago [place], Marvis Repress, FNP   10 months ago Post herpetic neuralgia   [place], MD   1 year ago Auricular cyst   [place], Elizabeth A, MD            Pitavastatin Calcium (LIVALO) 4 MG TABS 90 tablet 1    Sig: Take 1 tablet (4 mg total) by mouth daily.     Cardiovascular:  Antilipid - Statins Failed - 07/16/2018  3:30 PM      Failed - Total Cholesterol in normal range and within 360 days    Cholesterol, Total  Date Value Ref Range Status  12/09/2016 261 (H) 100 - 199 mg/dL Final   Cholesterol  Date Value Ref Range Status  01/07/2018 266 (H) 0 - 200 mg/dL Final    Comment:    ATP III Classification       Desirable:  < 200 mg/dL               Borderline High:  200 - 239 mg/dL          High:  > = 240 mg/dL         Failed - LDL in normal range and within 360 days    LDL Calculated  Date Value Ref Range Status  12/09/2016 158 (H) 0 - 99 mg/dL Final         Failed - Triglycerides in normal range and within  360 days    Triglycerides  Date Value Ref Range Status  01/07/2018 225.0 (H) 0.0 - 149.0 mg/dL Final    Comment:    Normal:  <150 mg/dLBorderline High:  150 - 199 mg/dL  11/14/2010 221  Final  11/14/2010 221  Final         Passed - HDL in normal range and within 360 days    HDL  Date Value Ref Range Status  01/07/2018 50.90 >39.00 mg/dL Final  12/09/2016 55 >39 mg/dL Final         Passed - Patient is not pregnant      Passed - Valid encounter within last 12 months    Recent Outpatient Visits          2 weeks ago Acute upper respiratory infection   [place], James W, MD   6 months ago E. coli UTI (urinary tract infection)   [facility] -Mayer Camel,  MD   8 months ago [place], Marvis Repress, FNP   10 months ago Post herpetic neuralgia   [place], Claudina Lick, MD   1 year ago Auricular cyst   [place], MD           Signed Prescriptions Disp Refills   hydrochlorothiazide (HYDRODIURIL) 25 MG tablet 90 tablet 1    Sig: Take 1 tablet (25 mg total) by mouth daily.     Cardiovascular: Diuretics - Thiazide Passed - 07/16/2018  3:30 PM      Passed - Ca in normal range and within 360 days    Calcium  Date Value Ref Range Status  11/18/2017 9.6 8.4 - 10.5 mg/dL Final         Passed - Cr in normal range and within 360 days    Creat  Date Value Ref Range Status  07/26/2015 0.76 0.50 - 0.99 mg/dL Final   Creatinine, Ser  Date Value Ref Range Status  11/18/2017 0.78 0.40 - 1.20 mg/dL Final         Passed - K in normal range and within 360 days    Potassium  Date Value Ref Range Status  11/18/2017 3.9 3.5 - 5.1 mEq/L Final         Passed - Na in normal range and within 360 days    Sodium  Date Value Ref Range Status  11/18/2017 138 135 - 145 mEq/L Final  12/09/2016 139 134 -  144 mmol/L Final         Passed - Last BP in normal range    BP Readings from Last 1 Encounters:  06/26/18 126/82         Passed - Valid encounter within last 6 months    Recent Outpatient Visits          2 weeks ago Acute upper respiratory infection   [facility] -Georges Mouse, MD   6 months ago E. coli UTI (urinary tract infection)   [place], Thomas L, MD   8 months ago [place], Marvis Repress, FNP   10 months ago Post herpetic neuralgia   [place], Claudina Lick, MD   1 year ago Auricular cyst   [place], [place], MD            linaclotide (LINZESS) 290 MCG CAPS capsule 90 capsule 1    Sig: Take 1 capsule (290 mcg total) by mouth daily before breakfast.     Gastroenterology: Irritable Bowel Syndrome Passed - 07/16/2018  3:30 PM      Passed - Valid encounter within last 12 months    Recent Outpatient Visits          2 weeks ago Acute upper respiratory infection   [place], James W, MD   6 months ago E. coli UTI (urinary tract infection)   [place], Thomas L, MD   8 months ago [place], Marvis Repress, FNP   10 months ago Post herpetic neuralgia   [place], Claudina Lick, MD   1 year ago Auricular cyst   [facility] -Chuck Hint, MD

## 2018-07-22 ENCOUNTER — Telehealth: Payer: Self-pay

## 2018-07-22 NOTE — Telephone Encounter (Signed)
Key: [id number]  PA has been approved.   My chart message sent to patient informing of same.

## 2018-09-09 NOTE — Telephone Encounter (Signed)
error 

## 2018-10-06 LAB — HM DIABETES EYE EXAM

## 2018-10-08 DIAGNOSIS — H40013 Open angle with borderline findings, low risk, bilateral: Secondary | ICD-10-CM | POA: Diagnosis not present

## 2018-10-08 DIAGNOSIS — Z961 Presence of intraocular lens: Secondary | ICD-10-CM | POA: Diagnosis not present

## 2018-10-12 ENCOUNTER — Other Ambulatory Visit: Payer: Self-pay | Admitting: Internal Medicine

## 2018-10-12 ENCOUNTER — Telehealth: Payer: Self-pay

## 2018-10-12 DIAGNOSIS — K581 Irritable bowel syndrome with constipation: Secondary | ICD-10-CM

## 2018-10-12 NOTE — Telephone Encounter (Signed)
Appointment has been made for tomorrow 5/12 @130pm .

## 2018-10-12 NOTE — Telephone Encounter (Signed)
LVM for pt to call back as soon as possible.   RE: Pt is due for an appt to check a1c and labs.

## 2018-10-13 ENCOUNTER — Encounter: Payer: Self-pay | Admitting: Internal Medicine

## 2018-10-13 ENCOUNTER — Other Ambulatory Visit: Payer: Self-pay

## 2018-10-13 ENCOUNTER — Ambulatory Visit (INDEPENDENT_AMBULATORY_CARE_PROVIDER_SITE_OTHER): Payer: BLUE CROSS/BLUE SHIELD | Admitting: Internal Medicine

## 2018-10-13 ENCOUNTER — Other Ambulatory Visit (INDEPENDENT_AMBULATORY_CARE_PROVIDER_SITE_OTHER): Payer: BLUE CROSS/BLUE SHIELD

## 2018-10-13 VITALS — BP 124/80 | HR 83 | Temp 97.9°F | Ht 67.0 in | Wt 237.0 lb

## 2018-10-13 DIAGNOSIS — M51369 Other intervertebral disc degeneration, lumbar region without mention of lumbar back pain or lower extremity pain: Secondary | ICD-10-CM

## 2018-10-13 DIAGNOSIS — I10 Essential (primary) hypertension: Secondary | ICD-10-CM

## 2018-10-13 DIAGNOSIS — E559 Vitamin D deficiency, unspecified: Secondary | ICD-10-CM | POA: Diagnosis not present

## 2018-10-13 DIAGNOSIS — Z23 Encounter for immunization: Secondary | ICD-10-CM | POA: Diagnosis not present

## 2018-10-13 DIAGNOSIS — E785 Hyperlipidemia, unspecified: Secondary | ICD-10-CM

## 2018-10-13 DIAGNOSIS — Z1159 Encounter for screening for other viral diseases: Secondary | ICD-10-CM | POA: Diagnosis not present

## 2018-10-13 DIAGNOSIS — E1121 Type 2 diabetes mellitus with diabetic nephropathy: Secondary | ICD-10-CM

## 2018-10-13 DIAGNOSIS — L659 Nonscarring hair loss, unspecified: Secondary | ICD-10-CM

## 2018-10-13 DIAGNOSIS — K7581 Nonalcoholic steatohepatitis (NASH): Secondary | ICD-10-CM

## 2018-10-13 DIAGNOSIS — M5136 Other intervertebral disc degeneration, lumbar region: Secondary | ICD-10-CM

## 2018-10-13 DIAGNOSIS — K581 Irritable bowel syndrome with constipation: Secondary | ICD-10-CM

## 2018-10-13 LAB — COMPREHENSIVE METABOLIC PANEL
ALT: 17 U/L (ref 0–35)
AST: 9 U/L (ref 0–37)
Albumin: 4.3 g/dL (ref 3.5–5.2)
Alkaline Phosphatase: 65 U/L (ref 39–117)
BUN: 14 mg/dL (ref 6–23)
CO2: 31 mEq/L (ref 19–32)
Calcium: 9.3 mg/dL (ref 8.4–10.5)
Chloride: 98 mEq/L (ref 96–112)
Creatinine, Ser: 0.76 mg/dL (ref 0.40–1.20)
GFR: 92.74 mL/min (ref >60.00)
Glucose, Bld: 192 mg/dL — ABNORMAL HIGH (ref 70–99)
Potassium: 3.6 mEq/L (ref 3.5–5.1)
Sodium: 137 mEq/L (ref 135–145)
Total Bilirubin: 0.4 mg/dL (ref 0.2–1.2)
Total Protein: 7.6 g/dL (ref 6.0–8.3)

## 2018-10-13 LAB — CBC WITH DIFFERENTIAL/PLATELET
Basophils Absolute: 0 10*3/uL (ref 0.0–0.1)
Basophils Relative: 0.4 % (ref 0.0–3.0)
Eosinophils Absolute: 0.1 10*3/uL (ref 0.0–0.7)
Eosinophils Relative: 1.2 % (ref 0.0–5.0)
HCT: 37.6 % (ref 36.0–46.0)
Hemoglobin: 13.5 g/dL (ref 12.0–15.0)
Lymphocytes Relative: 53.4 % — ABNORMAL HIGH (ref 12.0–46.0)
Lymphs Abs: 2.4 10*3/uL (ref 0.7–4.0)
MCHC: 36 g/dL (ref 30.0–36.0)
MCV: 84.7 fl (ref 78.0–100.0)
Monocytes Absolute: 0.4 10*3/uL (ref 0.1–1.0)
Monocytes Relative: 8.4 % (ref 3.0–12.0)
Neutro Abs: 1.6 10*3/uL (ref 1.4–7.7)
Neutrophils Relative %: 36.6 % — ABNORMAL LOW (ref 43.0–77.0)
Platelets: 219 10*3/uL (ref 150.0–400.0)
RBC: 4.44 Mil/uL (ref 3.87–5.11)
RDW: 15 % (ref 11.5–15.5)
WBC: 4.5 10*3/uL (ref 4.0–10.5)

## 2018-10-13 LAB — URINALYSIS, ROUTINE W REFLEX MICROSCOPIC
Bilirubin Urine: NEGATIVE
Hgb urine dipstick: NEGATIVE
Ketones, ur: NEGATIVE
Nitrite: NEGATIVE
Specific Gravity, Urine: 1.025 (ref 1.000–1.030)
Total Protein, Urine: NEGATIVE
Urine Glucose: NEGATIVE
Urobilinogen, UA: 0.2 (ref 0.0–1.0)
pH: 5.5 (ref 5.0–8.0)

## 2018-10-13 LAB — LIPID PANEL
Cholesterol: 236 mg/dL — ABNORMAL HIGH (ref 0–200)
HDL: 49 mg/dL (ref >39.00)
LDL Cholesterol: 148 mg/dL — ABNORMAL HIGH (ref 0–99)
NonHDL: 186.84
Total CHOL/HDL Ratio: 5
Triglycerides: 195 mg/dL — ABNORMAL HIGH (ref 0.0–149.0)
VLDL: 39 mg/dL (ref 0.0–40.0)

## 2018-10-13 LAB — MICROALBUMIN / CREATININE URINE RATIO
Creatinine,U: 74.9 mg/dL
Microalb Creat Ratio: 2.2 mg/g (ref 0.0–30.0)
Microalb, Ur: 1.7 mg/dL (ref 0.0–1.9)

## 2018-10-13 LAB — HEMOGLOBIN A1C: Hgb A1c MFr Bld: 6.4 % (ref 4.6–6.5)

## 2018-10-13 LAB — VITAMIN D 25 HYDROXY (VIT D DEFICIENCY, FRACTURES): VITD: 39.57 ng/mL (ref 30.00–100.00)

## 2018-10-13 LAB — TSH: TSH: 0.45 u[IU]/mL (ref 0.35–4.50)

## 2018-10-13 MED ORDER — PLECANATIDE 3 MG PO TABS: 1.0000 | ORAL_TABLET | Freq: Every day | ORAL | 1 refills | Status: DC

## 2018-10-13 MED ORDER — LIDOCAINE 5 % EX PTCH: 1.0000 | MEDICATED_PATCH | CUTANEOUS | 1 refills | Status: DC

## 2018-10-13 MED ORDER — PITAVASTATIN CALCIUM 4 MG PO TABS: 1.0000 | ORAL_TABLET | Freq: Every day | ORAL | 1 refills | Status: DC

## 2018-10-13 MED ORDER — FINASTERIDE 1 MG PO TABS: 1.0000 mg | ORAL_TABLET | Freq: Every day | ORAL | 1 refills | Status: DC

## 2018-10-13 NOTE — Patient Instructions (Signed)
Type 2 Diabetes Mellitus, Diagnosis, Adult Type 2 diabetes (type 2 diabetes mellitus) is a long-term (chronic) disease. In type 2 diabetes, one or both of these problems may be present:  The pancreas does not make enough of a hormone called insulin.  Cells in the body do not respond properly to insulin that the body makes (insulin resistance). Normally, insulin allows blood sugar (glucose) to enter cells in the body. The cells use glucose for energy. Insulin resistance or lack of insulin causes excess glucose to build up in the blood instead of going into cells. As a result, high blood glucose (hyperglycemia) develops. What increases the risk? The following factors may make you more likely to develop type 2 diabetes:  Having a family member with type 2 diabetes.  Being overweight or obese.  Having an inactive (sedentary) lifestyle.  Having been diagnosed with insulin resistance.  Having a history of prediabetes, gestational diabetes, or polycystic ovary syndrome (PCOS).  Being of American-Indian, African-American, Hispanic/Latino, or Asian/Pacific Islander descent. What are the signs or symptoms? In the early stage of this condition, you may not have symptoms. Symptoms develop slowly and may include:  Increased thirst (polydipsia).  Increased hunger(polyphagia).  Increased urination (polyuria).  Increased urination during the night (nocturia).  Unexplained weight loss.  Frequent infections that keep coming back (recurring).  Fatigue.  Weakness.  Vision changes, such as blurry vision.  Cuts or bruises that are slow to heal.  Tingling or numbness in the hands or feet.  Dark patches on the skin (acanthosis nigricans). How is this diagnosed? This condition is diagnosed based on your symptoms, your medical history, a physical exam, and your blood glucose level. Your blood glucose may be checked with one or more of the following blood tests:  A fasting blood glucose (FBG)  test. You will not be allowed to eat (you will fast) for 8 hours or longer before a blood sample is taken.  A random blood glucose test. This test checks blood glucose at any time of day regardless of when you ate.  An A1c (hemoglobin A1c) blood test. This test provides information about blood glucose control over the previous 2-3 months.  An oral glucose tolerance test (OGTT). This test measures your blood glucose at two times: ? After fasting. This is your baseline blood glucose level. ? Two hours after drinking a beverage that contains glucose. You may be diagnosed with type 2 diabetes if:  Your FBG level is 126 mg/dL (7.0 mmol/L) or higher.  Your random blood glucose level is 200 mg/dL (11.1 mmol/L) or higher.  Your A1c level is 6.5% or higher.  Your OGTT result is higher than 200 mg/dL (11.1 mmol/L). These blood tests may be repeated to confirm your diagnosis. How is this treated? Your treatment may be managed by a specialist called an endocrinologist. Type 2 diabetes may be treated by following instructions from your health care provider about:  Making diet and lifestyle changes. This may include: ? Following an individualized nutrition plan that is developed by a diet and nutrition specialist (registered dietitian). ? Exercising regularly. ? Finding ways to manage stress.  Checking your blood glucose level as often as told.  Taking diabetes medicines or insulin daily. This helps to keep your blood glucose levels in the healthy range. ? If you use insulin, you may need to adjust the dosage depending on how physically active you are and what foods you eat. Your health care provider will tell you how to adjust your dosage.    Taking medicines to help prevent complications from diabetes, such as: ? Aspirin. ? Medicine to lower cholesterol. ? Medicine to control blood pressure. Your health care provider will set individualized treatment goals for you. Your goals will be based on  your age, other medical conditions you have, and how you respond to diabetes treatment. Generally, the goal of treatment is to maintain the following blood glucose levels:  Before meals (preprandial): 80-130 mg/dL (4.4-7.2 mmol/L).  After meals (postprandial): below 180 mg/dL (10 mmol/L).  A1c level: less than 7%. Follow these instructions at home: Questions to ask your health care provider  Consider asking the following questions: ? Do I need to meet with a diabetes educator? ? Where can I find a support group for people with diabetes? ? What equipment will I need to manage my diabetes at home? ? What diabetes medicines do I need, and when should I take them? ? How often do I need to check my blood glucose? ? What number can I call if I have questions? ? When is my next appointment? General instructions  Take over-the-counter and prescription medicines only as told by your health care provider.  Keep all follow-up visits as told by your health care provider. This is important.  For more information about diabetes, visit: ? American Diabetes Association (ADA): www.diabetes.org ? American Association of Diabetes Educators (AADE): www.diabeteseducator.org Contact a health care provider if:  Your blood glucose is at or above 240 mg/dL (13.3 mmol/L) for 2 days in a row.  You have been sick or have had a fever for 2 days or longer, and you are not getting better.  You have any of the following problems for more than 6 hours: ? You cannot eat or drink. ? You have nausea and vomiting. ? You have diarrhea. Get help right away if:  Your blood glucose is lower than 54 mg/dL (3.0 mmol/L).  You become confused or you have trouble thinking clearly.  You have difficulty breathing.  You have moderate or large ketone levels in your urine. Summary  Type 2 diabetes (type 2 diabetes mellitus) is a long-term (chronic) disease. In type 2 diabetes, the pancreas does not make enough of a  hormone called insulin, or cells in the body do not respond properly to insulin that the body makes (insulin resistance).  This condition is treated by making diet and lifestyle changes and taking diabetes medicines or insulin.  Your health care provider will set individualized treatment goals for you. Your goals will be based on your age, other medical conditions you have, and how you respond to diabetes treatment.  Keep all follow-up visits as told by your health care provider. This is important. This information is not intended to replace advice given to you by your health care provider. Make sure you discuss any questions you have with your health care provider. Document Released: 05/20/2005 Document Revised: 12/19/2016 Document Reviewed: 06/23/2015 Elsevier Interactive Patient Education  2019 [employer]  

## 2018-10-13 NOTE — Progress Notes (Signed)
Subjective:  Patient ID: [name], female    DOB: 09-06-1955  Age: 63 y.o. MRN: [id number]  CC: Hypertension; Diabetes; and Hyperlipidemia   HPI [name] presents for f/up - She complains of intermittent abdominal pain and constipation for many years.  She tells me the abdominal pain resolves after a bowel movement.  She has tried Linzess for the symptoms but has not gotten much symptom relief.  She also complains of female pattern balding and wants to do something about it.  She complains of chronic, nonradiating low back pain and wants a refill on lidoderm patches.  She denies paresthesias in her lower extremities.  Outpatient Medications Prior to Visit  Medication Sig Dispense Refill   hydrochlorothiazide (HYDRODIURIL) 25 MG tablet Take 1 tablet (25 mg total) by mouth daily. 90 tablet 1   pantoprazole (PROTONIX) 40 MG tablet Take 1 tablet (40 mg total) by mouth 2 (two) times daily. 60 tablet 11   pregabalin (LYRICA) 75 MG capsule TAKE ONE CAPSULE BY MOUTH EVERY NIGHT AT BEDTIME 90 capsule 1   Suvorexant (BELSOMRA) 20 MG TABS Take 1 tablet by mouth daily as needed. 30 tablet 5   doxycycline (VIBRA-TABS) 100 MG tablet Take 1 tablet (100 mg total) by mouth 2 (two) times daily. 20 tablet 0   lidocaine (LIDODERM) 5 % Place 1 patch onto the skin daily. Remove & Discard patch within 12 hours or as directed by MD. For post herpetic neuralgia 30 patch 0   linaclotide (LINZESS) 290 MCG CAPS capsule Take 1 capsule (290 mcg total) by mouth daily before breakfast. 90 capsule 1   Pitavastatin Calcium (LIVALO) 4 MG TABS Take 1 tablet (4 mg total) by mouth daily. 90 tablet 1   No facility-administered medications prior to visit.     ROS Review of Systems  Constitutional: Negative for diaphoresis, fatigue and unexpected weight change.  HENT: Negative.   Eyes: Negative for visual disturbance.  Respiratory: Negative for cough, chest tightness, shortness of breath and wheezing.     Cardiovascular: Negative for chest pain, palpitations and leg swelling.  Gastrointestinal: Positive for abdominal pain and constipation. Negative for blood in stool, diarrhea, nausea and vomiting.  Endocrine: Negative.   Genitourinary: Negative.  Negative for difficulty urinating and hematuria.  Musculoskeletal: Positive for back pain. Negative for myalgias and neck pain.  Skin: Negative.        She complains of hair loss and balding  Neurological: Negative.  Negative for dizziness, weakness and light-headedness.  Hematological: Negative for adenopathy. Does not bruise/bleed easily.    Objective:  BP 124/80 (BP Location: Left Arm, Patient Position: Sitting, Cuff Size: Large)    Pulse 83    Temp 97.9 F (36.6 C) (Oral)    Ht 5\' 7"  (1.702 m)    Wt 237 lb (107.5 kg)    SpO2 99%    BMI 37.12 kg/m   BP Readings from Last 3 Encounters:  10/13/18 124/80  06/26/18 126/82  01/07/18 116/76    Wt Readings from Last 3 Encounters:  10/13/18 237 lb (107.5 kg)  06/26/18 238 lb (108 kg)  01/07/18 237 lb (107.5 kg)    Physical Exam Constitutional:      Appearance: She is obese. She is not ill-appearing or diaphoretic.  HENT:     Nose: Nose normal. No congestion.     Mouth/Throat:     Mouth: Mucous membranes are moist.     Pharynx: No oropharyngeal exudate or posterior oropharyngeal erythema.  Eyes:  General: No scleral icterus.    Conjunctiva/sclera: Conjunctivae normal.  Neck:     Musculoskeletal: Normal range of motion and neck supple. No muscular tenderness.  Cardiovascular:     Rate and Rhythm: Normal rate and regular rhythm.     Pulses: Normal pulses.     Heart sounds: No murmur. No gallop.   Pulmonary:     Effort: Pulmonary effort is normal. No respiratory distress.     Breath sounds: No stridor. No wheezing, rhonchi or rales.  Abdominal:     General: Abdomen is protuberant. Bowel sounds are normal.     Palpations: There is no hepatomegaly, splenomegaly or mass.      Tenderness: There is no abdominal tenderness. There is no guarding.  Musculoskeletal: Normal range of motion.     Right lower leg: No edema.     Left lower leg: No edema.  Lymphadenopathy:     Cervical: No cervical adenopathy.  Skin:    General: Skin is warm and dry.     Findings: No rash.     Comments: + female pattern balding over the crown and frontal scalp  Neurological:     General: No focal deficit present.  Psychiatric:        Mood and Affect: Mood normal.        Behavior: Behavior normal.     Lab Results  Component Value Date   WBC 4.5 10/13/2018   HGB 13.5 10/13/2018   HCT 37.6 10/13/2018   PLT 219.0 10/13/2018   GLUCOSE 192 (H) 10/13/2018   CHOL 236 (H) 10/13/2018   TRIG 195.0 (H) 10/13/2018   HDL 49.00 10/13/2018   LDLDIRECT 187.0 01/07/2018   LDLCALC 148 (H) 10/13/2018   ALT 17 10/13/2018   AST 9 10/13/2018   NA 137 10/13/2018   K 3.6 10/13/2018   CL 98 10/13/2018   CREATININE 0.76 10/13/2018   BUN 14 10/13/2018   CO2 31 10/13/2018   TSH 0.45 10/13/2018   HGBA1C 6.4 10/13/2018   MICROALBUR 1.7 10/13/2018    Ct Abdomen Pelvis W Contrast  Result Date: 11/07/2016 CLINICAL DATA:  Persistent abdominal pain, epigastric pain worse at night for several months, history GERD, hyperlipidemia, irritable bowel syndrome, fatty liver, diabetes mellitus, esophagitis, hypertension, former smoker EXAM: CT ABDOMEN AND PELVIS WITH CONTRAST TECHNIQUE: Multidetector CT imaging of the abdomen and pelvis was performed using the standard protocol following bolus administration of intravenous contrast. CONTRAST:  160mL ISOVUE-300 IOPAMIDOL (ISOVUE-300) INJECTION 61% IV. Dilute oral contrast. COMPARISON:  05/11/2010 FINDINGS: Lower chest: Lung bases clear Hepatobiliary: Diffuse hepatic steatosis. Gallbladder surgically absent. No biliary dilatation or hepatic mass. Pancreas: Normal appearance Spleen: Normal appearance Adrenals/Urinary Tract: LEFT adrenal gland normal appearance. Fat  attenuation mass within RIGHT adrenal gland compatible with myelolipoma 2.2 x 2.0 x 2.2 cm image 20. Tiny LEFT renal cyst. Kidneys, ureters, and bladder otherwise normal appearance Stomach/Bowel: Normal appendix. Stomach and bowel loops normal appearance Vascular/Lymphatic: Atherosclerotic calcifications aorta and iliac arteries. Aorta normal caliber. No adenopathy. Scattered pelvic phleboliths. Reproductive: Uterus surgically absent.  Normal sized ovaries. Other: Small umbilical hernia containing fat. No free air or free fluid. No acute inflammatory process identified. Musculoskeletal: No acute osseous findings. 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 Diffuse hepatic steatosis. Aortic atherosclerosis. Small umbilical hernia containing fat No acute abnormalities. Electronically Signed   By: Lavonia Dana M.D.   On: 11/07/2016 09:45    Assessment & Plan:   Aara was seen today for hypertension, diabetes and hyperlipidemia.  Diagnoses  and all orders for this visit:  HYPERTENSION, BENIGN ESSENTIAL- Her blood pressure is well controlled.  Electrolytes and renal function are normal. -     CBC with Differential/Platelet; Future -     TSH; Future -     Urinalysis, Routine w reflex microscopic; Future  Controlled type 2 diabetes mellitus with diabetic nephropathy, without long-term current use of insulin ([place])- Her A1c is at 6.4%.  Her blood sugars are adequately well controlled. -     Microalbumin / creatinine urine ratio; Future -     Hemoglobin A1c; Future  Hyperlipidemia with target LDL less than 100- She has not achieved her LDL goal and is not compliant with the statin.  I have asked her to restart the statin for cardiovascular risk reduction. -     Lipid panel; Future -     TSH; Future -     Pitavastatin Calcium (LIVALO) 4 MG TABS; Take 1 tablet (4 mg total) by mouth daily.  NASH (nonalcoholic steatohepatitis)- Her LFTs are normal now.  She will continue to work on her lifestyle  modifications. -     Comprehensive metabolic panel; Future  Need for hepatitis C screening test -     Hepatitis C antibody; Future  Vitamin D deficiency disease -     VITAMIN D 25 Hydroxy (Vit-D Deficiency, Fractures); Future  Irritable bowel syndrome with constipation -     Plecanatide (TRULANCE) 3 MG TABS; Take 1 tablet by mouth daily.  Balding -     finasteride (PROPECIA) 1 MG tablet; Take 1 tablet (1 mg total) by mouth daily.  DDD (degenerative disc disease), lumbar -     lidocaine (LIDODERM) 5 %; Place 1 patch onto the skin daily. Remove & Discard patch within 12 hours or as directed by MD.  Need for pneumococcal vaccination -     Pneumococcal polysaccharide vaccine 23-valent greater than or equal to 2yo subcutaneous/IM   I have discontinued Galen Daft. Matson's doxycycline and linaclotide. I have also changed her lidocaine. Additionally, I am having her start on finasteride and Plecanatide. Lastly, I am having her maintain her pantoprazole, Suvorexant, pregabalin, hydrochlorothiazide, and Pitavastatin Calcium.  Meds ordered this encounter  Medications   finasteride (PROPECIA) 1 MG tablet    Sig: Take 1 tablet (1 mg total) by mouth daily.    Dispense:  90 tablet    Refill:  1   lidocaine (LIDODERM) 5 %    Sig: Place 1 patch onto the skin daily. Remove & Discard patch within 12 hours or as directed by MD.    Dispense:  90 patch    Refill:  1   Plecanatide (TRULANCE) 3 MG TABS    Sig: Take 1 tablet by mouth daily.    Dispense:  90 tablet    Refill:  1   Pitavastatin Calcium (LIVALO) 4 MG TABS    Sig: Take 1 tablet (4 mg total) by mouth daily.    Dispense:  90 tablet    Refill:  1     Follow-up: Return in about 4 months (around 02/13/2019).  Scarlette Calico, MD

## 2018-10-14 LAB — HEPATITIS C ANTIBODY
Hepatitis C Ab: NONREACTIVE
SIGNAL TO CUT-OFF: 0.02 (ref <1.00)

## 2018-12-02 DIAGNOSIS — Z1231 Encounter for screening mammogram for malignant neoplasm of breast: Secondary | ICD-10-CM | POA: Diagnosis not present

## 2018-12-02 DIAGNOSIS — Z6836 Body mass index (BMI) 36.0-36.9, adult: Secondary | ICD-10-CM | POA: Diagnosis not present

## 2018-12-02 DIAGNOSIS — Z01419 Encounter for gynecological examination (general) (routine) without abnormal findings: Secondary | ICD-10-CM | POA: Diagnosis not present

## 2018-12-02 LAB — HM MAMMOGRAPHY

## 2019-02-09 ENCOUNTER — Other Ambulatory Visit: Payer: Self-pay | Admitting: Internal Medicine

## 2019-02-09 DIAGNOSIS — B0229 Other postherpetic nervous system involvement: Secondary | ICD-10-CM

## 2019-02-09 MED ORDER — PREGABALIN 75 MG PO CAPS: 75.0000 mg | ORAL_CAPSULE | Freq: Every day | ORAL | 1 refills | Status: DC

## 2019-02-16 ENCOUNTER — Encounter: Payer: Self-pay | Admitting: Internal Medicine

## 2019-04-01 ENCOUNTER — Ambulatory Visit (INDEPENDENT_AMBULATORY_CARE_PROVIDER_SITE_OTHER): Payer: BC Managed Care – PPO | Admitting: Internal Medicine

## 2019-04-01 ENCOUNTER — Telehealth: Payer: Self-pay | Admitting: Internal Medicine

## 2019-04-01 ENCOUNTER — Other Ambulatory Visit: Payer: Self-pay

## 2019-04-01 ENCOUNTER — Encounter: Payer: Self-pay | Admitting: Internal Medicine

## 2019-04-01 ENCOUNTER — Other Ambulatory Visit (INDEPENDENT_AMBULATORY_CARE_PROVIDER_SITE_OTHER): Payer: BC Managed Care – PPO

## 2019-04-01 DIAGNOSIS — R5383 Other fatigue: Secondary | ICD-10-CM

## 2019-04-01 DIAGNOSIS — M7989 Other specified soft tissue disorders: Secondary | ICD-10-CM | POA: Diagnosis not present

## 2019-04-01 NOTE — Telephone Encounter (Signed)
Copied from [place] [id number]. Topic: Appointment Scheduling - Transfer of Care >> Apr 01, 2019  8:52 AM [name] A wrote: Pt is requesting to transfer FROM: [name] Pt is requesting to transfer TO: [name]  Reason for requested transfer: Pt moved to [place].   Send CRM to patient's current PCP (transferring FROM).

## 2019-04-01 NOTE — Progress Notes (Signed)
Virtual Visit via Video Note  I connected with [name] on 04/01/19 at  2:00 PM EDT by a video enabled telemedicine application and verified that I am speaking with the correct person using two identifiers.  The patient and the provider were at separate locations throughout the entire encounter.   I discussed the limitations of evaluation and management by telemedicine and the availability of in person appointments. The patient expressed understanding and agreed to proceed. The patient and the provider were the only parties present for the visit unless noted in HPI below.  History of Present Illness: The patient is a 63 y.o. female with visit for concerns about feeling short of breath and fatigued. She has had this for a long time but feels it is worsening lately. Has problems with lying flat at night. Denies fevers or chills or nausea or vomiting or cough. Overall it is stable. Has tried nothing  Observations/Objective: Appearance: normal, breathing appears normal no coughing during visit, casual grooming, abdomen does not appear distended, throat normal, memory normal, mental status is A and O times 3  Assessment and Plan: See problem oriented charting  Follow Up Instructions: check labs  I discussed the assessment and treatment plan with the patient. The patient was provided an opportunity to ask questions and all were answered. The patient agreed with the plan and demonstrated an understanding of the instructions.   The patient was advised to call back or seek an in-person evaluation if the symptoms worsen or if the condition fails to improve as anticipated.  Hoyt Koch, MD

## 2019-04-01 NOTE — Telephone Encounter (Signed)
OK with me.  Sherri, can you please contact pt to schedule a transfer of care visit with me?

## 2019-04-01 NOTE — Telephone Encounter (Signed)
20 please.

## 2019-04-01 NOTE — Telephone Encounter (Signed)
Yes. I am ok with this.  TJ

## 2019-04-02 ENCOUNTER — Other Ambulatory Visit (INDEPENDENT_AMBULATORY_CARE_PROVIDER_SITE_OTHER): Payer: BC Managed Care – PPO

## 2019-04-02 DIAGNOSIS — M7989 Other specified soft tissue disorders: Secondary | ICD-10-CM | POA: Diagnosis not present

## 2019-04-02 DIAGNOSIS — R5383 Other fatigue: Secondary | ICD-10-CM | POA: Insufficient documentation

## 2019-04-02 LAB — COMPREHENSIVE METABOLIC PANEL
ALT: 20 U/L (ref 0–35)
AST: 12 U/L (ref 0–37)
Albumin: 4.1 g/dL (ref 3.5–5.2)
Alkaline Phosphatase: 70 U/L (ref 39–117)
BUN: 15 mg/dL (ref 6–23)
CO2: 30 mEq/L (ref 19–32)
Calcium: 9.1 mg/dL (ref 8.4–10.5)
Chloride: 103 mEq/L (ref 96–112)
Creatinine, Ser: 0.68 mg/dL (ref 0.40–1.20)
GFR: 105.29 mL/min (ref >60.00)
Glucose, Bld: 106 mg/dL — ABNORMAL HIGH (ref 70–99)
Potassium: 4.2 mEq/L (ref 3.5–5.1)
Sodium: 139 mEq/L (ref 135–145)
Total Bilirubin: 0.3 mg/dL (ref 0.2–1.2)
Total Protein: 6.9 g/dL (ref 6.0–8.3)

## 2019-04-02 LAB — CBC
HCT: 38.4 % (ref 36.0–46.0)
Hemoglobin: 13.3 g/dL (ref 12.0–15.0)
MCHC: 34.6 g/dL (ref 30.0–36.0)
MCV: 87.8 fl (ref 78.0–100.0)
Platelets: 227 10*3/uL (ref 150.0–400.0)
RBC: 4.38 Mil/uL (ref 3.87–5.11)
RDW: 14.7 % (ref 11.5–15.5)
WBC: 4.8 10*3/uL (ref 4.0–10.5)

## 2019-04-02 LAB — BRAIN NATRIURETIC PEPTIDE: Brain Natriuretic Peptide: 6 pg/mL (ref <100)

## 2019-04-02 NOTE — Assessment & Plan Note (Signed)
It is unclear that this is a new process. Checking BNP, CBC, CMP to rule out several etiologies and recommend follow up with PCP.

## 2019-04-14 ENCOUNTER — Ambulatory Visit (INDEPENDENT_AMBULATORY_CARE_PROVIDER_SITE_OTHER): Payer: BC Managed Care – PPO | Admitting: Family

## 2019-04-14 ENCOUNTER — Encounter: Payer: Self-pay | Admitting: Family

## 2019-04-14 ENCOUNTER — Other Ambulatory Visit: Payer: Self-pay

## 2019-04-14 VITALS — Wt 241.0 lb

## 2019-04-14 DIAGNOSIS — I10 Essential (primary) hypertension: Secondary | ICD-10-CM

## 2019-04-14 DIAGNOSIS — E1121 Type 2 diabetes mellitus with diabetic nephropathy: Secondary | ICD-10-CM | POA: Diagnosis not present

## 2019-04-14 DIAGNOSIS — E785 Hyperlipidemia, unspecified: Secondary | ICD-10-CM | POA: Diagnosis not present

## 2019-04-14 DIAGNOSIS — K219 Gastro-esophageal reflux disease without esophagitis: Secondary | ICD-10-CM | POA: Diagnosis not present

## 2019-04-14 MED ORDER — GABAPENTIN 100 MG PO CAPS: 100.0000 mg | ORAL_CAPSULE | Freq: Three times a day (TID) | ORAL | 1 refills | Status: DC

## 2019-04-14 MED ORDER — HYDROCHLOROTHIAZIDE 25 MG PO TABS: 25.0000 mg | ORAL_TABLET | Freq: Every day | ORAL | 1 refills | Status: DC

## 2019-04-14 MED ORDER — LINACLOTIDE 290 MCG PO CAPS: 290.0000 ug | ORAL_CAPSULE | Freq: Every day | ORAL | 1 refills | Status: DC

## 2019-04-14 NOTE — Progress Notes (Signed)
Virtual Visit via Video Note  I connected with CAMBER DRINNEN on 04/14/19 at  9:20 AM EST by a video enabled telemedicine application and verified that I am speaking with the correct person using two identifiers.  Location: Patient: home Provider: home   I discussed the limitations of evaluation and management by telemedicine and the availability of in person appointments. The patient expressed understanding and agreed to proceed.  History of Present Illness:  Patient is a 63 year old female who presents today in transfer of care from another at our [place] location.  Hyperlipidemia- maintained on livalo.  Admits that she was not taking it regularly prior to the last blood draw.  Lab Results  Component Value Date   CHOL 236 (H) 10/13/2018   HDL 49.00 10/13/2018   LDLCALC 148 (H) 10/13/2018   LDLDIRECT 187.0 01/07/2018   TRIG 195.0 (H) 10/13/2018   CHOLHDL 5 10/13/2018   Reports that she has pain in legs/feet at night.  Reports pain improved when she gets up and moving.  Reports that she gets up 3 times a night to urinate.  She continues lyrica.  Does not feel that the lyrica is helping her pain.  She often finds herself sleepy in the morning  GERD- maintained on protonix.  Reports symptoms are well controlled.  She would like to transfer to a gastroenterologist due to her recent relocation.  DM2-this is diet controlled. Lab Results  Component Value Date   HGBA1C 6.4 10/13/2018   HGBA1C 6.6 (A) 01/07/2018   HGBA1C 6.5 11/18/2017   Lab Results  Component Value Date   MICROALBUR 1.7 10/13/2018   LDLCALC 148 (H) 10/13/2018   CREATININE 0.68 04/02/2019   IBS-C- reports that she has to take senakot every day.  She is also maintained on Linzess.  Past Medical History:  Diagnosis Date  . Arthritis   . Back pain   . Benign fundic gland polyps of stomach   . Constipation   . Diabetes mellitus without complication ([place])   . Esophagitis    LA Class B  . Fatigue   . Fatty liver    . Fatty liver   . Fibromyalgia   . Gallbladder problem   . GERD (gastroesophageal reflux disease)   . Heart murmur   . Hyperlipidemia   . Hypertension   . IBS (irritable bowel syndrome)   . Joint pain   . Neuromuscular disorder ([place])   . Prediabetes      Social History   Socioeconomic History  . Marital status: Married    Spouse name: Not on file  . Number of children: Not on file  . Years of education: Not on file  . Highest education level: Not on file  Occupational History  . 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 Financial resource strain: Not on file  . Food insecurity    Worry: Not on file    Inability: Not on file  . Transportation needs    Medical: Not on file    Non-medical: Not on file  Tobacco Use  . Smoking status: Former Smoker    Packs/day: 0.10    Years: 10.00    Pack years: 1.00    Types: Cigarettes    Quit date: 07/14/1981    Years since quitting: 37.7  . Smokeless tobacco: Never Used  Substance and Sexual Activity  . Alcohol use: No  . Drug use: No  . Sexual activity: Not Currently  Lifestyle  . Physical  activity    Days per week: Not on file    Minutes per session: Not on file  . Stress: Not on file  Relationships  . Social Herbalist on phone: Not on file    Gets together: Not on file    Attends religious service: Not on file    Active member of club or organization: Not on file    Attends meetings of clubs or organizations: Not on file    Relationship status: Not on file  . Intimate partner violence    Fear of current or ex partner: Not on file    Emotionally abused: Not on file    Physically abused: Not on file    Forced sexual activity: Not on file  Other Topics Concern  . 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 CHOLECYSTECTOMY  2009   low GB EF, no gallstones  . COLONOSCOPY    . LAPAROSCOPY     of adhesions/ of ovaries  . UPPER GASTROINTESTINAL ENDOSCOPY    . VAGINAL HYSTERECTOMY      Family History  Problem Relation Age of Onset  . Colon cancer Father   . Hypertension Father   . Hyperlipidemia Father   . Obesity Father   . Heart disease Mother   . Hypertension Mother   . Hyperlipidemia Mother   . Obesity Mother   . Diabetes Brother        Half  . Heart disease Brother   . Diabetes Sister   . Breast cancer Paternal Grandmother   . Esophageal cancer Neg Hx   . Stomach cancer Neg Hx   . Rectal cancer Neg Hx     Allergies  Allergen Reactions  . Crestor [Rosuvastatin Calcium]     Muscle aches    Current Outpatient Medications on File Prior to Visit  Medication Sig Dispense Refill  . lidocaine (LIDODERM) 5 % Place 1 patch onto the skin daily. Remove & Discard patch within 12 hours or as directed by MD. 90 patch 1  . pantoprazole (PROTONIX) 40 MG tablet Take 1 tablet (40 mg total) by mouth 2 (two) times daily. 60 tablet 11  . Pitavastatin Calcium (LIVALO) 4 MG TABS Take 1 tablet (4 mg total) by mouth daily. 90 tablet 1   No current facility-administered medications on file prior to visit.     Wt 241 lb (109.3 kg)   BMI 37.75 kg/m       Observations/Objective:   Gen: Awake, alert, no acute distress Resp: Breathing is even and non-labored Psych: calm/pleasant demeanor Neuro: Alert and Oriented x 3, + facial symmetry, speech is clear.   Assessment and Plan:  Diabetes type 2-  We discussed diabetic diet, exercise, and weight loss.    GERD-this is stable on proton pump inhibitor.  Peripheral neuropathy-will transition from Lyrica to gabapentin 100 mg 3 times to see if this helps better and reduces her a.m.  hyperlipidemia.  We will see how she responds to this.  We may also need to further evaluate her chart.  Cymbalta could be a consideration as well.  IBS-fair control on Linzess.  We discussed importance of high-fiber diet and  Hyperlipidemia-continue Livalo.  Plan to check lipid panel at her upcoming physical.  We will also plan follow-up A1c at that time.  Hypertension- maintained on hydrochlorothiazide.  Plan to recheck blood pressure at her upcoming physical. BP Readings from Last 3 Encounters:  10/13/18 124/80  06/26/18 126/82  01/07/18 116/76    Follow Up Instructions:    I discussed the assessment and treatment plan with the patient. The patient was provided an opportunity to ask questions and all were answered. The patient agreed with the plan and demonstrated an understanding of the instructions.   The patient was advised to call back or seek an in-person evaluation if the symptoms worsen or if the condition fails to improve as anticipated.  Nance Pear, NP

## 2019-05-04 DIAGNOSIS — H52203 Unspecified astigmatism, bilateral: Secondary | ICD-10-CM | POA: Diagnosis not present

## 2019-05-04 DIAGNOSIS — H47393 Other disorders of optic disc, bilateral: Secondary | ICD-10-CM | POA: Diagnosis not present

## 2019-05-04 DIAGNOSIS — H26493 Other secondary cataract, bilateral: Secondary | ICD-10-CM | POA: Diagnosis not present

## 2019-05-04 DIAGNOSIS — E119 Type 2 diabetes mellitus without complications: Secondary | ICD-10-CM | POA: Diagnosis not present

## 2019-05-04 DIAGNOSIS — H524 Presbyopia: Secondary | ICD-10-CM | POA: Diagnosis not present

## 2019-05-04 DIAGNOSIS — H5203 Hypermetropia, bilateral: Secondary | ICD-10-CM | POA: Diagnosis not present

## 2019-05-04 DIAGNOSIS — H40003 Preglaucoma, unspecified, bilateral: Secondary | ICD-10-CM | POA: Diagnosis not present

## 2019-05-04 DIAGNOSIS — H02831 Dermatochalasis of right upper eyelid: Secondary | ICD-10-CM | POA: Insufficient documentation

## 2019-05-04 LAB — HM DIABETES EYE EXAM

## 2019-05-24 ENCOUNTER — Other Ambulatory Visit: Payer: Self-pay

## 2019-05-25 ENCOUNTER — Encounter: Payer: Self-pay | Admitting: Family

## 2019-05-25 ENCOUNTER — Ambulatory Visit (INDEPENDENT_AMBULATORY_CARE_PROVIDER_SITE_OTHER): Payer: BC Managed Care – PPO | Admitting: Family

## 2019-05-25 ENCOUNTER — Other Ambulatory Visit: Payer: Self-pay

## 2019-05-25 VITALS — BP 140/74 | HR 72 | Temp 96.5°F | Resp 18 | Ht 67.0 in | Wt 243.0 lb

## 2019-05-25 DIAGNOSIS — I10 Essential (primary) hypertension: Secondary | ICD-10-CM

## 2019-05-25 DIAGNOSIS — Z23 Encounter for immunization: Secondary | ICD-10-CM | POA: Diagnosis not present

## 2019-05-25 DIAGNOSIS — Z0000 Encounter for general adult medical examination without abnormal findings: Secondary | ICD-10-CM | POA: Diagnosis not present

## 2019-05-25 DIAGNOSIS — E785 Hyperlipidemia, unspecified: Secondary | ICD-10-CM

## 2019-05-25 DIAGNOSIS — G8929 Other chronic pain: Secondary | ICD-10-CM

## 2019-05-25 DIAGNOSIS — J329 Chronic sinusitis, unspecified: Secondary | ICD-10-CM

## 2019-05-25 DIAGNOSIS — M25561 Pain in right knee: Secondary | ICD-10-CM | POA: Diagnosis not present

## 2019-05-25 DIAGNOSIS — E348 Other specified endocrine disorders: Secondary | ICD-10-CM

## 2019-05-25 DIAGNOSIS — M549 Dorsalgia, unspecified: Secondary | ICD-10-CM

## 2019-05-25 DIAGNOSIS — E1121 Type 2 diabetes mellitus with diabetic nephropathy: Secondary | ICD-10-CM | POA: Diagnosis not present

## 2019-05-25 LAB — CBC WITH DIFFERENTIAL/PLATELET
Basophils Absolute: 0 10*3/uL (ref 0.0–0.1)
Basophils Relative: 0.5 % (ref 0.0–3.0)
Eosinophils Absolute: 0.1 10*3/uL (ref 0.0–0.7)
Eosinophils Relative: 1.4 % (ref 0.0–5.0)
HCT: 39.2 % (ref 36.0–46.0)
Hemoglobin: 13.7 g/dL (ref 12.0–15.0)
Lymphocytes Relative: 51 % — ABNORMAL HIGH (ref 12.0–46.0)
Lymphs Abs: 2.4 10*3/uL (ref 0.7–4.0)
MCHC: 34.9 g/dL (ref 30.0–36.0)
MCV: 87.3 fl (ref 78.0–100.0)
Monocytes Absolute: 0.5 10*3/uL (ref 0.1–1.0)
Monocytes Relative: 10.4 % (ref 3.0–12.0)
Neutro Abs: 1.7 10*3/uL (ref 1.4–7.7)
Neutrophils Relative %: 36.7 % — ABNORMAL LOW (ref 43.0–77.0)
Platelets: 217 10*3/uL (ref 150.0–400.0)
RBC: 4.5 Mil/uL (ref 3.87–5.11)
RDW: 14.9 % (ref 11.5–15.5)
WBC: 4.7 10*3/uL (ref 4.0–10.5)

## 2019-05-25 LAB — MICROALBUMIN / CREATININE URINE RATIO
Creatinine,U: 59.7 mg/dL
Microalb Creat Ratio: 2.4 mg/g (ref 0.0–30.0)
Microalb, Ur: 1.4 mg/dL (ref 0.0–1.9)

## 2019-05-25 LAB — LIPID PANEL
Cholesterol: 265 mg/dL — ABNORMAL HIGH (ref 0–200)
HDL: 49 mg/dL (ref >39.00)
NonHDL: 216.41
Total CHOL/HDL Ratio: 5
Triglycerides: 211 mg/dL — ABNORMAL HIGH (ref 0.0–149.0)
VLDL: 42.2 mg/dL — ABNORMAL HIGH (ref 0.0–40.0)

## 2019-05-25 LAB — LDL CHOLESTEROL, DIRECT: Direct LDL: 174 mg/dL

## 2019-05-25 LAB — COMPREHENSIVE METABOLIC PANEL
ALT: 21 U/L (ref 0–35)
AST: 11 U/L (ref 0–37)
Albumin: 4.3 g/dL (ref 3.5–5.2)
Alkaline Phosphatase: 64 U/L (ref 39–117)
BUN: 11 mg/dL (ref 6–23)
CO2: 30 mEq/L (ref 19–32)
Calcium: 9.8 mg/dL (ref 8.4–10.5)
Chloride: 100 mEq/L (ref 96–112)
Creatinine, Ser: 0.65 mg/dL (ref 0.40–1.20)
GFR: 110.87 mL/min (ref >60.00)
Glucose, Bld: 98 mg/dL (ref 70–99)
Potassium: 3.8 mEq/L (ref 3.5–5.1)
Sodium: 137 mEq/L (ref 135–145)
Total Bilirubin: 0.6 mg/dL (ref 0.2–1.2)
Total Protein: 7.5 g/dL (ref 6.0–8.3)

## 2019-05-25 LAB — HEMOGLOBIN A1C: Hgb A1c MFr Bld: 6.2 % (ref 4.6–6.5)

## 2019-05-25 LAB — TSH: TSH: 0.6 u[IU]/mL (ref 0.35–4.50)

## 2019-05-25 MED ORDER — AMOXICILLIN-POT CLAVULANATE 875-125 MG PO TABS: 1.0000 | ORAL_TABLET | Freq: Two times a day (BID) | ORAL | 0 refills | Status: DC

## 2019-05-25 MED ORDER — MELOXICAM 7.5 MG PO TABS: 7.5000 mg | ORAL_TABLET | Freq: Every day | ORAL | 1 refills | Status: DC

## 2019-05-25 NOTE — Patient Instructions (Signed)
Please complete lab work prior to leaving.   

## 2019-05-25 NOTE — Progress Notes (Signed)
   Subjective:    Patient ID: [name], female    DOB: Apr 16, 1956, 63 y.o.   MRN: [id number]  HPI  Patient presents today for complete physical.  Immunizations: tetanus up to date, pneumovax up to date  Due for shingrix. Flu shot up to date Diet: healthy Exercise: yes, but limited due to knee pain Colonoscopy: 01/14/17 Dexa: due  Pap Smear: hysterectomy Mammogram: 12/02/18 Vision: up to date Dental: up to date  Peripheral neuropathy- last visit we d/c'd lyrica and transitioned to gabapentin. She reports that gapapentin does not help her pain. Ibuprofen helps better than the gabapentin.    Reports right knee pain- swelling. She as seen Dr. Maxie Better in [facility] for knee and back pain.   Hyperlidemia- maintained on livalo.   Patient reports that she has had nasal congestion on and off.  Bought a humidifier.  Reports nasal congestion is clear. She denies fever.  She reports some tooth pain and cheek pain.    Reports post prandial pain.  She has appointment with Dr stark on 06/28/18  Review of Systems  Constitutional: Negative for unexpected weight change.  HENT: Positive for rhinorrhea. Negative for hearing loss.   Eyes: Negative for visual disturbance.  Respiratory: Negative for cough.   Cardiovascular: Negative for chest pain.  Gastrointestinal: Positive for constipation.  Genitourinary: Positive for frequency (normal amount ). Negative for dysuria.  Musculoskeletal: Positive for arthralgias (right knee) and back pain.  Skin: Negative for rash.  Neurological: Negative for headaches.  Hematological: Negative for adenopathy.  Psychiatric/Behavioral:       Denies depression/anxiety       Objective:   Physical Exam  Physical Exam  Constitutional: She is oriented to person, place, and time. She appears well-developed and well-nourished. No distress.  HENT:  Head: Normocephalic and atraumatic.  Ears: bilateral cerumen impaction  Mouth/Throat: Not examined- wearing mask Eyes:  Pupils are equal, round, and reactive to light. No scleral icterus.  Neck: Normal range of motion. No thyromegaly present.  Cardiovascular: Normal rate and regular rhythm.   No murmur heard. Pulmonary/Chest: Effort normal and breath sounds normal. No respiratory distress. He has no wheezes. She has no rales. She exhibits no tenderness.  Abdominal: Soft. Bowel sounds are normal. She exhibits no distension and no mass. There is no tenderness. There is no rebound and no guarding.  Musculoskeletal: She exhibits no edema.  Lymphadenopathy:    She has no cervical adenopathy.  Neurological: She is alert and oriented to person, place, and time. She exhibits normal muscle tone. Coordination normal.  Skin: Skin is warm and dry.  Psychiatric: She has a normal mood and affect. Her behavior is normal. Judgment and thought content normal.  Breast/pelvic: deferred          Assessment & Plan:    Preventative care- shingrix #1 today, refer for dexa scan.  Discussed diet, exercise, weight loss. Colo and mammo up to date. Flu shot and tetanus up to date.  Cerumen impaction- ears flushed by CMA- pt tolerated procedure   Knee pain- refer to ortho- rx with meloxicam prn.     Assessment & Plan:    HTN- BP is acceptable. Continue hydrochlorothiazide.  BP Readings from Last 3 Encounters:  05/25/19 140/74  10/13/18 124/80  06/26/18 126/82   DM2- obtain follow up A1c.   Hyperlipidemia- obtain follow up lipid panel.  Continue statin.   Sinusitis- new. Will rx with augmentin.

## 2019-05-30 ENCOUNTER — Encounter: Payer: Self-pay | Admitting: Family

## 2019-05-31 DIAGNOSIS — M419 Scoliosis, unspecified: Secondary | ICD-10-CM | POA: Diagnosis not present

## 2019-05-31 DIAGNOSIS — M25561 Pain in right knee: Secondary | ICD-10-CM | POA: Diagnosis not present

## 2019-05-31 DIAGNOSIS — M5136 Other intervertebral disc degeneration, lumbar region: Secondary | ICD-10-CM | POA: Diagnosis not present

## 2019-05-31 DIAGNOSIS — M545 Low back pain: Secondary | ICD-10-CM | POA: Diagnosis not present

## 2019-06-29 ENCOUNTER — Ambulatory Visit (INDEPENDENT_AMBULATORY_CARE_PROVIDER_SITE_OTHER): Payer: BC Managed Care – PPO | Admitting: Gastroenterology

## 2019-06-29 ENCOUNTER — Encounter: Payer: Self-pay | Admitting: Gastroenterology

## 2019-06-29 VITALS — BP 124/84 | HR 72 | Temp 97.3°F | Ht 67.0 in | Wt 243.2 lb

## 2019-06-29 DIAGNOSIS — K5904 Chronic idiopathic constipation: Secondary | ICD-10-CM

## 2019-06-29 DIAGNOSIS — K2100 Gastro-esophageal reflux disease with esophagitis, without bleeding: Secondary | ICD-10-CM

## 2019-06-29 DIAGNOSIS — Z800 Family history of malignant neoplasm of digestive organs: Secondary | ICD-10-CM | POA: Diagnosis not present

## 2019-06-29 MED ORDER — MOTEGRITY 2 MG PO TABS: 1.0000 | ORAL_TABLET | Freq: Every day | ORAL | 11 refills | Status: DC

## 2019-06-29 MED ORDER — PANTOPRAZOLE SODIUM 40 MG PO TBEC: 40.0000 mg | DELAYED_RELEASE_TABLET | Freq: Two times a day (BID) | ORAL | 11 refills | Status: DC

## 2019-06-29 NOTE — Patient Instructions (Signed)
We have sent the following medications to your pharmacy for you to pick up at your convenience: pantoprazole and motegrity.   Increase your senakot and milk of magnesia while waiting to get the [facility] prescription.   Thank you for choosing me and [facility].  Pricilla Riffle. Dagoberto Ligas., MD., Marval Regal

## 2019-06-29 NOTE — Progress Notes (Signed)
    History of Present Illness: This is a 64 year old female complaining of dysphagia, lower abdominal pain and constipation.  She has a history of GERD with erosive esophagitis and since running out of pantoprazole her heartburn symptoms have returned.  While on pantoprazole her heartburn was controlled.  For the past several months she has had frequent problems with solid food dysphagia, specifically meat and bread, with symptoms occurring about once per week.  She has ongoing difficulties with constipation associated with lower abdominal pain. Her lower abdominal pain is relieved when she has a complete bowel movement.  She has tried Trulance, Actuary and now Linzess without success.  Her current regimen is alternating milk of magnesia and Senokot which is partially effective.  EGD July 2018 - LA Grade B reflux esophagitis. - Multiple gastric polyps. Biopsied. (fundic gland polyps) - Normal duodenal bulb and second portion of the duodenum.  Current Medications, Allergies, Past Medical History, Past Surgical History, Family History and Social History were reviewed in [employer] record.   Physical Exam: General: Well developed, well nourished, no acute distress Head: Normocephalic and atraumatic Eyes:  sclerae anicteric, EOMI Ears: Normal auditory acuity Mouth: No deformity or lesions Lungs: Clear throughout to auscultation Heart: Regular rate and rhythm; no murmurs, rubs or bruits Abdomen: Soft, non tender and non distended. No masses, hepatosplenomegaly or hernias noted. Normal Bowel sounds Rectal: Not done Musculoskeletal: Symmetrical with no gross deformities  Pulses:  Normal pulses noted Extremities: No clubbing, cyanosis, edema or deformities noted Neurological: Alert oriented x 4, grossly nonfocal Psychological:  Alert and cooperative. Normal mood and affect   Assessment and Recommendations:  1. GERD with history of Grade B esophagitis and solid food  dysphagia.  Resume pantoprazole 40 mg p.o. twice daily.  Follow antireflux measures.  Recommended proceeding with EGD and dilation however she states she would like to wait on endoscopy and see if her symptoms improve with resuming pantoprazole.  Have advised her to call if her symptoms persist or worsen.  REV in 6 weeks.   2.  Chronic idiopathic constipation.  Trial of prucalopride 2 mg daily.  If not effective resume milk of magnesia and Senokot regimen at higher, schedule dosing.  REV in 6 weeks.  3. Family history of colon cancer.  A 5-year interval screening colonoscopy is recommended in July 2023.

## 2019-10-04 ENCOUNTER — Other Ambulatory Visit: Payer: Self-pay

## 2019-10-04 ENCOUNTER — Telehealth (INDEPENDENT_AMBULATORY_CARE_PROVIDER_SITE_OTHER): Payer: BC Managed Care – PPO | Admitting: Family

## 2019-10-04 DIAGNOSIS — J019 Acute sinusitis, unspecified: Secondary | ICD-10-CM

## 2019-10-04 MED ORDER — AMOXICILLIN 500 MG PO CAPS: 500.0000 mg | ORAL_CAPSULE | Freq: Three times a day (TID) | ORAL | 0 refills | Status: DC

## 2019-10-04 NOTE — Progress Notes (Signed)
Virtual Visit via Video Note  I connected with [name] on 10/04/19 at 12:20 PM EDT by a video enabled telemedicine application and verified that I am speaking with the correct person using two identifiers.  Location: Patient: home Provider: work   I discussed the limitations of evaluation and management by telemedicine and the availability of in person appointments. The patient expressed understanding and agreed to proceed.  History of Present Illness:  Patient is a 64 yr old female who reports + swelling on forehead and behind the right eye.  Has had sinue drainage from allergies for several days. Using otc allergy med. She denies fever.  Reports right side cheek tenderness. No ear pain.    Past Medical History:  Diagnosis Date  . Arthritis   . Back pain   . Benign fundic gland polyps of stomach   . Constipation   . Diabetes mellitus without complication ([place])   . Esophagitis    LA Class B  . Fatigue   . Fatty liver   . Fatty liver   . Fibromyalgia   . Gallbladder problem   . GERD (gastroesophageal reflux disease)   . Heart murmur   . Hyperlipidemia   . Hypertension   . IBS (irritable bowel syndrome)   . Joint pain   . Neuromuscular disorder ([place])   . Prediabetes      Social History   Socioeconomic History  . Marital status: Married    Spouse name: Not on file  . Number of children: Not on file  . Years of education: Not on file  . Highest education level: Not on file  Occupational History  . 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 Smoking status: Former Smoker    Packs/day: 0.10    Years: 10.00    Pack years: 1.00    Types: Cigarettes    Quit date: 07/14/1981    Years since quitting: 38.2  . Smokeless tobacco: Never Used  Substance and Sexual Activity  . Alcohol use: No  . Drug use: No  . Sexual activity: Not Currently  Other Topics Concern  . 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 Difficulty of Paying Living Expenses:   Food Insecurity:   . Worried About Charity fundraiser in the Last Year:   . Arboriculturist in the Last Year:   Transportation Needs:   . Film/video editor (Medical):   Marland Kitchen Lack of Transportation (Non-Medical):   Physical Activity:   . Days of Exercise per Week:   . Minutes of Exercise per Session:   Stress:   . Feeling of Stress :   Social Connections:   . Frequency of Communication with Friends and Family:   . Frequency of Social Gatherings with Friends and Family:   . Attends Religious Services:   . Active Member of Clubs or Organizations:   . Attends Archivist Meetings:   Marland Kitchen Marital Status:   Intimate Partner Violence:   . Fear of Current or Ex-Partner:   . Emotionally Abused:   Marland Kitchen Physically Abused:   .  Sexually Abused:     Past Surgical History:  Procedure Laterality Date  . CHOLECYSTECTOMY  2009   low GB EF, no gallstones  . COLONOSCOPY    . LAPAROSCOPY     of adhesions/ of ovaries  . UPPER GASTROINTESTINAL ENDOSCOPY    . VAGINAL HYSTERECTOMY      Family History  Problem Relation Age of Onset  . Colon cancer Father   . Hypertension Father   . Hyperlipidemia Father   . Obesity Father   . Heart disease Mother   . Hypertension Mother   . Hyperlipidemia Mother   . Obesity Mother   . Diabetes Brother        Half  . Heart disease Brother   . Diabetes Sister   . Breast cancer Paternal Grandmother   . Esophageal cancer Neg Hx   . Stomach cancer Neg Hx   . Rectal cancer Neg Hx     Allergies  Allergen Reactions  . Crestor [Rosuvastatin Calcium]     Muscle aches    Current Outpatient Medications on File  Prior to Visit  Medication Sig Dispense Refill  . hydrochlorothiazide (HYDRODIURIL) 25 MG tablet Take 1 tablet (25 mg total) by mouth daily. 90 tablet 1  . lidocaine (LIDODERM) 5 % Place 1 patch onto the skin daily. Remove & Discard patch within 12 hours or as directed by MD. 90 patch 1  . linaclotide (LINZESS) 290 MCG CAPS capsule Take 1 capsule (290 mcg total) by mouth daily before breakfast. 90 capsule 1  . meloxicam (MOBIC) 7.5 MG tablet Take 1 tablet (7.5 mg total) by mouth daily. 30 tablet 1  . pantoprazole (PROTONIX) 40 MG tablet Take 1 tablet (40 mg total) by mouth 2 (two) times daily. 60 tablet 11  . Pitavastatin Calcium (LIVALO) 4 MG TABS Take 1 tablet (4 mg total) by mouth daily. 90 tablet 1  . Prucalopride Succinate (MOTEGRITY) 2 MG TABS Take 1 tablet (2 mg total) by mouth daily. 30 tablet 11   No current facility-administered medications on file prior to visit.    There were no vitals taken for this visit.     Observations/Objective:   Gen: Awake, alert, no acute distress, no  ENT: no obvious swelling noted of forehead Resp: Breathing is even and non-labored Psych: calm/pleasant demeanor Neuro: Alert and Oriented x 3, + facial symmetry, speech is clear.   Assessment and Plan:  Sinusitis- will rx with amoxicillin.  Pt is advised to call if new/worsening symptoms or if symptoms are not improved in 2-3 days. Pt verbalizes understanding.  Follow Up Instructions:    I discussed the assessment and treatment plan with the patient. The patient was provided an opportunity to ask questions and all were answered. The patient agreed with the plan and demonstrated an understanding of the instructions.   The patient was advised to call back or seek an in-person evaluation if the symptoms worsen or if the condition fails to improve as anticipated.  Nance Pear, NP

## 2019-10-05 ENCOUNTER — Encounter: Payer: Self-pay | Admitting: Family

## 2019-10-07 ENCOUNTER — Telehealth: Payer: Self-pay | Admitting: Family

## 2019-10-07 DIAGNOSIS — J9801 Acute bronchospasm: Secondary | ICD-10-CM | POA: Diagnosis not present

## 2019-10-07 DIAGNOSIS — Z03818 Encounter for observation for suspected exposure to other biological agents ruled out: Secondary | ICD-10-CM | POA: Diagnosis not present

## 2019-10-07 DIAGNOSIS — I10 Essential (primary) hypertension: Secondary | ICD-10-CM | POA: Diagnosis not present

## 2019-10-07 NOTE — Telephone Encounter (Signed)
Please advise pt to go to urgent care where they can examine her and perform covid testing.

## 2019-10-07 NOTE — Telephone Encounter (Signed)
Pt states Melissa told her to call back in  3 days to call back if she wasn't feeling better. She states her chest is still congested she's coughing  Off and on throat irritation with drainage headaches weakness, denies any other sxs. She stated she might stop by [facility] to get a covid test. She request for a call back to discuss further. Thanks

## 2019-10-07 NOTE — Telephone Encounter (Signed)
Notified pt and she states she has an appt today at 4pm.

## 2019-10-18 ENCOUNTER — Other Ambulatory Visit: Payer: Self-pay

## 2019-10-18 MED ORDER — HYDROCHLOROTHIAZIDE 25 MG PO TABS: 25.0000 mg | ORAL_TABLET | Freq: Every day | ORAL | 1 refills | Status: DC

## 2019-11-05 ENCOUNTER — Ambulatory Visit: Payer: BC Managed Care – PPO | Admitting: Family

## 2019-11-05 ENCOUNTER — Other Ambulatory Visit: Payer: Self-pay

## 2019-11-05 ENCOUNTER — Encounter: Payer: Self-pay | Admitting: Family

## 2019-11-05 VITALS — BP 126/88 | HR 86 | Temp 96.6°F | Resp 18 | Ht 67.0 in | Wt 240.0 lb

## 2019-11-05 DIAGNOSIS — R05 Cough: Secondary | ICD-10-CM | POA: Diagnosis not present

## 2019-11-05 DIAGNOSIS — I10 Essential (primary) hypertension: Secondary | ICD-10-CM

## 2019-11-05 DIAGNOSIS — R0789 Other chest pain: Secondary | ICD-10-CM | POA: Diagnosis not present

## 2019-11-05 DIAGNOSIS — R0602 Shortness of breath: Secondary | ICD-10-CM | POA: Diagnosis not present

## 2019-11-05 DIAGNOSIS — R739 Hyperglycemia, unspecified: Secondary | ICD-10-CM | POA: Diagnosis not present

## 2019-11-05 DIAGNOSIS — R059 Cough, unspecified: Secondary | ICD-10-CM

## 2019-11-05 LAB — BASIC METABOLIC PANEL
BUN: 11 mg/dL (ref 6–23)
CO2: 33 mEq/L — ABNORMAL HIGH (ref 19–32)
Calcium: 9.6 mg/dL (ref 8.4–10.5)
Chloride: 98 mEq/L (ref 96–112)
Creatinine, Ser: 0.71 mg/dL (ref 0.40–1.20)
GFR: 99.99 mL/min (ref >60.00)
Glucose, Bld: 142 mg/dL — ABNORMAL HIGH (ref 70–99)
Potassium: 3.7 mEq/L (ref 3.5–5.1)
Sodium: 139 mEq/L (ref 135–145)

## 2019-11-05 LAB — HEMOGLOBIN A1C: Hgb A1c MFr Bld: 6.7 % — ABNORMAL HIGH (ref 4.6–6.5)

## 2019-11-05 LAB — D-DIMER, QUANTITATIVE: D-Dimer, Quant: 0.3 mcg/mL FEU (ref <0.50)

## 2019-11-05 NOTE — Patient Instructions (Signed)
Please complete lab work prior to leaving. Complete chest x-ray on the first floor. We will contact you with your results and further recommendations.

## 2019-11-05 NOTE — Progress Notes (Signed)
Subjective:    Patient ID: [name], female    DOB: [birth year], 64 y.o.   MRN: [id number]  HPI  Patient is a 64 yr old female who presents today with chief complaint of cough. We saw her 1 month ago due to sinus infection and she was treated with amoxicillin. She notes that her sinus congestion improved but she continues to have congestion in her chest.    Went to [facility] 2 weeks ago and was treated with amoxicillin for bronchitis.  She reports that she feels intermittent heaviness in her breathing. Reports some right sided pleuritic chest pain with breathing.  Feels like she can't get a deep breath sometimes.  Took prednisone x 7 days with minimal improvement.    HTN- maintained on hctz.  BP Readings from Last 3 Encounters:  11/05/19 126/88  06/29/19 124/84  05/25/19 140/74    Review of Systems See HPI  Past Medical History:  Diagnosis Date  . Arthritis   . Back pain   . Benign fundic gland polyps of stomach   . Constipation   . Diabetes mellitus without complication ([place])   . Esophagitis    LA Class B  . Fatigue   . Fatty liver   . Fatty liver   . Fibromyalgia   . Gallbladder problem   . GERD (gastroesophageal reflux disease)   . Heart murmur   . Hyperlipidemia   . Hypertension   . IBS (irritable bowel syndrome)   . Joint pain   . Neuromuscular disorder ([place])   . Prediabetes      Social History   Socioeconomic History  . Marital status: Married    Spouse name: Not on file  . Number of children: Not on file  . Years of education: Not on file  . Highest education level: Not on file  Occupational History  . 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 Smoking status: Former Smoker    Packs/day: 0.10    Years: 10.00    Pack years: 1.00    Types: Cigarettes    Quit date: 07/14/1981    Years since quitting: 38.3  . Smokeless tobacco: Never Used  Substance and Sexual Activity  . Alcohol use: No  . Drug use: No  . Sexual activity:  Not Currently  Other Topics Concern  . 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 Difficulty of Paying Living Expenses:   Food Insecurity:   . Worried About Charity fundraiser in the Last Year:   . Arboriculturist in the Last Year:   Transportation Needs:   . Film/video editor (Medical):   Marland Kitchen Lack of Transportation (Non-Medical):   Physical Activity:   . Days of Exercise per Week:   . Minutes of Exercise per Session:   Stress:   . Feeling of Stress :   Social Connections:   . Frequency of Communication with Friends and Family:   . Frequency of Social Gatherings with Friends and Family:   . Attends Religious Services:   . Active Member of Clubs or Organizations:   .  Attends Archivist Meetings:   Marland Kitchen Marital Status:   Intimate Partner Violence:   . Fear of Current or Ex-Partner:   . Emotionally Abused:   Marland Kitchen Physically Abused:   . Sexually Abused:     Past Surgical History:  Procedure Laterality Date  . CHOLECYSTECTOMY  2009   low GB EF, no gallstones  . COLONOSCOPY    . LAPAROSCOPY     of adhesions/ of ovaries  . UPPER GASTROINTESTINAL ENDOSCOPY    . VAGINAL HYSTERECTOMY      Family History  Problem Relation Age of Onset  . Colon cancer Father   . Hypertension Father   . Hyperlipidemia Father   . Obesity Father   . Heart disease Mother   . Hypertension Mother   . Hyperlipidemia Mother   . Obesity Mother   . Diabetes Brother        Half  . Heart disease Brother   . Diabetes Sister   . Breast cancer Paternal Grandmother   . Esophageal cancer Neg Hx   . Stomach cancer Neg Hx   . Rectal cancer Neg Hx     Allergies    Allergen Reactions  . Crestor [Rosuvastatin Calcium]     Muscle aches    Current Outpatient Medications on File Prior to Visit  Medication Sig Dispense Refill  . albuterol (VENTOLIN HFA) 108 (90 Base) MCG/ACT inhaler     . hydrochlorothiazide (HYDRODIURIL) 25 MG tablet Take 1 tablet (25 mg total) by mouth daily. 90 tablet 1  . lidocaine (LIDODERM) 5 % Place 1 patch onto the skin daily. Remove & Discard patch within 12 hours or as directed by MD. 90 patch 1  . linaclotide (LINZESS) 290 MCG CAPS capsule Take 1 capsule (290 mcg total) by mouth daily before breakfast. 90 capsule 1  . meloxicam (MOBIC) 7.5 MG tablet Take 1 tablet (7.5 mg total) by mouth daily. 30 tablet 1  . pantoprazole (PROTONIX) 40 MG tablet Take 1 tablet (40 mg total) by mouth 2 (two) times daily. 60 tablet 11  . Pitavastatin Calcium (LIVALO) 4 MG TABS Take 1 tablet (4 mg total) by mouth daily. 90 tablet 1  . Prucalopride Succinate (MOTEGRITY) 2 MG TABS Take 1 tablet (2 mg total) by mouth daily. 30 tablet 11   No current facility-administered medications on file prior to visit.    BP 126/88 (BP Location: Left Arm, Patient Position: Sitting, Cuff Size: Large)   Pulse 86   Temp (!) 96.6 F (35.9 C) (Temporal)   Resp 18   Ht 5\' 7"  (1.702 m)   Wt 240 lb (108.9 kg)   SpO2 99%   BMI 37.59 kg/m       Objective:   Physical Exam Constitutional:      Appearance: She is well-developed.  HENT:     Right Ear: Tympanic membrane normal.     Left Ear: There is impacted cerumen.  Neck:     Thyroid: No thyroid mass or thyromegaly.  Cardiovascular:     Rate and Rhythm: Normal rate and regular rhythm.     Heart sounds: Normal heart sounds. No murmur.  Pulmonary:     Effort: Pulmonary effort is normal. No respiratory distress.     Breath sounds: Normal breath sounds. No wheezing.  Musculoskeletal:     Cervical back: Neck supple.  Lymphadenopathy:     Cervical: No cervical adenopathy.  Psychiatric:        Behavior:  Behavior normal.  Thought Content: Thought content normal.        Judgment: Judgment normal.           Assessment & Plan:  Cough- minimal improvement with amoxicillin, albuterol, prednisone. Will obtain CXR for further evaluation. Recommended mucinex for chest congestion.   Atypical chest pain- EKG tracing is personally reviewed.  EKG notes NSR.  No acute changes.   Will also obtain chest x-ray for further evaluation.  HTN- bp stable, continue hctz. Obtain follow up bmet.  Hyperglycemia- obtain follow up A1C.  This visit occurred during the SARS-CoV-2 public health emergency.  Safety protocols were in place, including screening questions prior to the visit, additional usage of staff PPE, and extensive cleaning of exam room while observing appropriate contact time as indicated for disinfecting solutions.

## 2019-11-08 ENCOUNTER — Telehealth: Payer: Self-pay | Admitting: Family

## 2019-11-08 NOTE — Telephone Encounter (Signed)
See mychart.  

## 2019-11-10 ENCOUNTER — Ambulatory Visit (HOSPITAL_BASED_OUTPATIENT_CLINIC_OR_DEPARTMENT_OTHER)
Admission: RE | Admit: 2019-11-10 | Discharge: 2019-11-10 | Disposition: A | Discharge status: Home / Self Care | Payer: BC Managed Care – PPO | Source: Ambulatory Visit | Attending: Family | Admitting: Family

## 2019-11-10 ENCOUNTER — Other Ambulatory Visit: Payer: Self-pay

## 2019-11-10 DIAGNOSIS — H40003 Preglaucoma, unspecified, bilateral: Secondary | ICD-10-CM | POA: Diagnosis not present

## 2019-11-10 DIAGNOSIS — R059 Cough, unspecified: Secondary | ICD-10-CM

## 2019-11-10 DIAGNOSIS — R05 Cough: Secondary | ICD-10-CM | POA: Diagnosis not present

## 2019-11-10 LAB — HM DIABETES EYE EXAM

## 2019-12-02 DIAGNOSIS — M25561 Pain in right knee: Secondary | ICD-10-CM | POA: Diagnosis not present

## 2019-12-02 DIAGNOSIS — M4316 Spondylolisthesis, lumbar region: Secondary | ICD-10-CM | POA: Diagnosis not present

## 2019-12-02 DIAGNOSIS — M419 Scoliosis, unspecified: Secondary | ICD-10-CM | POA: Diagnosis not present

## 2019-12-02 DIAGNOSIS — M5136 Other intervertebral disc degeneration, lumbar region: Secondary | ICD-10-CM | POA: Diagnosis not present

## 2020-01-14 DIAGNOSIS — J00 Acute nasopharyngitis [common cold]: Secondary | ICD-10-CM | POA: Diagnosis not present

## 2020-02-14 DIAGNOSIS — J0191 Acute recurrent sinusitis, unspecified: Secondary | ICD-10-CM | POA: Diagnosis not present

## 2020-02-18 ENCOUNTER — Encounter: Payer: Self-pay | Admitting: Family

## 2020-02-18 ENCOUNTER — Telehealth: Payer: Self-pay | Admitting: Family

## 2020-02-18 ENCOUNTER — Other Ambulatory Visit: Payer: Self-pay

## 2020-02-18 ENCOUNTER — Ambulatory Visit: Payer: BC Managed Care – PPO | Admitting: Family

## 2020-02-18 VITALS — BP 142/82 | HR 84 | Temp 98.3°F | Resp 16 | Ht 68.0 in | Wt 240.0 lb

## 2020-02-18 DIAGNOSIS — Z23 Encounter for immunization: Secondary | ICD-10-CM

## 2020-02-18 DIAGNOSIS — M791 Myalgia, unspecified site: Secondary | ICD-10-CM

## 2020-02-18 DIAGNOSIS — I10 Essential (primary) hypertension: Secondary | ICD-10-CM | POA: Diagnosis not present

## 2020-02-18 DIAGNOSIS — E785 Hyperlipidemia, unspecified: Secondary | ICD-10-CM

## 2020-02-18 DIAGNOSIS — E1121 Type 2 diabetes mellitus with diabetic nephropathy: Secondary | ICD-10-CM

## 2020-02-18 NOTE — Patient Instructions (Signed)
Please complete lab work prior to leaving.   

## 2020-02-18 NOTE — Telephone Encounter (Signed)
Records release request faxed to [facility]

## 2020-02-18 NOTE — Telephone Encounter (Signed)
Please call and request DM eye report, [facility].

## 2020-02-18 NOTE — Progress Notes (Signed)
Subjective:    Patient ID: [name], female    DOB: [birth year], 64 y.o.   MRN: [id number]  HPI   Patient is a 64 yr old female who presents today to discuss body aches.  She reports that this has been going on for quite a while.   She has a hx of right knee pain and has had a steroid injection.   She reports that she had some chest congestion which is improving. States she had a negative covid test.  (this has been present x 1 month).   DM2- diet controlled.  Reports that she feels like she is addicted to sugar. "I crave it."  Lab Results  Component Value Date   HGBA1C 6.7 (H) 11/05/2019   HGBA1C 6.2 05/25/2019   HGBA1C 6.4 10/13/2018   Lab Results  Component Value Date   MICROALBUR 1.4 05/25/2019   LDLCALC 148 (H) 10/13/2018   CREATININE 0.71 11/05/2019       Review of Systems    see HPI  Past Medical History:  Diagnosis Date  . Arthritis   . Back pain   . Benign fundic gland polyps of stomach   . Constipation   . Diabetes mellitus without complication ([place])   . Esophagitis    LA Class B  . Fatigue   . Fatty liver   . Fatty liver   . Fibromyalgia   . Gallbladder problem   . GERD (gastroesophageal reflux disease)   . Heart murmur   . Hyperlipidemia   . Hypertension   . IBS (irritable bowel syndrome)   . Joint pain   . Neuromuscular disorder ([place])   . Prediabetes      Social History   Socioeconomic History  . Marital status: Married    Spouse name: Not on file  . Number of children: Not on file  . Years of education: Not on file  . Highest education level: Not on file  Occupational History  . 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 Smoking status: Former Smoker    Packs/day: 0.10    Years: 10.00    Pack years: 1.00    Types: Cigarettes    Quit date: 07/14/1981    Years since quitting: 38.6  . Smokeless tobacco: Never Used  Vaping Use  . Vaping Use: Never used  Substance and Sexual Activity  . Alcohol use: No  .  Drug use: No  . Sexual activity: Not Currently  Other Topics Concern  . 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 Difficulty of Paying Living Expenses: Not on file  Food Insecurity:   . Worried About Charity fundraiser in the Last Year: Not on file  . Ran Out of Food in the Last Year: Not on file  Transportation Needs:   . Lack of Transportation (Medical): Not on file  . Lack of Transportation (Non-Medical): Not on file  Physical Activity:   . Days of Exercise per Week: Not on file  . Minutes of Exercise per Session: Not on file  Stress:   . Feeling of Stress :  Not on file  Social Connections:   . Frequency of Communication with Friends and Family: Not on file  . Frequency of Social Gatherings with Friends and Family: Not on file  . Attends Religious Services: Not on file  . Active Member of Clubs or Organizations: Not on file  . Attends Archivist Meetings: Not on file  . Marital Status: Not on file  Intimate Partner Violence:   . Fear of Current or Ex-Partner: Not on file  . Emotionally Abused: Not on file  . Physically Abused: Not on file  . Sexually Abused: Not on file    Past Surgical History:  Procedure Laterality Date  . CHOLECYSTECTOMY  2009   low GB EF, no gallstones  . COLONOSCOPY    . LAPAROSCOPY     of adhesions/ of ovaries  . UPPER GASTROINTESTINAL ENDOSCOPY    . VAGINAL HYSTERECTOMY      Family History  Problem Relation Age of Onset  . Colon cancer Father   . Hypertension Father   . Hyperlipidemia Father   . Obesity Father   . Heart disease Mother   . Hypertension Mother   . Hyperlipidemia Mother   .  Obesity Mother   . Diabetes Brother        Half  . Heart disease Brother   . Diabetes Sister   . Breast cancer Paternal Grandmother   . Esophageal cancer Neg Hx   . Stomach cancer Neg Hx   . Rectal cancer Neg Hx     Allergies  Allergen Reactions  . Crestor [Rosuvastatin Calcium]     Muscle aches    Current Outpatient Medications on File Prior to Visit  Medication Sig Dispense Refill  . albuterol (VENTOLIN HFA) 108 (90 Base) MCG/ACT inhaler     . azelastine (ASTELIN) 0.1 % nasal spray Place 1 spray into both nostrils 2 (two) times daily.    . benzonatate (TESSALON) 200 MG capsule Take 200 mg by mouth 3 (three) times daily.    . fexofenadine (ALLEGRA) 60 MG tablet Take 60 mg by mouth 2 (two) times daily.    . hydrochlorothiazide (HYDRODIURIL) 25 MG tablet Take 1 tablet (25 mg total) by mouth daily. 90 tablet 1  . lidocaine (LIDODERM) 5 % Place 1 patch onto the skin daily. Remove & Discard patch within 12 hours or as directed by MD. 90 patch 1  . linaclotide (LINZESS) 290 MCG CAPS capsule Take 1 capsule (290 mcg total) by mouth daily before breakfast. 90 capsule 1  . meloxicam (MOBIC) 7.5 MG tablet Take 1 tablet (7.5 mg total) by mouth daily. 30 tablet 1  . pantoprazole (PROTONIX) 40 MG tablet Take 1 tablet (40 mg total) by mouth 2 (two) times daily. 60 tablet 11  . Pitavastatin Calcium (LIVALO) 4 MG TABS Take 1 tablet (4 mg total) by mouth daily. 90 tablet 1  . Prucalopride Succinate (MOTEGRITY) 2 MG TABS Take 1 tablet (2 mg total) by mouth daily. 30 tablet 11   No current facility-administered medications on file prior to visit.    BP (!) 142/82 (BP Location: Right Arm, Patient Position: Sitting, Cuff Size: Large)   Pulse 84   Temp 98.3 F (36.8 C) (Oral)   Resp 16   Ht '5\' 8"'  (1.727 m)   Wt 240 lb (108.9 kg)   SpO2 99%   BMI 36.49 kg/m    Objective:   Physical Exam Constitutional:      Appearance: She is well-developed.  Cardiovascular:  Rate and Rhythm: Normal  rate and regular rhythm.     Heart sounds: Normal heart sounds. No murmur heard.   Pulmonary:     Effort: Pulmonary effort is normal. No respiratory distress.     Breath sounds: Normal breath sounds. No wheezing.  Musculoskeletal:     Comments: No joint swelling noted  Psychiatric:        Behavior: Behavior normal.        Thought Content: Thought content normal.        Judgment: Judgment normal.           Assessment & Plan:  Myalgias- will check ESR, ANA, RA. ? If related to her FM.    Hyperlipidemia- never started livalo but does have it on hand. I suggested she start livalo and let me know if she has worsening myalgia.  DM2- we discussed diabetic diet, exercise and weight loss. She would like a referral to be placed to nutrition which I have placed.  Chest congestion- improving. Normal lung exam. Monitor.  Flu shot today.  This visit occurred during the SARS-CoV-2 public health emergency.  Safety protocols were in place, including screening questions prior to the visit, additional usage of staff PPE, and extensive cleaning of exam room while observing appropriate contact time as indicated for disinfecting solutions.

## 2020-02-21 ENCOUNTER — Other Ambulatory Visit: Payer: Self-pay | Admitting: Family

## 2020-02-21 LAB — HEMOGLOBIN A1C
Hgb A1c MFr Bld: 7.3 % of total Hgb — ABNORMAL HIGH (ref <5.7)
Mean Plasma Glucose: 163 (calc)
eAG (mmol/L): 9 (calc)

## 2020-02-21 LAB — SEDIMENTATION RATE: Sed Rate: 14 mm/h (ref 0–30)

## 2020-02-21 LAB — ANA: Anti Nuclear Antibody (ANA): NEGATIVE

## 2020-02-21 LAB — RHEUMATOID FACTOR: Rheumatoid fact SerPl-aCnc: <14 IU/mL (ref <14)

## 2020-02-21 MED ORDER — DULOXETINE HCL 30 MG PO CPEP: ORAL_CAPSULE | ORAL | 0 refills | Status: DC

## 2020-02-21 MED ORDER — METFORMIN HCL 500 MG PO TABS: 500.0000 mg | ORAL_TABLET | Freq: Two times a day (BID) | ORAL | 1 refills | Status: DC

## 2020-02-21 NOTE — Telephone Encounter (Signed)
Please advise pt that her sugar has risen (A1C is 7.3 up from 6.7).  I would recommend that she continue to work on diet/exercise and weight loss and also add metformin 500mg  bid.    Also, autoimmune testing is normal.  I suspect that her muscle pain is related to fibromyalgia. Cymbalta can help with fibromyalgia pain.  I have pended the medications. I would like to see her back in 1 month please.

## 2020-02-21 NOTE — Telephone Encounter (Signed)
Advised patient of results, provider's advise and new prescriptions. She verbalized understanding and was scheduled to come back 03-20-20.

## 2020-03-07 DIAGNOSIS — M25561 Pain in right knee: Secondary | ICD-10-CM | POA: Diagnosis not present

## 2020-03-09 ENCOUNTER — Other Ambulatory Visit: Payer: Self-pay

## 2020-03-09 ENCOUNTER — Encounter: Payer: Self-pay | Admitting: Dietician

## 2020-03-09 ENCOUNTER — Encounter: Payer: BC Managed Care – PPO | Attending: Family | Admitting: Dietician

## 2020-03-09 ENCOUNTER — Telehealth: Payer: Self-pay | Admitting: Family

## 2020-03-09 DIAGNOSIS — E1121 Type 2 diabetes mellitus with diabetic nephropathy: Secondary | ICD-10-CM

## 2020-03-09 DIAGNOSIS — Z0000 Encounter for general adult medical examination without abnormal findings: Secondary | ICD-10-CM

## 2020-03-09 NOTE — Telephone Encounter (Signed)
Patient would like a referral to gynecologist in the [place]

## 2020-03-09 NOTE — Patient Instructions (Addendum)
Call your insurance to verify your preferred blood glucose meter.  Does your insurance cover the [employer]? Call your doctor to get a prescription for lancet needles and strips for the meter that is preferred.  Consider Chia seeds (as tolerated) mixed in oatmeal or almond milk to see if this helps with constipation.  Slowly increase your fiber intake.  Find ways to increase your non starchy vegetables and legumes.  Great job on getting started with exercise.  Find ways to incorporate this most days for about 30 minutes.   Eat more Non-Starchy Vegetables . These include greens, broccoli, cauliflower, cabbage, carrots, beets, eggplant, peppers, squash and others. Minimize added sugars and refined grains . Rethink what you drink.  Choose beverages without added sugar.  Look for 0 carbs on the label. . See the list of whole grains below.  Find alternatives to usual sweet treats. Choose whole foods over processed. Make simple meals at home more often than eating out.  Tips to increase fiber in your diet: (All plants have fiber.  Eat a variety. There are more than are on this list.) Slowly increase the amount of fiber you eat to 25-35 grams per day.  (More is fine if you tolerate it.) . Fiber from whole grains, nuts and seeds o Quinoa, 1/2 cup = 5 grams o Bulgur, 1/2 cup = 4.1 grams o Popcorn, 3 cups = 3.6 grams o Whole Wheat Spaghetti, 1/2 cup = 3.2 grams o Barley, 1/2 cup = 3 grams o Oatmeal, 1/2 cup = 2 grams o Whole Wheat English Muffin = 3 grams o Corn, 1/2 cup = 2.1 grams o Brown Rice, 1/2 cup = 1.8 grams o Flax seeds, 1 Tablespoon = 2.8 grams o Chia seeds, 1 Tablespoon = 11 grams o Almonds, 1 ounce = 3.5 grams fiber . Fiber from legumes o Kidney beans, 1/2 cup 7.9 grams o Lentils, 1/2 cup = 7.8 grams o Pinto beans, 1/2 cup = 7.7 grams o Black beans, 1/2 cup = 7.6 grams o Lima beans, 1/2 cup 6.4 grams o Chick peas, 1/2 cup = 5.3 grams o Black eyed peas, 1/2 cup = 4  grams . Fiber from fruits and vegetables o Pear, 6 grams o Apple. 3.3 grams o Raspberries or Blackberries, 3/4 cup = 6 grams o Strawberries or Blueberries, 1 cup = 3.4 grams o Baked sweet potato 3.8 grams fiber o Baked potato with skin 4.4 grams  o Peas, 1/2 cup = 4.4 grams  o Spinach, 1/2 cup cooked = 3.5 grams  o Avocado, 1/2 = 5 grams

## 2020-03-09 NOTE — Progress Notes (Signed)
Diabetes Self-Management Education  Visit Type: First/Initial  Appt. Start Time: 1400 Appt. End Time: 2409  03/13/2020  [name], identified by name and date of birth, is a 64 y.o. female with a diagnosis of Diabetes: Type 2.   ASSESSMENT Patient is here today alone.  She would like to learn how to manage her diabetes without medication.  She is working on trying to eat properly.  History includes Type 2 Diabetes, nephropathy, IBS/constipatio, GERD, HLD, HTN, fibromyalgia Medications include Metformin, vitamin D3 She had a cortisone injection in her leg 2 days ago. Labs note include:  GFR 99 11/05/2019, A1C 7.3% 02/18/2020 increased from 6.7% 11/05/2019  She does not check her blood sugar.  She did this routinely for her late husband. Provided a Contour Next One Expiration:  10/31/2020 Lot [id number] And instructed her on its use.  Blood glucose was 106.  Weight hx: 242 lbs per patient today 232 lbs about 3 years ago Goal:  Less than 200 lbs  Patient lives alone.  Widowed about 3 years ago.  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 Cannot digest meat well particularly at night. Knee pain inhibits exercise. Was exercising more routinely prior to covid.  Height 5\' 8"  (1.727 m), weight 242 lb (109.8 kg). Body mass index is 36.8 kg/m.   Diabetes Self-Management Education - 03/09/20 1421      Visit Information   Visit Type First/Initial      Initial Visit   Diabetes Type Type 2    Are you currently following a meal plan? Yes    What type of meal plan do you follow? diabetic    Are you taking your medications as prescribed? Yes    Date Diagnosed 2019      Health Coping   How would you rate your overall health? Good      Psychosocial Assessment   Patient Belief/Attitude about Diabetes Motivated to manage diabetes    Self-care barriers None    Self-management support Doctor's office    Other persons present Patient    Patient Concerns Nutrition/Meal  planning;Glycemic Control;Healthy Lifestyle;Weight Control    Special Needs None    Preferred Learning Style No preference indicated    Learning Readiness Ready    How often do you need to have someone help you when you read instructions, pamphlets, or other written materials from your doctor or pharmacy? 1 - Never    What is the last grade level you completed in school? 16      Pre-Education Assessment   Patient understands the diabetes disease and treatment process. Needs Instruction    Patient understands incorporating nutritional management into lifestyle. Needs Instruction    Patient undertands incorporating physical activity into lifestyle. Needs Instruction    Patient understands using medications safely. Needs Instruction    Patient understands monitoring blood glucose, interpreting and using results Needs Instruction    Patient understands prevention, detection, and treatment of acute complications. Needs Instruction    Patient understands prevention, detection, and treatment of chronic complications. Needs Instruction    Patient understands how to develop strategies to address psychosocial issues. Needs Instruction    Patient understands how to develop strategies to promote health/change behavior. Needs Instruction      Complications   Last HgB A1C per patient/outside source 7.3 %   02/18/2020 increased from 6.7% 11/05/2019   How often do you check your blood sugar? 0 times/day (not testing)    Have you had a dilated eye exam  in the past 12 months? Yes    Have you had a dental exam in the past 12 months? Yes    Are you checking your feet? Yes    How many days per week are you checking your feet? 2      Dietary Intake   Breakfast 1 egg, English muffin or bagel thin, beyond sausage, apple    Snack (morning) none    Lunch [place] sandwich on 45 calorie bread OR skipped today OR potato chips    Snack (afternoon) Kombucha    Dinner brussel sprouts, baked potato with butter, sour  cream, cheese    Snack (evening) none    Beverage(s) water, kombucha, coffee with sweet and low and non dairy coffeemate      Exercise   Exercise Type Light (walking / raking leaves)    How many days per week to you exercise? 3    How many minutes per day do you exercise? 20    Total minutes per week of exercise 60      Patient Education   Previous Diabetes Education No    Disease state  Definition of diabetes, type 1 and 2, and the diagnosis of diabetes    Nutrition management  Role of diet in the treatment of diabetes and the relationship between the three main macronutrients and blood glucose level;Food label reading, portion sizes and measuring food.;Information on hints to eating out and maintain blood glucose control.    Physical activity and exercise  Role of exercise on diabetes management, blood pressure control and cardiac health.;Helped patient identify appropriate exercises in relation to his/her diabetes, diabetes complications and other health issue.    Medications Reviewed patients medication for diabetes, action, purpose, timing of dose and side effects.    Monitoring Taught/evaluated SMBG meter.;Purpose and frequency of SMBG.;Taught/discussed recording of test results and interpretation of SMBG.;Identified appropriate SMBG and/or A1C goals.;Daily foot exams;Yearly dilated eye exam    Acute complications Taught treatment of hypoglycemia - the 15 rule.;Discussed and identified patients' treatment of hyperglycemia.    Chronic complications Relationship between chronic complications and blood glucose control;Retinopathy and reason for yearly dilated eye exams    Psychosocial adjustment Worked with patient to identify barriers to care and solutions;Role of stress on diabetes;Identified and addressed patients feelings and concerns about diabetes      Individualized Goals (developed by patient)   Nutrition General guidelines for healthy choices and portions discussed    Physical  Activity Exercise 5-7 days per week;30 minutes per day    Medications take my medication as prescribed    Monitoring  test my blood glucose as discussed    Reducing Risk examine blood glucose patterns;increase portions of healthy fats    Health Coping discuss diabetes with (comment)      Post-Education Assessment   Patient understands the diabetes disease and treatment process. Demonstrates understanding / competency    Patient understands incorporating nutritional management into lifestyle. Needs Review    Patient undertands incorporating physical activity into lifestyle. Demonstrates understanding / competency    Patient understands using medications safely. Demonstrates understanding / competency    Patient understands monitoring blood glucose, interpreting and using results Demonstrates understanding / competency    Patient understands prevention, detection, and treatment of acute complications. Demonstrates understanding / competency    Patient understands prevention, detection, and treatment of chronic complications. Demonstrates understanding / competency    Patient understands how to develop strategies to address psychosocial issues. Demonstrates understanding / competency  Patient understands how to develop strategies to promote health/change behavior. Needs Review      Outcomes   Expected Outcomes Demonstrated interest in learning. Expect positive outcomes    Future DMSE PRN    Program Status Completed           Individualized Plan for Diabetes Self-Management Training:   Learning Objective:  Patient will have a greater understanding of diabetes self-management. Patient education plan is to attend individual and/or group sessions per assessed needs and concerns.   Plan:   Patient Instructions  Call your insurance to verify your preferred blood glucose meter.  Does your insurance cover the [employer]? Call your doctor to get a prescription for lancet needles and  strips for the meter that is preferred.  Consider Chia seeds (as tolerated) mixed in oatmeal or almond milk to see if this helps with constipation.  Slowly increase your fiber intake.  Find ways to increase your non starchy vegetables and legumes.  Great job on getting started with exercise.  Find ways to incorporate this most days for about 30 minutes.   Eat more Non-Starchy Vegetables . These include greens, broccoli, cauliflower, cabbage, carrots, beets, eggplant, peppers, squash and others. Minimize added sugars and refined grains . Rethink what you drink.  Choose beverages without added sugar.  Look for 0 carbs on the label. . See the list of whole grains below.  Find alternatives to usual sweet treats. Choose whole foods over processed. Make simple meals at home more often than eating out.  Tips to increase fiber in your diet: (All plants have fiber.  Eat a variety. There are more than are on this list.) Slowly increase the amount of fiber you eat to 25-35 grams per day.  (More is fine if you tolerate it.) . Fiber from whole grains, nuts and seeds o Quinoa, 1/2 cup = 5 grams o Bulgur, 1/2 cup = 4.1 grams o Popcorn, 3 cups = 3.6 grams o Whole Wheat Spaghetti, 1/2 cup = 3.2 grams o Barley, 1/2 cup = 3 grams o Oatmeal, 1/2 cup = 2 grams o Whole Wheat English Muffin = 3 grams o Corn, 1/2 cup = 2.1 grams o Brown Rice, 1/2 cup = 1.8 grams o Flax seeds, 1 Tablespoon = 2.8 grams o Chia seeds, 1 Tablespoon = 11 grams o Almonds, 1 ounce = 3.5 grams fiber . Fiber from legumes o Kidney beans, 1/2 cup 7.9 grams o Lentils, 1/2 cup = 7.8 grams o Pinto beans, 1/2 cup = 7.7 grams o Black beans, 1/2 cup = 7.6 grams o Lima beans, 1/2 cup 6.4 grams o Chick peas, 1/2 cup = 5.3 grams o Black eyed peas, 1/2 cup = 4 grams . Fiber from fruits and vegetables o Pear, 6 grams o Apple. 3.3 grams o Raspberries or Blackberries, 3/4 cup = 6 grams o Strawberries or Blueberries, 1 cup = 3.4  grams o Baked sweet potato 3.8 grams fiber o Baked potato with skin 4.4 grams  o Peas, 1/2 cup = 4.4 grams  o Spinach, 1/2 cup cooked = 3.5 grams  o Avocado, 1/2 = 5 grams           Expected Outcomes:  Demonstrated interest in learning. Expect positive outcomes  Education material provided: ADA - How to Thrive: A Guide for Your Journey with Diabetes, Meal plan card and Snack sheet  If problems or questions, patient to contact team via:  Phone  Future DSME appointment: PRN

## 2020-03-10 MED ORDER — MELOXICAM 7.5 MG PO TABS
7.5000 mg | ORAL_TABLET | Freq: Every day | ORAL | 1 refills | Status: DC
Start: 2020-03-10 — End: 2020-05-10

## 2020-03-10 NOTE — Telephone Encounter (Signed)
Referral requested

## 2020-03-10 NOTE — Telephone Encounter (Signed)
Patient advised of referral.   She will like to get a refill for Meloxicam for back pain and "feels inflamed. She had a refill on rx from 2020 but is now expired"

## 2020-03-10 NOTE — Telephone Encounter (Signed)
Referral has been placed. 

## 2020-03-19 NOTE — Progress Notes (Signed)
Subjective:    Patient ID: [name], female    DOB: January 13, 1956, 64 y.o.   MRN: [id number]  HPI  Patient is a 64 yr old female who presents today for follow up of her myalgias.  Lab work included ESR, ANA, and Rheumatoid factor.  These labs were WNL.  She does have a hx of fibromyalgia. She did not start the cymbalta last visit because she says the pharmacy did not give it to her.   DM2- she reports good compliance with metformin, however it causes her constipation. She would like to try something different.  Lab Results  Component Value Date   HGBA1C 7.3 (H) 02/18/2020   HGBA1C 6.7 (H) 11/05/2019   HGBA1C 6.2 05/25/2019   Lab Results  Component Value Date   MICROALBUR 1.4 05/25/2019   LDLCALC 148 (H) 10/13/2018   CREATININE 0.71 11/05/2019   Hyperlipidemia- stopped livalo- plans to restart.    IBS-C- linzess is no longer working so she stopped. She states she has tried [place] in the past as well. Both worked initially then stopped working.   Hypertension- continues HCTZ BP Readings from Last 3 Encounters:  03/20/20 (!) 141/89  02/18/20 (!) 142/82  11/05/19 126/88   Uses meloxicam for OA- sciatica acts up sometimes.      Review of Systems See HPI  Past Medical History:  Diagnosis Date   Arthritis    Back pain    Benign fundic gland polyps of stomach    Constipation    Diabetes mellitus without complication (HCC)    Esophagitis    LA Class B   Fatigue    Fatty liver    Fatty liver    Fibromyalgia    Gallbladder problem    GERD (gastroesophageal reflux disease)    Heart murmur    Hyperlipidemia    Hypertension    IBS (irritable bowel syndrome)    Joint pain    Neuromuscular disorder (HCC)    Prediabetes      Social History   Socioeconomic History   Marital status: Married    Spouse name: Not on file   Number of children: Not on file   Years of education: Not on file   Highest education level: Not on file    Occupational History   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  Tobacco Use   Smoking status: Former Smoker    Packs/day: 0.10    Years: 10.00    Pack years: 1.00    Types: Cigarettes    Quit date: 07/14/1981    Years since quitting: 38.7   Smokeless tobacco: Never Used  Vaping Use   Vaping Use: Never used  Substance and Sexual Activity   Alcohol use: No   Drug use: No   Sexual activity: Not Currently  Other Topics Concern   Not on file  Social History Narrative   Widowed- husband passed last year   2 grown sons, grandchildren   Works for a Museum/gallery curator, Engineer, maintenance (IT)   No pets   Enjoys writing, reading, being a grandmother, walking   Engineer, water about christian books for self improvement for women   Completed college   Social Determinants of Radio broadcast assistant Strain:    Difficulty of Paying Living Expenses: Not on file  Food Insecurity:    Worried About Charity fundraiser in the Last Year: Not on file   [employer] of Food in the Last Year: Not on file  Transportation Needs:    Film/video editor (Medical): Not on file   Lack of Transportation (Non-Medical): Not on file  Physical Activity:    Days of Exercise per Week: Not on file   Minutes of Exercise per Session: Not on file  Stress:    Feeling of Stress : Not on file  Social Connections:    Frequency of Communication with Friends and Family: Not on file   Frequency of Social Gatherings with Friends and Family: Not on file   Attends Religious Services: Not on file   Active Member of Clubs or Organizations: Not on file   Attends Archivist Meetings: Not on file   Marital Status: Not on file  Intimate Partner Violence:    Fear of Current or Ex-Partner: Not on file   Emotionally Abused: Not on file   Physically Abused: Not on file   Sexually Abused: Not on file    Past Surgical History:  Procedure Laterality Date   CHOLECYSTECTOMY  2009   low GB  EF, no gallstones   COLONOSCOPY     LAPAROSCOPY     of adhesions/ of ovaries   UPPER GASTROINTESTINAL ENDOSCOPY     VAGINAL HYSTERECTOMY      Family History  Problem Relation Age of Onset   Colon cancer Father    Hypertension Father    Hyperlipidemia Father    Obesity Father    Heart disease Mother    Hypertension Mother    Hyperlipidemia Mother    Obesity Mother    Diabetes Brother        Half   Heart disease Brother    Diabetes Sister    Breast cancer Paternal Grandmother    Esophageal cancer Neg Hx    Stomach cancer Neg Hx    Rectal cancer Neg Hx     Allergies  Allergen Reactions   Crestor [Rosuvastatin Calcium]     Muscle aches    Current Outpatient Medications on File Prior to Visit  Medication Sig Dispense Refill   albuterol (VENTOLIN HFA) 108 (90 Base) MCG/ACT inhaler      Ascorbic Acid (VITAMIN C) 100 MG tablet Take 100 mg by mouth daily.     azelastine (ASTELIN) 0.1 % nasal spray Place 1 spray into both nostrils 2 (two) times daily.     cholecalciferol (VITAMIN D3) 25 MCG (1000 UNIT) tablet Take 1,000 Units by mouth daily.     fexofenadine (ALLEGRA) 60 MG tablet Take 60 mg by mouth 2 (two) times daily.     hydrochlorothiazide (HYDRODIURIL) 25 MG tablet Take 1 tablet (25 mg total) by mouth daily. 90 tablet 1   lidocaine (LIDODERM) 5 % Place 1 patch onto the skin daily. Remove & Discard patch within 12 hours or as directed by MD. 90 patch 1   meloxicam (MOBIC) 7.5 MG tablet Take 1 tablet (7.5 mg total) by mouth daily. 30 tablet 1   Multiple Vitamin (MULTIVITAMIN WITH MINERALS) TABS tablet Take 1 tablet by mouth daily.     pantoprazole (PROTONIX) 40 MG tablet Take 1 tablet (40 mg total) by mouth 2 (two) times daily. 60 tablet 11   Prucalopride Succinate (MOTEGRITY) 2 MG TABS Take 1 tablet (2 mg total) by mouth daily. 30 tablet 11   Saccharomyces boulardii (PROBIOTIC) 250 MG CAPS Take by mouth.     zinc gluconate 50 MG tablet Take  50 mg by mouth daily.     No current facility-administered medications on file prior to visit.  BP (!) 141/89 (BP Location: Right Arm, Patient Position: Sitting, Cuff Size: Large)    Pulse 71    Resp 16    Ht '5\' 8"'  (1.727 m)    Wt 237 lb (107.5 kg)    SpO2 99%    BMI 36.04 kg/m       Objective:   Physical Exam Constitutional:      Appearance: She is well-developed.  Cardiovascular:     Rate and Rhythm: Normal rate and regular rhythm.     Heart sounds: Normal heart sounds. No murmur heard.   Pulmonary:     Effort: Pulmonary effort is normal. No respiratory distress.     Breath sounds: Normal breath sounds. No wheezing.  Psychiatric:        Behavior: Behavior normal.        Thought Content: Thought content normal.        Judgment: Judgment normal.           Assessment & Plan:  HTN- bp acceptable for her age.  Continue hctz 5m once daily.  Hyperlipidemia- uncontrolled. Start livalo.   DM2- above goal.  Not tolerating metformin.  She saw the nutritionist which was helpful. She states that she plans to "do better" with her diet.  Fibromyalgia- trial of cymbalta.  DJD/DDD- stable on PRN meloxciam.   IBS-C- uncontrolled. Has tried multiple medications.  She is being followed by GI.  Shingrix #2 today.   This visit occurred during the SARS-CoV-2 public health emergency.  Safety protocols were in place, including screening questions prior to the visit, additional usage of staff PPE, and extensive cleaning of exam room while observing appropriate contact time as indicated for disinfecting solutions.

## 2020-03-20 ENCOUNTER — Other Ambulatory Visit: Payer: Self-pay

## 2020-03-20 ENCOUNTER — Ambulatory Visit: Payer: BC Managed Care – PPO | Admitting: Family

## 2020-03-20 ENCOUNTER — Encounter: Payer: Self-pay | Admitting: Family

## 2020-03-20 VITALS — BP 141/89 | HR 71 | Resp 16 | Ht 68.0 in | Wt 237.0 lb

## 2020-03-20 DIAGNOSIS — I10 Essential (primary) hypertension: Secondary | ICD-10-CM

## 2020-03-20 DIAGNOSIS — Z23 Encounter for immunization: Secondary | ICD-10-CM | POA: Diagnosis not present

## 2020-03-20 DIAGNOSIS — M179 Osteoarthritis of knee, unspecified: Secondary | ICD-10-CM

## 2020-03-20 DIAGNOSIS — E785 Hyperlipidemia, unspecified: Secondary | ICD-10-CM

## 2020-03-20 DIAGNOSIS — E1121 Type 2 diabetes mellitus with diabetic nephropathy: Secondary | ICD-10-CM

## 2020-03-20 DIAGNOSIS — K581 Irritable bowel syndrome with constipation: Secondary | ICD-10-CM

## 2020-03-20 DIAGNOSIS — M1710 Unilateral primary osteoarthritis, unspecified knee: Secondary | ICD-10-CM

## 2020-03-20 DIAGNOSIS — M797 Fibromyalgia: Secondary | ICD-10-CM

## 2020-03-20 DIAGNOSIS — M5136 Other intervertebral disc degeneration, lumbar region: Secondary | ICD-10-CM | POA: Diagnosis not present

## 2020-03-20 MED ORDER — LIVALO 4 MG PO TABS: 1.0000 | ORAL_TABLET | Freq: Every day | ORAL | 1 refills | Status: DC

## 2020-03-20 MED ORDER — SITAGLIPTIN PHOSPHATE 100 MG PO TABS: 100.0000 mg | ORAL_TABLET | Freq: Every day | ORAL | 5 refills | Status: DC

## 2020-03-20 MED ORDER — DULOXETINE HCL 30 MG PO CPEP: ORAL_CAPSULE | ORAL | 0 refills | Status: DC

## 2020-03-20 NOTE — Patient Instructions (Addendum)
Stop Metformin, Start [place]. Restart Livalo.   Start Cymbalta.

## 2020-03-22 ENCOUNTER — Ambulatory Visit: Payer: BC Managed Care – PPO | Admitting: Registered"

## 2020-05-10 ENCOUNTER — Other Ambulatory Visit: Payer: Self-pay | Admitting: Family

## 2020-05-18 ENCOUNTER — Other Ambulatory Visit: Payer: Self-pay | Admitting: Family

## 2020-05-21 ENCOUNTER — Emergency Department (HOSPITAL_BASED_OUTPATIENT_CLINIC_OR_DEPARTMENT_OTHER)
Admission: EM | Admit: 2020-05-21 | Discharge: 2020-05-21 | Disposition: A | Discharge status: Home / Self Care | Payer: BC Managed Care – PPO | Attending: Emergency Medicine | Admitting: Emergency Medicine

## 2020-05-21 ENCOUNTER — Encounter (HOSPITAL_BASED_OUTPATIENT_CLINIC_OR_DEPARTMENT_OTHER): Payer: Self-pay | Admitting: Emergency Medicine

## 2020-05-21 ENCOUNTER — Other Ambulatory Visit: Payer: Self-pay

## 2020-05-21 ENCOUNTER — Emergency Department (HOSPITAL_BASED_OUTPATIENT_CLINIC_OR_DEPARTMENT_OTHER): Payer: BC Managed Care – PPO

## 2020-05-21 DIAGNOSIS — Z87891 Personal history of nicotine dependence: Secondary | ICD-10-CM | POA: Diagnosis not present

## 2020-05-21 DIAGNOSIS — I10 Essential (primary) hypertension: Secondary | ICD-10-CM | POA: Insufficient documentation

## 2020-05-21 DIAGNOSIS — R519 Headache, unspecified: Secondary | ICD-10-CM | POA: Diagnosis not present

## 2020-05-21 DIAGNOSIS — Z7984 Long term (current) use of oral hypoglycemic drugs: Secondary | ICD-10-CM | POA: Diagnosis not present

## 2020-05-21 DIAGNOSIS — Z79899 Other long term (current) drug therapy: Secondary | ICD-10-CM | POA: Insufficient documentation

## 2020-05-21 DIAGNOSIS — R220 Localized swelling, mass and lump, head: Secondary | ICD-10-CM | POA: Diagnosis not present

## 2020-05-21 DIAGNOSIS — E119 Type 2 diabetes mellitus without complications: Secondary | ICD-10-CM | POA: Insufficient documentation

## 2020-05-21 NOTE — ED Provider Notes (Signed)
[facility] EMERGENCY DEPARTMENT Provider Note   CSN: [id number] Arrival date & time: 05/21/20  0703     History Chief Complaint  Patient presents with  . Facial Pain    [name] is a 64 y.o. female.  64yo F w/ PMH below who p/w forehead swelling and pain. Pt reports 3 weeks of intermittent swelling on R forehead above eyebrow, no hx of trauma. When pain is more severe, pain radiates along R scalp and sometimes down R side of face. No swelling of face. No other areas of swelling on the body. She denies any fevers, URI symptoms, or recent illness.   The history is provided by the patient.       Past Medical History:  Diagnosis Date  . Arthritis   . Back pain   . Benign fundic gland polyps of stomach   . Constipation   . Diabetes mellitus without complication ([place])   . Esophagitis    LA Class B  . Fatigue   . Fatty liver   . Fatty liver   . Fibromyalgia   . Gallbladder problem   . GERD (gastroesophageal reflux disease)   . Heart murmur   . Hyperlipidemia   . Hypertension   . IBS (irritable bowel syndrome)   . Joint pain   . Neuromuscular disorder ([place])   . Prediabetes     Patient Active Problem List   Diagnosis Date Noted  . Vitamin D deficiency disease 10/13/2018  . [place] 10/13/2018  . DDD (degenerative disc disease), lumbar 10/13/2018  . IBS (irritable bowel syndrome) 02/24/2017  . Obesity, Class II, BMI 35-39.9, with comorbidity 01/28/2013  . DJD (degenerative joint disease) of knee 11/08/2011  . Insomnia secondary to chronic pain 09/09/2011  . BACK PAIN, LUMBAR, WITH RADICULOPATHY 08/09/2009  . HYPERTENSION, BENIGN ESSENTIAL 08/03/2009  . NASH (nonalcoholic steatohepatitis) 10/25/2008  . Fibromyalgia muscle pain 10/25/2008  . Diabetes type 2, controlled ([place]) 05/25/2008  . Hyperlipidemia with target LDL less than 100 05/25/2008  . GERD 05/25/2008    Past Surgical History:  Procedure Laterality Date  . CHOLECYSTECTOMY  2009    low GB EF, no gallstones  . COLONOSCOPY    . LAPAROSCOPY     of adhesions/ of ovaries  . UPPER GASTROINTESTINAL ENDOSCOPY    . VAGINAL HYSTERECTOMY       OB History    Gravida  2   Para  2   Term  2   Preterm      AB      Living        SAB      IAB      Ectopic      Multiple      Live Births              Family History  Problem Relation Age of Onset  . Colon cancer Father   . Hypertension Father   . Hyperlipidemia Father   . Obesity Father   . Heart disease Mother   . Hypertension Mother   . Hyperlipidemia Mother   . Obesity Mother   . Diabetes Brother        Half  . Heart disease Brother   . Diabetes Sister   . Breast cancer Paternal Grandmother   . Esophageal cancer Neg Hx   . Stomach cancer Neg Hx   . Rectal cancer Neg Hx     Social History   Tobacco Use  . Smoking status: Former Smoker  Packs/day: 0.10    Years: 10.00    Pack years: 1.00    Types: Cigarettes    Quit date: 07/14/1981    Years since quitting: 38.8  . Smokeless tobacco: Never Used  Vaping Use  . Vaping Use: Never used  Substance Use Topics  . Alcohol use: No  . Drug use: No    Home Medications Prior to Admission medications   Medication Sig Start Date End Date Taking? Authorizing Provider  albuterol (VENTOLIN HFA) 108 (90 Base) MCG/ACT inhaler  10/27/19   [provider]  Ascorbic Acid (VITAMIN C) 100 MG tablet Take 100 mg by mouth daily.    [provider]  azelastine (ASTELIN) 0.1 % nasal spray Place 1 spray into both nostrils 2 (two) times daily. 02/14/20   [provider]  cholecalciferol (VITAMIN D3) 25 MCG (1000 UNIT) tablet Take 1,000 Units by mouth daily.    [provider]  DULoxetine (CYMBALTA) 30 MG capsule Take 2 capsules (60 mg total) by mouth daily. 05/18/20   [name], NP  fexofenadine (ALLEGRA) 60 MG tablet Take 60 mg by mouth 2 (two) times daily. 02/14/20   [provider]  hydrochlorothiazide  (HYDRODIURIL) 25 MG tablet Take 1 tablet (25 mg total) by mouth daily. 10/18/19   [name], NP  lidocaine (LIDODERM) 5 % Place 1 patch onto the skin daily. Remove & Discard patch within 12 hours or as directed by MD. 10/13/18   Janith Lima, MD  meloxicam (MOBIC) 7.5 MG tablet Take 1 tablet (7.5 mg total) by mouth daily as needed for pain. 05/10/20   [name], NP  Multiple Vitamin (MULTIVITAMIN WITH MINERALS) TABS tablet Take 1 tablet by mouth daily.    [provider]  pantoprazole (PROTONIX) 40 MG tablet Take 1 tablet (40 mg total) by mouth 2 (two) times daily. 06/29/19   Ladene Artist, MD  Pitavastatin Calcium (LIVALO) 4 MG TABS Take 1 tablet (4 mg total) by mouth daily. 03/20/20   [name], NP  Prucalopride Succinate (MOTEGRITY) 2 MG TABS Take 1 tablet (2 mg total) by mouth daily. 06/29/19   Ladene Artist, MD  Saccharomyces boulardii (PROBIOTIC) 250 MG CAPS Take by mouth.    [provider]  sitaGLIPtin (JANUVIA) 100 MG tablet Take 1 tablet (100 mg total) by mouth daily. 03/20/20   [name], NP  zinc gluconate 50 MG tablet Take 50 mg by mouth daily.    [provider]    Allergies    Crestor [rosuvastatin calcium] and Metformin and related  Review of Systems   Review of Systems All other systems reviewed and are negative except that which was mentioned in HPI  Physical Exam Updated Vital Signs BP (!) 149/71 (BP Location: Right Arm)   Pulse 80   Temp 98 F (36.7 C) (Oral)   Resp 16   Ht 5\' 7"  (1.702 m)   Wt 108.9 kg   SpO2 99%   BMI 37.59 kg/m   Physical Exam Vitals and nursing note reviewed.  Constitutional:      General: She is not in acute distress.    Appearance: She is well-developed and well-nourished.  HENT:     Head: Atraumatic.     Comments: Mild prominence of R forehead compared to L, above R eyebrow, tender to palpation but without induration or fluctuance, no tenderness of scalp or face;  no facial swelling/asymmetry; no auricular or occipital lymphadenopathy    Nose: Nose normal.  Eyes:  Conjunctiva/sclera: Conjunctivae normal.     Pupils: Pupils are equal, round, and reactive to light.  Musculoskeletal:     Cervical back: Neck supple.  Lymphadenopathy:     Cervical: No cervical adenopathy.  Skin:    General: Skin is warm and dry.     Findings: No erythema or rash.  Neurological:     Mental Status: She is alert and oriented to person, place, and time.  Psychiatric:        Mood and Affect: Mood and affect normal.        Judgment: Judgment normal.     ED Results / Procedures / Treatments   Labs (all labs ordered are listed, but only abnormal results are displayed) Labs Reviewed - No data to display  EKG None  Radiology CT Head Wo Contrast  Result Date: 05/21/2020 CLINICAL DATA:  Headache.  Right forehead swelling. EXAM: CT HEAD WITHOUT CONTRAST CT MAXILLOFACIAL WITHOUT CONTRAST TECHNIQUE: Multidetector CT imaging of the head and maxillofacial structures were performed using the standard protocol without intravenous contrast. Multiplanar CT image reconstructions of the maxillofacial structures were also generated. COMPARISON:  None. FINDINGS: CT HEAD FINDINGS Brain: No evidence of acute large vascular territory infarction, hemorrhage, hydrocephalus, extra-axial collection or mass lesion/mass effect. Vascular: Mild calcific atherosclerosis. No hyperdense vessel identified. Skull: No acute fracture. Other: No mastoid effusions. CT MAXILLOFACIAL FINDINGS Osseous: No fracture or mandibular dislocation. No destructive process. Orbits: Negative. No traumatic or inflammatory finding. Sinuses: Minimal scattered paranasal sinus mucosal thickening. No air-fluid levels. Soft tissues: Very mild right preseptal periorbital soft tissue stranding. No fluid collection. No postseptal stranding. IMPRESSION: 1. No evidence of acute intracranial abnormality. 2. Very mild, nonspecific  right preseptal periorbital soft tissue stranding. No fluid collection or postseptal abnormality. Electronically Signed   By: Margaretha Sheffield MD   On: 05/21/2020 10:05   CT Maxillofacial WO CM  Result Date: 05/21/2020 CLINICAL DATA:  Headache.  Right forehead swelling. EXAM: CT HEAD WITHOUT CONTRAST CT MAXILLOFACIAL WITHOUT CONTRAST TECHNIQUE: Multidetector CT imaging of the head and maxillofacial structures were performed using the standard protocol without intravenous contrast. Multiplanar CT image reconstructions of the maxillofacial structures were also generated. COMPARISON:  None. FINDINGS: CT HEAD FINDINGS Brain: No evidence of acute large vascular territory infarction, hemorrhage, hydrocephalus, extra-axial collection or mass lesion/mass effect. Vascular: Mild calcific atherosclerosis. No hyperdense vessel identified. Skull: No acute fracture. Other: No mastoid effusions. CT MAXILLOFACIAL FINDINGS Osseous: No fracture or mandibular dislocation. No destructive process. Orbits: Negative. No traumatic or inflammatory finding. Sinuses: Minimal scattered paranasal sinus mucosal thickening. No air-fluid levels. Soft tissues: Very mild right preseptal periorbital soft tissue stranding. No fluid collection. No postseptal stranding. IMPRESSION: 1. No evidence of acute intracranial abnormality. 2. Very mild, nonspecific right preseptal periorbital soft tissue stranding. No fluid collection or postseptal abnormality. Electronically Signed   By: Margaretha Sheffield MD   On: 05/21/2020 10:05    Procedures Procedures (including critical care time)  Medications Ordered in ED Medications - No data to display  ED Course  I have reviewed the triage vital signs and the nursing notes.  Pertinent imaging results that were available during my care of the patient were reviewed by me and considered in my medical decision making (see chart for details).    MDM Rules/Calculators/A&P                          No  trauma, skin changes to suggest abscess or shingles. Obtained CT  to evaluate for mass. Imaging notable only for trace swelling R preseptal area which is likely due to gravity; patient without eye complaints or signs/sx of periorbital cellulitis. On further questioning, it sounds like sx have been intermittent for longer, likely a few months. Doubt acute life threatening process such as infection. Provided w/ ENT f/u if swelling is persistent and progressive on forehead. Reviewed return precautions. Final Clinical Impression(s) / ED Diagnoses Final diagnoses:  Forehead pain    Rx / DC Orders ED Discharge Orders    None       Tamber Burtch, Wenda Overland, MD 05/21/20 1620

## 2020-05-21 NOTE — ED Triage Notes (Signed)
Pt arrives pov with c/o right side facial pain and swelling x 3 weeks that pt reports as worsening at night. Pt denies dizziness or confusion, denies n/v

## 2020-05-31 DIAGNOSIS — E119 Type 2 diabetes mellitus without complications: Secondary | ICD-10-CM | POA: Diagnosis not present

## 2020-05-31 DIAGNOSIS — Z7984 Long term (current) use of oral hypoglycemic drugs: Secondary | ICD-10-CM | POA: Diagnosis not present

## 2020-05-31 DIAGNOSIS — H40003 Preglaucoma, unspecified, bilateral: Secondary | ICD-10-CM | POA: Diagnosis not present

## 2020-05-31 DIAGNOSIS — H26493 Other secondary cataract, bilateral: Secondary | ICD-10-CM | POA: Diagnosis not present

## 2020-06-05 ENCOUNTER — Ambulatory Visit (HOSPITAL_BASED_OUTPATIENT_CLINIC_OR_DEPARTMENT_OTHER)
Admission: RE | Admit: 2020-06-05 | Discharge: 2020-06-05 | Disposition: A | Discharge status: Home / Self Care | Payer: BC Managed Care – PPO | Source: Ambulatory Visit | Attending: Family | Admitting: Family

## 2020-06-05 ENCOUNTER — Other Ambulatory Visit: Payer: Self-pay

## 2020-06-05 ENCOUNTER — Encounter: Payer: Self-pay | Admitting: Family

## 2020-06-05 ENCOUNTER — Ambulatory Visit: Payer: BC Managed Care – PPO | Admitting: Family

## 2020-06-05 VITALS — BP 124/78 | HR 81 | Temp 97.8°F | Resp 16 | Ht 68.0 in | Wt 236.0 lb

## 2020-06-05 DIAGNOSIS — R0989 Other specified symptoms and signs involving the circulatory and respiratory systems: Secondary | ICD-10-CM

## 2020-06-05 DIAGNOSIS — R5383 Other fatigue: Secondary | ICD-10-CM

## 2020-06-05 DIAGNOSIS — M797 Fibromyalgia: Secondary | ICD-10-CM

## 2020-06-05 DIAGNOSIS — I10 Essential (primary) hypertension: Secondary | ICD-10-CM

## 2020-06-05 DIAGNOSIS — R229 Localized swelling, mass and lump, unspecified: Secondary | ICD-10-CM | POA: Diagnosis not present

## 2020-06-05 DIAGNOSIS — J40 Bronchitis, not specified as acute or chronic: Secondary | ICD-10-CM

## 2020-06-05 DIAGNOSIS — E785 Hyperlipidemia, unspecified: Secondary | ICD-10-CM

## 2020-06-05 DIAGNOSIS — R059 Cough, unspecified: Secondary | ICD-10-CM | POA: Diagnosis not present

## 2020-06-05 LAB — COMPREHENSIVE METABOLIC PANEL
ALT: 20 U/L (ref 0–35)
AST: 9 U/L (ref 0–37)
Albumin: 4.3 g/dL (ref 3.5–5.2)
Alkaline Phosphatase: 60 U/L (ref 39–117)
BUN: 10 mg/dL (ref 6–23)
CO2: 31 mEq/L (ref 19–32)
Calcium: 9.4 mg/dL (ref 8.4–10.5)
Chloride: 100 mEq/L (ref 96–112)
Creatinine, Ser: 0.79 mg/dL (ref 0.40–1.20)
GFR: 78.44 mL/min (ref >60.00)
Glucose, Bld: 148 mg/dL — ABNORMAL HIGH (ref 70–99)
Potassium: 3.9 mEq/L (ref 3.5–5.1)
Sodium: 139 mEq/L (ref 135–145)
Total Bilirubin: 0.4 mg/dL (ref 0.2–1.2)
Total Protein: 7.2 g/dL (ref 6.0–8.3)

## 2020-06-05 LAB — TSH: TSH: 0.57 u[IU]/mL (ref 0.35–4.50)

## 2020-06-05 LAB — CBC WITH DIFFERENTIAL/PLATELET
Basophils Absolute: 0 10*3/uL (ref 0.0–0.1)
Basophils Relative: 0.5 % (ref 0.0–3.0)
Eosinophils Absolute: 0.1 10*3/uL (ref 0.0–0.7)
Eosinophils Relative: 1.4 % (ref 0.0–5.0)
HCT: 38.8 % (ref 36.0–46.0)
Hemoglobin: 13.7 g/dL (ref 12.0–15.0)
Lymphocytes Relative: 38.7 % (ref 12.0–46.0)
Lymphs Abs: 2.2 10*3/uL (ref 0.7–4.0)
MCHC: 35.3 g/dL (ref 30.0–36.0)
MCV: 85.7 fl (ref 78.0–100.0)
Monocytes Absolute: 0.6 10*3/uL (ref 0.1–1.0)
Monocytes Relative: 10 % (ref 3.0–12.0)
Neutro Abs: 2.8 10*3/uL (ref 1.4–7.7)
Neutrophils Relative %: 49.4 % (ref 43.0–77.0)
Platelets: 223 10*3/uL (ref 150.0–400.0)
RBC: 4.52 Mil/uL (ref 3.87–5.11)
RDW: 15.5 % (ref 11.5–15.5)
WBC: 5.7 10*3/uL (ref 4.0–10.5)

## 2020-06-05 LAB — HEMOGLOBIN A1C: Hgb A1c MFr Bld: 6.6 % — ABNORMAL HIGH (ref 4.6–6.5)

## 2020-06-05 MED ORDER — AZITHROMYCIN 250 MG PO TABS: ORAL_TABLET | ORAL | 0 refills | Status: DC

## 2020-06-05 NOTE — Patient Instructions (Signed)
Please complete lab work prior to leaving. Complete x-ray on the first floor.  

## 2020-06-05 NOTE — Progress Notes (Incomplete)
Subjective:    Patient ID: [name], female    DOB: Oct 31, 1955, 65 y.o.   MRN: [id number]  HPI  Patient is a 65 yr old female who presents today for follow up.  Patient c/o mass on the right side of her forehead. She was seen in the ED for this on 12/19 and had a CT scan which noted:  1. No evidence of acute intracranial abnormality. 2. Very mild, nonspecific right preseptal periorbital soft tissue stranding. No fluid collection or postseptal abnormality  Also c/o fatigue. Fatigue began about 3 weeks ago. Reports that she has had chest congestion/short winded with laying down. Taking mucinex. Denies fever. She denies loss of taste or smell. She has had 3 moderna shots. Has intermittent mid back pain which is fairly chronic.  Wt Readings from Last 3 Encounters:  06/05/20 236 lb (107 kg)  05/21/20 240 lb (108.9 kg)  03/20/20 237 lb (107.5 kg)   Hyperlipidemia- last visit we restarted livalo.    o evidence of acute intracranial abnormality. 2. Very mild, nonspecific right preseptal periorbital soft tissue stranding. No fluid collection or postseptal abnormality  Fibromyalgia- last visit we started cymbalta. No improvement with fibromyalgia.    DM2- did not tolerate metformin. We instead started [place]. Not checking sugars regular.   Lab Results  Component Value Date   HGBA1C 7.3 (H) 02/18/2020   HGBA1C 6.7 (H) 11/05/2019   HGBA1C 6.2 05/25/2019   Lab Results  Component Value Date   MICROALBUR 1.4 05/25/2019   LDLCALC 148 (H) 10/13/2018   CREATININE 0.71 11/05/2019    Review of Systems See HPI  Past Medical History:  Diagnosis Date  . Arthritis   . Back pain   . Benign fundic gland polyps of stomach   . Constipation   . Diabetes mellitus without complication ([place])   . Esophagitis    LA Class B  . Fatigue   . Fatty liver   . Fatty liver   . Fibromyalgia   . Gallbladder problem   . GERD (gastroesophageal reflux disease)   . Heart murmur   . Hyperlipidemia    . Hypertension   . IBS (irritable bowel syndrome)   . Joint pain   . Neuromuscular disorder ([place])   . Prediabetes      Social History   Socioeconomic History  . Marital status: Single    Spouse name: Not on file  . Number of children: Not on file  . Years of education: Not on file  . Highest education level: Not on file  Occupational History  . 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 Smoking status: Former Smoker    Packs/day: 0.10    Years: 10.00    Pack years: 1.00    Types: Cigarettes    Quit date: 07/14/1981    Years since quitting: 38.9  . Smokeless tobacco: Never Used  Vaping Use  . Vaping Use: Never used  Substance and Sexual Activity  . Alcohol use: No  . Drug use: No  . Sexual activity: Not Currently  Other Topics Concern  . 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 CHOLECYSTECTOMY  2009   low GB EF, no gallstones  . COLONOSCOPY    . LAPAROSCOPY     of adhesions/ of ovaries  . UPPER GASTROINTESTINAL ENDOSCOPY    . VAGINAL HYSTERECTOMY      Family History  Problem Relation Age of Onset  . Colon cancer Father   . Hypertension Father   . Hyperlipidemia Father   . Obesity Father   . Heart disease Mother   . Hypertension Mother   . Hyperlipidemia Mother   . Obesity Mother   . Diabetes Brother        Half  . Heart disease Brother   .  Diabetes Sister   . Breast cancer Paternal Grandmother   . Esophageal cancer Neg Hx   . Stomach cancer Neg Hx   . Rectal cancer Neg Hx     Allergies  Allergen Reactions  . Crestor [Rosuvastatin Calcium]     Muscle aches  . Metformin And Related     abd pain    Current Outpatient Medications on File Prior to Visit  Medication Sig Dispense Refill  . albuterol (VENTOLIN HFA) 108 (90 Base) MCG/ACT inhaler     . Ascorbic Acid (VITAMIN C) 100 MG tablet Take 100 mg by mouth daily.    Marland Kitchen azelastine (ASTELIN) 0.1 % nasal spray Place 1 spray into both nostrils 2 (two) times daily.    . cholecalciferol (VITAMIN D3) 25 MCG (1000 UNIT) tablet Take 1,000 Units by mouth daily.    . DULoxetine (CYMBALTA) 30 MG capsule Take 2 capsules (60 mg total) by mouth daily. 60 capsule 0  . fexofenadine (ALLEGRA) 60 MG tablet Take 60 mg by mouth 2 (two) times daily.    . hydrochlorothiazide (HYDRODIURIL) 25 MG tablet Take 1 tablet (25 mg total) by mouth daily. 90 tablet 1  . lidocaine (LIDODERM) 5 % Place 1 patch onto the skin daily. Remove & Discard patch within 12 hours or as directed by MD. 90 patch 1  . meloxicam (MOBIC) 7.5 MG tablet Take 1 tablet (7.5 mg total) by mouth daily as needed for pain. 30 tablet 0  . Multiple Vitamin (MULTIVITAMIN WITH MINERALS) TABS tablet Take 1 tablet by mouth daily.    . pantoprazole (PROTONIX) 40 MG tablet Take 1 tablet (40 mg total) by mouth 2 (two) times daily. 60 tablet 11  . Pitavastatin Calcium (LIVALO) 4 MG TABS Take 1 tablet (4 mg total) by mouth daily. 90 tablet 1  . Prucalopride Succinate (MOTEGRITY) 2 MG TABS Take 1 tablet (2 mg total) by mouth daily. 30 tablet 11  . Saccharomyces boulardii (PROBIOTIC) 250 MG CAPS Take by mouth.    . sitaGLIPtin (JANUVIA) 100 MG tablet Take 1 tablet (100 mg total) by mouth daily. 30 tablet 5  . zinc gluconate 50 MG tablet Take 50 mg by mouth daily.     No current facility-administered medications on file prior to visit.    BP  124/78 (BP Location: Left Arm, Patient Position: Sitting, Cuff Size: Large)   Pulse 81   Temp 97.8 F (36.6 C) (Temporal)   Resp 16   Ht 5\' 8"  (1.727 m)   Wt 236 lb (107  kg)   SpO2 97%   BMI 35.88 kg/m       Objective:   Physical Exam        Assessment & Plan:

## 2020-06-06 DIAGNOSIS — Z1231 Encounter for screening mammogram for malignant neoplasm of breast: Secondary | ICD-10-CM | POA: Diagnosis not present

## 2020-06-06 DIAGNOSIS — Z01419 Encounter for gynecological examination (general) (routine) without abnormal findings: Secondary | ICD-10-CM | POA: Diagnosis not present

## 2020-06-06 DIAGNOSIS — Z6835 Body mass index (BMI) 35.0-35.9, adult: Secondary | ICD-10-CM | POA: Diagnosis not present

## 2020-06-08 NOTE — Progress Notes (Signed)
Subjective:    Patient ID: [name], female    DOB: 07-Mar-1956, 65 y.o.   MRN: [id number]  HPI  Subjective  Patient ID: [name], female DOB: Feb 01, 1956, 65 y.o. MRN: [id number]  HPI   Patient is a 65 yr old female who presents today for follow up.   Patient c/o mass on the right side of her forehead. She does not remember any trauma to her forehead.She was seen in the ED for this on 12/19 and had a CT scan which noted:   1. No evidence of acute intracranial abnormality.  2. Very mild, nonspecific right preseptal periorbital soft tissue stranding. No fluid collection or postseptal abnormality   Also c/o fatigue. Fatigue began about 3 weeks ago. Reports that she has had chest congestion/and feels short winded with laying down. Taking mucinex. Denies associated fever. She denies loss of taste or smell. She has had 3 moderna shots. Has intermittent mid back pain which is fairly chronic.     Wt Readings from Last 3 Encounters:  06/05/20 236 lb (107 kg)  05/21/20 240 lb (108.9 kg)  03/20/20 237 lb (107.5 kg)   Hyperlipidemia- last visit we restarted livalo.    Fibromyalgia- last visit we started cymbalta. No improvement with fibromyalgia.   DM2- did not tolerate metformin. We instead started [place]. Not checking sugars regular.  Recent Labs       Lab Results  Component Value Date   HGBA1C 7.3 (H) 02/18/2020   HGBA1C 6.7 (H) 11/05/2019   HGBA1C 6.2 05/25/2019   Recent Labs       Lab Results  Component Value Date   MICROALBUR 1.4 05/25/2019   LDLCALC 148 (H) 10/13/2018   CREATININE 0.71 11/05/2019  Review of Systems  See HPI      Past Medical History:  Diagnosis Date  . Arthritis   . Back pain   . Benign fundic gland polyps of stomach   . Constipation   . Diabetes mellitus without complication ([place])   . Esophagitis    LA Class B  . Fatigue   . Fatty liver   . Fatty liver   . Fibromyalgia   . Gallbladder problem   . GERD (gastroesophageal reflux  disease)   . Heart murmur   . Hyperlipidemia   . Hypertension   . IBS (irritable bowel syndrome)   . Joint pain   . Neuromuscular disorder ([place])   . Prediabetes    Social History        Socioeconomic History  . Marital status: Single    Spouse name: Not on file  . Number of children: Not on file  . Years of education: Not on file  . Highest education level: Not on file  Occupational History  . 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 Smoking status: Former Smoker    Packs/day: 0.10    Years: 10.00    Pack years: 1.00    Types: Cigarettes    Quit date: 07/14/1981    Years since quitting: 38.9  . Smokeless tobacco: Never Used  Vaping Use  . Vaping Use: Never used  Substance and Sexual Activity  . Alcohol use: No  . Drug use: No  . Sexual activity: Not Currently  Other Topics Concern  . 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 CHOLECYSTECTOMY  2009   low GB EF, no gallstones  . COLONOSCOPY    . LAPAROSCOPY     of adhesions/ of ovaries  . UPPER GASTROINTESTINAL ENDOSCOPY    . VAGINAL HYSTERECTOMY          Family History  Problem Relation Age of Onset  . Colon cancer Father   . Hypertension Father   . Hyperlipidemia Father   . Obesity Father   . Heart disease Mother   . Hypertension Mother   . Hyperlipidemia Mother   . Obesity Mother   . Diabetes  Brother    Half  . Heart disease Brother   . Diabetes Sister   . Breast cancer Paternal Grandmother   . Esophageal cancer Neg Hx   . Stomach cancer Neg Hx   . Rectal cancer Neg Hx         Allergies  Allergen Reactions  . Crestor [Rosuvastatin Calcium]     Muscle aches  . Metformin And Related     abd pain         Current Outpatient Medications on File Prior to Visit  Medication Sig Dispense Refill  . albuterol (VENTOLIN HFA) 108 (90 Base) MCG/ACT inhaler     . Ascorbic Acid (VITAMIN C) 100 MG tablet Take 100 mg by mouth daily.    Marland Kitchen azelastine (ASTELIN) 0.1 % nasal spray Place 1 spray into both nostrils 2 (two) times daily.    . cholecalciferol (VITAMIN D3) 25 MCG (1000 UNIT) tablet Take 1,000 Units by mouth daily.    . DULoxetine (CYMBALTA) 30 MG capsule Take 2 capsules (60 mg total) by mouth daily. 60 capsule 0  . fexofenadine (ALLEGRA) 60 MG tablet Take 60 mg by mouth 2 (two) times daily.    . hydrochlorothiazide (HYDRODIURIL) 25 MG tablet Take 1 tablet (25 mg total) by mouth daily. 90 tablet 1  . lidocaine (LIDODERM) 5 % Place 1 patch onto the skin daily. Remove & Discard patch within 12 hours or as directed by MD. 90 patch 1  . meloxicam (MOBIC) 7.5 MG tablet Take 1 tablet (7.5 mg total) by mouth daily as needed for pain. 30 tablet 0  . Multiple Vitamin (MULTIVITAMIN WITH MINERALS) TABS tablet Take 1 tablet by mouth daily.    . pantoprazole (PROTONIX) 40 MG tablet Take 1 tablet (40 mg total) by mouth 2 (two) times daily. 60 tablet 11  . Pitavastatin Calcium (LIVALO) 4 MG TABS Take 1 tablet (4 mg total) by mouth daily. 90 tablet 1  . Prucalopride Succinate (MOTEGRITY) 2 MG TABS Take 1 tablet (2 mg total) by mouth daily. 30 tablet 11  . Saccharomyces boulardii (PROBIOTIC) 250 MG CAPS Take by mouth.    . sitaGLIPtin (JANUVIA) 100 MG tablet Take 1 tablet (100 mg total) by mouth daily. 30 tablet 5  . zinc gluconate 50 MG tablet Take 50 mg by mouth daily.     No current  facility-administered medications on file  prior to visit.   BP 124/78 (BP Location: Left Arm, Patient Position: Sitting, Cuff Size: Large)  Pulse 81  Temp 97.8 F (36.6 C) (Temporal)  Resp 16  Ht 5\' 8"  (1.727 m)  Wt 236 lb (107 kg)  SpO2 97%  BMI 35.88 kg/m     Review of Systems    see HPI Objective:   Physical Exam Constitutional:      Appearance: She is well-developed and well-nourished.  HENT:     Head:     Comments: Very mild soft tissue swelling noted on right side of forehead. No erythema or tenderness to palpation Cardiovascular:     Rate and Rhythm: Normal rate and regular rhythm.     Heart sounds: Normal heart sounds. No murmur heard.   Pulmonary:     Effort: Pulmonary effort is normal. No respiratory distress.     Breath sounds: Normal breath sounds. No wheezing.  Psychiatric:        Mood and Affect: Mood and affect normal.        Behavior: Behavior normal.        Thought Content: Thought content normal.        Judgment: Judgment normal.           Assessment & Plan:  Soft tissue swelling- etiology unclear. No worrisome findings on CT. No clinical sign of infection. Monitor.    Bronchitis- will plan rx with Zpak. See orders.   HTN- bp stable. Continue hctz 25mg  once daily.  DM2- control uncertain. Obtain A1C. Continue Januvia and diabetic diet.  Fibromyalgia- no significant improvement with cymbalta. Consider discontinuation next visit.  Hyperlipidemia- tolerating livalo- continue 4mg  once daily.  This visit occurred during the SARS-CoV-2 public health emergency.  Safety protocols were in place, including screening questions prior to the visit, additional usage of staff PPE, and extensive cleaning of exam room while observing appropriate contact time as indicated for disinfecting solutions.

## 2020-06-09 ENCOUNTER — Telehealth: Payer: Self-pay | Admitting: *Deleted

## 2020-06-09 ENCOUNTER — Other Ambulatory Visit: Payer: BC Managed Care – PPO

## 2020-06-09 DIAGNOSIS — E785 Hyperlipidemia, unspecified: Secondary | ICD-10-CM | POA: Diagnosis not present

## 2020-06-09 NOTE — Telephone Encounter (Signed)
Placed future order for lipid panel with [facility] and faxed add on request to [facility] lab to release order/specimen.

## 2020-06-09 NOTE — Telephone Encounter (Signed)
-----   Message from [name], NP sent at 06/08/2020  2:04 PM EST ----- Hi, any chance we can add on lipid panel dx hyperlipidemia?

## 2020-06-10 LAB — LIPID PANEL
Cholesterol: 318 mg/dL — ABNORMAL HIGH (ref <200)
HDL: 46 mg/dL — ABNORMAL LOW (ref >=50)
LDL Cholesterol (Calc): 224 mg/dL (calc) — ABNORMAL HIGH
Non-HDL Cholesterol (Calc): 272 mg/dL (calc) — ABNORMAL HIGH (ref <130)
Total CHOL/HDL Ratio: 6.9 (calc) — ABNORMAL HIGH (ref <5.0)
Triglycerides: 270 mg/dL — ABNORMAL HIGH (ref <150)

## 2020-06-11 ENCOUNTER — Telehealth: Payer: Self-pay | Admitting: Family

## 2020-06-11 NOTE — Telephone Encounter (Signed)
Please contact pt and let her know that her cholesterol is super high.  It doesn't look like she has been taking the livalo- has she missed any doses? If so, please restart. Continue to work on low fat/low cholesterol diet.

## 2020-06-12 NOTE — Telephone Encounter (Signed)
Patient advised of results, she reports she has not been taking the medication. She was advised to re start medication along with low cholesterol diet, she verbalized understanding.

## 2020-06-14 ENCOUNTER — Other Ambulatory Visit: Payer: Self-pay | Admitting: Family

## 2020-06-21 ENCOUNTER — Encounter: Payer: Self-pay | Admitting: Family

## 2020-06-21 ENCOUNTER — Other Ambulatory Visit: Payer: Self-pay

## 2020-06-21 ENCOUNTER — Ambulatory Visit: Payer: BC Managed Care – PPO | Admitting: Family

## 2020-06-21 VITALS — BP 127/63 | HR 74 | Temp 98.7°F | Resp 16 | Ht 68.0 in | Wt 238.0 lb

## 2020-06-21 DIAGNOSIS — J40 Bronchitis, not specified as acute or chronic: Secondary | ICD-10-CM

## 2020-06-21 NOTE — Progress Notes (Signed)
Subjective:    Patient ID: [name], female    DOB: Sep 20, 1955, 65 y.o.   MRN: [id number]  HPI  Patient is a 65 yr old female who presents today for follow up. Last visit we treated her for bronchitis with azithromycin.  Denies SOB.  She still having some mucous production. Reports that it is better than it was 2 weeks ago. She is using mucinex daily.   Review of Systems See HPI  Past Medical History:  Diagnosis Date  . Arthritis   . Back pain   . Benign fundic gland polyps of stomach   . Constipation   . Diabetes mellitus without complication ([place])   . Esophagitis    LA Class B  . Fatigue   . Fatty liver   . Fatty liver   . Fibromyalgia   . Gallbladder problem   . GERD (gastroesophageal reflux disease)   . Heart murmur   . Hyperlipidemia   . Hypertension   . IBS (irritable bowel syndrome)   . Joint pain   . Neuromuscular disorder ([place])   . Prediabetes      Social History   Socioeconomic History  . Marital status: Single    Spouse name: Not on file  . Number of children: Not on file  . Years of education: Not on file  . Highest education level: Not on file  Occupational History  . 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 Smoking status: Former Smoker    Packs/day: 0.10    Years: 10.00    Pack years: 1.00    Types: Cigarettes    Quit date: 07/14/1981    Years since quitting: 38.9  . Smokeless tobacco: Never Used  Vaping Use  . Vaping Use: Never used  Substance and Sexual Activity  . Alcohol use: No  . Drug use: No  . Sexual activity: Not Currently  Other Topics Concern  . 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 CHOLECYSTECTOMY  2009   low GB EF, no gallstones  . COLONOSCOPY    . LAPAROSCOPY     of adhesions/ of ovaries  . UPPER GASTROINTESTINAL ENDOSCOPY    . VAGINAL HYSTERECTOMY      Family History  Problem Relation Age of Onset  . Colon cancer Father   . Hypertension Father   . Hyperlipidemia Father   . Obesity Father   . Heart disease Mother   . Hypertension Mother   . Hyperlipidemia Mother   . Obesity Mother   . Diabetes Brother        Half  . Heart disease Brother   . Diabetes Sister   . Breast cancer Paternal Grandmother   . Esophageal cancer Neg Hx   . Stomach cancer Neg Hx   . Rectal cancer Neg Hx  Allergies  Allergen Reactions  . Crestor [Rosuvastatin Calcium]     Muscle aches  . Metformin And Related     abd pain    Current Outpatient Medications on File Prior to Visit  Medication Sig Dispense Refill  . albuterol (VENTOLIN HFA) 108 (90 Base) MCG/ACT inhaler     . Ascorbic Acid (VITAMIN C) 100 MG tablet Take 100 mg by mouth daily.    Marland Kitchen azelastine (ASTELIN) 0.1 % nasal spray Place 1 spray into both nostrils 2 (two) times daily.    . cholecalciferol (VITAMIN D3) 25 MCG (1000 UNIT) tablet Take 1,000 Units by mouth daily.    . DULoxetine (CYMBALTA) 30 MG capsule TAKE 2 CAPSULES(60 MG) BY MOUTH DAILY 60 capsule 0  . fexofenadine (ALLEGRA) 60 MG tablet Take 60 mg by mouth 2 (two) times daily.    . hydrochlorothiazide (HYDRODIURIL) 25 MG tablet Take 1 tablet (25 mg total) by mouth daily. 90 tablet 1  . lidocaine (LIDODERM) 5 % Place 1 patch onto the skin daily. Remove & Discard patch within 12 hours or as directed by MD. 90 patch 1  . meloxicam (MOBIC) 7.5 MG tablet TAKE 1 TABLET(7.5 MG) BY  MOUTH DAILY AS NEEDED FOR PAIN 30 tablet 0  . Multiple Vitamin (MULTIVITAMIN WITH MINERALS) TABS tablet Take 1 tablet by mouth daily.    . pantoprazole (PROTONIX) 40 MG tablet Take 1 tablet (40 mg total) by mouth 2 (two) times daily. 60 tablet 11  . Pitavastatin Calcium (LIVALO) 4 MG TABS Take 1 tablet (4 mg total) by mouth daily. 90 tablet 1  . Prucalopride Succinate (MOTEGRITY) 2 MG TABS Take 1 tablet (2 mg total) by mouth daily. 30 tablet 11  . Saccharomyces boulardii (PROBIOTIC) 250 MG CAPS Take by mouth.    . sitaGLIPtin (JANUVIA) 100 MG tablet Take 1 tablet (100 mg total) by mouth daily. 30 tablet 5  . zinc gluconate 50 MG tablet Take 50 mg by mouth daily.     No current facility-administered medications on file prior to visit.    BP 127/63 (BP Location: Right Arm, Patient Position: Sitting, Cuff Size: Large)   Pulse 74   Temp 98.7 F (37.1 C) (Oral)   Resp 16   Ht 5\' 8"  (1.727 m)   Wt 238 lb (108 kg)   SpO2 100%   BMI 36.19 kg/m       Objective:   Physical Exam Constitutional:      Appearance: She is well-developed and well-nourished.  Cardiovascular:     Rate and Rhythm: Normal rate and regular rhythm.     Heart sounds: Normal heart sounds. No murmur heard.   Pulmonary:     Effort: Pulmonary effort is normal. No respiratory distress.     Breath sounds: Normal breath sounds. No wheezing.  Psychiatric:        Mood and Affect: Mood and affect normal.        Behavior: Behavior normal.        Thought Content: Thought content normal.        Judgment: Judgment normal.           Assessment & Plan:  Bronchitis- lung exam is clear, oxygen 100%, overall symptoms are improving. Pt is advised to let me know if her symptoms do not continue to improve.  This visit occurred during the SARS-CoV-2 public health emergency.  Safety protocols were in place, including screening questions prior to the visit, additional usage of staff PPE, and extensive cleaning of  exam room while  observing appropriate contact time as indicated for disinfecting solutions.

## 2020-07-13 ENCOUNTER — Other Ambulatory Visit: Payer: Self-pay | Admitting: Family

## 2020-07-14 DIAGNOSIS — M170 Bilateral primary osteoarthritis of knee: Secondary | ICD-10-CM | POA: Diagnosis not present

## 2020-07-14 DIAGNOSIS — M1711 Unilateral primary osteoarthritis, right knee: Secondary | ICD-10-CM | POA: Diagnosis not present

## 2020-07-21 ENCOUNTER — Other Ambulatory Visit: Payer: Self-pay | Admitting: Gastroenterology

## 2020-07-28 DIAGNOSIS — H26493 Other secondary cataract, bilateral: Secondary | ICD-10-CM | POA: Diagnosis not present

## 2020-07-28 DIAGNOSIS — Z961 Presence of intraocular lens: Secondary | ICD-10-CM | POA: Diagnosis not present

## 2020-08-01 DIAGNOSIS — H26492 Other secondary cataract, left eye: Secondary | ICD-10-CM | POA: Diagnosis not present

## 2020-08-02 ENCOUNTER — Ambulatory Visit: Payer: BC Managed Care – PPO | Admitting: Gastroenterology

## 2020-08-08 DIAGNOSIS — H26491 Other secondary cataract, right eye: Secondary | ICD-10-CM | POA: Diagnosis not present

## 2020-08-21 ENCOUNTER — Encounter: Payer: Self-pay | Admitting: Family

## 2020-08-21 MED ORDER — BENZONATATE 100 MG PO CAPS: 100.0000 mg | ORAL_CAPSULE | Freq: Three times a day (TID) | ORAL | 0 refills | Status: DC | PRN

## 2020-08-23 ENCOUNTER — Ambulatory Visit: Payer: BC Managed Care – PPO | Admitting: Family

## 2020-08-23 ENCOUNTER — Other Ambulatory Visit: Payer: Self-pay

## 2020-08-23 DIAGNOSIS — N393 Stress incontinence (female) (male): Secondary | ICD-10-CM | POA: Diagnosis not present

## 2020-08-23 DIAGNOSIS — R059 Cough, unspecified: Secondary | ICD-10-CM

## 2020-08-23 NOTE — Assessment & Plan Note (Signed)
Hopefully this will improve now that her cough is improved. Monitor.

## 2020-08-23 NOTE — Patient Instructions (Signed)
Please continue zyrtec 10mg  once daily. Add flonase 2 sprays each nostril once daily. Call if your symptoms worsen or if they do not continue to improve.

## 2020-08-23 NOTE — Assessment & Plan Note (Signed)
I suspect that this is due to worsening seasonal allergies. Reports resolution of cough today. I advised to continue zyrtec 10mg  once daily and add flonase 2 sprays each nostril once daily. She will let me know if her symptoms return/worsen.

## 2020-08-23 NOTE — Progress Notes (Signed)
Subjective:   By signing my name below, I, Lucille Passy, attest that this documentation has been prepared under the direction and in the presence of [name]. 08/23/2020   Patient ID: [name], female    DOB: 1955-11-03, 65 y.o.   MRN: [id number]  Chief Complaint  Patient presents with  . Cough    Complains of persistent cough.   . Urinary Incontinence    Complains of stress incontinence, mostly with cough    HPI Patient is in today for office visit to discuss cough. She was treated for bronchitis back in early January. She reports that cough resolved until 3/17 when she developed a new cough. This cough seems deep in her chest. She endorses having a nasal drip, but denies any nausea or vomiting. She relates theses symptoms to seasonal allergies. She was not able to take her temp but she felt warm at the time. She takes Zyrtec once daily to treat her symptoms but she is trying to clear up her mucus production. She notes that she is not currently using any nasal spray but she has in the past.   She has been struggling with stress incontinence with the cough. Typically this is not an issue for her though.    Past Medical History:  Diagnosis Date  . Arthritis   . Back pain   . Benign fundic gland polyps of stomach   . Constipation   . Diabetes mellitus without complication ([place])   . Esophagitis    LA Class B  . Fatigue   . Fatty liver   . Fatty liver   . Fibromyalgia   . Gallbladder problem   . GERD (gastroesophageal reflux disease)   . Heart murmur   . Hyperlipidemia   . Hypertension   . IBS (irritable bowel syndrome)   . Joint pain   . Neuromuscular disorder ([place])   . Prediabetes     Past Surgical History:  Procedure Laterality Date  . CHOLECYSTECTOMY  2009   low GB EF, no gallstones  . COLONOSCOPY    . LAPAROSCOPY     of adhesions/ of ovaries  . UPPER GASTROINTESTINAL ENDOSCOPY    . VAGINAL HYSTERECTOMY      Family History  Problem Relation  Age of Onset  . Colon cancer Father   . Hypertension Father   . Hyperlipidemia Father   . Obesity Father   . Heart disease Mother   . Hypertension Mother   . Hyperlipidemia Mother   . Obesity Mother   . Diabetes Brother        Half  . Heart disease Brother   . Diabetes Sister   . Breast cancer Paternal Grandmother   . Esophageal cancer Neg Hx   . Stomach cancer Neg Hx   . Rectal cancer Neg Hx     Social History   Socioeconomic History  . Marital status: Single    Spouse name: Not on file  . Number of children: Not on file  . Years of education: Not on file  . Highest education level: Not on file  Occupational History  . 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 Smoking status: Former Smoker    Packs/day: 0.10    Years: 10.00    Pack years: 1.00    Types: Cigarettes    Quit date: 07/14/1981    Years since quitting: 39.1  . Smokeless tobacco: Never Used  Vaping Use  . Vaping Use: Never used  Substance and Sexual Activity  . Alcohol use: No  . Drug use: No  . Sexual activity: Not Currently  Other Topics Concern  . 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 albuterol (VENTOLIN HFA) 108 (90 Base) MCG/ACT inhaler     . Ascorbic Acid (VITAMIN C) 100 MG tablet Take 100 mg by mouth daily.    Marland Kitchen azelastine (ASTELIN) 0.1 % nasal spray Place 1 spray  into both nostrils 2 (two) times daily.    . cholecalciferol (VITAMIN D3) 25 MCG (1000 UNIT) tablet Take 1,000 Units by mouth daily.    . DULoxetine (CYMBALTA) 30 MG capsule TAKE 2 CAPSULES(60 MG) BY MOUTH DAILY 60 capsule 0  . fexofenadine (ALLEGRA) 60 MG tablet Take 60 mg by mouth 2 (two) times daily.    . hydrochlorothiazide (HYDRODIURIL) 25 MG tablet Take 1 tablet (25 mg total) by mouth daily. 90 tablet 1  . lidocaine (LIDODERM) 5 % Place 1 patch onto the skin daily. Remove & Discard patch within 12 hours or as directed by MD. 90 patch 1  . meloxicam (MOBIC) 7.5 MG tablet TAKE 1 TABLET(7.5 MG) BY MOUTH DAILY AS NEEDED FOR PAIN 30 tablet 0  . Multiple Vitamin (MULTIVITAMIN WITH MINERALS) TABS tablet Take 1 tablet by mouth daily.    . pantoprazole (PROTONIX) 40 MG tablet Take 1 tablet (40 mg total) by mouth 2 (two) times daily. 60 tablet 11  . Pitavastatin Calcium (LIVALO) 4 MG TABS Take 1 tablet (4 mg total) by mouth daily. 90 tablet 1  . Prucalopride Succinate (MOTEGRITY) 2 MG TABS Take 1 tablet (2 mg total) by mouth daily. 30 tablet 11  . Saccharomyces boulardii (PROBIOTIC) 250 MG CAPS Take by mouth.    . sitaGLIPtin (JANUVIA) 100 MG tablet Take 1 tablet (100 mg total) by mouth daily. 30 tablet 5  . zinc gluconate 50 MG tablet Take 50 mg by mouth daily.    . benzonatate (TESSALON) 100 MG capsule Take 1 capsule (100 mg total) by mouth 3 (three) times daily as needed. (Patient not taking: Reported on 08/23/2020) 20 capsule 0   No facility-administered medications prior to visit.    Allergies  Allergen Reactions  . Crestor [Rosuvastatin Calcium]     Muscle aches  . Metformin And Related     abd pain    Review of Systems  HENT: Positive for congestion (nasal). Negative for ear pain, sinus pain and sore throat.   Respiratory: Positive for cough. Negative for shortness of breath.   Cardiovascular: Negative for leg swelling.       Objective:    Physical Exam Constitutional:       General: She is not in acute distress.    Appearance: Normal appearance. She is not ill-appearing.  HENT:     Head: Normocephalic and atraumatic.  Comments: There is bilateral ceruminosis occluding view of TM's.     Right Ear: External ear normal.     Left Ear: External ear normal.     Mouth/Throat:     Comments: Mild erythema of posterior oropharynx Eyes:     Extraocular Movements: Extraocular movements intact.     Pupils: Pupils are equal, round, and reactive to light.  Neck:     Comments: No JVD, no lymphadenopathy Cardiovascular:     Rate and Rhythm: Normal rate and regular rhythm.     Pulses: Normal pulses.     Heart sounds: Normal heart sounds. No murmur heard.   Pulmonary:     Effort: Pulmonary effort is normal. No respiratory distress.     Breath sounds: Normal breath sounds. No wheezing, rhonchi or rales.  Skin:    General: Skin is warm and dry.  Neurological:     Mental Status: She is alert and oriented to person, place, and time.  Psychiatric:        Behavior: Behavior normal.     BP 123/67 (BP Location: Right Arm, Patient Position: Sitting, Cuff Size: Large)   Pulse 71   Temp 98.4 F (36.9 C) (Oral)   Resp 16   Ht 5\' 8"  (1.727 m)   Wt 234 lb (106.1 kg)   SpO2 100%   BMI 35.58 kg/m  Wt Readings from Last 3 Encounters:  08/23/20 234 lb (106.1 kg)  06/21/20 238 lb (108 kg)  06/05/20 236 lb (107 kg)    Diabetic Foot Exam - Simple   No data filed    Lab Results  Component Value Date   WBC 5.7 06/05/2020   HGB 13.7 06/05/2020   HCT 38.8 06/05/2020   PLT 223.0 06/05/2020   GLUCOSE 148 (H) 06/05/2020   CHOL 318 (H) 06/09/2020   TRIG 270 (H) 06/09/2020   HDL 46 (L) 06/09/2020   LDLDIRECT 174.0 05/25/2019   LDLCALC 224 (H) 06/09/2020   ALT 20 06/05/2020   AST 9 06/05/2020   NA 139 06/05/2020   K 3.9 06/05/2020   CL 100 06/05/2020   CREATININE 0.79 06/05/2020   BUN 10 06/05/2020   CO2 31 06/05/2020   TSH 0.57 06/05/2020   HGBA1C 6.6 (H)  06/05/2020   MICROALBUR 1.4 05/25/2019    Lab Results  Component Value Date   TSH 0.57 06/05/2020   Lab Results  Component Value Date   WBC 5.7 06/05/2020   HGB 13.7 06/05/2020   HCT 38.8 06/05/2020   MCV 85.7 06/05/2020   PLT 223.0 06/05/2020   Lab Results  Component Value Date   NA 139 06/05/2020   K 3.9 06/05/2020   CO2 31 06/05/2020   GLUCOSE 148 (H) 06/05/2020   BUN 10 06/05/2020   CREATININE 0.79 06/05/2020   BILITOT 0.4 06/05/2020   ALKPHOS 60 06/05/2020   AST 9 06/05/2020   ALT 20 06/05/2020   PROT 7.2 06/05/2020   ALBUMIN 4.3 06/05/2020   CALCIUM 9.4 06/05/2020   GFR 78.44 06/05/2020   Lab Results  Component Value Date   CHOL 318 (H) 06/09/2020   Lab Results  Component Value Date   HDL 46 (L) 06/09/2020   Lab Results  Component Value Date   LDLCALC 224 (H) 06/09/2020   Lab Results  Component Value Date   TRIG 270 (H) 06/09/2020   Lab Results  Component Value Date   CHOLHDL 6.9 (H) 06/09/2020   Lab Results  Component Value Date   HGBA1C 6.6 (  H) 06/05/2020       Assessment & Plan:   Problem List Items Addressed This Visit      Unprioritized   Stress incontinence    Hopefully this will improve now that her cough is improved. Monitor.       Cough    I suspect that this is due to worsening seasonal allergies. Reports resolution of cough today. I advised to continue zyrtec 10mg  once daily and add flonase 2 sprays each nostril once daily. She will let me know if her symptoms return/worsen.         No orders of the defined types were placed in this encounter.   I, Nance Pear, NP, personally preformed the services described in this documentation.  All medical record entries made by the scribe were at my direction and in my presence.  I have reviewed the chart and discharge instructions (if applicable) and agree that the record reflects my personal performance and is accurate and complete. 08/23/2020  I, [name]  personally performed the services described in this documentation.  All medical record entries made by the scribe were at my direction and in my presence.  I have reviewed the chart and discharge instructions (if applicable) and agree that the record reflects my personal performance and is accurate and complete.    [name], NP

## 2020-08-30 ENCOUNTER — Other Ambulatory Visit: Payer: Self-pay | Admitting: Internal Medicine

## 2020-08-30 DIAGNOSIS — K581 Irritable bowel syndrome with constipation: Secondary | ICD-10-CM

## 2020-09-20 ENCOUNTER — Ambulatory Visit: Payer: BC Managed Care – PPO | Admitting: Family

## 2020-09-25 ENCOUNTER — Ambulatory Visit: Payer: BC Managed Care – PPO | Admitting: Family

## 2020-09-25 ENCOUNTER — Other Ambulatory Visit: Payer: Self-pay

## 2020-09-25 VITALS — BP 145/78 | HR 66 | Temp 98.5°F | Resp 16 | Ht 68.0 in | Wt 241.0 lb

## 2020-09-25 DIAGNOSIS — E1121 Type 2 diabetes mellitus with diabetic nephropathy: Secondary | ICD-10-CM

## 2020-09-25 DIAGNOSIS — E785 Hyperlipidemia, unspecified: Secondary | ICD-10-CM | POA: Diagnosis not present

## 2020-09-25 DIAGNOSIS — I10 Essential (primary) hypertension: Secondary | ICD-10-CM

## 2020-09-25 DIAGNOSIS — K5900 Constipation, unspecified: Secondary | ICD-10-CM

## 2020-09-25 DIAGNOSIS — K581 Irritable bowel syndrome with constipation: Secondary | ICD-10-CM

## 2020-09-25 DIAGNOSIS — K219 Gastro-esophageal reflux disease without esophagitis: Secondary | ICD-10-CM

## 2020-09-25 DIAGNOSIS — R350 Frequency of micturition: Secondary | ICD-10-CM

## 2020-09-25 DIAGNOSIS — M797 Fibromyalgia: Secondary | ICD-10-CM

## 2020-09-25 LAB — LIPID PANEL
Cholesterol: 214 mg/dL — ABNORMAL HIGH (ref 0–200)
HDL: 58.2 mg/dL (ref >39.00)
LDL Cholesterol: 121 mg/dL — ABNORMAL HIGH (ref 0–99)
NonHDL: 155.59
Total CHOL/HDL Ratio: 4
Triglycerides: 173 mg/dL — ABNORMAL HIGH (ref 0.0–149.0)
VLDL: 34.6 mg/dL (ref 0.0–40.0)

## 2020-09-25 LAB — MICROALBUMIN / CREATININE URINE RATIO
Creatinine,U: 135 mg/dL
Microalb Creat Ratio: 2.7 mg/g (ref 0.0–30.0)
Microalb, Ur: 3.7 mg/dL — ABNORMAL HIGH (ref 0.0–1.9)

## 2020-09-25 MED ORDER — LINACLOTIDE 290 MCG PO CAPS: 290.0000 ug | ORAL_CAPSULE | Freq: Every day | ORAL | 1 refills | Status: DC

## 2020-09-25 MED ORDER — DULOXETINE HCL 60 MG PO CPEP: 60.0000 mg | ORAL_CAPSULE | Freq: Every day | ORAL | 1 refills | Status: DC

## 2020-09-25 NOTE — Assessment & Plan Note (Addendum)
Diet is improving, still trying to improve.  Continue januvia 100mg .  Check A1C.

## 2020-09-25 NOTE — Progress Notes (Signed)
Subjective:   By signing my name below, I, [name], attest that this documentation has been prepared under the direction and in the presence of [name], NP. 09/25/2020     Patient ID: [name], female    DOB: 03/23/1956, 65 y.o.   MRN: [id number]  No chief complaint on file.   HPI Patient is in today for a office visit. She reports she has been having constipation recently. She has abdominal pain that has woken her up at night. She is taking miralax and senakot to manage her constipation but has no relief. She notes that she has not had any bowel movement since Saturday  She is requesting retral of Linzess.   She also reports that she has a painful mole on her stomach.   Her previous cough has resolved so she has stopped taking 100 mg tessalon to manage her cough. She reports recent UTI symptoms and would like a urine culture.   She has not had her fourth Covid 19 booster vaccination.  Hypertension-  She is taking 25 mg hydrochlorothiazide to manage her hyperlipidemia. Fibromyalgia- She reports that she is still in pain. She also notes that the pain affects her sleep. She requests a refill for 60 mg Cymbalta to manage her fibromyalgia. Diet- She reports that her diet has slightly improved. She notes that she has cravings of sugars while on this diet. Allergies- She takes allegra to help manage her symptoms.Marland Kitchen GERD- She requests a refill on 40 mg protonix daily PO to manage her symptoms. She notes that she has flare ups when she stops taking the prescription. She has not started her prescribed motegrity.  Asthma- She has not used her albuterol recently because she has not had any flare ups.  Past Medical History:  Diagnosis Date  . Arthritis   . Back pain   . Benign fundic gland polyps of stomach   . Constipation   . Diabetes mellitus without complication ([place])   . Esophagitis    LA Class B  . Fatigue   . Fatty liver   . Fatty liver   . Fibromyalgia   .  Gallbladder problem   . GERD (gastroesophageal reflux disease)   . Heart murmur   . Hyperlipidemia   . Hypertension   . IBS (irritable bowel syndrome)   . Joint pain   . Neuromuscular disorder ([place])   . Prediabetes     Past Surgical History:  Procedure Laterality Date  . CHOLECYSTECTOMY  2009   low GB EF, no gallstones  . COLONOSCOPY    . LAPAROSCOPY     of adhesions/ of ovaries  . UPPER GASTROINTESTINAL ENDOSCOPY    . VAGINAL HYSTERECTOMY      Family History  Problem Relation Age of Onset  . Colon cancer Father   . Hypertension Father   . Hyperlipidemia Father   . Obesity Father   . Heart disease Mother   . Hypertension Mother   . Hyperlipidemia Mother   . Obesity Mother   . Diabetes Brother        Half  . Heart disease Brother   . Diabetes Sister   . Breast cancer Paternal Grandmother   . Esophageal cancer Neg Hx   . Stomach cancer Neg Hx   . Rectal cancer Neg Hx     Social History   Socioeconomic History  . Marital status: Single    Spouse name: Not on file  . Number of children: Not on file  .  Years of education: Not on file  . Highest education level: Not on file  Occupational History  . 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 Smoking status: Former Smoker    Packs/day: 0.10    Years: 10.00    Pack years: 1.00    Types: Cigarettes    Quit date: 07/14/1981    Years since quitting: 39.2  . Smokeless tobacco: Never Used  Vaping Use  . Vaping Use: Never used  Substance and Sexual Activity  . Alcohol use: No  . Drug use: No  . Sexual activity: Not Currently  Other Topics Concern  . Not on file  Social History Narrative   Widowed- husband passed last year   2 grown sons, grandchildren   Works for a Museum/gallery curator, Engineer, maintenance (IT)   No pets   Enjoys writing, reading, being a grandmother, walking   Engineer, water about christian books for self improvement for women   Completed college   Social Determinants of Adult nurse Strain: Not on file  Food Insecurity: Not on file  Transportation Needs: Not on file  Physical Activity: Not on file  Stress: Not on file  Social Connections: Not on file  Intimate Partner Violence: Not on file    Outpatient Medications Prior to Visit  Medication Sig Dispense Refill  . albuterol (VENTOLIN HFA) 108 (90 Base) MCG/ACT inhaler     . Ascorbic Acid (VITAMIN C) 100 MG tablet Take 100 mg by mouth daily.    Marland Kitchen azelastine (ASTELIN) 0.1 % nasal spray Place 1 spray into both nostrils 2 (two) times daily.    . benzonatate (TESSALON) 100 MG capsule Take 1 capsule (100 mg total) by mouth 3 (three) times daily as needed. (Patient not taking: Reported on 08/23/2020) 20 capsule 0  . cholecalciferol (VITAMIN D3) 25 MCG (1000 UNIT) tablet Take 1,000 Units by mouth daily.    . DULoxetine (CYMBALTA) 30 MG capsule TAKE 2 CAPSULES(60 MG) BY MOUTH DAILY 60 capsule 0  . fexofenadine (ALLEGRA) 60 MG tablet Take 60 mg by mouth 2 (two) times daily.    . hydrochlorothiazide (HYDRODIURIL) 25 MG tablet Take 1 tablet (25 mg total) by mouth daily. 90 tablet 1  . lidocaine (LIDODERM) 5 % Place 1 patch onto the skin daily. Remove & Discard patch within 12 hours or as directed by MD. 90 patch 1  . meloxicam (MOBIC) 7.5 MG tablet TAKE 1 TABLET(7.5 MG) BY MOUTH DAILY AS NEEDED FOR PAIN 30 tablet 0  . Multiple Vitamin (MULTIVITAMIN WITH MINERALS) TABS tablet Take 1 tablet by mouth daily.    . pantoprazole (PROTONIX) 40 MG tablet Take 1 tablet (40 mg total) by mouth 2 (two) times daily. 60 tablet 11  . Pitavastatin Calcium (LIVALO) 4 MG TABS Take 1 tablet (4 mg total) by mouth daily. 90 tablet 1  . Prucalopride Succinate (MOTEGRITY) 2 MG TABS Take 1 tablet (2 mg total) by mouth daily. 30 tablet 11  . Saccharomyces boulardii (PROBIOTIC) 250 MG CAPS Take by mouth.    . sitaGLIPtin (JANUVIA) 100 MG tablet Take 1 tablet (100 mg total) by mouth daily. 30 tablet 5  . zinc gluconate 50 MG tablet  Take 50 mg by mouth daily.     No facility-administered medications prior to visit.    Allergies  Allergen Reactions  . Crestor [Rosuvastatin Calcium]     Muscle aches  . Metformin And Related     abd pain  Review of Systems  Genitourinary: Negative for dysuria and frequency.       Objective:    Physical Exam Constitutional:      Appearance: She is well-developed.  Neck:     Thyroid: No thyromegaly.  Cardiovascular:     Rate and Rhythm: Normal rate and regular rhythm.     Pulses: Normal pulses.     Heart sounds: Normal heart sounds. No murmur heard.   Pulmonary:     Effort: Pulmonary effort is normal. No respiratory distress.     Breath sounds: Normal breath sounds. No wheezing.  Musculoskeletal:     Cervical back: Neck supple.  Skin:    General: Skin is warm and dry.     Comments: Dry raised mole right abdomen  Neurological:     Mental Status: She is alert and oriented to person, place, and time.  Psychiatric:        Behavior: Behavior normal.        Thought Content: Thought content normal.        Judgment: Judgment normal.     There were no vitals taken for this visit. Wt Readings from Last 3 Encounters:  08/23/20 234 lb (106.1 kg)  06/21/20 238 lb (108 kg)  06/05/20 236 lb (107 kg)    Diabetic Foot Exam - Simple   No data filed    Lab Results  Component Value Date   WBC 5.7 06/05/2020   HGB 13.7 06/05/2020   HCT 38.8 06/05/2020   PLT 223.0 06/05/2020   GLUCOSE 148 (H) 06/05/2020   CHOL 318 (H) 06/09/2020   TRIG 270 (H) 06/09/2020   HDL 46 (L) 06/09/2020   LDLDIRECT 174.0 05/25/2019   LDLCALC 224 (H) 06/09/2020   ALT 20 06/05/2020   AST 9 06/05/2020   NA 139 06/05/2020   K 3.9 06/05/2020   CL 100 06/05/2020   CREATININE 0.79 06/05/2020   BUN 10 06/05/2020   CO2 31 06/05/2020   TSH 0.57 06/05/2020   HGBA1C 6.6 (H) 06/05/2020   MICROALBUR 1.4 05/25/2019    Lab Results  Component Value Date   TSH 0.57 06/05/2020   Lab Results   Component Value Date   WBC 5.7 06/05/2020   HGB 13.7 06/05/2020   HCT 38.8 06/05/2020   MCV 85.7 06/05/2020   PLT 223.0 06/05/2020   Lab Results  Component Value Date   NA 139 06/05/2020   K 3.9 06/05/2020   CO2 31 06/05/2020   GLUCOSE 148 (H) 06/05/2020   BUN 10 06/05/2020   CREATININE 0.79 06/05/2020   BILITOT 0.4 06/05/2020   ALKPHOS 60 06/05/2020   AST 9 06/05/2020   ALT 20 06/05/2020   PROT 7.2 06/05/2020   ALBUMIN 4.3 06/05/2020   CALCIUM 9.4 06/05/2020   GFR 78.44 06/05/2020   Lab Results  Component Value Date   CHOL 318 (H) 06/09/2020   Lab Results  Component Value Date   HDL 46 (L) 06/09/2020   Lab Results  Component Value Date   LDLCALC 224 (H) 06/09/2020   Lab Results  Component Value Date   TRIG 270 (H) 06/09/2020   Lab Results  Component Value Date   CHOLHDL 6.9 (H) 06/09/2020   Lab Results  Component Value Date   HGBA1C 6.6 (H) 06/05/2020       Assessment & Plan:   Problem List Items Addressed This Visit   None      No orders of the defined types were placed in this encounter.  I, Shehryar [place], personally preformed the services described in this documentation.  All medical record entries made by the scribe were at my direction and in my presence.  I have reviewed the chart and discharge instructions (if applicable) and agree that the record reflects my personal performance and is accurate and complete. 09/25/2020   I,[name],acting as a Education administrator for Nance Pear, NP.,have documented all relevant documentation on the behalf of Nance Pear, NP,as directed by  Nance Pear, NP while in the presence of Nance Pear, NP.   Shehryar [place], NP, have reviewed all documentation for this visit. The documentation on 09/25/20 for the exam, diagnosis, procedures, and orders are all accurate and complete.

## 2020-09-25 NOTE — Assessment & Plan Note (Signed)
Uncontrolled, ran out of PPI. Restart protonix 40mg  once daily.

## 2020-09-25 NOTE — Assessment & Plan Note (Signed)
BP a bit higher than last visit, but acceptable for her age. Contineu hctz 25mg  once daily.

## 2020-09-25 NOTE — Assessment & Plan Note (Addendum)
Restart Linzess- she has had improvement with this in the past. She never started motegrity rx from GI.

## 2020-09-25 NOTE — Assessment & Plan Note (Signed)
Continue miralax, senakot, restart linzess- see below.

## 2020-09-25 NOTE — Assessment & Plan Note (Addendum)
Lab Results  Component Value Date   CHOL 318 (H) 06/09/2020   HDL 46 (L) 06/09/2020   LDLCALC 224 (H) 06/09/2020   LDLDIRECT 174.0 05/25/2019   TRIG 270 (H) 06/09/2020   CHOLHDL 6.9 (H) 06/09/2020   Tolerating livalo 4mg . Obtain follow up lipid panel.

## 2020-09-25 NOTE — Patient Instructions (Signed)
Please restart linzess and cymbalta.  Complete lab work prior to leaving.

## 2020-09-25 NOTE — Assessment & Plan Note (Addendum)
Uncontrolled off of cymbalta. Resume cymbalta 60mg .

## 2020-09-26 LAB — URINE CULTURE
MICRO NUMBER:: 11809140
SPECIMEN QUALITY:: ADEQUATE

## 2020-09-27 ENCOUNTER — Encounter: Payer: Self-pay | Admitting: Family

## 2020-09-27 ENCOUNTER — Other Ambulatory Visit: Payer: Self-pay

## 2020-09-27 ENCOUNTER — Telehealth: Payer: Self-pay | Admitting: Family

## 2020-09-27 DIAGNOSIS — R809 Proteinuria, unspecified: Secondary | ICD-10-CM

## 2020-09-27 DIAGNOSIS — E1121 Type 2 diabetes mellitus with diabetic nephropathy: Secondary | ICD-10-CM

## 2020-09-27 MED ORDER — LISINOPRIL 2.5 MG PO TABS
2.5000 mg | ORAL_TABLET | Freq: Every day | ORAL | 1 refills | Status: DC
Start: 2020-09-27 — End: 2021-04-17

## 2020-09-27 NOTE — Telephone Encounter (Signed)
Patient advised of results, she verbalized understanding and will continue to work on her diet. She will start new medication today and was scheduled to come in 10-04-2020 for labs.

## 2020-09-27 NOTE — Telephone Encounter (Addendum)
Please advise pt that her cholesterol is much better but still slightly above goal. She is on max dose of livalo- so she will need to continue livalo 4mg  and continue her work on diet.   Urine culture is negative for infection.  Also, there is some microscopic protein in her urine due to diabetes and high blood pressure.  I would recommend that we add lisinopril 2.5mg  once daily to help protect her kidneys. She will need bmet drawn one week after she begins lisinopril, dx microalbuminuria. Also needs A1C, dx DM2

## 2020-10-03 ENCOUNTER — Other Ambulatory Visit: Payer: Self-pay | Admitting: Family

## 2020-10-03 ENCOUNTER — Other Ambulatory Visit: Payer: Self-pay | Admitting: Gastroenterology

## 2020-10-03 DIAGNOSIS — H401131 Primary open-angle glaucoma, bilateral, mild stage: Secondary | ICD-10-CM | POA: Diagnosis not present

## 2020-10-04 ENCOUNTER — Other Ambulatory Visit (INDEPENDENT_AMBULATORY_CARE_PROVIDER_SITE_OTHER): Payer: BC Managed Care – PPO

## 2020-10-04 ENCOUNTER — Other Ambulatory Visit: Payer: Self-pay

## 2020-10-04 DIAGNOSIS — R809 Proteinuria, unspecified: Secondary | ICD-10-CM

## 2020-10-04 DIAGNOSIS — E1121 Type 2 diabetes mellitus with diabetic nephropathy: Secondary | ICD-10-CM | POA: Diagnosis not present

## 2020-10-04 LAB — BASIC METABOLIC PANEL
BUN: 12 mg/dL (ref 6–23)
CO2: 32 mEq/L (ref 19–32)
Calcium: 10.1 mg/dL (ref 8.4–10.5)
Chloride: 101 mEq/L (ref 96–112)
Creatinine, Ser: 0.87 mg/dL (ref 0.40–1.20)
GFR: 69.71 mL/min (ref >60.00)
Glucose, Bld: 139 mg/dL — ABNORMAL HIGH (ref 70–99)
Potassium: 4 mEq/L (ref 3.5–5.1)
Sodium: 141 mEq/L (ref 135–145)

## 2020-10-04 LAB — HEMOGLOBIN A1C: Hgb A1c MFr Bld: 6.7 % — ABNORMAL HIGH (ref 4.6–6.5)

## 2020-10-16 ENCOUNTER — Encounter: Payer: Self-pay | Admitting: Family

## 2020-10-16 ENCOUNTER — Telehealth: Payer: Self-pay | Admitting: Gastroenterology

## 2020-10-16 ENCOUNTER — Other Ambulatory Visit: Payer: Self-pay | Admitting: Gastroenterology

## 2020-10-16 MED ORDER — PANTOPRAZOLE SODIUM 40 MG PO TBEC: 40.0000 mg | DELAYED_RELEASE_TABLET | Freq: Two times a day (BID) | ORAL | 1 refills | Status: DC

## 2020-10-16 NOTE — Telephone Encounter (Signed)
Inbound call from patient. Need medication refill for Protonix to [facility] at [address] in [place]. Have appointment scheduled for 12/05/20 with Dr. Fuller Plan.

## 2020-10-16 NOTE — Telephone Encounter (Signed)
Prescription sent to patient's pharmacy until scheduled appt. 

## 2020-11-01 DIAGNOSIS — M1711 Unilateral primary osteoarthritis, right knee: Secondary | ICD-10-CM | POA: Diagnosis not present

## 2020-12-05 ENCOUNTER — Ambulatory Visit: Payer: BC Managed Care – PPO | Admitting: Gastroenterology

## 2020-12-05 ENCOUNTER — Encounter: Payer: Self-pay | Admitting: Gastroenterology

## 2020-12-05 VITALS — BP 124/80 | HR 88 | Ht 67.32 in | Wt 245.4 lb

## 2020-12-05 DIAGNOSIS — Z800 Family history of malignant neoplasm of digestive organs: Secondary | ICD-10-CM

## 2020-12-05 DIAGNOSIS — K5904 Chronic idiopathic constipation: Secondary | ICD-10-CM | POA: Diagnosis not present

## 2020-12-05 DIAGNOSIS — K2100 Gastro-esophageal reflux disease with esophagitis, without bleeding: Secondary | ICD-10-CM | POA: Diagnosis not present

## 2020-12-05 MED ORDER — PANTOPRAZOLE SODIUM 40 MG PO TBEC: 40.0000 mg | DELAYED_RELEASE_TABLET | Freq: Two times a day (BID) | ORAL | 3 refills | Status: DC

## 2020-12-05 MED ORDER — LINACLOTIDE 290 MCG PO CAPS: 290.0000 ug | ORAL_CAPSULE | Freq: Every day | ORAL | 3 refills | Status: DC

## 2020-12-05 NOTE — Patient Instructions (Signed)
We have sent the following medications to your pharmacy for you to pick up at your convenience: Linzess and pantoprazole.   You will be due for a recall colonoscopy in 12/2021. We will send you a reminder in the mail when it gets closer to that time.  Normal BMI (Body Mass Index- based on height and weight) is between 23 and 30. Your BMI today is Body mass index is 38.06 kg/m. Marland Kitchen Please consider follow up  regarding your BMI with your Primary Care Provider.  The [facility] providers would like to encourage you to use [facility] to communicate with providers for non-urgent requests or questions.  Due to long hold times on the telephone, sending your provider a message by [facility] may be a faster and more efficient way to get a response.  Please allow 48 business hours for a response.  Please remember that this is for non-urgent requests.   Thank you for choosing me and [facility].  Pricilla Riffle. Dagoberto Ligas., MD., Marval Regal

## 2020-12-05 NOTE — Progress Notes (Signed)
    History of Present Illness: This is a 66 year old female with constipation and GERD.  Her reflux symptoms are under good control.  Her constipation has been difficult.  Daily Linzess has not been effective and she has added Senokot which has been effective.  Amitiza was previously ineffective.  MiraLAX was previously ineffective.  Current Medications, Allergies, Past Medical History, Past Surgical History, Family History and Social History were reviewed in [employer] record.   Physical Exam: General: Well developed, well nourished, no acute distress Head: Normocephalic and atraumatic Eyes: Sclerae anicteric, EOMI Ears: Normal auditory acuity Mouth: Not examined, mask on during Covid-19 pandemic Lungs: Clear throughout to auscultation Heart: Regular rate and rhythm; no murmurs, rubs or bruits Abdomen: Soft, non tender and non distended. No masses, hepatosplenomegaly or hernias noted. Normal Bowel sounds Rectal: Not done  Musculoskeletal: Symmetrical with no gross deformities  Pulses:  Normal pulses noted Extremities: No clubbing, cyanosis, edema or deformities noted Neurological: Alert oriented x 4, grossly nonfocal Psychological:  Alert and cooperative. Normal mood and affect   Assessment and Recommendations:  GERD with grade B esophagitis. Pantoprazole 40 mg po qam. Follow antireflux measures. REV in 1 year.   2.   Chronic idiopathic constipation. Linzess 290 mcg po qd and Senokot daily. REV in 1 year.   3.   Family history of colon cancer. Colonoscopy in July 2023.

## 2021-01-01 DIAGNOSIS — H401131 Primary open-angle glaucoma, bilateral, mild stage: Secondary | ICD-10-CM | POA: Diagnosis not present

## 2021-03-08 ENCOUNTER — Other Ambulatory Visit: Payer: Self-pay | Admitting: Family

## 2021-03-08 DIAGNOSIS — R635 Abnormal weight gain: Secondary | ICD-10-CM | POA: Diagnosis not present

## 2021-03-08 DIAGNOSIS — E785 Hyperlipidemia, unspecified: Secondary | ICD-10-CM | POA: Diagnosis not present

## 2021-03-08 DIAGNOSIS — E119 Type 2 diabetes mellitus without complications: Secondary | ICD-10-CM | POA: Diagnosis not present

## 2021-03-08 DIAGNOSIS — N951 Menopausal and female climacteric states: Secondary | ICD-10-CM | POA: Diagnosis not present

## 2021-03-08 DIAGNOSIS — Z6838 Body mass index (BMI) 38.0-38.9, adult: Secondary | ICD-10-CM | POA: Diagnosis not present

## 2021-03-08 DIAGNOSIS — R7989 Other specified abnormal findings of blood chemistry: Secondary | ICD-10-CM | POA: Diagnosis not present

## 2021-03-13 DIAGNOSIS — I10 Essential (primary) hypertension: Secondary | ICD-10-CM | POA: Diagnosis not present

## 2021-03-13 DIAGNOSIS — E119 Type 2 diabetes mellitus without complications: Secondary | ICD-10-CM | POA: Diagnosis not present

## 2021-03-13 DIAGNOSIS — Z6838 Body mass index (BMI) 38.0-38.9, adult: Secondary | ICD-10-CM | POA: Diagnosis not present

## 2021-03-19 DIAGNOSIS — H04123 Dry eye syndrome of bilateral lacrimal glands: Secondary | ICD-10-CM | POA: Diagnosis not present

## 2021-03-19 DIAGNOSIS — H401131 Primary open-angle glaucoma, bilateral, mild stage: Secondary | ICD-10-CM | POA: Diagnosis not present

## 2021-03-20 DIAGNOSIS — Z6838 Body mass index (BMI) 38.0-38.9, adult: Secondary | ICD-10-CM | POA: Diagnosis not present

## 2021-03-20 DIAGNOSIS — E119 Type 2 diabetes mellitus without complications: Secondary | ICD-10-CM | POA: Diagnosis not present

## 2021-03-27 DIAGNOSIS — I10 Essential (primary) hypertension: Secondary | ICD-10-CM | POA: Diagnosis not present

## 2021-03-27 DIAGNOSIS — Z6838 Body mass index (BMI) 38.0-38.9, adult: Secondary | ICD-10-CM | POA: Diagnosis not present

## 2021-04-03 DIAGNOSIS — Z6838 Body mass index (BMI) 38.0-38.9, adult: Secondary | ICD-10-CM | POA: Diagnosis not present

## 2021-04-03 DIAGNOSIS — E119 Type 2 diabetes mellitus without complications: Secondary | ICD-10-CM | POA: Diagnosis not present

## 2021-04-06 ENCOUNTER — Other Ambulatory Visit: Payer: Self-pay | Admitting: Family

## 2021-04-06 DIAGNOSIS — E785 Hyperlipidemia, unspecified: Secondary | ICD-10-CM

## 2021-04-06 NOTE — Telephone Encounter (Signed)
Lvm for patient to call back, she was supposed to follow up with pcp back in July.  She needs appointment

## 2021-04-11 ENCOUNTER — Ambulatory Visit: Payer: BC Managed Care – PPO | Admitting: Family

## 2021-04-11 DIAGNOSIS — Z6838 Body mass index (BMI) 38.0-38.9, adult: Secondary | ICD-10-CM | POA: Diagnosis not present

## 2021-04-11 DIAGNOSIS — M2550 Pain in unspecified joint: Secondary | ICD-10-CM | POA: Diagnosis not present

## 2021-04-16 ENCOUNTER — Ambulatory Visit: Payer: BC Managed Care – PPO | Admitting: Family

## 2021-04-17 ENCOUNTER — Ambulatory Visit: Payer: BC Managed Care – PPO | Admitting: Family

## 2021-04-17 ENCOUNTER — Other Ambulatory Visit: Payer: Self-pay

## 2021-04-17 VITALS — BP 126/65 | HR 70 | Temp 97.6°F | Resp 16 | Wt 243.0 lb

## 2021-04-17 DIAGNOSIS — K581 Irritable bowel syndrome with constipation: Secondary | ICD-10-CM | POA: Diagnosis not present

## 2021-04-17 DIAGNOSIS — Z23 Encounter for immunization: Secondary | ICD-10-CM

## 2021-04-17 DIAGNOSIS — R809 Proteinuria, unspecified: Secondary | ICD-10-CM | POA: Diagnosis not present

## 2021-04-17 DIAGNOSIS — E1129 Type 2 diabetes mellitus with other diabetic kidney complication: Secondary | ICD-10-CM

## 2021-04-17 DIAGNOSIS — I10 Essential (primary) hypertension: Secondary | ICD-10-CM

## 2021-04-17 DIAGNOSIS — E785 Hyperlipidemia, unspecified: Secondary | ICD-10-CM

## 2021-04-17 DIAGNOSIS — K219 Gastro-esophageal reflux disease without esophagitis: Secondary | ICD-10-CM | POA: Diagnosis not present

## 2021-04-17 LAB — LIPID PANEL
Cholesterol: 162 mg/dL (ref 0–200)
HDL: 45.9 mg/dL (ref >39.00)
LDL Cholesterol: 91 mg/dL (ref 0–99)
NonHDL: 116.07
Total CHOL/HDL Ratio: 4
Triglycerides: 126 mg/dL (ref 0.0–149.0)
VLDL: 25.2 mg/dL (ref 0.0–40.0)

## 2021-04-17 LAB — COMPREHENSIVE METABOLIC PANEL
ALT: 29 U/L (ref 0–35)
AST: 12 U/L (ref 0–37)
Albumin: 4.4 g/dL (ref 3.5–5.2)
Alkaline Phosphatase: 55 U/L (ref 39–117)
BUN: 20 mg/dL (ref 6–23)
CO2: 33 mEq/L — ABNORMAL HIGH (ref 19–32)
Calcium: 10.2 mg/dL (ref 8.4–10.5)
Chloride: 99 mEq/L (ref 96–112)
Creatinine, Ser: 0.81 mg/dL (ref 0.40–1.20)
GFR: 75.66 mL/min (ref >60.00)
Glucose, Bld: 136 mg/dL — ABNORMAL HIGH (ref 70–99)
Potassium: 3.7 mEq/L (ref 3.5–5.1)
Sodium: 140 mEq/L (ref 135–145)
Total Bilirubin: 0.5 mg/dL (ref 0.2–1.2)
Total Protein: 7.5 g/dL (ref 6.0–8.3)

## 2021-04-17 LAB — HEMOGLOBIN A1C: Hgb A1c MFr Bld: 7.5 % — ABNORMAL HIGH (ref 4.6–6.5)

## 2021-04-17 MED ORDER — HYDROCHLOROTHIAZIDE 25 MG PO TABS: 25.0000 mg | ORAL_TABLET | Freq: Every day | ORAL | 1 refills | Status: DC

## 2021-04-17 MED ORDER — SITAGLIPTIN PHOSPHATE 100 MG PO TABS: ORAL_TABLET | ORAL | 5 refills | Status: DC

## 2021-04-17 MED ORDER — LISINOPRIL 2.5 MG PO TABS: 2.5000 mg | ORAL_TABLET | Freq: Every day | ORAL | 1 refills | Status: DC

## 2021-04-17 MED ORDER — DULOXETINE HCL 60 MG PO CPEP: 60.0000 mg | ORAL_CAPSULE | Freq: Every day | ORAL | 1 refills | Status: DC

## 2021-04-17 NOTE — Progress Notes (Signed)
Subjective:   By signing my name below, I, Lyric Barr-McArthur, attest that this documentation has been prepared under the direction and in the presence of [name], NP, 04/17/2021   Patient ID: [name], female    DOB: [birth year], 65 y.o.   MRN: [id number]  Chief Complaint  Patient presents with   Hyperlipidemia    Here for follow up   Hyperglycemia    Here for follow     HPI Patient is in today for an office visit.  Appetite: She mentions that she feels as though she is over eating. She believes this due to beginning [facility] plan and not seeing improvement in weight loss. She mentions that she has noticed when she does not meal prep for the week, she overeats.  Exercise: She denies regular exercise. She mentions that her son is a Physiological scientist but has not worked with him.  Abdominal pain: She complains of pain in the left side of her abdomen that began 2 months ago. She notes that it comes and goes but has not resolved.  Immunizations: She is interested in getting the updated Covid-19 bivalent booster vaccine at this time.  Constipation: She reports an improvement in her bowel movements with the use of 290 mcg Linzess. She notes 3-4 bowel movements a week versus 1-2 a week before beginning the medication.   Blood pressure: Her blood pressure is within good range today. She is compliant in taking 25 mg hydrochlorothiazide and 2.5 mg lisinopril.  BP Readings from Last 3 Encounters:  04/17/21 126/65  12/05/20 124/80  09/25/20 (!) 145/78  Reflux: She notes an improvement in her reflux symptoms with the use of 40 mg Protonix. She mentions she has not had to use the 40 mg Protonix daily but just as needed.    Health Maintenance Due  Topic Date Due   Pneumonia Vaccine 78+ Years old (2 - PCV) 10/13/2019   COVID-19 Vaccine (4 - Booster for Moderna series) 05/25/2020   OPHTHALMOLOGY EXAM  11/09/2020   MAMMOGRAM  12/01/2020   FOOT EXAM  02/17/2021   HEMOGLOBIN A1C   04/06/2021    Past Medical History:  Diagnosis Date   Arthritis    Back pain    Benign fundic gland polyps of stomach    Constipation    Diabetes mellitus without complication (HCC)    Esophagitis    LA Class B   Fatigue    Fatty liver    Fatty liver    Fibromyalgia    Gallbladder problem    GERD (gastroesophageal reflux disease)    Heart murmur    Hyperlipidemia    Hypertension    IBS (irritable bowel syndrome)    Joint pain    Neuromuscular disorder (HCC)    Prediabetes     Past Surgical History:  Procedure Laterality Date   CHOLECYSTECTOMY  2009   low GB EF, no gallstones   COLONOSCOPY     LAPAROSCOPY     of adhesions/ of ovaries   UPPER GASTROINTESTINAL ENDOSCOPY     VAGINAL HYSTERECTOMY      Family History  Problem Relation Age of Onset   Colon cancer Father    Hypertension Father    Hyperlipidemia Father    Obesity Father    Heart disease Mother    Hypertension Mother    Hyperlipidemia Mother    Obesity Mother    Diabetes Brother        Half   Heart disease Brother  Diabetes Sister    Breast cancer Paternal Grandmother    Esophageal cancer Neg Hx    Stomach cancer Neg Hx    Rectal cancer Neg Hx     Social History   Socioeconomic History   Marital status: Single    Spouse name: Not on file   Number of children: Not on file   Years of education: Not on file   Highest education level: Not on file  Occupational History   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 [employer]  Tobacco Use   Smoking status: Former    Packs/day: 0.10    Years: 10.00    Pack years: 1.00    Types: Cigarettes    Quit date: 07/14/1981    Years since quitting: 39.7   Smokeless tobacco: Never  Vaping Use   Vaping Use: Never used  Substance and Sexual Activity   Alcohol use: No   Drug use: No   Sexual activity: Not Currently  Other Topics Concern   Not on file  Social History Narrative   Widowed- husband passed last year   2 grown sons, grandchildren   Works for  a Museum/gallery curator, Engineer, maintenance (IT)   No pets   Enjoys writing, reading, being a grandmother, walking   Engineer, 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     azelastine (ASTELIN) 0.1 % nasal spray Place 1 spray into both nostrils 2 (two) times daily.     cholecalciferol (VITAMIN D3) 25 MCG (1000 UNIT) tablet Take 1,000 Units by mouth daily.     fexofenadine (ALLEGRA) 60 MG tablet Take 60 mg by mouth 2 (two) times daily.     Glucosamine-Chondroitin (GLUCOSAMINE CHONDR COMPLEX PO) Take 2 capsules by mouth daily.     lidocaine (LIDODERM) 5 % Place 1 patch onto the skin daily. Remove & Discard patch within 12 hours or as directed by MD. 90 patch 1   linaclotide (LINZESS) 290 MCG CAPS capsule Take 1 capsule (290 mcg total) by mouth daily before breakfast. 90 capsule 3   LIVALO 4 MG TABS TAKE 1 TABLET(4 MG) BY MOUTH DAILY 30 tablet 5   Multiple Vitamin (MULTIVITAMIN WITH MINERALS) TABS tablet Take 1 tablet by mouth daily.     pantoprazole (PROTONIX) 40 MG tablet Take 1 tablet (40 mg total) by mouth 2 (two) times daily. 180 tablet 3   Saccharomyces boulardii (PROBIOTIC) 250 MG CAPS Take by mouth.     timolol (TIMOPTIC) 0.5 % ophthalmic solution Place 1 drop into the left eye 2 (two) times daily.     zinc gluconate 50 MG tablet Take 50 mg by mouth daily.     benzonatate (TESSALON) 100 MG capsule Take 1 capsule (100 mg total) by mouth 3 (three) times daily as needed. 20 capsule 0   DULoxetine  (CYMBALTA) 60 MG capsule Take 1 capsule (60 mg total) by mouth daily. 90 capsule 1   hydrochlorothiazide (HYDRODIURIL) 25 MG tablet  Take 1 tablet (25 mg total) by mouth daily. 90 tablet 1   JANUVIA 100 MG tablet TAKE 1 TABLET(100 MG) BY MOUTH DAILY 30 tablet 5   lisinopril (ZESTRIL) 2.5 MG tablet Take 1 tablet (2.5 mg total) by mouth daily. 90 tablet 1   meloxicam (MOBIC) 7.5 MG tablet TAKE 1 TABLET(7.5 MG) BY MOUTH DAILY AS NEEDED FOR PAIN 30 tablet 0   methocarbamol (ROBAXIN) 500 MG tablet Take 1 tablet by mouth as needed.     No facility-administered medications prior to visit.    Allergies  Allergen Reactions   Crestor [Rosuvastatin Calcium]     Muscle aches   Metformin And Related     abd pain    Review of Systems  Constitutional:        (+) overeating   Gastrointestinal:  Positive for abdominal pain (In left side of abdomen) and constipation (Improved but not resolved).      Objective:    Physical Exam Constitutional:      General: She is not in acute distress.    Appearance: Normal appearance. She is not ill-appearing.  HENT:     Head: Normocephalic and atraumatic.     Right Ear: External ear normal.     Left Ear: External ear normal.  Eyes:     Extraocular Movements: Extraocular movements intact.     Pupils: Pupils are equal, round, and reactive to light.  Cardiovascular:     Rate and Rhythm: Normal rate and regular rhythm.     Heart sounds: Normal heart sounds. No murmur heard.   No gallop.  Pulmonary:     Effort: Pulmonary effort is normal. No respiratory distress.     Breath sounds: Normal breath sounds. No wheezing or rales.  Lymphadenopathy:     Cervical: No cervical adenopathy.  Skin:    General: Skin is warm and dry.  Neurological:     Mental Status: She is alert and oriented to person, place, and time.  Psychiatric:        Behavior: Behavior normal.        Judgment: Judgment normal.    BP 126/65 (BP Location: Right Arm, Patient Position:  Sitting, Cuff Size: Large)   Pulse 70   Temp 97.6 F (36.4 C) (Oral)   Resp 16   Wt 243 lb (110.2 kg)   SpO2 99%   BMI 37.69 kg/m  Wt Readings from Last 3 Encounters:  04/17/21 243 lb (110.2 kg)  12/05/20 245 lb 6 oz (111.3 kg)  09/25/20 241 lb (109.3 kg)       Assessment & Plan:   Problem List Items Addressed This Visit       Unprioritized   IBS (irritable bowel syndrome)    She is still taking linzess.  Thinks that she is still constipated.  She is moving her bowels about 4 x a week.        HYPERTENSION, BENIGN ESSENTIAL    BP Readings from Last 3 Encounters:  04/17/21 126/65  12/05/20 124/80  09/25/20 (!) 145/78  Stable on HCTZ and lisinopril.      Relevant Medications   hydrochlorothiazide (HYDRODIURIL) 25 MG tablet   lisinopril (ZESTRIL) 2.5 MG tablet   Other Relevant Orders   Comp Met (CMET)   Hyperlipidemia with target LDL less than 100    Lab Results  Component Value Date   CHOL 214 (H) 09/25/2020   HDL 58.20 09/25/2020   LDLCALC 121 (H) 09/25/2020   LDLDIRECT 174.0 05/25/2019  TRIG 173.0 (H) 09/25/2020   CHOLHDL 4 09/25/2020  Continue livalo, will repeat lipid panel.       Relevant Medications   hydrochlorothiazide (HYDRODIURIL) 25 MG tablet   lisinopril (ZESTRIL) 2.5 MG tablet   Other Relevant Orders   Lipid panel   GERD    She continues protonix 70m once daily.       Controlled type 2 diabetes mellitus with microalbuminuria, without long-term current use of insulin (HCC)    Lab Results  Component Value Date   HGBA1C 6.7 (H) 10/04/2020   HGBA1C 6.6 (H) 06/05/2020   HGBA1C 7.3 (H) 02/18/2020   Lab Results  Component Value Date   MICROALBUR 3.7 (H) 09/25/2020   LDLCALC 121 (H) 09/25/2020   CREATININE 0.87 10/04/2020  Clinically stable on Januvia. Continue same, obtain A1C. We discussed strategies for weight loss.       Relevant Medications   lisinopril (ZESTRIL) 2.5 MG tablet   sitaGLIPtin (JANUVIA) 100 MG tablet   Other Relevant  Orders   Hemoglobin A1c   Other Visit Diagnoses     Needs flu shot    -  Primary   Relevant Orders   Flu Vaccine QUAD High Dose(Fluad) (Completed)      Meds ordered this encounter  Medications   DULoxetine (CYMBALTA) 60 MG capsule    Sig: Take 1 capsule (60 mg total) by mouth daily.    Dispense:  90 capsule    Refill:  1    Order Specific Question:   Supervising Provider    Answer:   BPenni HomansA [[id number]]   hydrochlorothiazide (HYDRODIURIL) 25 MG tablet    Sig: Take 1 tablet (25 mg total) by mouth daily.    Dispense:  90 tablet    Refill:  1    Order Specific Question:   Supervising Provider    Answer:   BPenni HomansA [[id number]]   lisinopril (ZESTRIL) 2.5 MG tablet    Sig: Take 1 tablet (2.5 mg total) by mouth daily.    Dispense:  90 tablet    Refill:  1    Order Specific Question:   Supervising Provider    Answer:   BPenni HomansA [[id number]]   sitaGLIPtin (JANUVIA) 100 MG tablet    Sig: TAKE 1 TABLET(100 MG) BY MOUTH DAILY    Dispense:  30 tablet    Refill:  5    Order Specific Question:   Supervising Provider    Answer:   BPenni HomansA [[id number]]    I, MDebbrah Alar NP, personally preformed the services described in this documentation.  All medical record entries made by the scribe were at my direction and in my presence.  I have reviewed the chart and discharge instructions (if applicable) and agree that the record reflects my personal performance and is accurate and complete. 04/17/2021  I,Lyric Barr-McArthur,acting as a sEducation administratorfor MNance Pear NP.,have documented all relevant documentation on the behalf of MNance Pear NP,as directed by  MNance Pear NP while in the presence of MNance Pear NP.  MNance Pear NP

## 2021-04-17 NOTE — Assessment & Plan Note (Addendum)
Lab Results  Component Value Date   HGBA1C 6.7 (H) 10/04/2020   HGBA1C 6.6 (H) 06/05/2020   HGBA1C 7.3 (H) 02/18/2020   Lab Results  Component Value Date   MICROALBUR 3.7 (H) 09/25/2020   LDLCALC 121 (H) 09/25/2020   CREATININE 0.87 10/04/2020   Clinically stable on Januvia. Continue same, obtain A1C. We discussed strategies for weight loss.

## 2021-04-17 NOTE — Assessment & Plan Note (Signed)
She continues protonix 40mg  once daily.

## 2021-04-17 NOTE — Assessment & Plan Note (Signed)
She is still taking linzess.  Thinks that she is still constipated.  She is moving her bowels about 4 x a week.

## 2021-04-17 NOTE — Assessment & Plan Note (Signed)
Lab Results  Component Value Date   CHOL 214 (H) 09/25/2020   HDL 58.20 09/25/2020   LDLCALC 121 (H) 09/25/2020   LDLDIRECT 174.0 05/25/2019   TRIG 173.0 (H) 09/25/2020   CHOLHDL 4 09/25/2020   Continue livalo, will repeat lipid panel.

## 2021-04-17 NOTE — Assessment & Plan Note (Signed)
BP Readings from Last 3 Encounters:  04/17/21 126/65  12/05/20 124/80  09/25/20 (!) 145/78   Stable on HCTZ and lisinopril.

## 2021-04-23 DIAGNOSIS — M4316 Spondylolisthesis, lumbar region: Secondary | ICD-10-CM | POA: Diagnosis not present

## 2021-04-23 DIAGNOSIS — M419 Scoliosis, unspecified: Secondary | ICD-10-CM | POA: Diagnosis not present

## 2021-04-23 DIAGNOSIS — M25561 Pain in right knee: Secondary | ICD-10-CM | POA: Diagnosis not present

## 2021-05-07 DIAGNOSIS — I10 Essential (primary) hypertension: Secondary | ICD-10-CM | POA: Diagnosis not present

## 2021-05-07 DIAGNOSIS — Z6838 Body mass index (BMI) 38.0-38.9, adult: Secondary | ICD-10-CM | POA: Diagnosis not present

## 2021-05-14 DIAGNOSIS — E119 Type 2 diabetes mellitus without complications: Secondary | ICD-10-CM | POA: Diagnosis not present

## 2021-05-14 DIAGNOSIS — Z6837 Body mass index (BMI) 37.0-37.9, adult: Secondary | ICD-10-CM | POA: Diagnosis not present

## 2021-05-15 ENCOUNTER — Ambulatory Visit (INDEPENDENT_AMBULATORY_CARE_PROVIDER_SITE_OTHER): Payer: BC Managed Care – PPO | Admitting: Family

## 2021-05-15 ENCOUNTER — Ambulatory Visit: Payer: BC Managed Care – PPO | Attending: Internal Medicine

## 2021-05-15 ENCOUNTER — Telehealth: Payer: Self-pay | Admitting: Family

## 2021-05-15 ENCOUNTER — Encounter: Payer: Self-pay | Admitting: Family

## 2021-05-15 VITALS — BP 126/71 | HR 70 | Temp 98.2°F | Resp 16 | Ht 67.0 in | Wt 241.0 lb

## 2021-05-15 DIAGNOSIS — Z0000 Encounter for general adult medical examination without abnormal findings: Secondary | ICD-10-CM | POA: Insufficient documentation

## 2021-05-15 DIAGNOSIS — Z23 Encounter for immunization: Secondary | ICD-10-CM

## 2021-05-15 NOTE — Assessment & Plan Note (Addendum)
Wt Readings from Last 3 Encounters:  05/15/21 241 lb (109.3 kg)  04/17/21 243 lb (110.2 kg)  12/05/20 245 lb 6 oz (111.3 kg)   Discussed healthy diet, exercise, weight loss. Prevnar 20 booster today. Recommended that she also get a Mudlogger. Will request mammogram from her GYN.

## 2021-05-15 NOTE — Addendum Note (Signed)
Addended by: Jiles Prows on: 05/15/2021 05:21 PM   Modules accepted: Orders

## 2021-05-15 NOTE — Telephone Encounter (Signed)
Please call Dr. Sherran Needs office and request a copy of her mammogram.

## 2021-05-15 NOTE — Progress Notes (Signed)
Subjective:   By signing my name below, I, Lyric Barr-McArthur, attest that this documentation has been prepared under the direction and in the presence of [name], NP, 05/15/2021   Patient ID: [name], female    DOB: 04/08/1956, 65 y.o.   MRN: [id number]  Chief Complaint  Patient presents with   Annual Exam    Here for Annual Exam     HPI Patient is in today for an annual physical exam.   She denies any fever, unexpected weight change, adenopathy, rash, hearing loss, ear pain, rhinorrhea, visual disturbances, eye pain, chest pain, leg swelling, cough, nausea, vomitting, diarrhea, blood in stool, dysuria, frequency, myalgias, arthralgias, headaches, depression or anxiety.  Weight loss: She notes that she has been working with the [facility]. She expresses that it is expensive but it has really improved her diet and lifestyle.  Joint pain: She has a long history of joint pain in her knees. She reports that as of today her back is also causing her pain as well.  Immunizations: She has already received her flu vaccine at this time. She had her pneumonia shot in 2020 and is interested in getting the pneumonia booster in the office today. She has had three total Covid-19 vaccines and plans to get the updated Covid-19 bivalent booster at a later date.  Diet: She reports she is doing well with a healthy diet. She notes that the change has been drastic but she knows it is for a good reason.  Exercise: She notes that she recently started increasing her exercise because her knee pain is improving.  Colonoscopy: Her last colonoscopy was performed on 01/14/2017 and results were normal. Repeat in 10 years.  Pap Smear: Her last pap smear was performed on 01/18/2015 and results were normal.  Mammogram: Her last mammogram on file was performed on 12/02/2018 and the results were normal. She reports she has had a more recently updated one and she has another mammogram  scheduled for February of next year.  Alcohol: She does not consume any alcohol at this time.   Drugs: She does not use drugs.  Tobacco: She does not use tobacco products at this time.  Dental: She is UTD on dental care .  Vision: She is UTD on vision care. SHx: She reports no new surgeries in the last year.  FMHx: She notes no new changes to her family history.     Health Maintenance Due  Topic Date Due   Pneumonia Vaccine 80+ Years old (2 - PCV) 10/13/2019   COVID-19 Vaccine (4 - Booster for Moderna series) 05/25/2020   OPHTHALMOLOGY EXAM  11/09/2020   MAMMOGRAM  12/01/2020   FOOT EXAM  02/17/2021    Past Medical History:  Diagnosis Date   Arthritis    Back pain    Benign fundic gland polyps of stomach    Constipation    Diabetes mellitus without complication (HCC)    Esophagitis    LA Class B   Fatigue    Fatty liver    Fatty liver    Fibromyalgia    Gallbladder problem    GERD (gastroesophageal reflux disease)    Heart murmur    Hyperlipidemia    Hypertension    IBS (irritable bowel syndrome)    Joint pain    Neuromuscular disorder ([place])    Prediabetes     Past Surgical History:  Procedure Laterality Date   CHOLECYSTECTOMY  2009   low GB EF,  no gallstones   COLONOSCOPY     LAPAROSCOPY     of adhesions/ of ovaries   UPPER GASTROINTESTINAL ENDOSCOPY     VAGINAL HYSTERECTOMY      Family History  Problem Relation Age of Onset   Colon cancer Father    Hypertension Father    Hyperlipidemia Father    Obesity Father    Heart disease Mother    Hypertension Mother    Hyperlipidemia Mother    Obesity Mother    Diabetes Brother        8   Heart disease Brother    Diabetes Sister    Breast cancer Paternal Grandmother    Esophageal cancer Neg Hx    Stomach cancer Neg Hx    Rectal cancer Neg Hx     Social History   Socioeconomic History   Marital status: Single    Spouse name: Not on file   Number of children: Not on file   Years of education:  Not on file   Highest education level: Not on file  Occupational History   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  Tobacco Use   Smoking status: Former    Packs/day: 0.10    Years: 10.00    Pack years: 1.00    Types: Cigarettes    Quit date: 07/14/1981    Years since quitting: 39.8   Smokeless tobacco: Never  Vaping Use   Vaping Use: Never used  Substance and Sexual Activity   Alcohol use: No   Drug use: No   Sexual activity: Not Currently  Other Topics Concern   Not on file  Social History Narrative   Widowed- husband passed last year   2 grown sons, grandchildren   Works for a Museum/gallery curator, Engineer, maintenance (IT)   No pets   Enjoys writing, reading, being a grandmother, walking   Engineer, 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     albuterol (VENTOLIN HFA) 108 (90 Base) MCG/ACT inhaler      Ascorbic Acid (VITAMIN C) 100 MG tablet Take 100 mg by mouth daily.     azelastine (ASTELIN) 0.1 % nasal spray Place 1 spray into both nostrils 2 (two) times daily.     cholecalciferol (VITAMIN D3) 25 MCG (1000 UNIT) tablet Take 1,000 Units by mouth daily.     DULoxetine (CYMBALTA) 60 MG capsule Take 1 capsule (60 mg total) by mouth daily. 90 capsule 1   fexofenadine (ALLEGRA) 60 MG tablet Take 60 mg by mouth 2 (two) times daily.     Glucosamine-Chondroitin (GLUCOSAMINE CHONDR COMPLEX PO) Take 2 capsules by mouth daily.     hydrochlorothiazide (HYDRODIURIL) 25 MG tablet Take 1 tablet (25 mg total) by mouth daily. 90 tablet 1    lidocaine (LIDODERM) 5 % Place 1 patch onto the skin daily. Remove & Discard patch within 12 hours or as directed by MD. 90 patch 1   linaclotide (LINZESS) 290 MCG CAPS capsule Take 1 capsule (290 mcg total) by mouth daily before breakfast.  90 capsule 3   lisinopril (ZESTRIL) 2.5 MG tablet Take 1 tablet (2.5 mg total) by mouth daily. 90 tablet 1   LIVALO 4 MG TABS TAKE 1 TABLET(4 MG) BY MOUTH DAILY 30 tablet 5   Multiple Vitamin (MULTIVITAMIN WITH MINERALS) TABS tablet Take 1 tablet by mouth daily.     pantoprazole (PROTONIX) 40 MG tablet Take 1 tablet (40 mg total) by mouth 2 (two) times daily. 180 tablet 3   Saccharomyces boulardii (PROBIOTIC) 250 MG CAPS Take by mouth.     sitaGLIPtin (JANUVIA) 100 MG tablet TAKE 1 TABLET(100 MG) BY MOUTH DAILY (Patient not taking: Reported on 05/15/2021) 30 tablet 5   timolol (TIMOPTIC) 0.5 % ophthalmic solution Place 1 drop into the left eye 2 (two) times daily.     zinc gluconate 50 MG tablet Take 50 mg by mouth daily.     No facility-administered medications prior to visit.    Allergies  Allergen Reactions   Crestor [Rosuvastatin Calcium]     Muscle aches   Metformin And Related     abd pain    Review of Systems  Constitutional:  Negative for fever.       (-) unexpected weight changes (-) adenopathy  HENT:  Negative for ear pain and hearing loss.        (-) rhinorrhea  Eyes:  Negative for pain.       (-) visual disturbances  Respiratory:  Negative for cough.   Cardiovascular:  Negative for chest pain and leg swelling.  Gastrointestinal:  Negative for blood in stool, constipation (improved with healthy diet), diarrhea, nausea and vomiting.  Genitourinary:  Negative for dysuria and frequency.  Musculoskeletal:  Positive for joint pain (in knees and back). Negative for myalgias.  Skin:  Negative for rash.  Neurological:  Negative for headaches.  Psychiatric/Behavioral:  Negative for depression. The patient is not nervous/anxious.        Objective:    Physical Exam Constitutional:      General: She is not in acute distress.    Appearance: Normal appearance. She is not ill-appearing.  HENT:     Head: Normocephalic and atraumatic.     Right Ear: External ear normal. There is impacted cerumen.     Left Ear: External ear normal. There is impacted cerumen.  Eyes:     Extraocular Movements: Extraocular movements intact.     Pupils: Pupils are equal, round, and reactive to light.     Comments: (-) nystagmus  Cardiovascular:     Rate and Rhythm: Normal rate and regular rhythm.     Pulses:          Dorsalis pedis pulses are 2+ on the right side and 2+ on the left side.       Posterior tibial pulses are 2+ on the right side and 2+ on the left side.     Heart sounds: Normal heart sounds. No murmur heard.   No gallop.  Pulmonary:     Effort: Pulmonary effort is normal. No respiratory distress.     Breath sounds: Normal breath sounds. No wheezing or rales.  Musculoskeletal:     Comments: (+) 5/5 upper and lower extremity strength  Lymphadenopathy:     Cervical: No cervical adenopathy.  Skin:    General: Skin is warm and dry.  Neurological:     Mental Status: She is alert and oriented to person, place, and time.     Deep Tendon Reflexes:     Reflex Scores:  Patellar reflexes are 2+ on the right side and 2+ on the left side. Psychiatric:        Behavior: Behavior normal.        Judgment: Judgment normal.    BP 126/71 (BP Location: Right Arm, Patient Position: Sitting, Cuff Size: Large)   Pulse 70   Temp 98.2 F (36.8 C) (Oral)   Resp 16   Ht 5\' 7"  (1.702 m)   Wt 241 lb (109.3 kg)   SpO2 100%   BMI 37.75 kg/m  Wt Readings from Last 3 Encounters:  05/15/21 241 lb (109.3 kg)  04/17/21 243 lb (110.2 kg)  12/05/20 245 lb 6 oz (111.3 kg)       Assessment & Plan:   Problem List Items Addressed This Visit       Unprioritized   Preventative health care    Wt Readings from Last 3 Encounters:  05/15/21  241 lb (109.3 kg)  04/17/21 243 lb (110.2 kg)  12/05/20 245 lb 6 oz (111.3 kg)  Discussed healthy diet, exercise, weight loss. Prevnar 20 booster today. Recommended that she also get a Mudlogger. Will request mammogram from her GYN.        No orders of the defined types were placed in this encounter.   I, Nance Pear, NP, personally preformed the services described in this documentation.  All medical record entries made by the scribe were at my direction and in my presence.  I have reviewed the chart and discharge instructions (if applicable) and agree that the record reflects my personal performance and is accurate and complete. 05/15/2021  I,Lyric Barr-McArthur,acting as a Education administrator for Nance Pear, NP.,have documented all relevant documentation on the behalf of Nance Pear, NP,as directed by  Nance Pear, NP while in the presence of Nance Pear, NP.  Nance Pear, NP

## 2021-05-15 NOTE — Telephone Encounter (Signed)
Please call [facility] and request copy of DM eye exam.

## 2021-05-15 NOTE — Progress Notes (Signed)
° °  Covid-19 Vaccination Clinic  Name:  [name]    MRN: [id number] DOB: 11-May-1956  05/15/2021  [name] was observed post Covid-19 immunization for 15 minutes without incident. She was provided with Vaccine Information Sheet and instruction to access the V-Safe system.   [name] was instructed to call 911 with any severe reactions post vaccine: Difficulty breathing  Swelling of face and throat  A fast heartbeat  A bad rash all over body  Dizziness and weakness   Immunizations Administered     Name Date Dose VIS Date Route   Moderna Covid-19 vaccine Bivalent Booster 05/15/2021  9:32 AM 0.5 mL 01/13/2021 Intramuscular   Manufacturer: Levan Hurst   Lot: [id number]   [place]: [id number]

## 2021-05-15 NOTE — Telephone Encounter (Signed)
Records release request faxed 

## 2021-05-16 ENCOUNTER — Other Ambulatory Visit (HOSPITAL_BASED_OUTPATIENT_CLINIC_OR_DEPARTMENT_OTHER): Payer: Self-pay

## 2021-05-16 DIAGNOSIS — H02831 Dermatochalasis of right upper eyelid: Secondary | ICD-10-CM | POA: Diagnosis not present

## 2021-05-16 DIAGNOSIS — H401131 Primary open-angle glaucoma, bilateral, mild stage: Secondary | ICD-10-CM | POA: Diagnosis not present

## 2021-05-16 DIAGNOSIS — E119 Type 2 diabetes mellitus without complications: Secondary | ICD-10-CM | POA: Diagnosis not present

## 2021-05-16 DIAGNOSIS — H04123 Dry eye syndrome of bilateral lacrimal glands: Secondary | ICD-10-CM | POA: Diagnosis not present

## 2021-05-16 LAB — HM DIABETES EYE EXAM

## 2021-05-16 MED ORDER — MODERNA COVID-19 BIVAL BOOSTER 50 MCG/0.5ML IM SUSP
INTRAMUSCULAR | 0 refills | Status: DC
  Filled 2021-05-16: qty 0.5, 1d supply, fill #0

## 2021-05-29 DIAGNOSIS — I10 Essential (primary) hypertension: Secondary | ICD-10-CM | POA: Diagnosis not present

## 2021-05-29 DIAGNOSIS — Z6837 Body mass index (BMI) 37.0-37.9, adult: Secondary | ICD-10-CM | POA: Diagnosis not present

## 2021-05-29 DIAGNOSIS — E119 Type 2 diabetes mellitus without complications: Secondary | ICD-10-CM | POA: Diagnosis not present

## 2021-06-15 DIAGNOSIS — Z6837 Body mass index (BMI) 37.0-37.9, adult: Secondary | ICD-10-CM | POA: Diagnosis not present

## 2021-06-15 DIAGNOSIS — K219 Gastro-esophageal reflux disease without esophagitis: Secondary | ICD-10-CM | POA: Diagnosis not present

## 2021-07-17 DIAGNOSIS — Z1231 Encounter for screening mammogram for malignant neoplasm of breast: Secondary | ICD-10-CM | POA: Diagnosis not present

## 2021-07-17 DIAGNOSIS — Z01419 Encounter for gynecological examination (general) (routine) without abnormal findings: Secondary | ICD-10-CM | POA: Diagnosis not present

## 2021-07-17 DIAGNOSIS — Z6836 Body mass index (BMI) 36.0-36.9, adult: Secondary | ICD-10-CM | POA: Diagnosis not present

## 2021-07-18 ENCOUNTER — Other Ambulatory Visit: Payer: Self-pay | Admitting: Obstetrics and Gynecology

## 2021-07-18 DIAGNOSIS — Z8249 Family history of ischemic heart disease and other diseases of the circulatory system: Secondary | ICD-10-CM

## 2021-08-06 DIAGNOSIS — H401131 Primary open-angle glaucoma, bilateral, mild stage: Secondary | ICD-10-CM | POA: Diagnosis not present

## 2021-08-22 ENCOUNTER — Ambulatory Visit
Admission: RE | Admit: 2021-08-22 | Discharge: 2021-08-22 | Disposition: A | Discharge status: Home / Self Care | Payer: No Typology Code available for payment source | Source: Ambulatory Visit | Attending: Obstetrics and Gynecology | Admitting: Obstetrics and Gynecology

## 2021-08-22 DIAGNOSIS — E785 Hyperlipidemia, unspecified: Secondary | ICD-10-CM | POA: Diagnosis not present

## 2021-08-22 DIAGNOSIS — Z8249 Family history of ischemic heart disease and other diseases of the circulatory system: Secondary | ICD-10-CM

## 2021-08-29 DIAGNOSIS — M1711 Unilateral primary osteoarthritis, right knee: Secondary | ICD-10-CM | POA: Diagnosis not present

## 2021-10-05 ENCOUNTER — Ambulatory Visit: Payer: BC Managed Care – PPO | Admitting: Family Medicine

## 2021-10-05 ENCOUNTER — Encounter: Payer: Self-pay | Admitting: Family Medicine

## 2021-10-05 ENCOUNTER — Other Ambulatory Visit (HOSPITAL_BASED_OUTPATIENT_CLINIC_OR_DEPARTMENT_OTHER): Payer: Self-pay

## 2021-10-05 ENCOUNTER — Telehealth: Payer: Self-pay | Admitting: Family

## 2021-10-05 VITALS — BP 128/70 | HR 72 | Temp 97.8°F | Resp 16 | Ht 68.0 in | Wt 240.0 lb

## 2021-10-05 DIAGNOSIS — L678 Other hair color and hair shaft abnormalities: Secondary | ICD-10-CM | POA: Diagnosis not present

## 2021-10-05 DIAGNOSIS — K1379 Other lesions of oral mucosa: Secondary | ICD-10-CM

## 2021-10-05 MED ORDER — NYSTATIN 100000 UNIT/ML MT SUSP
Freq: Four times a day (QID) | OROMUCOSAL | 1 refills | Status: DC | PRN
  Filled 2021-10-05: qty 240, 12d supply, fill #0

## 2021-10-05 MED ORDER — AMOXICILLIN 500 MG PO CAPS
500.0000 mg | ORAL_CAPSULE | Freq: Three times a day (TID) | ORAL | 0 refills | Status: AC
  Filled 2021-10-05: qty 21, 7d supply, fill #0

## 2021-10-05 NOTE — Telephone Encounter (Signed)
Pt stated she had a bump on her cheek that is very painful. It is making her worried because she has a slight fever, and slightly lightheaded. No numbess, but there was facial swelling yesterday. Put her on the schedule with Lovena Le and transferred to triage.  ?

## 2021-10-05 NOTE — Patient Instructions (Signed)
For your mouth pain: ?Magic mouthwash sent in  ?No obvious signs of abscess, but given the severity of your symptoms, will see ifyou respond to an antibiotic.  ?Use tylenol and ibuprofen for pain management, heating pad to face/cheek, etc.  ?Recommend following up with your dentist ? ?For your hairline/growth concerns - referral to dermatologist placed at your request  ?

## 2021-10-05 NOTE — Telephone Encounter (Signed)
Patient in the office to see Lovena Le B. ?

## 2021-10-05 NOTE — Telephone Encounter (Signed)
Nurse Assessment ?Nurse: Sheppard Plumber, RN, Estill Bamberg Date/Time (Eastern Time): 10/05/2021 8:18:27 AM ?Confirm and document reason for call. If ?symptomatic, describe symptoms. ?---caller states she is having pain on right side of ?head to face. has ulcer in her mouth. she is getting ?lightheaded. last night she was nauseous. may be ?getting a fever. chills ?Does the patient have any new or worsening ?symptoms? ---Yes ?Will a triage be completed? ---Yes ?Related visit to physician within the last 2 weeks? ---No ?Does the PT have any chronic conditions? (i.e. ?diabetes, asthma, this includes High risk factors for ?pregnancy, etc.) ?---Yes ?List chronic conditions. ---DM, HTN ?Is this a behavioral health or substance abuse call? ---No ?Guidelines ?Guideline Title Affirmed Question Affirmed Notes Nurse Date/Time (Eastern ?Time) ?Face Pain Difficulty breathing ?or unusual sweating ?(e.g., sweating ?without exertion) ?[place], RN, ?Estill Bamberg ?10/05/2021 8:22:44 AM ?Disp. Time (Eastern ?Time) Disposition Final User ?10/05/2021 8:25:35 AM Go to ED Now Yes [place], RN, Estill Bamberg ?PLEASE NOTE: All timestamps contained within this report are represented as [place] Standard Time. ?CONFIDENTIALTY NOTICE: This fax transmission is intended only for the addressee. It contains information that is legally privileged, confidential or ?otherwise protected from use or disclosure. If you are not the intended recipient, you are strictly prohibited from reviewing, disclosing, copying using ?or disseminating any of this information or taking any action in reliance on or regarding this information. If you have received this fax in error, please ?notify us immediately by telephone so that we can arrange for its return to [place]. Phone: [phone], Toll-Free: [phone], Fax: [phone] ?Page: 2 of 2 ?Call Id: [id number] ?Caller Disagree/Comply Comply ?Caller Understands Yes ?PreDisposition InappropriateToAsk ?Care Advice Given Per Guideline ?GO TO ED NOW: *  You need to be seen in the Emergency Department. * Go to the ED at ___________ [facility] ?given per Face Pain (Adult) guideline. * Patient should not delay going to the emergency department. BRING MEDICINES: * Bring ?a list of your current medicines when you go to the Emergency Department (ER). * Bring the pill bottles too. This will help the ?doctor (or NP/PA) to make certain you are taking the right medicines and the right dose. ?Comments ?User: Rozelle Logan, RN Date/Time Eilene Ghazi Time): 10/05/2021 8:26:49 AM ?does not want to go to ER. has appointment at 920am this morning and wants to keep that ?User: Rozelle Logan, RN Date/Time Eilene Ghazi Time): 10/05/2021 8:27:59 AM ?mouth ulcer, facial pain with swelling, sweating, light headed ( felt like passing out last night), possible fever, ?nausea ?Referrals ?[facility] - ED ?GO TO FACILITY REFUSED ?

## 2021-10-05 NOTE — Progress Notes (Signed)
? ?Acute Office Visit ? ?Subjective:  ? ?  ?Patient ID: [name], female    DOB: [birth year], 66 y.o.   MRN: [id number] ? ?Chief Complaint  ?Patient presents with  ? Mouth Lesions  ?  Here for ulcers pain in mouth right side of face   ? ? ? ?Patient is in today for mouth pain.  ? ?Patient has been feeling off for the past week, but felt significantly worse starting yesterday. Reports she has been having some right sided facial pain (5/10 on average) radiating to a right sided headache which she describes as pressure/aching/stabbing. Pain is worse with eating/talking. She has not noticed any skin changes. Reporting dental pain on that side as well, but has not noticed any oral lesions/abscesses other than a small ulcer/abrasion to the roof of her mouth. She has gotten minimal improvement with OTC mouthwash. Reports the discomfort to her right mouth/face has been significant enough to make her feel nauseous and lightheaded at times. She did feel feverish yesterday, but felt too bad to get up and check her temperature. She denies any ongoing dental problems, unusual odor/taste or any drainage in her mouth. She tried calling her dentist today, but they were closed.  ? ? ? ?Review of systems  ?All review of systems negative except what is listed in the HPI ? ? ? ?   ?Objective:  ?  ?BP 128/70 (BP Location: Right Arm, Patient Position: Sitting, Cuff Size: Normal)   Pulse 72   Temp 97.8 ?F (36.6 ?C) (Oral)   Resp 16   Ht '5\' 8"'$  (1.727 m)   Wt 240 lb (108.9 kg)   SpO2 98%   BMI 36.49 kg/m?  ? ? ?Physical Exam ?Vitals reviewed.  ?Constitutional:   ?   Appearance: Normal appearance.  ?HENT:  ?   Head:  ?   Comments: No obvious swelling, erythema, or asymmetry ?   Mouth/Throat:  ?   Mouth: Mucous membranes are moist.  ?   Pharynx: Oropharynx is clear.  ?   Comments: No visible inflammation/abscesses/erythema; there is a small abrasion/ulceration to hard palate, no drainage or surrounding erythema, edema   ?Neurological:  ?   General: No focal deficit present.  ?   Mental Status: She is alert and oriented to person, place, and time. Mental status is at baseline.  ?Psychiatric:     ?   Mood and Affect: Mood normal.     ?   Behavior: Behavior normal.     ?   Thought Content: Thought content normal.     ?   Judgment: Judgment normal.  ? ? ?No results found for any visits on 10/05/21. ? ? ?   ?Assessment & Plan:  ? ?1. Acute pain of mouth ?Magic mouthwash sent in  ?No obvious signs of abscess, but given the severity of your symptoms, will see if you respond to an antibiotic.  ?Use tylenol and ibuprofen for pain management, heating pad to face/cheek, etc.  ?Recommend following up with your dentist ?- diphenhydrAMINE-nystatin-hydrocortisone; Swish and spit 5 mls 4 times daily as needed for mouth pain as directed.  Dispense: 240 mL; Refill: 1 ?- amoxicillin (AMOXIL) 500 MG capsule; Take 1 capsule (500 mg total) by mouth 3 (three) times daily for 7 days.  Dispense: 21 capsule; Refill: 0 ? ?2. High anterior hairline ?Referral placed at patient request for trouble growing hair at anterior hairline.  ?- Ambulatory referral to Dermatology ? ? ?Return if symptoms worsen or fail  to improve. ? ?Terrilyn Saver, NP ? ? ?

## 2021-10-09 ENCOUNTER — Telehealth: Payer: Self-pay | Admitting: Family

## 2021-10-09 NOTE — Telephone Encounter (Signed)
[facility]) called stating that they needed a little more insight into the Diagnosis Information that was on the referral sent to them regarding the pt. Referral was sent in by [facility]. ?

## 2021-10-10 ENCOUNTER — Encounter: Payer: Self-pay | Admitting: Family Medicine

## 2021-10-10 ENCOUNTER — Other Ambulatory Visit: Payer: Self-pay | Admitting: Family Medicine

## 2021-10-10 DIAGNOSIS — R519 Headache, unspecified: Secondary | ICD-10-CM

## 2021-10-10 MED ORDER — VALACYCLOVIR HCL 1 G PO TABS: 1000.0000 mg | ORAL_TABLET | Freq: Three times a day (TID) | ORAL | 0 refills | Status: AC

## 2021-10-11 NOTE — Telephone Encounter (Signed)
Myra from [facility] called for more information on dx done by taylor  ? ?Please call direct telephone # [phone]  ?

## 2021-10-12 NOTE — Telephone Encounter (Signed)
LMOM for a return call.  

## 2021-11-05 ENCOUNTER — Ambulatory Visit: Payer: BC Managed Care – PPO | Admitting: Family

## 2021-11-05 VITALS — BP 126/58 | HR 73 | Temp 98.0°F | Resp 16 | Wt 239.0 lb

## 2021-11-05 DIAGNOSIS — R809 Proteinuria, unspecified: Secondary | ICD-10-CM

## 2021-11-05 DIAGNOSIS — I10 Essential (primary) hypertension: Secondary | ICD-10-CM

## 2021-11-05 DIAGNOSIS — F419 Anxiety disorder, unspecified: Secondary | ICD-10-CM | POA: Diagnosis not present

## 2021-11-05 DIAGNOSIS — E785 Hyperlipidemia, unspecified: Secondary | ICD-10-CM

## 2021-11-05 DIAGNOSIS — E1129 Type 2 diabetes mellitus with other diabetic kidney complication: Secondary | ICD-10-CM | POA: Diagnosis not present

## 2021-11-05 DIAGNOSIS — K146 Glossodynia: Secondary | ICD-10-CM | POA: Diagnosis not present

## 2021-11-05 DIAGNOSIS — L659 Nonscarring hair loss, unspecified: Secondary | ICD-10-CM

## 2021-11-05 DIAGNOSIS — F32A Depression, unspecified: Secondary | ICD-10-CM | POA: Insufficient documentation

## 2021-11-05 LAB — B12 AND FOLATE PANEL
Folate: >24.2 ng/mL (ref >5.9)
Vitamin B-12: 1266 pg/mL — ABNORMAL HIGH (ref 211–911)

## 2021-11-05 MED ORDER — LISINOPRIL 2.5 MG PO TABS
2.5000 mg | ORAL_TABLET | Freq: Every day | ORAL | 1 refills | Status: DC
Start: 2021-11-05 — End: 2022-05-15

## 2021-11-05 NOTE — Assessment & Plan Note (Signed)
She has an appointment with dermatology tomorrow.

## 2021-11-05 NOTE — Patient Instructions (Signed)
Please complete lab work prior to leaving.   

## 2021-11-05 NOTE — Assessment & Plan Note (Addendum)
Lab Results  Component Value Date   HGBA1C 7.5 (H) 04/17/2021   HGBA1C 6.7 (H) 10/04/2020   HGBA1C 6.6 (H) 06/05/2020   Lab Results  Component Value Date   MICROALBUR 3.7 (H) 09/25/2020   LDLCALC 91 04/17/2021   CREATININE 0.81 04/17/2021   Plan a1c next visit. She did not wish to begin ozempic at this time. She never started lisinopril. She is willing to start.

## 2021-11-05 NOTE — Assessment & Plan Note (Addendum)
Lab Results  Component Value Date   CHOL 162 04/17/2021   HDL 45.90 04/17/2021   LDLCALC 91 04/17/2021   LDLDIRECT 174.0 05/25/2019   TRIG 126.0 04/17/2021   CHOLHDL 4 04/17/2021   LDL at goal. Continue livalo '4mg'$ .

## 2021-11-05 NOTE — Progress Notes (Signed)
Subjective:   By signing my name below, I, [name], attest that this documentation has been prepared under the direction and in the presence of [name], NP 11/05/2021    Patient ID: [name], female    DOB: 25-Jun-1955, 66 y.o.   MRN: [id number]  Chief Complaint  Patient presents with   Oral Pain    Complains of oral pain and burning sensation   Hair/Scalp Problem    Complains of "sore spots on scalp, painful"    Oral Pain   Patient is in today for a follow up visit.   Scalp sores- She complains of painful sores on her scalp. She has hair loss since her symptoms started. She thinks it it shingles. She has both shingles vaccines. She also notes having more stress in her daily life and thinks it may be contributing to her symptoms. She has an upcomming appointment with a dermatologist tomorrow.  Anxiety/Depression- She complains of anxiety at her workplace. She finds when her anxiety presents she starts experiencing a burning sensation in her mouth. She denies having any numbness and tingling in her arms or legs. She also complains of depression from her workplace. She is planning on retiring but cannot this year due to financial reasons.  Sleep- She has an upcomming appointment with another provider to manage her sleep.  Knee pain- She has an upcomming appointment with another provider to manage her knee pain. She is considering knee replacement surgery. She thinks she is sitting most of the day due to her job. She continues taking tylenol when her knee pain worsens.  FMLA- She is requesting FMLA for her workplace for her potential knee surgery and her other symptoms.  Diabetes- She complains of occasional lightheadedness. She thinks her it may be her blood sugar. She continues taking [place] regularly.  Lab Results  Component Value Date   HGBA1C 7.5 (H) 04/17/2021   Blood pressure- Her blood pressure is doing ok during this visit. She reports never taking  lisinopril due to not being informed it was added to her medication list. She continues taking 25 mg hydrochlorothiazide daily PO and reports no new issues while taking it.  BP Readings from Last 3 Encounters:  11/05/21 (!) 126/58  10/05/21 128/70  05/15/21 126/71   Pulse Readings from Last 3 Encounters:  11/05/21 73  10/05/21 72  05/15/21 70    Health Maintenance Due  Topic Date Due   MAMMOGRAM  12/01/2020   FOOT EXAM  02/17/2021   HEMOGLOBIN A1C  10/15/2021    Past Medical History:  Diagnosis Date   Arthritis    Back pain    Benign fundic gland polyps of stomach    Constipation    Diabetes mellitus without complication (HCC)    Esophagitis    LA Class B   Fatigue    Fatty liver    Fatty liver    Fibromyalgia    Gallbladder problem    GERD (gastroesophageal reflux disease)    Heart murmur    Hyperlipidemia    Hypertension    IBS (irritable bowel syndrome)    Joint pain    Neuromuscular disorder ([place])    Prediabetes     Past Surgical History:  Procedure Laterality Date   CHOLECYSTECTOMY  2009   low GB EF, no gallstones   COLONOSCOPY     LAPAROSCOPY     of adhesions/ of ovaries   UPPER GASTROINTESTINAL ENDOSCOPY     VAGINAL HYSTERECTOMY  Family History  Problem Relation Age of Onset   Colon cancer Father    Hypertension Father    Hyperlipidemia Father    Obesity Father    Heart disease Mother    Hypertension Mother    Hyperlipidemia Mother    Obesity Mother    Diabetes Brother        28   Heart disease Brother    Diabetes Sister    Breast cancer Paternal Grandmother    Esophageal cancer Neg Hx    Stomach cancer Neg Hx    Rectal cancer Neg Hx     Social History   Socioeconomic History   Marital status: Single    Spouse name: Not on file   Number of children: Not on file   Years of education: Not on file   Highest education level: Not on file  Occupational History   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  Tobacco Use    Smoking status: Former    Packs/day: 0.10    Years: 10.00    Pack years: 1.00    Types: Cigarettes    Quit date: 07/14/1981    Years since quitting: 40.3   Smokeless tobacco: Never  Vaping Use   Vaping Use: Never used  Substance and Sexual Activity   Alcohol use: No   Drug use: No   Sexual activity: Not Currently  Other Topics Concern   Not on file  Social History Narrative   Widowed- husband passed last year   2 grown sons, grandchildren   Works for a Museum/gallery curator, Engineer, maintenance (IT)   No pets   Enjoys writing, reading, being a grandmother, walking   Engineer, 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     cholecalciferol (VITAMIN D3) 25 MCG (1000 UNIT) tablet Take 1,000 Units by mouth daily.     diphenhydrAMINE-nystatin-hydrocortisone Swish and spit 5 mls 4 times daily as needed for mouth pain as directed. 240 mL 1   fexofenadine (ALLEGRA) 60 MG tablet Take 60 mg by mouth 2 (two) times daily.     Glucosamine-Chondroitin (GLUCOSAMINE CHONDR COMPLEX PO) Take 2 capsules by mouth daily.     hydrochlorothiazide (HYDRODIURIL) 25 MG tablet Take 1 tablet (25 mg total) by mouth daily. 90 tablet 1   linaclotide (LINZESS) 290 MCG CAPS capsule Take 1 capsule (290 mcg total) by mouth daily before breakfast. 90 capsule 3   LIVALO 4 MG TABS TAKE 1 TABLET(4 MG) BY MOUTH DAILY 30 tablet 5   Multiple Vitamin (MULTIVITAMIN WITH MINERALS) TABS tablet Take 1 tablet by mouth daily.     pantoprazole (PROTONIX) 40 MG tablet Take 1 tablet (40 mg total) by  mouth 2 (two) times daily. 180 tablet 3   Saccharomyces boulardii (PROBIOTIC) 250 MG CAPS Take by mouth.     sitaGLIPtin (JANUVIA) 100 MG tablet TAKE 1 TABLET(100 MG) BY MOUTH DAILY 30 tablet 5   timolol (TIMOPTIC) 0.5 % ophthalmic solution Place 1 drop into the left eye 2 (two) times daily.  zinc gluconate 50 MG tablet Take 50 mg by mouth daily.     COVID-19 mRNA bivalent vaccine, Moderna, (MODERNA COVID-19 BIVAL BOOSTER) 50 MCG/0.5ML injection Inject into the muscle. 0.5 mL 0   lidocaine (LIDODERM) 5 % Place 1 patch onto the skin daily. Remove & Discard patch within 12 hours or as directed by MD. 90 patch 1   Semaglutide-Weight Management 0.5 MG/0.5ML SOAJ Inject 0.5 mg into the skin.     albuterol (VENTOLIN HFA) 108 (90 Base) MCG/ACT inhaler      azelastine (ASTELIN) 0.1 % nasal spray Place 1 spray into both nostrils 2 (two) times daily.     DULoxetine (CYMBALTA) 60 MG capsule Take 1 capsule (60 mg total) by mouth daily. 90 capsule 1   lisinopril (ZESTRIL) 2.5 MG tablet Take 1 tablet (2.5 mg total) by mouth daily. 90 tablet 1   No facility-administered medications prior to visit.    Allergies  Allergen Reactions   Crestor [Rosuvastatin Calcium]     Muscle aches   Metformin And Related     abd pain    Review of Systems  Musculoskeletal:        (+)knee pain  Skin:        (+)sores on scalp (+)hair loss  Psychiatric/Behavioral:  Positive for depression. The patient is nervous/anxious.       Objective:    Physical Exam Constitutional:      General: She is not in acute distress.    Appearance: Normal appearance. She is not ill-appearing.  HENT:     Head: Normocephalic and atraumatic.     Right Ear: External ear normal.     Left Ear: External ear normal.  Eyes:     Extraocular Movements: Extraocular movements intact.     Pupils: Pupils are equal, round, and reactive to light.  Cardiovascular:     Rate and Rhythm: Normal rate and regular rhythm.     Heart sounds: Normal  heart sounds. No murmur heard.   No gallop.  Pulmonary:     Effort: Pulmonary effort is normal. No respiratory distress.     Breath sounds: Normal breath sounds. No wheezing or rales.  Skin:    General: Skin is warm and dry.     Comments: Multiple bare patches on scalp  Neurological:     Mental Status: She is alert and oriented to person, place, and time.  Psychiatric:        Mood and Affect: Affect is tearful.        Judgment: Judgment normal.    BP (!) 126/58 (BP Location: Right Arm, Patient Position: Sitting, Cuff Size: Large)   Pulse 73   Temp 98 F (36.7 C) (Oral)   Resp 16   Wt 239 lb (108.4 kg)   SpO2 100%   BMI 36.34 kg/m  Wt Readings from Last 3 Encounters:  11/05/21 239 lb (108.4 kg)  10/05/21 240 lb (108.9 kg)  05/15/21 241 lb (109.3 kg)       Assessment & Plan:   Problem List Items Addressed This Visit       Unprioritized   HYPERTENSION, BENIGN ESSENTIAL    BP Readings from Last 3 Encounters:  11/05/21 (!) 126/58  10/05/21 128/70  05/15/21 126/71  BP looks great.  Continue       Relevant Medications   lisinopril (ZESTRIL) 2.5 MG tablet   Hyperlipidemia with target LDL less than 100    Lab Results  Component Value Date   CHOL 162 04/17/2021  HDL 45.90 04/17/2021   LDLCALC 91 04/17/2021   LDLDIRECT 174.0 05/25/2019   TRIG 126.0 04/17/2021   CHOLHDL 4 04/17/2021  LDL at goal. Continue livalo '4mg'$ .       Relevant Medications   lisinopril (ZESTRIL) 2.5 MG tablet   Controlled type 2 diabetes mellitus with microalbuminuria, without long-term current use of insulin (HCC)    Lab Results  Component Value Date   HGBA1C 7.5 (H) 04/17/2021   HGBA1C 6.7 (H) 10/04/2020   HGBA1C 6.6 (H) 06/05/2020   Lab Results  Component Value Date   MICROALBUR 3.7 (H) 09/25/2020   LDLCALC 91 04/17/2021   CREATININE 0.81 04/17/2021  Plan a1c next visit. She did not wish to begin ozempic at this time. She never started lisinopril. She is willing to start.        Relevant Medications   lisinopril (ZESTRIL) 2.5 MG tablet   [place]    She has an appointment with dermatology tomorrow.        Anxiety and depression    Largely situational and due to work stress.  I have recommended that she start some counseling and also take a leave of absence from her job.  She will bring me her FMLA paperwork to fill out. She will continue current dose of cymbalta.        Other Visit Diagnoses     Tongue pain    -  Primary   Relevant Orders   B12 and Folate Panel        Meds ordered this encounter  Medications   lisinopril (ZESTRIL) 2.5 MG tablet    Sig: Take 1 tablet (2.5 mg total) by mouth daily.    Dispense:  90 tablet    Refill:  1    Order Specific Question:   Supervising Provider    Answer:   Penni Homans A [[id number]]    I, Nance Pear, NP, personally preformed the services described in this documentation.  All medical record entries made by the scribe were at my direction and in my presence.  I have reviewed the chart and discharge instructions (if applicable) and agree that the record reflects my personal performance and is accurate and complete. 11/05/2021   I,[name],acting as a scribe for Nance Pear, NP.,have documented all relevant documentation on the behalf of Nance Pear, NP,as directed by  Nance Pear, NP while in the presence of Nance Pear, NP.   Nance Pear, NP

## 2021-11-05 NOTE — Assessment & Plan Note (Addendum)
BP Readings from Last 3 Encounters:  11/05/21 (!) 126/58  10/05/21 128/70  05/15/21 126/71   BP looks great.  Continue

## 2021-11-05 NOTE — Assessment & Plan Note (Addendum)
Largely situational and due to work stress.  I have recommended that she start some counseling and also take a leave of absence from her job.  She will bring me her FMLA paperwork to fill out. She will continue current dose of cymbalta.

## 2021-11-06 ENCOUNTER — Telehealth: Payer: Self-pay | Admitting: Family

## 2021-11-06 DIAGNOSIS — M25561 Pain in right knee: Secondary | ICD-10-CM | POA: Diagnosis not present

## 2021-11-06 DIAGNOSIS — M5459 Other low back pain: Secondary | ICD-10-CM | POA: Diagnosis not present

## 2021-11-06 NOTE — Telephone Encounter (Signed)
Paper work dropped off  Placed in [place] bin up front  Patient would like to be called when its ready to pick up

## 2021-11-07 NOTE — Telephone Encounter (Signed)
Form started and placed in provider's folder 

## 2021-11-08 DIAGNOSIS — R2689 Other abnormalities of gait and mobility: Secondary | ICD-10-CM | POA: Diagnosis not present

## 2021-11-08 DIAGNOSIS — M25561 Pain in right knee: Secondary | ICD-10-CM | POA: Diagnosis not present

## 2021-11-08 DIAGNOSIS — M5459 Other low back pain: Secondary | ICD-10-CM | POA: Diagnosis not present

## 2021-11-08 DIAGNOSIS — M6281 Muscle weakness (generalized): Secondary | ICD-10-CM | POA: Diagnosis not present

## 2021-11-09 DIAGNOSIS — L669 Cicatricial alopecia, unspecified: Secondary | ICD-10-CM | POA: Diagnosis not present

## 2021-11-09 DIAGNOSIS — R208 Other disturbances of skin sensation: Secondary | ICD-10-CM | POA: Diagnosis not present

## 2021-11-09 DIAGNOSIS — D2361 Other benign neoplasm of skin of right upper limb, including shoulder: Secondary | ICD-10-CM | POA: Diagnosis not present

## 2021-11-09 DIAGNOSIS — K146 Glossodynia: Secondary | ICD-10-CM | POA: Diagnosis not present

## 2021-11-12 NOTE — Telephone Encounter (Signed)
Patient called for update. Patient stated paper work is due this week

## 2021-11-13 DIAGNOSIS — Z0279 Encounter for issue of other medical certificate: Secondary | ICD-10-CM

## 2021-11-13 NOTE — Telephone Encounter (Signed)
Patient advised for ready and copy at front for pick up

## 2021-11-14 DIAGNOSIS — M5459 Other low back pain: Secondary | ICD-10-CM | POA: Diagnosis not present

## 2021-11-14 DIAGNOSIS — M25561 Pain in right knee: Secondary | ICD-10-CM | POA: Diagnosis not present

## 2021-11-14 DIAGNOSIS — R2689 Other abnormalities of gait and mobility: Secondary | ICD-10-CM | POA: Diagnosis not present

## 2021-11-14 DIAGNOSIS — M6281 Muscle weakness (generalized): Secondary | ICD-10-CM | POA: Diagnosis not present

## 2021-11-16 DIAGNOSIS — M6281 Muscle weakness (generalized): Secondary | ICD-10-CM | POA: Diagnosis not present

## 2021-11-16 DIAGNOSIS — M5459 Other low back pain: Secondary | ICD-10-CM | POA: Diagnosis not present

## 2021-11-16 DIAGNOSIS — R2689 Other abnormalities of gait and mobility: Secondary | ICD-10-CM | POA: Diagnosis not present

## 2021-11-16 DIAGNOSIS — M25561 Pain in right knee: Secondary | ICD-10-CM | POA: Diagnosis not present

## 2021-11-18 DIAGNOSIS — M25561 Pain in right knee: Secondary | ICD-10-CM | POA: Diagnosis not present

## 2021-11-18 DIAGNOSIS — M5451 Vertebrogenic low back pain: Secondary | ICD-10-CM | POA: Diagnosis not present

## 2021-11-20 DIAGNOSIS — M6281 Muscle weakness (generalized): Secondary | ICD-10-CM | POA: Diagnosis not present

## 2021-11-20 DIAGNOSIS — M5459 Other low back pain: Secondary | ICD-10-CM | POA: Diagnosis not present

## 2021-11-20 DIAGNOSIS — R2689 Other abnormalities of gait and mobility: Secondary | ICD-10-CM | POA: Diagnosis not present

## 2021-11-20 DIAGNOSIS — M25561 Pain in right knee: Secondary | ICD-10-CM | POA: Diagnosis not present

## 2021-11-23 DIAGNOSIS — R2689 Other abnormalities of gait and mobility: Secondary | ICD-10-CM | POA: Diagnosis not present

## 2021-11-23 DIAGNOSIS — M25561 Pain in right knee: Secondary | ICD-10-CM | POA: Diagnosis not present

## 2021-11-23 DIAGNOSIS — M5459 Other low back pain: Secondary | ICD-10-CM | POA: Diagnosis not present

## 2021-11-23 DIAGNOSIS — M6281 Muscle weakness (generalized): Secondary | ICD-10-CM | POA: Diagnosis not present

## 2021-11-23 DIAGNOSIS — M5451 Vertebrogenic low back pain: Secondary | ICD-10-CM | POA: Diagnosis not present

## 2021-11-26 ENCOUNTER — Ambulatory Visit (INDEPENDENT_AMBULATORY_CARE_PROVIDER_SITE_OTHER): Payer: BC Managed Care – PPO | Admitting: Family

## 2021-11-26 VITALS — BP 137/90 | HR 68 | Temp 98.4°F | Resp 16 | Wt 238.0 lb

## 2021-11-26 DIAGNOSIS — E1129 Type 2 diabetes mellitus with other diabetic kidney complication: Secondary | ICD-10-CM

## 2021-11-26 DIAGNOSIS — F419 Anxiety disorder, unspecified: Secondary | ICD-10-CM

## 2021-11-26 DIAGNOSIS — F32A Depression, unspecified: Secondary | ICD-10-CM | POA: Diagnosis not present

## 2021-11-26 DIAGNOSIS — R809 Proteinuria, unspecified: Secondary | ICD-10-CM | POA: Diagnosis not present

## 2021-11-26 LAB — BASIC METABOLIC PANEL
BUN: 13 mg/dL (ref 6–23)
CO2: 31 mEq/L (ref 19–32)
Calcium: 9.9 mg/dL (ref 8.4–10.5)
Chloride: 99 mEq/L (ref 96–112)
Creatinine, Ser: 0.79 mg/dL (ref 0.40–1.20)
GFR: 77.63 mL/min (ref >60.00)
Glucose, Bld: 120 mg/dL — ABNORMAL HIGH (ref 70–99)
Potassium: 4 mEq/L (ref 3.5–5.1)
Sodium: 138 mEq/L (ref 135–145)

## 2021-11-26 LAB — HEMOGLOBIN A1C: Hgb A1c MFr Bld: 6.8 % — ABNORMAL HIGH (ref 4.6–6.5)
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
## 2021-11-30 DIAGNOSIS — M5459 Other low back pain: Secondary | ICD-10-CM | POA: Diagnosis not present

## 2021-11-30 DIAGNOSIS — R2689 Other abnormalities of gait and mobility: Secondary | ICD-10-CM | POA: Diagnosis not present

## 2021-11-30 DIAGNOSIS — M6281 Muscle weakness (generalized): Secondary | ICD-10-CM | POA: Diagnosis not present

## 2021-11-30 DIAGNOSIS — Z0279 Encounter for issue of other medical certificate: Secondary | ICD-10-CM

## 2021-11-30 DIAGNOSIS — M25561 Pain in right knee: Secondary | ICD-10-CM | POA: Diagnosis not present

## 2021-12-11 ENCOUNTER — Telehealth: Payer: Self-pay | Admitting: Family

## 2021-12-11 ENCOUNTER — Encounter: Payer: Self-pay | Admitting: *Deleted

## 2021-12-11 NOTE — Telephone Encounter (Signed)
STD forms faxed into front office Placed in MO bin

## 2021-12-11 NOTE — Telephone Encounter (Signed)
Form placed in provider folder for completion

## 2021-12-12 NOTE — Telephone Encounter (Signed)
Please contact pt to schedule a medical clearance office visit with me. It looks like Dr. Tonita Cong needs paperwork/clearance for her upcoming surgery.

## 2021-12-12 NOTE — Telephone Encounter (Signed)
Patient was scheduled to come in 12-17-21

## 2021-12-17 ENCOUNTER — Ambulatory Visit: Payer: BC Managed Care – PPO | Admitting: Family

## 2021-12-17 ENCOUNTER — Ambulatory Visit (HOSPITAL_BASED_OUTPATIENT_CLINIC_OR_DEPARTMENT_OTHER)
Admission: RE | Admit: 2021-12-17 | Discharge: 2021-12-17 | Disposition: A | Discharge status: Home / Self Care | Payer: BC Managed Care – PPO | Source: Ambulatory Visit | Attending: Family | Admitting: Family

## 2021-12-17 VITALS — BP 128/71 | HR 67 | Temp 98.5°F | Resp 16 | Wt 239.0 lb

## 2021-12-17 DIAGNOSIS — F32A Depression, unspecified: Secondary | ICD-10-CM | POA: Diagnosis not present

## 2021-12-17 DIAGNOSIS — F419 Anxiety disorder, unspecified: Secondary | ICD-10-CM | POA: Diagnosis not present

## 2021-12-17 DIAGNOSIS — Z01818 Encounter for other preprocedural examination: Secondary | ICD-10-CM

## 2021-12-17 LAB — COMPREHENSIVE METABOLIC PANEL
ALT: 25 U/L (ref 0–35)
AST: 10 U/L (ref 0–37)
Albumin: 4.4 g/dL (ref 3.5–5.2)
Alkaline Phosphatase: 58 U/L (ref 39–117)
BUN: 14 mg/dL (ref 6–23)
CO2: 31 mEq/L (ref 19–32)
Calcium: 9.8 mg/dL (ref 8.4–10.5)
Chloride: 102 mEq/L (ref 96–112)
Creatinine, Ser: 0.81 mg/dL (ref 0.40–1.20)
GFR: 75.31 mL/min (ref >60.00)
Glucose, Bld: 117 mg/dL — ABNORMAL HIGH (ref 70–99)
Potassium: 4.1 mEq/L (ref 3.5–5.1)
Sodium: 141 mEq/L (ref 135–145)
Total Bilirubin: 0.6 mg/dL (ref 0.2–1.2)
Total Protein: 7.3 g/dL (ref 6.0–8.3)

## 2021-12-17 LAB — CBC WITH DIFFERENTIAL/PLATELET
Basophils Absolute: 0 10*3/uL (ref 0.0–0.1)
Basophils Relative: 0.3 % (ref 0.0–3.0)
Eosinophils Absolute: 0 10*3/uL (ref 0.0–0.7)
Eosinophils Relative: 0.7 % (ref 0.0–5.0)
HCT: 39.3 % (ref 36.0–46.0)
Hemoglobin: 13.6 g/dL (ref 12.0–15.0)
Lymphocytes Relative: 42.7 % (ref 12.0–46.0)
Lymphs Abs: 2.3 10*3/uL (ref 0.7–4.0)
MCHC: 34.6 g/dL (ref 30.0–36.0)
MCV: 88.6 fl (ref 78.0–100.0)
Monocytes Absolute: 0.7 10*3/uL (ref 0.1–1.0)
Monocytes Relative: 13.5 % — ABNORMAL HIGH (ref 3.0–12.0)
Neutro Abs: 2.3 10*3/uL (ref 1.4–7.7)
Neutrophils Relative %: 42.8 % — ABNORMAL LOW (ref 43.0–77.0)
Platelets: 196 10*3/uL (ref 150.0–400.0)
RBC: 4.43 Mil/uL (ref 3.87–5.11)
RDW: 16.1 % — ABNORMAL HIGH (ref 11.5–15.5)
WBC: 5.4 10*3/uL (ref 4.0–10.5)

## 2021-12-17 LAB — PROTIME-INR
INR: 0.9 ratio (ref 0.8–1.0)
Prothrombin Time: 9.9 s (ref 9.6–13.1)

## 2021-12-17 NOTE — Assessment & Plan Note (Signed)
EKG tracing is personally reviewed.  EKG notes NSR.  No acute changes.   Check CXR and labs as below.

## 2021-12-17 NOTE — Progress Notes (Signed)
Subjective:     Patient ID: [name], female    DOB: 06-Jul-1955, 66 y.o.   MRN: [id number]  Chief Complaint  Patient presents with   Pre-op Exam    Here for exam prior to right knee surgery    HPI  Anxiety/depression- reached out to her EAP but did not receive a call back. She has not started any counseling as a result. She feels like her anxiety/depression symptoms are stable enough for her to return to work on 12/31/21.   She has been going to PT and the gym.  This has been good for her.  She needs pre-op clearance for planned Right knee arthroscopy, date to be determined. Will be done with Dr. Tonita Cong   Health Maintenance Due  Topic Date Due   MAMMOGRAM  12/01/2020   FOOT EXAM  02/17/2021   COVID-19 Vaccine (5 - Moderna series) 09/13/2021   TETANUS/TDAP  11/07/2021    Past Medical History:  Diagnosis Date   Arthritis    Back pain    Benign fundic gland polyps of stomach    Constipation    Diabetes mellitus without complication (HCC)    Esophagitis    LA Class B   Fatigue    Fatty liver    Fatty liver    Fibromyalgia    Gallbladder problem    GERD (gastroesophageal reflux disease)    Heart murmur    Hyperlipidemia    Hypertension    IBS (irritable bowel syndrome)    Joint pain    Neuromuscular disorder ([place])    Prediabetes     Past Surgical History:  Procedure Laterality Date   CHOLECYSTECTOMY  2009   low GB EF, no gallstones   COLONOSCOPY     LAPAROSCOPY     of adhesions/ of ovaries   UPPER GASTROINTESTINAL ENDOSCOPY     VAGINAL HYSTERECTOMY      Family History  Problem Relation Age of Onset   Colon cancer Father    Hypertension Father    Hyperlipidemia Father    Obesity Father    Heart disease Mother    Hypertension Mother    Hyperlipidemia Mother    Obesity Mother    Diabetes Brother        68   Heart disease Brother    Diabetes Sister    Breast cancer Paternal Grandmother    Esophageal cancer Neg Hx    Stomach cancer Neg Hx     Rectal cancer Neg Hx     Social History   Socioeconomic History   Marital status: Single    Spouse name: Not on file   Number of children: Not on file   Years of education: Not on file   Highest education level: Not on file  Occupational History   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  Tobacco Use   Smoking status: Former    Packs/day: 0.10    Years: 10.00    Total pack years: 1.00    Types: Cigarettes    Quit date: 07/14/1981    Years since quitting: 40.4   Smokeless tobacco: Never  Vaping Use   Vaping Use: Never used  Substance and Sexual Activity   Alcohol use: No   Drug use: No   Sexual activity: Not Currently  Other Topics Concern   Not on file  Social History Narrative   Widowed- husband passed last year   2 grown sons, grandchildren   Works for a Museum/gallery curator, Museum/gallery curator  services officer   No pets   Enjoys writing, reading, being a grandmother, walking   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     cholecalciferol (VITAMIN D3) 25 MCG (1000 UNIT) tablet Take 1,000 Units by mouth daily.     diphenhydrAMINE-nystatin-hydrocortisone Swish and spit 5 mls 4 times daily as needed for mouth pain as directed. 240 mL 1   fexofenadine (ALLEGRA) 60 MG tablet Take 60 mg by mouth 2 (two) times daily.     Fluocinolone Acetonide Body 0.01 % OIL Apply topically 3 (three) times a week.     Glucosamine-Chondroitin (GLUCOSAMINE CHONDR COMPLEX PO) Take 2 capsules by mouth daily.     hydrochlorothiazide (HYDRODIURIL) 25 MG tablet  Take 1 tablet (25 mg total) by mouth daily. 90 tablet 1   linaclotide (LINZESS) 290 MCG CAPS capsule Take 1 capsule (290 mcg total) by mouth daily before breakfast. 90 capsule 3   lisinopril (ZESTRIL) 2.5 MG tablet Take 1 tablet (2.5 mg total) by mouth daily. 90 tablet 1   LIVALO 4 MG TABS TAKE 1 TABLET(4 MG) BY MOUTH DAILY 30 tablet 5   Multiple Vitamin (MULTIVITAMIN WITH MINERALS) TABS tablet Take 1 tablet by mouth daily.     pantoprazole (PROTONIX) 40 MG tablet Take 1 tablet (40 mg total) by mouth 2 (two) times daily. 180 tablet 3   pramipexole (MIRAPEX) 0.75 MG tablet      Saccharomyces boulardii (PROBIOTIC) 250 MG CAPS Take by mouth.     sitaGLIPtin (JANUVIA) 100 MG tablet TAKE 1 TABLET(100 MG) BY MOUTH DAILY 30 tablet 5   timolol (TIMOPTIC) 0.5 % ophthalmic solution Place 1 drop into the left eye 2 (two) times daily.     zinc gluconate 50 MG tablet Take 50 mg by mouth daily.     No facility-administered medications prior to visit.    Allergies  Allergen Reactions   Crestor [Rosuvastatin Calcium]     Muscle aches   Metformin And Related     abd pain    Review of Systems  Constitutional:  Negative for weight loss.  HENT:  Negative for congestion.   Eyes:  Positive for blurred vision (right eye).  Respiratory:  Positive for cough (attributes to allergies). Negative for shortness of breath.   Cardiovascular:  Negative for chest pain and leg swelling.  Genitourinary:  Negative for dysuria and frequency.  Musculoskeletal:  Positive for back pain and joint pain.  Skin:  Negative for rash.  Neurological:  Negative for headaches.       Objective:    Physical Exam Constitutional:      General: She is not in acute distress.    Appearance: Normal appearance. She is well-developed.  HENT:     Head: Normocephalic and atraumatic.     Right Ear: External ear normal.     Left Ear: External ear normal.  Eyes:     General: No scleral icterus. Neck:     Thyroid: No thyromegaly.   Cardiovascular:     Rate  and Rhythm: Normal rate and regular rhythm.     Heart sounds: Normal heart sounds. No murmur heard. Pulmonary:     Effort: Pulmonary effort is normal. No respiratory distress.     Breath sounds: Normal breath sounds. No wheezing.  Musculoskeletal:     Cervical back: Neck supple.  Skin:    General: Skin is warm and dry.  Neurological:     Mental Status: She is alert and oriented to person, place, and time.  Psychiatric:        Mood and Affect: Mood normal.        Behavior: Behavior normal.        Thought Content: Thought content normal.        Judgment: Judgment normal.     BP 128/71 (BP Location: Right Arm, Patient Position: Sitting, Cuff Size: Large)   Pulse 67   Temp 98.5 F (36.9 C) (Oral)   Resp 16   Wt 239 lb (108.4 kg)   SpO2 100%   BMI 36.34 kg/m  Wt Readings from Last 3 Encounters:  12/17/21 239 lb (108.4 kg)  11/26/21 238 lb (108 kg)  11/05/21 239 lb (108.4 kg)       Assessment & Plan:   Problem List Items Addressed This Visit       Unprioritized   Preoperative evaluation to rule out surgical contraindication - Primary    EKG tracing is personally reviewed.  EKG notes NSR.  No acute changes.   Check CXR and labs as below.       Relevant Orders   Comp Met (CMET)   CBC with Differential/Platelet   DG Chest 2 View   EKG 12-Lead (Completed)   Protime-INR   Anxiety and depression    Showing some improvement. I wrote her a clearance letter to return to work on 12/31/21.       I am having [name]. Pilar maintain her fexofenadine, Probiotic, cholecalciferol, vitamin C, zinc gluconate, multivitamin with minerals, timolol, Glucosamine-Chondroitin (GLUCOSAMINE CHONDR COMPLEX PO), linaclotide, pantoprazole, Livalo, hydrochlorothiazide, sitaGLIPtin, diphenhydrAMINE-nystatin-hydrocortisone, lisinopril, Fluocinolone Acetonide Body, and pramipexole.  No orders of the defined types were placed in this encounter.

## 2021-12-17 NOTE — Assessment & Plan Note (Signed)
Showing some improvement. I wrote her a clearance letter to return to work on 12/31/21.

## 2021-12-17 NOTE — Patient Instructions (Addendum)
Please complete lab work prior to leaving. Complete x-ray on the first floor.  

## 2021-12-19 DIAGNOSIS — H401131 Primary open-angle glaucoma, bilateral, mild stage: Secondary | ICD-10-CM | POA: Diagnosis not present

## 2021-12-29 DIAGNOSIS — M5416 Radiculopathy, lumbar region: Secondary | ICD-10-CM | POA: Diagnosis not present

## 2022-01-02 ENCOUNTER — Other Ambulatory Visit: Payer: Self-pay | Admitting: Family

## 2022-01-05 ENCOUNTER — Other Ambulatory Visit: Payer: Self-pay | Admitting: Gastroenterology

## 2022-01-05 ENCOUNTER — Other Ambulatory Visit: Payer: Self-pay | Admitting: Family

## 2022-01-09 ENCOUNTER — Encounter: Payer: Self-pay | Admitting: Gastroenterology

## 2022-02-18 ENCOUNTER — Encounter: Payer: Self-pay | Admitting: Gastroenterology

## 2022-02-27 DIAGNOSIS — M5451 Vertebrogenic low back pain: Secondary | ICD-10-CM | POA: Diagnosis not present

## 2022-02-27 DIAGNOSIS — M25561 Pain in right knee: Secondary | ICD-10-CM | POA: Diagnosis not present

## 2022-02-28 ENCOUNTER — Ambulatory Visit (AMBULATORY_SURGERY_CENTER): Payer: Self-pay | Admitting: *Deleted

## 2022-02-28 ENCOUNTER — Other Ambulatory Visit (HOSPITAL_BASED_OUTPATIENT_CLINIC_OR_DEPARTMENT_OTHER): Payer: Self-pay

## 2022-02-28 VITALS — Ht 67.5 in | Wt 235.0 lb

## 2022-02-28 DIAGNOSIS — Z800 Family history of malignant neoplasm of digestive organs: Secondary | ICD-10-CM

## 2022-02-28 MED ORDER — NA SULFATE-K SULFATE-MG SULF 17.5-3.13-1.6 GM/177ML PO SOLN: 1.0000 | Freq: Once | ORAL | 0 refills | Status: AC

## 2022-02-28 NOTE — Progress Notes (Signed)
No egg or soy allergy known to patient  No issues known to pt with past sedation with any surgeries or procedures Patient denies ever being told they had issues or difficulty with intubation  No FH of Malignant Hyperthermia Pt is not on diet pills Pt is not on  home 02  Pt is not on blood thinners  Pt takes senkot to help with bowels constipation ,extra miralax given to take with prep. No A fib or A flutter Have any cardiac testing pending--NO Pt instructed to use Singlecare.com or GoodRx for a price reduction on prep

## 2022-03-08 DIAGNOSIS — K146 Glossodynia: Secondary | ICD-10-CM | POA: Diagnosis not present

## 2022-03-08 DIAGNOSIS — L669 Cicatricial alopecia, unspecified: Secondary | ICD-10-CM | POA: Diagnosis not present

## 2022-03-08 DIAGNOSIS — R208 Other disturbances of skin sensation: Secondary | ICD-10-CM | POA: Diagnosis not present

## 2022-03-23 ENCOUNTER — Encounter: Payer: Self-pay | Admitting: Certified Registered Nurse Anesthetist

## 2022-03-25 ENCOUNTER — Other Ambulatory Visit: Payer: Self-pay | Admitting: Family

## 2022-03-28 ENCOUNTER — Encounter: Payer: Self-pay | Admitting: Gastroenterology

## 2022-03-28 ENCOUNTER — Ambulatory Visit (AMBULATORY_SURGERY_CENTER): Payer: Medicare Other | Admitting: Gastroenterology

## 2022-03-28 VITALS — BP 125/79 | HR 68 | Temp 96.4°F | Resp 13 | Ht 67.5 in | Wt 235.0 lb

## 2022-03-28 DIAGNOSIS — F32A Depression, unspecified: Secondary | ICD-10-CM | POA: Diagnosis not present

## 2022-03-28 DIAGNOSIS — D123 Benign neoplasm of transverse colon: Secondary | ICD-10-CM

## 2022-03-28 DIAGNOSIS — Z800 Family history of malignant neoplasm of digestive organs: Secondary | ICD-10-CM | POA: Diagnosis not present

## 2022-03-28 DIAGNOSIS — I10 Essential (primary) hypertension: Secondary | ICD-10-CM | POA: Diagnosis not present

## 2022-03-28 DIAGNOSIS — Z1211 Encounter for screening for malignant neoplasm of colon: Secondary | ICD-10-CM

## 2022-03-28 DIAGNOSIS — Z87891 Personal history of nicotine dependence: Secondary | ICD-10-CM | POA: Diagnosis not present

## 2022-03-28 MED ORDER — SODIUM CHLORIDE 0.9 % IV SOLN: 500.0000 mL | INTRAVENOUS | Status: DC

## 2022-03-28 NOTE — Op Note (Signed)
[facility] Patient Name: [name] Procedure Date: 03/28/2022 11:11 AM MRN: [id number] Endoscopist: Ladene Artist , MD, [id number] Age: 66 Referring MD:  Date of Birth: 07-02-1955 Gender: Female Account #: [id number] Procedure:                Colonoscopy Indications:              Screening in patient at increased risk: Family                            history of 1st-degree relative with colorectal                            cancer Medicines:                Monitored Anesthesia Care Procedure:                Pre-Anesthesia Assessment:                           - Prior to the procedure, a History and Physical                            was performed, and patient medications and                            allergies were reviewed. The patient's tolerance of                            previous anesthesia was also reviewed. The risks                            and benefits of the procedure and the sedation                            options and risks were discussed with the patient.                            All questions were answered, and informed consent                            was obtained. Prior Anticoagulants: The patient has                            taken no anticoagulant or antiplatelet agents. ASA                            Grade Assessment: II - A patient with mild systemic                            disease. After reviewing the risks and benefits,                            the patient was deemed in satisfactory condition to  undergo the procedure.                           After obtaining informed consent, the colonoscope                            was passed under direct vision. Throughout the                            procedure, the patient's blood pressure, pulse, and                            oxygen saturations were monitored continuously. The                            Olympus CF-HQ190L [id number]) Colonoscope was                             introduced through the anus and advanced to the the                            cecum, identified by appendiceal orifice and                            ileocecal valve. The ileocecal valve, appendiceal                            orifice, and rectum were photographed. The quality                            of the bowel preparation was good. The colonoscopy                            was performed without difficulty. The patient                            tolerated the procedure well. Scope In: 11:18:01 AM Scope Out: 11:32:43 AM Scope Withdrawal Time: 0 hours 11 minutes 31 seconds  Total Procedure Duration: 0 hours 14 minutes 42 seconds  Findings:                 The perianal and digital rectal examinations were                            normal.                           A 7 mm polyp was found in the transverse colon. The                            polyp was sessile. The polyp was removed with a                            cold snare. Resection and retrieval were complete.  A diffuse area of mild to moderate melanosis was                            found in the entire colon, moderate in the right                            colon and mild in the left colon.                           The exam was otherwise without abnormality on                            direct and retroflexion views. Complications:            No immediate complications. Estimated blood loss:                            None. Estimated Blood Loss:     Estimated blood loss: none. Impression:               - One 7 mm polyp in the transverse colon, removed                            with a cold snare. Resected and retrieved.                           - Melanosis in the colon.                           - The examination was otherwise normal on direct                            and retroflexion views. Recommendation:           - Repeat colonoscopy in 5 years for surveillance.                            - Patient has a contact number available for                            emergencies. The signs and symptoms of potential                            delayed complications were discussed with the                            patient. Return to normal activities tomorrow.                            Written discharge instructions were provided to the                            patient.                           - Resume previous diet.                           -  Continue present medications.                           - Await pathology results. Ladene Artist, MD 03/28/2022 11:36:45 AM This report has been signed electronically.

## 2022-03-28 NOTE — Progress Notes (Signed)
Pt's states no medical or surgical changes since previsit or office visit. 

## 2022-03-28 NOTE — Patient Instructions (Signed)
Please read handouts provided. Continue present medications. Await pathology results. Repeat colonoscopy in 5 years for screening.   YOU HAD AN ENDOSCOPIC PROCEDURE TODAY AT THE [facility]:   Refer to the procedure report that was given to you for any specific questions about what was found during the examination.  If the procedure report does not answer your questions, please call your gastroenterologist to clarify.  If you requested that your care partner not be given the details of your procedure findings, then the procedure report has been included in a sealed envelope for you to review at your convenience later.  YOU SHOULD EXPECT: Some feelings of bloating in the abdomen. Passage of more gas than usual.  Walking can help get rid of the air that was put into your GI tract during the procedure and reduce the bloating. If you had a lower endoscopy (such as a colonoscopy or flexible sigmoidoscopy) you may notice spotting of blood in your stool or on the toilet paper. If you underwent a bowel prep for your procedure, you may not have a normal bowel movement for a few days.  Please Note:  You might notice some irritation and congestion in your nose or some drainage.  This is from the oxygen used during your procedure.  There is no need for concern and it should clear up in a day or so.  SYMPTOMS TO REPORT IMMEDIATELY:  Following lower endoscopy (colonoscopy or flexible sigmoidoscopy):  Excessive amounts of blood in the stool  Significant tenderness or worsening of abdominal pains  Swelling of the abdomen that is new, acute  Fever of 100F or higher  For urgent or emergent issues, a gastroenterologist can be reached at any hour by calling [phone]. Do not use MyChart messaging for urgent concerns.    DIET:  We do recommend a small meal at first, but then you may proceed to your regular diet.  Drink plenty of fluids but you should avoid alcoholic beverages for 24  hours.  ACTIVITY:  You should plan to take it easy for the rest of today and you should NOT DRIVE or use heavy machinery until tomorrow (because of the sedation medicines used during the test).    FOLLOW UP: Our staff will call the number listed on your records the next business day following your procedure.  We will call around 7:15- 8:00 am to check on you and address any questions or concerns that you may have regarding the information given to you following your procedure. If we do not reach you, we will leave a message.     If any biopsies were taken you will be contacted by phone or by letter within the next 1-3 weeks.  Please call us at [phone] if you have not heard about the biopsies in 3 weeks.    SIGNATURES/CONFIDENTIALITY: You and/or your care partner have signed paperwork which will be entered into your electronic medical record.  These signatures attest to the fact that that the information above on your After Visit Summary has been reviewed and is understood.  Full responsibility of the confidentiality of this discharge information lies with you and/or your care-partner. 

## 2022-03-28 NOTE — Progress Notes (Signed)
History & Physical  Primary Care Physician:  [name], NP Primary Gastroenterologist: Lucio Edward, MD  CHIEF COMPLAINT:  Family history of colon cancer  HPI: [name] is a 66 y.o. female with a family history of colon cancer, first-degree relative, for colonoscopy.   Past Medical History:  Diagnosis Date   Allergy    SEASONAL   Arthritis    Back pain    Benign fundic gland polyps of stomach    Cataract    EARL STAGES   Constipation    Diabetes mellitus without complication (HCC)    Esophagitis    LA Class B   Fatigue    Fatty liver    Fatty liver    Fibromyalgia    Gallbladder problem    GERD (gastroesophageal reflux disease)    Heart murmur    Hyperlipidemia    Hypertension    IBS (irritable bowel syndrome)    Joint pain    Neuromuscular disorder ([place])    Prediabetes     Past Surgical History:  Procedure Laterality Date   CHOLECYSTECTOMY  2009   low GB EF, no gallstones   COLONOSCOPY     LAPAROSCOPY     of adhesions/ of ovaries   UPPER GASTROINTESTINAL ENDOSCOPY     VAGINAL HYSTERECTOMY      Prior to Admission medications   Medication Sig Start Date End Date Taking? Authorizing Provider  cholecalciferol (VITAMIN D3) 25 MCG (1000 UNIT) tablet Take 1,000 Units by mouth daily.   Yes [provider]  Fluocinolone Acetonide Body 0.01 % OIL Apply topically 3 (three) times a week. 11/09/21  Yes [provider]  Glucosamine-Chondroitin (GLUCOSAMINE CHONDR COMPLEX PO) Take 2 capsules by mouth daily.   Yes [provider]  hydrochlorothiazide (HYDRODIURIL) 25 MG tablet TAKE 1 TABLET(25 MG) BY MOUTH DAILY 01/05/22  Yes [name], NP  linaclotide (LINZESS) 290 MCG CAPS capsule TAKE 1 CAPSULE(290 MCG) BY MOUTH DAILY BEFORE BREAKFAST 03/25/22  Yes [name], NP  LIVALO 4 MG TABS TAKE 1 TABLET(4 MG) BY MOUTH DAILY 04/06/21  Yes [name], NP  Multiple Vitamin (MULTIVITAMIN WITH MINERALS) TABS tablet  Take 1 tablet by mouth daily.   Yes [provider]  pantoprazole (PROTONIX) 40 MG tablet Take 1 tablet (40 mg total) by mouth 2 (two) times daily. Patient taking differently: Take 40 mg by mouth as needed. 12/05/20  Yes Ladene Artist, MD  Saccharomyces boulardii (PROBIOTIC) 250 MG CAPS Take by mouth.   Yes [provider]  sitaGLIPtin (JANUVIA) 100 MG tablet TAKE 1 TABLET(100 MG) BY MOUTH DAILY 01/02/22  Yes [name], NP  timolol (TIMOPTIC) 0.5 % ophthalmic solution Place 1 drop into the left eye 2 (two) times daily. 11/29/20  Yes [provider]  Ascorbic Acid (VITAMIN C) 100 MG tablet Take 100 mg by mouth daily. Patient not taking: Reported on 03/28/2022    [provider]  diphenhydrAMINE-nystatin-hydrocortisone Swish and spit 5 mls 4 times daily as needed for mouth pain as directed. 10/05/21   Terrilyn Saver, NP  fexofenadine (ALLEGRA) 60 MG tablet Take 60 mg by mouth 2 (two) times daily. Patient not taking: Reported on 02/28/2022 02/14/20   [provider]  lisinopril (ZESTRIL) 2.5 MG tablet Take 1 tablet (2.5 mg total) by mouth daily. Patient not taking: Reported on 02/28/2022 11/05/21   [name], NP  pramipexole (MIRAPEX) 0.75 MG tablet  11/09/21   [provider]  Sennosides (SENOKOT PO) Take by mouth.  TAKE 4-5 PILLS PER DAY    [provider]  zinc gluconate 50 MG tablet Take 50 mg by mouth daily. Patient not taking: Reported on 02/28/2022    [provider]    Current Outpatient Medications  Medication Sig Dispense Refill   cholecalciferol (VITAMIN D3) 25 MCG (1000 UNIT) tablet Take 1,000 Units by mouth daily.     Fluocinolone Acetonide Body 0.01 % OIL Apply topically 3 (three) times a week.     Glucosamine-Chondroitin (GLUCOSAMINE CHONDR COMPLEX PO) Take 2 capsules by mouth daily.     hydrochlorothiazide (HYDRODIURIL) 25 MG tablet TAKE 1 TABLET(25 MG) BY MOUTH DAILY 90 tablet 1   linaclotide  (LINZESS) 290 MCG CAPS capsule TAKE 1 CAPSULE(290 MCG) BY MOUTH DAILY BEFORE BREAKFAST 90 capsule 1   LIVALO 4 MG TABS TAKE 1 TABLET(4 MG) BY MOUTH DAILY 30 tablet 5   Multiple Vitamin (MULTIVITAMIN WITH MINERALS) TABS tablet Take 1 tablet by mouth daily.     pantoprazole (PROTONIX) 40 MG tablet Take 1 tablet (40 mg total) by mouth 2 (two) times daily. (Patient taking differently: Take 40 mg by mouth as needed.) 180 tablet 3   Saccharomyces boulardii (PROBIOTIC) 250 MG CAPS Take by mouth.     sitaGLIPtin (JANUVIA) 100 MG tablet TAKE 1 TABLET(100 MG) BY MOUTH DAILY 30 tablet 5   timolol (TIMOPTIC) 0.5 % ophthalmic solution Place 1 drop into the left eye 2 (two) times daily.     Ascorbic Acid (VITAMIN C) 100 MG tablet Take 100 mg by mouth daily. (Patient not taking: Reported on 03/28/2022)     diphenhydrAMINE-nystatin-hydrocortisone Swish and spit 5 mls 4 times daily as needed for mouth pain as directed. 240 mL 1   fexofenadine (ALLEGRA) 60 MG tablet Take 60 mg by mouth 2 (two) times daily. (Patient not taking: Reported on 02/28/2022)     lisinopril (ZESTRIL) 2.5 MG tablet Take 1 tablet (2.5 mg total) by mouth daily. (Patient not taking: Reported on 02/28/2022) 90 tablet 1   pramipexole (MIRAPEX) 0.75 MG tablet  (Patient not taking: Reported on 02/28/2022)     Sennosides (SENOKOT PO) Take by mouth. TAKE 4-5 PILLS PER DAY     zinc gluconate 50 MG tablet Take 50 mg by mouth daily. (Patient not taking: Reported on 02/28/2022)     Current Facility-Administered Medications  Medication Dose Route Frequency Provider Last Rate Last Admin   0.9 %  sodium chloride infusion  500 mL Intravenous Continuous Ladene Artist, MD        Allergies as of 03/28/2022 - Review Complete 03/28/2022  Allergen Reaction Noted   Crestor [rosuvastatin calcium]  09/09/2011   Metformin and related  09/09/2011   Rosuvastatin Other (See Comments) 09/09/2011    Family History  Problem Relation Age of Onset   Heart disease  Mother    Hypertension Mother    Hyperlipidemia Mother    Obesity Mother    Colon cancer Father    Hypertension Father    Hyperlipidemia Father    Obesity Father    Colon polyps Sister    Diabetes Sister    Diabetes Brother        Half   Heart disease Brother    Breast cancer Paternal Grandmother    Esophageal cancer Neg Hx    Stomach cancer Neg Hx    Rectal cancer Neg Hx    Crohn's disease Neg Hx    Ulcerative colitis Neg Hx     Social History   Socioeconomic  History   Marital status: Single    Spouse name: Not on file   Number of children: Not on file   Years of education: Not on file   Highest education level: Not on file  Occupational History   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 [employer]  Tobacco Use   Smoking status: Former    Packs/day: 0.10    Years: 10.00    Total pack years: 1.00    Types: Cigarettes    Quit date: 07/14/1981    Years since quitting: 40.7    Passive exposure: Never   Smokeless tobacco: Never  Vaping Use   Vaping Use: Never used  Substance and Sexual Activity   Alcohol use: No   Drug use: No   Sexual activity: Not Currently  Other Topics Concern   Not on file  Social History Narrative   Widowed- husband passed last year   2 grown sons, grandchildren   Works for a Museum/gallery curator, Engineer, maintenance (IT)   No pets   Enjoys writing, reading, being a grandmother, walking   Engineer, 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   Physical Exam: General:  Alert, well-developed, in NAD Head:  Normocephalic and atraumatic. Eyes:  Sclera clear, no icterus.    Conjunctiva pink. Ears:  Normal auditory acuity. Mouth:  No deformity or lesions.  Neck:  Supple; no masses . Lungs:  Clear throughout to auscultation.   No wheezes, crackles, or rhonchi. No acute distress. Heart:  Regular rate and rhythm; no murmurs. Abdomen:  Soft, nondistended, nontender. No masses, hepatomegaly. No obvious masses.  Normal bowel .    Rectal:  Deferred   Msk:  Symmetrical without gross deformities.. Pulses:  Normal pulses noted. Extremities:  Without edema. Neurologic:  Alert and  oriented x4;  grossly normal neurologically. Skin:  Intact without significant lesions or rashes. Cervical Nodes:  No significant cervical adenopathy. Psych:  Alert and cooperative. Normal mood and affect.   Impression / Plan:   Family history of colon cancer, first-degree relative, for colonoscopy.  Pricilla Riffle. Fuller Plan  03/28/2022, 11:06 AM See Shea Evans, [facility], to contact our on call provider

## 2022-03-28 NOTE — Progress Notes (Signed)
Called to room to assist during endoscopic procedure.  Patient ID and intended procedure confirmed with present staff. Received instructions for my participation in the procedure from the performing physician.  

## 2022-03-28 NOTE — Progress Notes (Signed)
Report given to PACU, vss 

## 2022-03-29 ENCOUNTER — Telehealth: Payer: Self-pay

## 2022-03-29 NOTE — Telephone Encounter (Signed)
  Follow up Call-     03/28/2022   10:29 AM  Call back number  Post procedure Call Back phone  # [phone]  Permission to leave phone message Yes     Patient questions:  Do you have a fever, pain , or abdominal swelling? No. Pain Score  0 *  Have you tolerated food without any problems? Yes.    Have you been able to return to your normal activities? Yes.    Do you have any questions about your discharge instructions: Diet   No. Medications  No. Follow up visit  No.  Do you have questions or concerns about your Care? No.  Actions: * If pain score is 4 or above: No action needed, pain <4.

## 2022-04-11 ENCOUNTER — Encounter: Payer: Self-pay | Admitting: Gastroenterology

## 2022-04-11 ENCOUNTER — Other Ambulatory Visit: Payer: Self-pay | Admitting: Family

## 2022-04-22 DIAGNOSIS — X58XXXA Exposure to other specified factors, initial encounter: Secondary | ICD-10-CM | POA: Diagnosis not present

## 2022-04-22 DIAGNOSIS — G8918 Other acute postprocedural pain: Secondary | ICD-10-CM | POA: Diagnosis not present

## 2022-04-22 DIAGNOSIS — M1711 Unilateral primary osteoarthritis, right knee: Secondary | ICD-10-CM | POA: Diagnosis not present

## 2022-04-22 DIAGNOSIS — Y999 Unspecified external cause status: Secondary | ICD-10-CM | POA: Diagnosis not present

## 2022-04-22 DIAGNOSIS — M794 Hypertrophy of (infrapatellar) fat pad: Secondary | ICD-10-CM | POA: Diagnosis not present

## 2022-04-22 DIAGNOSIS — S83271A Complex tear of lateral meniscus, current injury, right knee, initial encounter: Secondary | ICD-10-CM | POA: Diagnosis not present

## 2022-04-22 DIAGNOSIS — S83241A Other tear of medial meniscus, current injury, right knee, initial encounter: Secondary | ICD-10-CM | POA: Diagnosis not present

## 2022-04-22 DIAGNOSIS — M94261 Chondromalacia, right knee: Secondary | ICD-10-CM | POA: Diagnosis not present

## 2022-04-22 HISTORY — PX: OTHER SURGICAL HISTORY: SHX169

## 2022-04-23 IMAGING — CT CT MAXILLOFACIAL W/O CM
3 series · 16 of 47 positions shown, 19 images · non-contrast
Comparison: None.

CLINICAL DATA: Headache.  Right forehead swelling.

EXAM:
CT HEAD WITHOUT CONTRAST
CT MAXILLOFACIAL WITHOUT CONTRAST
TECHNIQUE: Multidetector CT imaging of the head and maxillofacial structures
were performed using the standard protocol without intravenous
contrast. Multiplanar CT image reconstructions of the maxillofacial
structures were also generated.

[Series 503: max soft · axial · 0.33mm/px · z∈[-288,-140]mm · 10 of 86 slices shown, 13 images]
[im 6/86  brain]
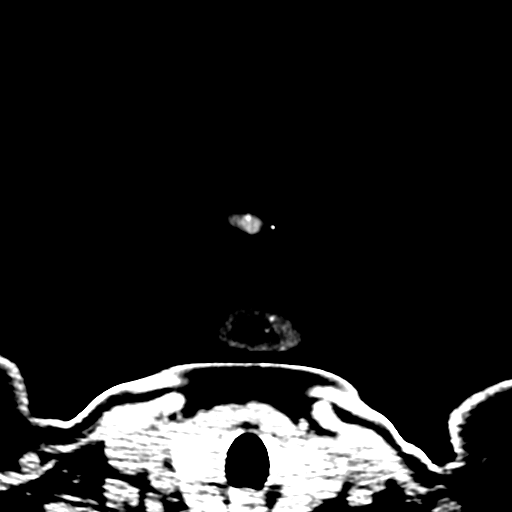
[im 6/86  bone]
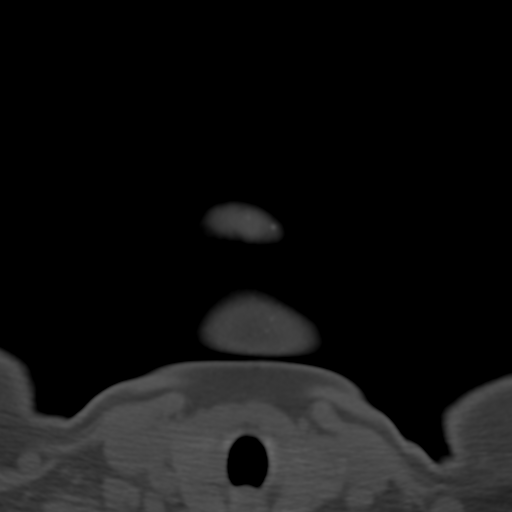
[im 15/86  bone]
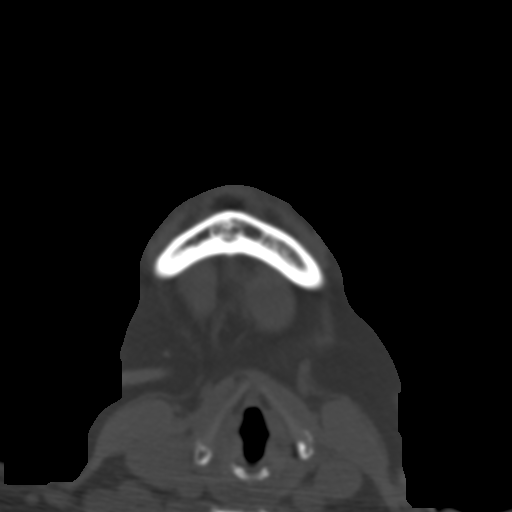
[im 24/86  bone]
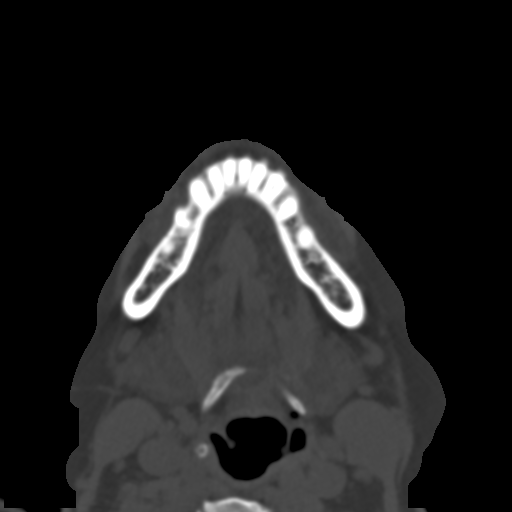
[im 30/86  bone]
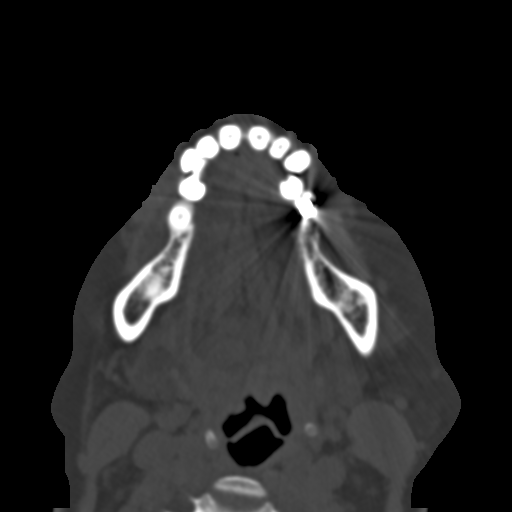
[im 39/86  brain]
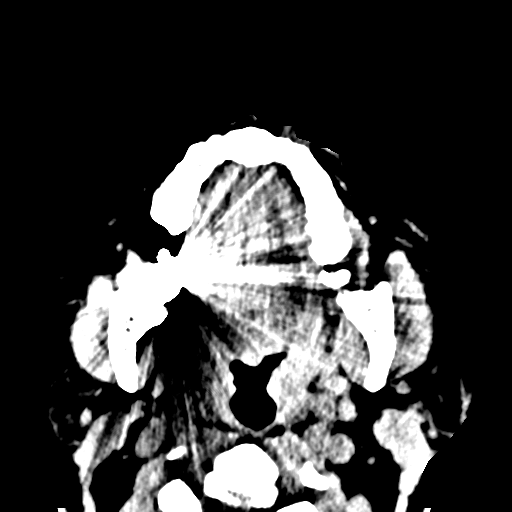
[im 39/86  bone]
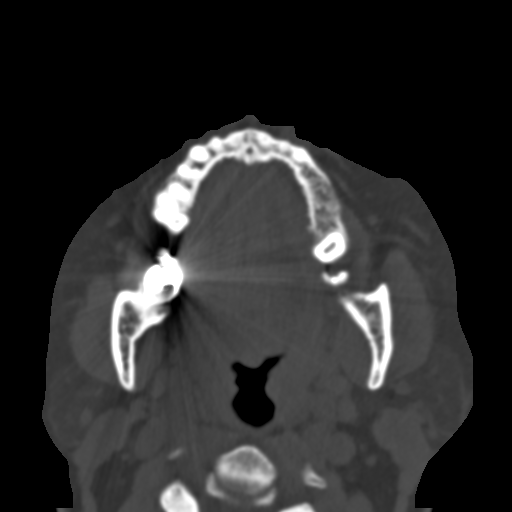
[im 47/86  bone]
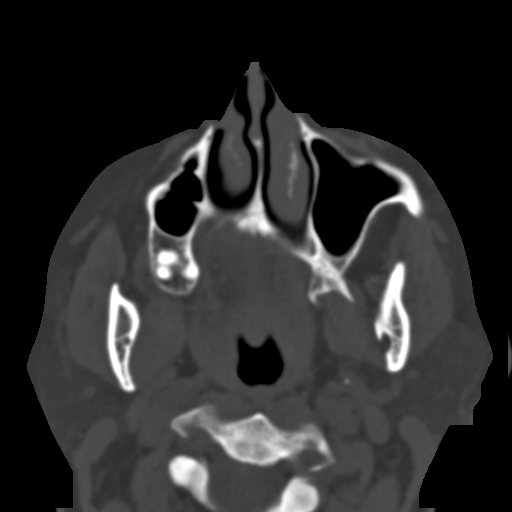
[im 56/86  bone]
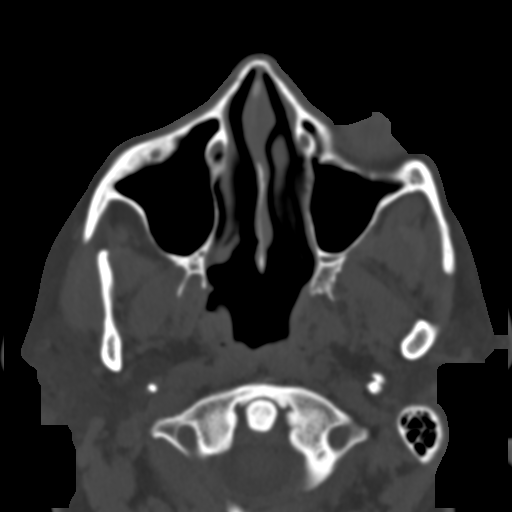
[im 65/86  bone]
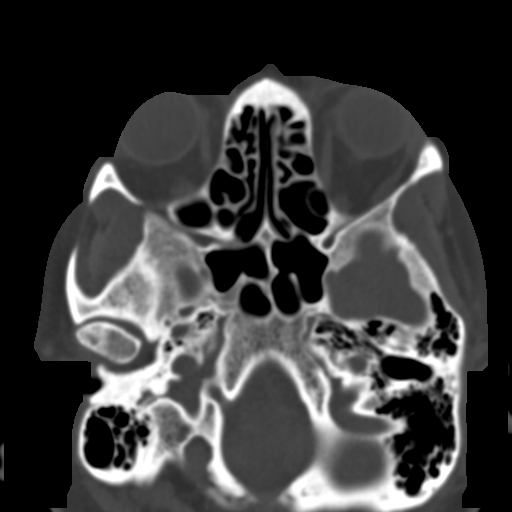
[im 71/86  brain]
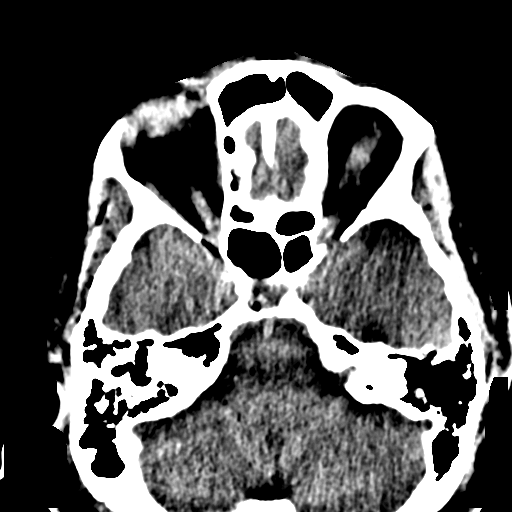
[im 71/86  bone]
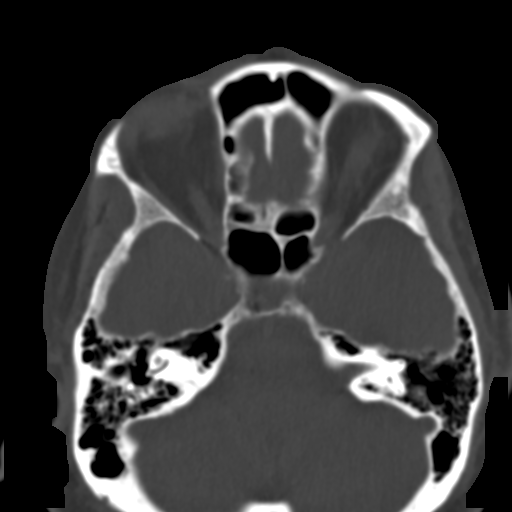
[im 80/86  bone]
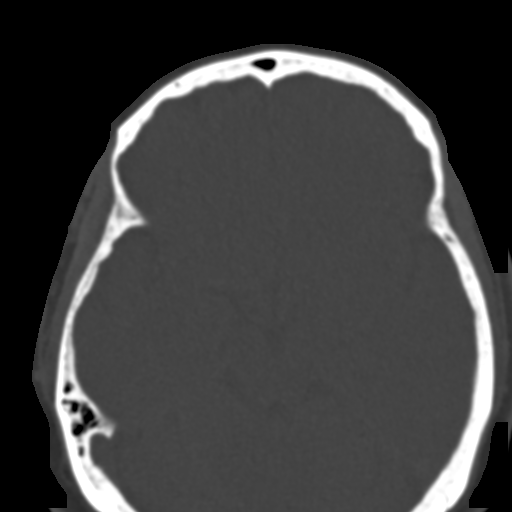

[Series 505: coronal soft · coronal · 0.38mm/px · 3 of 87 slices shown]
[im 29/87  bone]
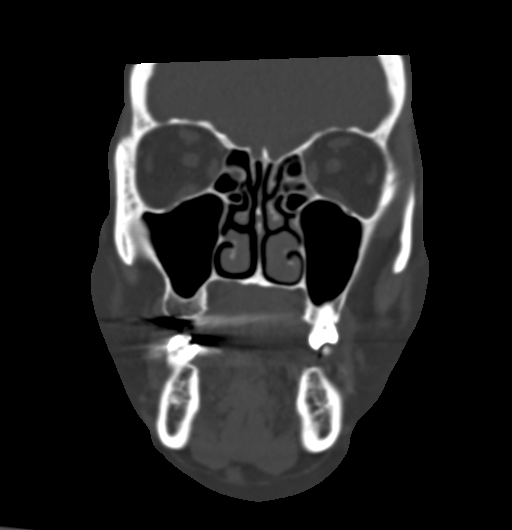
[im 39/87  bone]
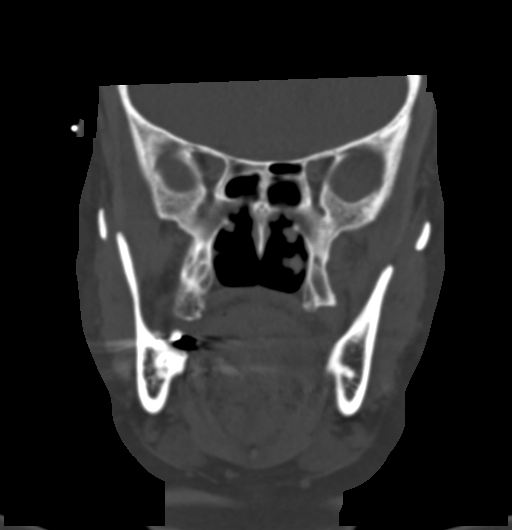
[im 48/87  bone]
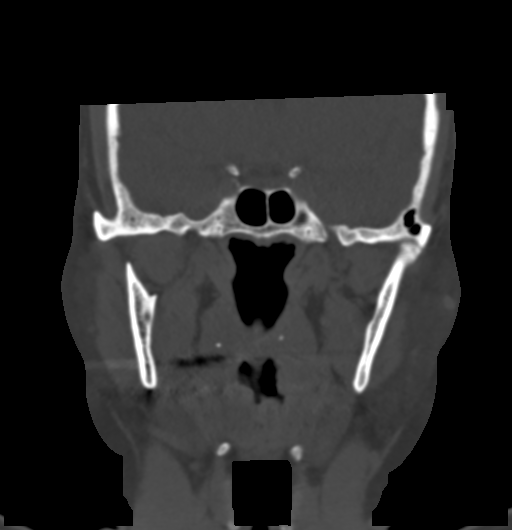

[Series 508: sagittal soft · sagittal · 0.34mm/px · 3 of 85 slices shown]
[im 29/85  bone]
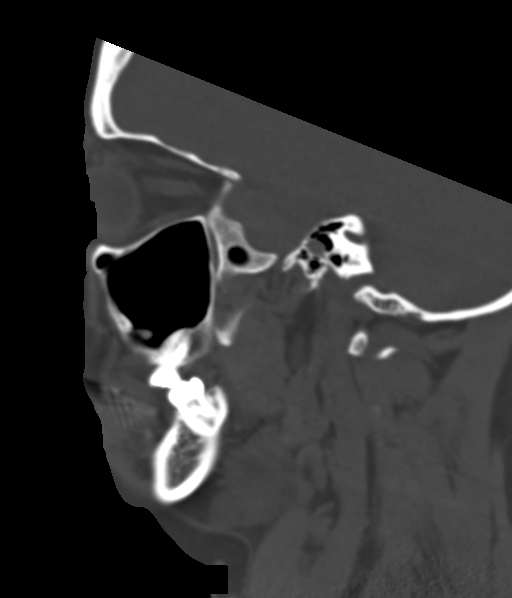
[im 43/85  bone]
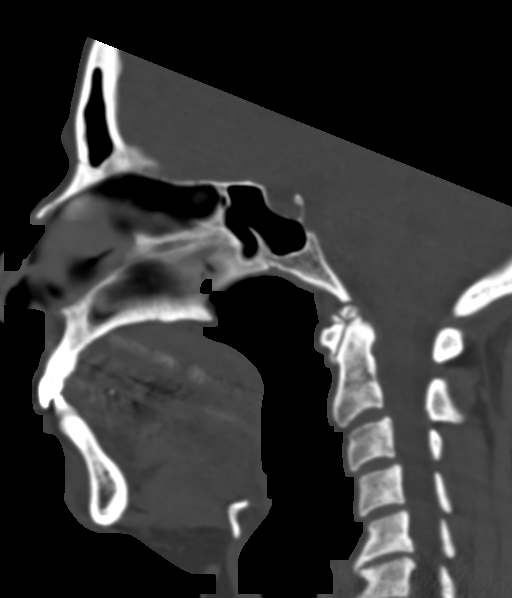
[im 57/85  bone]
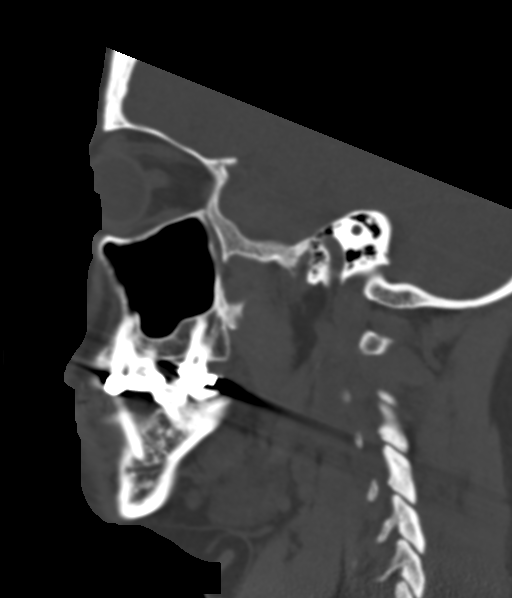

[16 of 47 positions shown; findings below may reference images not displayed]

FINDINGS: CT HEAD FINDINGS

Brain: No evidence of acute large vascular territory infarction,
hemorrhage, hydrocephalus, extra-axial collection or mass
lesion/mass effect.

Vascular: Mild calcific atherosclerosis. No hyperdense vessel
identified.

Skull: No acute fracture.

Other: No mastoid effusions.

CT MAXILLOFACIAL FINDINGS

Osseous: No fracture or mandibular dislocation. No destructive
process.

Orbits: Negative. No traumatic or inflammatory finding.

Sinuses: Minimal scattered paranasal sinus mucosal thickening. No
air-fluid levels.

Soft tissues: Very mild right preseptal periorbital soft tissue
stranding. No fluid collection. No postseptal stranding.
IMPRESSION: 1. No evidence of acute intracranial abnormality.
2. Very mild, nonspecific right preseptal periorbital soft tissue
stranding. No fluid collection or postseptal abnormality.

## 2022-04-29 ENCOUNTER — Ambulatory Visit (HOSPITAL_BASED_OUTPATIENT_CLINIC_OR_DEPARTMENT_OTHER)
Admission: RE | Admit: 2022-04-29 | Discharge: 2022-04-29 | Disposition: A | Discharge status: Home / Self Care | Payer: Medicare Other | Source: Ambulatory Visit | Attending: Family | Admitting: Family

## 2022-04-29 ENCOUNTER — Encounter (HOSPITAL_BASED_OUTPATIENT_CLINIC_OR_DEPARTMENT_OTHER): Payer: Self-pay

## 2022-04-29 ENCOUNTER — Encounter: Payer: Self-pay | Admitting: Family

## 2022-04-29 ENCOUNTER — Ambulatory Visit (INDEPENDENT_AMBULATORY_CARE_PROVIDER_SITE_OTHER): Payer: Medicare Other | Admitting: Family

## 2022-04-29 VITALS — BP 115/69 | HR 75 | Temp 98.3°F | Resp 16 | Wt 234.0 lb

## 2022-04-29 DIAGNOSIS — R809 Proteinuria, unspecified: Secondary | ICD-10-CM

## 2022-04-29 DIAGNOSIS — H6123 Impacted cerumen, bilateral: Secondary | ICD-10-CM | POA: Diagnosis not present

## 2022-04-29 DIAGNOSIS — I10 Essential (primary) hypertension: Secondary | ICD-10-CM

## 2022-04-29 DIAGNOSIS — M179 Osteoarthritis of knee, unspecified: Secondary | ICD-10-CM | POA: Diagnosis not present

## 2022-04-29 DIAGNOSIS — E1129 Type 2 diabetes mellitus with other diabetic kidney complication: Secondary | ICD-10-CM

## 2022-04-29 DIAGNOSIS — Z1231 Encounter for screening mammogram for malignant neoplasm of breast: Secondary | ICD-10-CM

## 2022-04-29 DIAGNOSIS — K581 Irritable bowel syndrome with constipation: Secondary | ICD-10-CM | POA: Diagnosis not present

## 2022-04-29 DIAGNOSIS — K219 Gastro-esophageal reflux disease without esophagitis: Secondary | ICD-10-CM

## 2022-04-29 LAB — MICROALBUMIN / CREATININE URINE RATIO
Creatinine,U: 191.9 mg/dL
Microalb Creat Ratio: 0.5 mg/g (ref 0.0–30.0)
Microalb, Ur: 1 mg/dL (ref 0.0–1.9)

## 2022-04-29 LAB — COMPREHENSIVE METABOLIC PANEL
ALT: 31 U/L (ref 0–35)
AST: 12 U/L (ref 0–37)
Albumin: 4.2 g/dL (ref 3.5–5.2)
Alkaline Phosphatase: 74 U/L (ref 39–117)
BUN: 14 mg/dL (ref 6–23)
CO2: 29 mEq/L (ref 19–32)
Calcium: 10 mg/dL (ref 8.4–10.5)
Chloride: 97 mEq/L (ref 96–112)
Creatinine, Ser: 0.75 mg/dL (ref 0.40–1.20)
GFR: 82.38 mL/min (ref >60.00)
Glucose, Bld: 136 mg/dL — ABNORMAL HIGH (ref 70–99)
Potassium: 4.3 mEq/L (ref 3.5–5.1)
Sodium: 134 mEq/L — ABNORMAL LOW (ref 135–145)
Total Bilirubin: 0.8 mg/dL (ref 0.2–1.2)
Total Protein: 7.3 g/dL (ref 6.0–8.3)

## 2022-04-29 LAB — HEMOGLOBIN A1C: Hgb A1c MFr Bld: 7.2 % — ABNORMAL HIGH (ref 4.6–6.5)

## 2022-04-29 MED ORDER — OMEPRAZOLE 40 MG PO CPDR: 40.0000 mg | DELAYED_RELEASE_CAPSULE | Freq: Every day | ORAL | 3 refills | Status: DC | PRN

## 2022-04-29 NOTE — Assessment & Plan Note (Signed)
Stable with prn protonix. Too expensive. Change to omeprazole.

## 2022-04-29 NOTE — Progress Notes (Signed)
Subjective:   By signing my name below, I, Luna Glasgow, attest that this documentation has been prepared under the direction and in the presence of [name], 04/29/2022.    Patient ID: [name], female    DOB: 1955-12-08, 66 y.o.   MRN: [id number]  Chief Complaint  Patient presents with   Diabetes    Here for follow up, discontinued Januvia about 2 months ago, due to side effects.     HPI Patient is in today for an office visit.  Right knee  Patient reports that she has recently had a right knee arthroscopy completed.  Diabetes Patient reports that she stopped taking 100 mg Januvia two months ago because it caused a burning sensation in mouth. She states that she started taking it occasionally, explaining that within the last 30 days she's taken it about 10 of those days.   Constipation  Patient states that her constipation is well managed with OTC products.   Hypertension Patient reports that she is complaint with her 2.5 mg lisinopril and 25 mg Hydrodiuril. Her blood pressure is normal this visit. BP Readings from Last 3 Encounters:  04/29/22 115/69  03/28/22 125/79  12/17/21 128/71   Pulse Readings from Last 3 Encounters:  04/29/22 75  03/28/22 68  12/17/21 67   GERD Patient reports that she takes Protonix 40 mg prn, but hasn't needed it much.   Left and Right Ear Cerumen Impaction  Patient is requesting to have both of her ears cleaned today.  Health Maintenance Due  Topic Date Due   Medicare Annual Wellness (AWV)  Never done   MAMMOGRAM  12/01/2020   FOOT EXAM  02/17/2021   Diabetic kidney evaluation - Urine ACR  09/25/2021   INFLUENZA VACCINE  01/01/2022   COVID-19 Vaccine (5 - 2023-24 season) 02/01/2022    Past Medical History:  Diagnosis Date   Allergy    SEASONAL   Arthritis    Back pain    Benign fundic gland polyps of stomach    Cataract    EARL STAGES   Constipation    Diabetes mellitus without complication (HCC)     Esophagitis    LA Class B   Fatigue    Fatty liver    Fatty liver    Fibromyalgia    Gallbladder problem    GERD (gastroesophageal reflux disease)    Heart murmur    History of arthroscopy of right knee    Hyperlipidemia    Hypertension    IBS (irritable bowel syndrome)    Joint pain    Neuromuscular disorder ([place])    Prediabetes     Past Surgical History:  Procedure Laterality Date   CHOLECYSTECTOMY  2009   low GB EF, no gallstones   COLONOSCOPY     LAPAROSCOPY     of adhesions/ of ovaries   UPPER GASTROINTESTINAL ENDOSCOPY     VAGINAL HYSTERECTOMY      Family History  Problem Relation Age of Onset   Heart disease Mother    Hypertension Mother    Hyperlipidemia Mother    Obesity Mother    Colon cancer Father    Hypertension Father    Hyperlipidemia Father    Obesity Father    Colon polyps Sister    Diabetes Sister    Diabetes Brother        Half   Heart disease Brother    Breast cancer Paternal Grandmother    Esophageal cancer Neg Hx  Stomach cancer Neg Hx    Rectal cancer Neg Hx    Crohn's disease Neg Hx    Ulcerative colitis Neg Hx     Social History   Socioeconomic History   Marital status: Single    Spouse name: Not on file   Number of children: Not on file   Years of education: Not on file   Highest education level: Not on file  Occupational History   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 [employer]  Tobacco Use   Smoking status: Former    Packs/day: 0.10    Years: 10.00    Total pack years: 1.00    Types: Cigarettes    Quit date: 07/14/1981    Years since quitting: 40.8    Passive exposure: Never   Smokeless tobacco: Never  Vaping Use   Vaping Use: Never used  Substance and Sexual Activity   Alcohol use: No   Drug use: No   Sexual activity: Not Currently  Other Topics Concern   Not on file  Social History Narrative   Widowed- husband passed last year   2 grown sons, grandchildren   Works for a Museum/gallery curator, Advice worker   No pets   Enjoys writing, reading, being a grandmother, walking   Engineer, 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     cholecalciferol (VITAMIN D3) 25 MCG (1000 UNIT) tablet Take 1,000 Units by mouth daily.     diphenhydrAMINE-nystatin-hydrocortisone Swish and spit 5 mls 4 times daily as needed for mouth pain as directed. 240 mL 1   fexofenadine (ALLEGRA) 60 MG tablet Take 60 mg by mouth 2 (two) times daily.     Fluocinolone Acetonide Body 0.01 % OIL Apply topically 3 (three) times a week.     Glucosamine-Chondroitin (GLUCOSAMINE CHONDR COMPLEX PO) Take 2 capsules by mouth daily.     hydrochlorothiazide (HYDRODIURIL) 25 MG tablet TAKE 1 TABLET(25 MG) BY MOUTH DAILY 90 tablet 1   lisinopril (ZESTRIL) 2.5 MG tablet Take 1 tablet (2.5 mg total) by mouth daily. 90 tablet 1   LIVALO 4 MG TABS TAKE 1 TABLET(4 MG) BY MOUTH DAILY 30 tablet 5   Multiple Vitamin (MULTIVITAMIN WITH MINERALS) TABS tablet Take 1 tablet by mouth daily.     pramipexole (MIRAPEX) 0.75 MG tablet      Saccharomyces boulardii (PROBIOTIC) 250 MG CAPS Take by mouth.     Sennosides (SENOKOT PO) Take by mouth. TAKE 4-5 PILLS PER DAY     timolol (TIMOPTIC) 0.5 % ophthalmic solution Place 1 drop into the left eye 2 (two) times daily.     zinc gluconate 50 MG tablet Take 50 mg by mouth daily.     pantoprazole (PROTONIX) 40 MG tablet Take 1 tablet (40 mg total) by mouth 2 (two) times daily. (Patient taking differently: Take 40 mg by mouth as needed.) 180 tablet 3   linaclotide  (LINZESS) 290 MCG CAPS capsule TAKE 1 CAPSULE(290 MCG) BY MOUTH DAILY BEFORE BREAKFAST (Patient not taking: Reported on  04/29/2022) 90 capsule 1   sitaGLIPtin (JANUVIA) 100 MG tablet TAKE 1 TABLET(100 MG) BY MOUTH DAILY (Patient not taking: Reported on 04/29/2022) 30 tablet 5   No facility-administered medications prior to visit.    Allergies  Allergen Reactions   Crestor [Rosuvastatin Calcium]     Muscle aches   Metformin And Related     abd pain   Rosuvastatin Other (See Comments)    Muscle aches    ROS     Objective:    Physical Exam Constitutional:      General: She is not in acute distress.    Appearance: Normal appearance. She is not ill-appearing.  HENT:     Head: Normocephalic and atraumatic.     Right Ear: External ear normal. There is impacted cerumen.     Left Ear: External ear normal. There is impacted cerumen.  Eyes:     Extraocular Movements: Extraocular movements intact.     Pupils: Pupils are equal, round, and reactive to light.  Cardiovascular:     Rate and Rhythm: Normal rate and regular rhythm.     Heart sounds: Normal heart sounds. No murmur heard.    No gallop.  Pulmonary:     Effort: Pulmonary effort is normal. No respiratory distress.     Breath sounds: Normal breath sounds. No wheezing or rales.  Skin:    General: Skin is warm and dry.  Neurological:     Mental Status: She is alert and oriented to person, place, and time.  Psychiatric:        Mood and Affect: Mood normal.        Behavior: Behavior normal.        Judgment: Judgment normal.     BP 115/69 (BP Location: Right Arm, Patient Position: Sitting, Cuff Size: Large)   Pulse 75   Temp 98.3 F (36.8 C) (Oral)   Resp 16   Wt 234 lb (106.1 kg)   SpO2 98%   BMI 36.11 kg/m  Wt Readings from Last 3 Encounters:  04/29/22 234 lb (106.1 kg)  03/28/22 235 lb (106.6 kg)  02/28/22 235 lb (106.6 kg)       Assessment & Plan:   Problem List Items Addressed This Visit        Unprioritized   IBS (irritable bowel syndrome)    Fair control. Could not afford linzess.       Relevant Medications   omeprazole (PRILOSEC) 40 MG capsule   HYPERTENSION, BENIGN ESSENTIAL    BP Readings from Last 3 Encounters:  04/29/22 115/69  03/28/22 125/79  12/17/21 128/71  BP stable. Continue lisinopril hctz.       GERD    Stable with prn protonix. Too expensive. Change to omeprazole.       Relevant Medications   omeprazole (PRILOSEC) 40 MG capsule   DJD (degenerative joint disease) of knee    S/p right knee arthroscopy. Reports that she is doing well post-operative.       Controlled type 2 diabetes mellitus with microalbuminuria, without long-term current use of insulin ([place]) - Primary    Lab Results  Component Value Date   HGBA1C 6.8 (H) 11/26/2021   HGBA1C 7.5 (H) 04/17/2021   HGBA1C 6.7 (H) 10/04/2020   Lab Results  Component Value Date   MICROALBUR 3.7 (H) 09/25/2020   LDLCALC 91 04/17/2021   CREATININE 0.81 12/17/2021  Reports that burning sensation in her mouth is much.  She has taken [place] about 10 of the last 30  days. Will d/c januvia. Repeat A1C. Further recommendations pending review of A1C.       Relevant Orders   Hemoglobin A1c   Comp Met (CMET)   Bilateral impacted cerumen    After verbal consent, pt's ears were irrigated. Pt tolerated procedure well.       Other Visit Diagnoses     Encounter for screening mammogram for malignant neoplasm of breast       Relevant Orders   MM 3D SCREEN BREAST BILATERAL   Urine Microalbumin w/creat. ratio      Meds ordered this encounter  Medications   omeprazole (PRILOSEC) 40 MG capsule    Sig: Take 1 capsule (40 mg total) by mouth daily as needed.    Dispense:  30 capsule    Refill:  3    Order Specific Question:   Supervising Provider    Answer:   Penni Homans A [[id number]]    I, [name], personally preformed the services described in this documentation.  All medical record entries made  by the scribe were at my direction and in my presence.  I have reviewed the chart and discharge instructions (if applicable) and agree that the record reflects my personal performance and is accurate and complete. 04/29/2022.   I,Verona Buck,acting as a Education administrator for [employer], NP.,have documented all relevant documentation on the behalf of Nance Pear, NP,as directed by  Nance Pear, NP while in the presence of Nance Pear, NP.    Nance Pear, NP

## 2022-04-29 NOTE — Assessment & Plan Note (Signed)
BP Readings from Last 3 Encounters:  04/29/22 115/69  03/28/22 125/79  12/17/21 128/71   BP stable. Continue lisinopril hctz.

## 2022-04-29 NOTE — Assessment & Plan Note (Addendum)
Lab Results  Component Value Date   HGBA1C 6.8 (H) 11/26/2021   HGBA1C 7.5 (H) 04/17/2021   HGBA1C 6.7 (H) 10/04/2020   Lab Results  Component Value Date   MICROALBUR 3.7 (H) 09/25/2020   LDLCALC 91 04/17/2021   CREATININE 0.81 12/17/2021   Reports that burning sensation in her mouth is much.  She has taken [place] about 10 of the last 30 days. Will d/c januvia. Repeat A1C. Further recommendations pending review of A1C.

## 2022-04-29 NOTE — Assessment & Plan Note (Signed)
After verbal consent, pt's ears were irrigated. Pt tolerated procedure well.

## 2022-04-29 NOTE — Assessment & Plan Note (Signed)
S/p right knee arthroscopy. Reports that she is doing well post-operative.

## 2022-04-29 NOTE — Assessment & Plan Note (Signed)
Fair control. Could not afford linzess.

## 2022-05-06 DIAGNOSIS — H43812 Vitreous degeneration, left eye: Secondary | ICD-10-CM | POA: Diagnosis not present

## 2022-05-06 DIAGNOSIS — H5203 Hypermetropia, bilateral: Secondary | ICD-10-CM | POA: Diagnosis not present

## 2022-05-06 DIAGNOSIS — Z7984 Long term (current) use of oral hypoglycemic drugs: Secondary | ICD-10-CM | POA: Diagnosis not present

## 2022-05-06 DIAGNOSIS — H401131 Primary open-angle glaucoma, bilateral, mild stage: Secondary | ICD-10-CM | POA: Diagnosis not present

## 2022-05-06 DIAGNOSIS — H524 Presbyopia: Secondary | ICD-10-CM | POA: Diagnosis not present

## 2022-05-06 DIAGNOSIS — H52203 Unspecified astigmatism, bilateral: Secondary | ICD-10-CM | POA: Diagnosis not present

## 2022-05-06 DIAGNOSIS — H02831 Dermatochalasis of right upper eyelid: Secondary | ICD-10-CM | POA: Diagnosis not present

## 2022-05-06 DIAGNOSIS — E119 Type 2 diabetes mellitus without complications: Secondary | ICD-10-CM | POA: Diagnosis not present

## 2022-05-06 DIAGNOSIS — H04123 Dry eye syndrome of bilateral lacrimal glands: Secondary | ICD-10-CM | POA: Diagnosis not present

## 2022-05-06 DIAGNOSIS — H02834 Dermatochalasis of left upper eyelid: Secondary | ICD-10-CM | POA: Diagnosis not present

## 2022-05-06 LAB — HM DIABETES EYE EXAM

## 2022-05-07 DIAGNOSIS — R2689 Other abnormalities of gait and mobility: Secondary | ICD-10-CM | POA: Diagnosis not present

## 2022-05-07 DIAGNOSIS — M6281 Muscle weakness (generalized): Secondary | ICD-10-CM | POA: Diagnosis not present

## 2022-05-07 DIAGNOSIS — M25561 Pain in right knee: Secondary | ICD-10-CM | POA: Diagnosis not present

## 2022-05-07 DIAGNOSIS — M25661 Stiffness of right knee, not elsewhere classified: Secondary | ICD-10-CM | POA: Diagnosis not present

## 2022-05-09 DIAGNOSIS — M25561 Pain in right knee: Secondary | ICD-10-CM | POA: Diagnosis not present

## 2022-05-09 DIAGNOSIS — M6281 Muscle weakness (generalized): Secondary | ICD-10-CM | POA: Diagnosis not present

## 2022-05-09 DIAGNOSIS — M25661 Stiffness of right knee, not elsewhere classified: Secondary | ICD-10-CM | POA: Diagnosis not present

## 2022-05-09 DIAGNOSIS — R2689 Other abnormalities of gait and mobility: Secondary | ICD-10-CM | POA: Diagnosis not present

## 2022-05-13 DIAGNOSIS — M6281 Muscle weakness (generalized): Secondary | ICD-10-CM | POA: Diagnosis not present

## 2022-05-13 DIAGNOSIS — R2689 Other abnormalities of gait and mobility: Secondary | ICD-10-CM | POA: Diagnosis not present

## 2022-05-13 DIAGNOSIS — M25661 Stiffness of right knee, not elsewhere classified: Secondary | ICD-10-CM | POA: Diagnosis not present

## 2022-05-13 DIAGNOSIS — M25561 Pain in right knee: Secondary | ICD-10-CM | POA: Diagnosis not present

## 2022-05-15 ENCOUNTER — Other Ambulatory Visit: Payer: Self-pay | Admitting: Family

## 2022-05-16 DIAGNOSIS — M25661 Stiffness of right knee, not elsewhere classified: Secondary | ICD-10-CM | POA: Diagnosis not present

## 2022-05-16 DIAGNOSIS — M25561 Pain in right knee: Secondary | ICD-10-CM | POA: Diagnosis not present

## 2022-05-16 DIAGNOSIS — M6281 Muscle weakness (generalized): Secondary | ICD-10-CM | POA: Diagnosis not present

## 2022-05-16 DIAGNOSIS — R2689 Other abnormalities of gait and mobility: Secondary | ICD-10-CM | POA: Diagnosis not present

## 2022-05-20 DIAGNOSIS — M25661 Stiffness of right knee, not elsewhere classified: Secondary | ICD-10-CM | POA: Diagnosis not present

## 2022-05-20 DIAGNOSIS — M25561 Pain in right knee: Secondary | ICD-10-CM | POA: Diagnosis not present

## 2022-05-20 DIAGNOSIS — R2689 Other abnormalities of gait and mobility: Secondary | ICD-10-CM | POA: Diagnosis not present

## 2022-05-20 DIAGNOSIS — M6281 Muscle weakness (generalized): Secondary | ICD-10-CM | POA: Diagnosis not present

## 2022-05-23 DIAGNOSIS — M25561 Pain in right knee: Secondary | ICD-10-CM | POA: Diagnosis not present

## 2022-05-23 DIAGNOSIS — M25661 Stiffness of right knee, not elsewhere classified: Secondary | ICD-10-CM | POA: Diagnosis not present

## 2022-05-23 DIAGNOSIS — R2689 Other abnormalities of gait and mobility: Secondary | ICD-10-CM | POA: Diagnosis not present

## 2022-05-23 DIAGNOSIS — M6281 Muscle weakness (generalized): Secondary | ICD-10-CM | POA: Diagnosis not present

## 2022-05-28 DIAGNOSIS — M25561 Pain in right knee: Secondary | ICD-10-CM | POA: Diagnosis not present

## 2022-05-28 DIAGNOSIS — M6281 Muscle weakness (generalized): Secondary | ICD-10-CM | POA: Diagnosis not present

## 2022-05-28 DIAGNOSIS — M25661 Stiffness of right knee, not elsewhere classified: Secondary | ICD-10-CM | POA: Diagnosis not present

## 2022-05-28 DIAGNOSIS — R2689 Other abnormalities of gait and mobility: Secondary | ICD-10-CM | POA: Diagnosis not present

## 2022-05-30 DIAGNOSIS — M6281 Muscle weakness (generalized): Secondary | ICD-10-CM | POA: Diagnosis not present

## 2022-05-30 DIAGNOSIS — R2689 Other abnormalities of gait and mobility: Secondary | ICD-10-CM | POA: Diagnosis not present

## 2022-05-30 DIAGNOSIS — M25561 Pain in right knee: Secondary | ICD-10-CM | POA: Diagnosis not present

## 2022-05-30 DIAGNOSIS — M25661 Stiffness of right knee, not elsewhere classified: Secondary | ICD-10-CM | POA: Diagnosis not present

## 2022-06-04 DIAGNOSIS — M25561 Pain in right knee: Secondary | ICD-10-CM | POA: Diagnosis not present

## 2022-06-04 DIAGNOSIS — M6281 Muscle weakness (generalized): Secondary | ICD-10-CM | POA: Diagnosis not present

## 2022-06-04 DIAGNOSIS — M25661 Stiffness of right knee, not elsewhere classified: Secondary | ICD-10-CM | POA: Diagnosis not present

## 2022-06-04 DIAGNOSIS — R2689 Other abnormalities of gait and mobility: Secondary | ICD-10-CM | POA: Diagnosis not present

## 2022-06-05 ENCOUNTER — Other Ambulatory Visit (HOSPITAL_COMMUNITY): Payer: Self-pay | Admitting: Orthopedic Surgery

## 2022-06-05 DIAGNOSIS — M79604 Pain in right leg: Secondary | ICD-10-CM

## 2022-06-06 ENCOUNTER — Ambulatory Visit (HOSPITAL_COMMUNITY)
Admission: RE | Admit: 2022-06-06 | Discharge: 2022-06-06 | Disposition: A | Discharge status: Home / Self Care | Payer: Medicare Other | Source: Ambulatory Visit | Attending: Cardiology | Admitting: Cardiology

## 2022-06-06 DIAGNOSIS — M6281 Muscle weakness (generalized): Secondary | ICD-10-CM | POA: Diagnosis not present

## 2022-06-06 DIAGNOSIS — M7989 Other specified soft tissue disorders: Secondary | ICD-10-CM | POA: Insufficient documentation

## 2022-06-06 DIAGNOSIS — M79604 Pain in right leg: Secondary | ICD-10-CM | POA: Diagnosis not present

## 2022-06-06 DIAGNOSIS — M25561 Pain in right knee: Secondary | ICD-10-CM | POA: Diagnosis not present

## 2022-06-06 DIAGNOSIS — M25661 Stiffness of right knee, not elsewhere classified: Secondary | ICD-10-CM | POA: Diagnosis not present

## 2022-06-06 DIAGNOSIS — R2689 Other abnormalities of gait and mobility: Secondary | ICD-10-CM | POA: Diagnosis not present

## 2022-06-10 DIAGNOSIS — M6281 Muscle weakness (generalized): Secondary | ICD-10-CM | POA: Diagnosis not present

## 2022-06-10 DIAGNOSIS — M25661 Stiffness of right knee, not elsewhere classified: Secondary | ICD-10-CM | POA: Diagnosis not present

## 2022-06-10 DIAGNOSIS — R2689 Other abnormalities of gait and mobility: Secondary | ICD-10-CM | POA: Diagnosis not present

## 2022-06-10 DIAGNOSIS — M25561 Pain in right knee: Secondary | ICD-10-CM | POA: Diagnosis not present

## 2022-06-13 DIAGNOSIS — R2689 Other abnormalities of gait and mobility: Secondary | ICD-10-CM | POA: Diagnosis not present

## 2022-06-13 DIAGNOSIS — M6281 Muscle weakness (generalized): Secondary | ICD-10-CM | POA: Diagnosis not present

## 2022-06-13 DIAGNOSIS — M25661 Stiffness of right knee, not elsewhere classified: Secondary | ICD-10-CM | POA: Diagnosis not present

## 2022-06-13 DIAGNOSIS — M25561 Pain in right knee: Secondary | ICD-10-CM | POA: Diagnosis not present

## 2022-06-21 DIAGNOSIS — M62830 Muscle spasm of back: Secondary | ICD-10-CM | POA: Diagnosis not present

## 2022-06-21 DIAGNOSIS — M5417 Radiculopathy, lumbosacral region: Secondary | ICD-10-CM | POA: Diagnosis not present

## 2022-06-21 DIAGNOSIS — M9902 Segmental and somatic dysfunction of thoracic region: Secondary | ICD-10-CM | POA: Diagnosis not present

## 2022-06-21 DIAGNOSIS — M9903 Segmental and somatic dysfunction of lumbar region: Secondary | ICD-10-CM | POA: Diagnosis not present

## 2022-06-21 DIAGNOSIS — M5413 Radiculopathy, cervicothoracic region: Secondary | ICD-10-CM | POA: Diagnosis not present

## 2022-06-24 DIAGNOSIS — M9903 Segmental and somatic dysfunction of lumbar region: Secondary | ICD-10-CM | POA: Diagnosis not present

## 2022-06-24 DIAGNOSIS — M62830 Muscle spasm of back: Secondary | ICD-10-CM | POA: Diagnosis not present

## 2022-06-24 DIAGNOSIS — M5417 Radiculopathy, lumbosacral region: Secondary | ICD-10-CM | POA: Diagnosis not present

## 2022-06-24 DIAGNOSIS — M5413 Radiculopathy, cervicothoracic region: Secondary | ICD-10-CM | POA: Diagnosis not present

## 2022-06-24 DIAGNOSIS — M9902 Segmental and somatic dysfunction of thoracic region: Secondary | ICD-10-CM | POA: Diagnosis not present

## 2022-06-26 DIAGNOSIS — M9903 Segmental and somatic dysfunction of lumbar region: Secondary | ICD-10-CM | POA: Diagnosis not present

## 2022-06-26 DIAGNOSIS — M62830 Muscle spasm of back: Secondary | ICD-10-CM | POA: Diagnosis not present

## 2022-06-26 DIAGNOSIS — M9902 Segmental and somatic dysfunction of thoracic region: Secondary | ICD-10-CM | POA: Diagnosis not present

## 2022-06-26 DIAGNOSIS — M5417 Radiculopathy, lumbosacral region: Secondary | ICD-10-CM | POA: Diagnosis not present

## 2022-06-26 DIAGNOSIS — M5413 Radiculopathy, cervicothoracic region: Secondary | ICD-10-CM | POA: Diagnosis not present

## 2022-07-01 DIAGNOSIS — M5417 Radiculopathy, lumbosacral region: Secondary | ICD-10-CM | POA: Diagnosis not present

## 2022-07-01 DIAGNOSIS — M9903 Segmental and somatic dysfunction of lumbar region: Secondary | ICD-10-CM | POA: Diagnosis not present

## 2022-07-01 DIAGNOSIS — M62830 Muscle spasm of back: Secondary | ICD-10-CM | POA: Diagnosis not present

## 2022-07-01 DIAGNOSIS — M9902 Segmental and somatic dysfunction of thoracic region: Secondary | ICD-10-CM | POA: Diagnosis not present

## 2022-07-01 DIAGNOSIS — M5413 Radiculopathy, cervicothoracic region: Secondary | ICD-10-CM | POA: Diagnosis not present

## 2022-07-12 ENCOUNTER — Other Ambulatory Visit: Payer: Self-pay | Admitting: Family

## 2022-07-12 DIAGNOSIS — E785 Hyperlipidemia, unspecified: Secondary | ICD-10-CM

## 2022-07-18 ENCOUNTER — Telehealth: Payer: Self-pay

## 2022-07-18 ENCOUNTER — Encounter: Payer: Self-pay | Admitting: Family

## 2022-07-18 ENCOUNTER — Ambulatory Visit (INDEPENDENT_AMBULATORY_CARE_PROVIDER_SITE_OTHER): Payer: Medicare Other | Admitting: Internal Medicine

## 2022-07-18 ENCOUNTER — Encounter: Payer: Self-pay | Admitting: Internal Medicine

## 2022-07-18 VITALS — BP 122/76 | HR 87 | Temp 97.8°F | Resp 18 | Ht 68.0 in | Wt 238.4 lb

## 2022-07-18 DIAGNOSIS — B352 Tinea manuum: Secondary | ICD-10-CM

## 2022-07-18 DIAGNOSIS — E785 Hyperlipidemia, unspecified: Secondary | ICD-10-CM

## 2022-07-18 MED ORDER — PITAVASTATIN CALCIUM 4 MG PO TABS: ORAL_TABLET | ORAL | 5 refills | Status: DC

## 2022-07-18 MED ORDER — LIVALO 4 MG PO TABS: 1.0000 | ORAL_TABLET | Freq: Every day | ORAL | 1 refills | Status: DC

## 2022-07-18 MED ORDER — NAFTIFINE HCL 1 % EX CREA: TOPICAL_CREAM | Freq: Two times a day (BID) | CUTANEOUS | 1 refills | Status: DC

## 2022-07-18 NOTE — Patient Instructions (Signed)
Apply the cream twice daily for 4 weeks  Aveeno is okay.  Avoid  other OTCs

## 2022-07-18 NOTE — Telephone Encounter (Signed)
Rx changed to brand name only Livalo. Rx sent.

## 2022-07-18 NOTE — Telephone Encounter (Signed)
PA initiated via Covermymeds; KEY: [id number]. Awaiting determination.

## 2022-07-18 NOTE — Telephone Encounter (Signed)
PA cancelled by plan.   This medication or product is on your plan's list of covered drugs. Prior authorization is not required at this time. If your pharmacy has questions regarding the processing of your prescription, please have them call the [facility] pharmacy help desk at ([phone]. **Please note: This request was submitted electronically. Formulary lowering, tiering exception, cost reduction and/or pre-benefit determination review (including prospective Medicare hospice reviews) requests cannot be requested using this method of submission. Providers contact us at [phone] for further assistance.

## 2022-07-18 NOTE — Progress Notes (Signed)
Subjective:    Patient ID: [name], female    DOB: 03/02/1956, 67 y.o.   MRN: [id number]  DOS:  07/18/2022 Type of visit - description: acute, rash  Symptoms started several months ago, initially of the left index, then left palm and now is bilateral. The rash is presented initially as a dry skin and subsequently blisters. + Itching. Saw dermatology early on and there was no clear-cut diagnosis. Does not use gloves. No pets  Review of Systems See above   Past Medical History:  Diagnosis Date   Allergy    SEASONAL   Arthritis    Back pain    Benign fundic gland polyps of stomach    Cataract    EARL STAGES   Constipation    Diabetes mellitus without complication (HCC)    Esophagitis    LA Class B   Fatigue    Fatty liver    Fatty liver    Fibromyalgia    Gallbladder problem    GERD (gastroesophageal reflux disease)    Heart murmur    History of arthroscopy of right knee    Hyperlipidemia    Hypertension    IBS (irritable bowel syndrome)    Joint pain    Neuromuscular disorder (HCC)    Prediabetes     Past Surgical History:  Procedure Laterality Date   CHOLECYSTECTOMY  2009   low GB EF, no gallstones   COLONOSCOPY     LAPAROSCOPY     of adhesions/ of ovaries   UPPER GASTROINTESTINAL ENDOSCOPY     VAGINAL HYSTERECTOMY      Current Outpatient Medications  Medication Instructions   cholecalciferol (VITAMIN D3) 1,000 Units, Oral, Daily   diphenhydrAMINE-nystatin-hydrocortisone Swish and spit 5 mls 4 times daily as needed for mouth pain as directed.   fexofenadine (ALLEGRA) 60 mg, Oral, 2 times daily   Fluocinolone Acetonide Body 0.01 % OIL Topical, 3 times weekly   Glucosamine-Chondroitin (GLUCOSAMINE CHONDR COMPLEX PO) 2 capsules, Oral, Daily   hydrochlorothiazide (HYDRODIURIL) 25 MG tablet TAKE 1 TABLET(25 MG) BY MOUTH DAILY   lisinopril (ZESTRIL) 2.5 MG tablet TAKE 1 TABLET(2.5 MG) BY MOUTH DAILY   Multiple Vitamin (MULTIVITAMIN WITH MINERALS)  TABS tablet 1 tablet, Oral, Daily   omeprazole (PRILOSEC) 40 mg, Oral, Daily PRN   Pitavastatin Calcium 4 MG TABS TAKE 1 TABLET(4 MG) BY MOUTH DAILY   pramipexole (MIRAPEX) 0.75 MG tablet No dose, route, or frequency recorded.   Saccharomyces boulardii (PROBIOTIC) 250 MG CAPS Oral   Sennosides (SENOKOT PO) Oral, TAKE 4-5 PILLS PER DAY   timolol (TIMOPTIC) 0.5 % ophthalmic solution 1 drop, Left Eye, 2 times daily   vitamin C 100 mg, Oral, Daily   zinc gluconate 50 mg, Oral, Daily       Objective:   Physical Exam BP 122/76   Pulse 87   Temp 97.8 F (36.6 C) (Oral)   Resp 18   Ht 5' 8"$  (1.727 m)   Wt 238 lb 6 oz (108.1 kg)   SpO2 97%   BMI 36.24 kg/m  General:   Well developed, NAD, BMI noted. HEENT:  Normocephalic . Face symmetric, atraumatic Skin: + Rash on the hands.  No burrows.  See picture  neurologic:  alert & oriented X3.  Speech normal, gait appropriate for age and unassisted Psych--  Cognition and judgment appear intact.  Cooperative with normal attention span and concentration.  Behavior appropriate. No anxious or depressed appearing.      Assessment  67 year old female, PMH includes high cholesterol, HTN, fibromyalgia, DM, presents with:  Tinea manuum Suspect tinea, recommend naftin twice daily for 4 weeks. Avoid OTC creams except for Aveeno if needed If not better, see PCP.

## 2022-08-23 DIAGNOSIS — M9902 Segmental and somatic dysfunction of thoracic region: Secondary | ICD-10-CM | POA: Diagnosis not present

## 2022-08-23 DIAGNOSIS — M5417 Radiculopathy, lumbosacral region: Secondary | ICD-10-CM | POA: Diagnosis not present

## 2022-08-23 DIAGNOSIS — M62830 Muscle spasm of back: Secondary | ICD-10-CM | POA: Diagnosis not present

## 2022-08-23 DIAGNOSIS — M9903 Segmental and somatic dysfunction of lumbar region: Secondary | ICD-10-CM | POA: Diagnosis not present

## 2022-08-23 DIAGNOSIS — M5413 Radiculopathy, cervicothoracic region: Secondary | ICD-10-CM | POA: Diagnosis not present

## 2022-08-26 NOTE — Progress Notes (Signed)
Subjective:   By signing my name below, I, [name], attest that this documentation has been prepared under the direction and in the presence of [name], NP.  08/27/2022.   Patient ID: [name], female    DOB: 06-25-1955, 67 y.o.   MRN: [id number]  No chief complaint on file.   HPI Patient is in today for a comprehensive physical exam.    Social history: Colonoscopy:  Last completed 03/28/2022. Dexa:  Last completed 12/24/2010. Pap Smear:  Last completed 01/18/2015. Mammogram:  Last completed 04/29/2022. Immunizations:  Influenza vaccine last received 03/03/2022. Covid-19 vaccine last received 02/21/2022. Tdap last received 11/08/2011. Pneumonia vaccine received 05/15/2021. Shingrix completed 03/20/2020. Diet: Exercise: Dental: Vision:  Denies having any fever, new muscle pain, joint pain, new moles, congestion, sinus pain, sore throat, chest pain, palpitations, cough, SOB, wheezing, n/v/d, constipation, blood in stool, dysuria, frequency, hematuria, at this time.  Past Medical History:  Diagnosis Date   Allergy    SEASONAL   Arthritis    Back pain    Benign fundic gland polyps of stomach    Cataract    EARL STAGES   Constipation    Diabetes mellitus without complication (HCC)    Esophagitis    LA Class B   Fatigue    Fatty liver    Fatty liver    Fibromyalgia    Gallbladder problem    GERD (gastroesophageal reflux disease)    Heart murmur    History of arthroscopy of right knee    Hyperlipidemia    Hypertension    IBS (irritable bowel syndrome)    Joint pain    Neuromuscular disorder ([place])    Prediabetes     Past Surgical History:  Procedure Laterality Date   CHOLECYSTECTOMY  2009   low GB EF, no gallstones   COLONOSCOPY     LAPAROSCOPY     of adhesions/ of ovaries   UPPER GASTROINTESTINAL ENDOSCOPY     VAGINAL HYSTERECTOMY      Family History  Problem Relation Age of Onset   Heart disease Mother    Hypertension Mother     Hyperlipidemia Mother    Obesity Mother    Colon cancer Father    Hypertension Father    Hyperlipidemia Father    Obesity Father    Colon polyps Sister    Diabetes Sister    Diabetes Brother        Half   Heart disease Brother    Breast cancer Paternal Grandmother    Esophageal cancer Neg Hx    Stomach cancer Neg Hx    Rectal cancer Neg Hx    Crohn's disease Neg Hx    Ulcerative colitis Neg Hx     Social History   Socioeconomic History   Marital status: Single    Spouse name: Not on file   Number of children: Not on file   Years of education: Not on file   Highest education level: Not on file  Occupational History   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  Tobacco Use   Smoking status: Former    Packs/day: 0.10    Years: 10.00    Additional pack years: 0.00    Total pack years: 1.00    Types: Cigarettes    Quit date: 07/14/1981    Years since quitting: 41.1    Passive exposure: Never   Smokeless tobacco: Never  Vaping Use   Vaping Use: Never used  Substance and Sexual Activity  Alcohol use: No   Drug use: No   Sexual activity: Not Currently  Other Topics Concern   Not on file  Social History Narrative   Widowed- husband passed last year   2 grown sons, grandchildren   Works for a Museum/gallery curator, Engineer, maintenance (IT)   No pets   Enjoys writing, reading, being a grandmother, walking   Engineer, 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     cholecalciferol (VITAMIN D3) 25 MCG (1000 UNIT) tablet Take 1,000 Units by  mouth daily.     diphenhydrAMINE-nystatin-hydrocortisone Swish and spit 5 mls 4 times daily as needed for mouth pain as directed. 240 mL 1   fexofenadine (ALLEGRA) 60 MG tablet Take 60 mg by mouth 2 (two) times daily.     Fluocinolone Acetonide Body 0.01 % OIL Apply topically 3 (three) times a week.     Glucosamine-Chondroitin (GLUCOSAMINE CHONDR COMPLEX PO) Take 2 capsules by mouth daily.     hydrochlorothiazide (HYDRODIURIL) 25 MG tablet TAKE 1 TABLET(25 MG) BY MOUTH DAILY 90 tablet 1   lisinopril (ZESTRIL) 2.5 MG tablet TAKE 1 TABLET(2.5 MG) BY MOUTH DAILY 90 tablet 1   LIVALO 4 MG TABS Take 1 tablet (4 mg total) by mouth daily. 90 tablet 1   Multiple Vitamin (MULTIVITAMIN WITH MINERALS) TABS tablet Take 1 tablet by mouth daily.     naftifine (NAFTIN) 1 % cream Apply topically 2 (two) times daily. 60 g 1   omeprazole (PRILOSEC) 40 MG capsule Take 1 capsule (40 mg total) by mouth daily as needed. 30 capsule 3   pramipexole (MIRAPEX) 0.75 MG tablet      Saccharomyces boulardii (PROBIOTIC) 250 MG CAPS Take by mouth.     Sennosides (SENOKOT PO) Take by mouth. TAKE 4-5 PILLS PER DAY     timolol (TIMOPTIC) 0.5 % ophthalmic solution Place 1 drop into the left eye 2 (two) times daily.     zinc gluconate 50 MG tablet Take 50 mg by mouth daily.     No facility-administered medications prior to visit.    Allergies  Allergen Reactions   Metformin And Related     abd pain   Rosuvastatin Other (See Comments)    Muscle aches    Review of Systems  Constitutional:  Negative for fever.  HENT:  Negative for congestion, sinus pain and sore throat.   Respiratory:  Negative for cough, shortness of breath and wheezing.   Cardiovascular:  Negative for chest pain and palpitations.  Gastrointestinal:  Negative for blood in stool, constipation, diarrhea, nausea and vomiting.  Genitourinary:  Negative for dysuria, frequency and hematuria.  Musculoskeletal:  Negative for joint pain and myalgias.  Skin:         (-) New moles.       Objective:    Physical Exam Constitutional:      Appearance: Normal appearance.  HENT:     Head: Normocephalic and atraumatic.     Right Ear: Tympanic membrane, ear canal and external ear normal.  Left Ear: Tympanic membrane, ear canal and external ear normal.  Eyes:     Extraocular Movements: Extraocular movements intact.     Pupils: Pupils are equal, round, and reactive to light.  Cardiovascular:     Rate and Rhythm: Normal rate and regular rhythm.     Heart sounds: Normal heart sounds. No murmur heard.    No gallop.  Pulmonary:     Effort: Pulmonary effort is normal. No respiratory distress.     Breath sounds: Normal breath sounds. No wheezing or rales.  Abdominal:     General: Bowel sounds are normal. There is no distension.     Palpations: Abdomen is soft.     Tenderness: There is no abdominal tenderness. There is no guarding.  Musculoskeletal:        General: Normal range of motion.  Skin:    General: Skin is warm and dry.  Neurological:     General: No focal deficit present.     Mental Status: She is alert and oriented to person, place, and time.  Psychiatric:        Mood and Affect: Mood normal.        Behavior: Behavior normal.     There were no vitals taken for this visit. Wt Readings from Last 3 Encounters:  07/18/22 238 lb 6 oz (108.1 kg)  04/29/22 234 lb (106.1 kg)  03/28/22 235 lb (106.6 kg)    Diabetic Foot Exam - Simple   No data filed    Lab Results  Component Value Date   WBC 5.4 12/17/2021   HGB 13.6 12/17/2021   HCT 39.3 12/17/2021   PLT 196.0 12/17/2021   GLUCOSE 136 (H) 04/29/2022   CHOL 162 04/17/2021   TRIG 126.0 04/17/2021   HDL 45.90 04/17/2021   LDLDIRECT 174.0 05/25/2019   LDLCALC 91 04/17/2021   ALT 31 04/29/2022   AST 12 04/29/2022   NA 134 (L) 04/29/2022   K 4.3 04/29/2022   CL 97 04/29/2022   CREATININE 0.75 04/29/2022   BUN 14 04/29/2022   CO2 29 04/29/2022   TSH 0.57 06/05/2020   INR  0.9 12/17/2021   HGBA1C 7.2 (H) 04/29/2022   MICROALBUR 1.0 04/29/2022    Lab Results  Component Value Date   TSH 0.57 06/05/2020   Lab Results  Component Value Date   WBC 5.4 12/17/2021   HGB 13.6 12/17/2021   HCT 39.3 12/17/2021   MCV 88.6 12/17/2021   PLT 196.0 12/17/2021   Lab Results  Component Value Date   NA 134 (L) 04/29/2022   K 4.3 04/29/2022   CO2 29 04/29/2022   GLUCOSE 136 (H) 04/29/2022   BUN 14 04/29/2022   CREATININE 0.75 04/29/2022   BILITOT 0.8 04/29/2022   ALKPHOS 74 04/29/2022   AST 12 04/29/2022   ALT 31 04/29/2022   PROT 7.3 04/29/2022   ALBUMIN 4.2 04/29/2022   CALCIUM 10.0 04/29/2022   GFR 82.38 04/29/2022   Lab Results  Component Value Date   CHOL 162 04/17/2021   Lab Results  Component Value Date   HDL 45.90 04/17/2021   Lab Results  Component Value Date   LDLCALC 91 04/17/2021   Lab Results  Component Value Date   TRIG 126.0 04/17/2021   Lab Results  Component Value Date   CHOLHDL 4 04/17/2021   Lab Results  Component Value Date   HGBA1C 7.2 (H) 04/29/2022       Assessment & Plan:   Problem List Items Addressed  This Visit   None    No orders of the defined types were placed in this encounter.   Alphonzo Grieve, personally preformed the services described in this documentation.  All medical record entries made by the scribe were at my direction and in my presence.  I have reviewed the chart and discharge instructions (if applicable) and agree that the record reflects my personal performance and is accurate and complete. 08/27/2022.  I,Mathew Stumpf,acting as a Education administrator for [employer], NP.,have documented all relevant documentation on the behalf of Nance Pear, NP,as directed by  Nance Pear, NP while in the presence of Nance Pear, NP.   [name]

## 2022-08-27 ENCOUNTER — Other Ambulatory Visit (HOSPITAL_BASED_OUTPATIENT_CLINIC_OR_DEPARTMENT_OTHER): Payer: Self-pay

## 2022-08-27 ENCOUNTER — Ambulatory Visit (INDEPENDENT_AMBULATORY_CARE_PROVIDER_SITE_OTHER): Payer: Medicare Other | Admitting: Family

## 2022-08-27 ENCOUNTER — Encounter: Payer: Self-pay | Admitting: Family

## 2022-08-27 VITALS — BP 128/75 | HR 82 | Temp 97.7°F | Resp 16 | Ht 68.0 in | Wt 235.0 lb

## 2022-08-27 DIAGNOSIS — R809 Proteinuria, unspecified: Secondary | ICD-10-CM

## 2022-08-27 DIAGNOSIS — R300 Dysuria: Secondary | ICD-10-CM

## 2022-08-27 DIAGNOSIS — I10 Essential (primary) hypertension: Secondary | ICD-10-CM

## 2022-08-27 DIAGNOSIS — Z832 Family history of diseases of the blood and blood-forming organs and certain disorders involving the immune mechanism: Secondary | ICD-10-CM

## 2022-08-27 DIAGNOSIS — Z0000 Encounter for general adult medical examination without abnormal findings: Secondary | ICD-10-CM

## 2022-08-27 DIAGNOSIS — Z0001 Encounter for general adult medical examination with abnormal findings: Secondary | ICD-10-CM | POA: Diagnosis not present

## 2022-08-27 DIAGNOSIS — E785 Hyperlipidemia, unspecified: Secondary | ICD-10-CM

## 2022-08-27 DIAGNOSIS — K219 Gastro-esophageal reflux disease without esophagitis: Secondary | ICD-10-CM

## 2022-08-27 DIAGNOSIS — R21 Rash and other nonspecific skin eruption: Secondary | ICD-10-CM | POA: Diagnosis not present

## 2022-08-27 DIAGNOSIS — Z780 Asymptomatic menopausal state: Secondary | ICD-10-CM

## 2022-08-27 DIAGNOSIS — E559 Vitamin D deficiency, unspecified: Secondary | ICD-10-CM | POA: Diagnosis not present

## 2022-08-27 DIAGNOSIS — E1129 Type 2 diabetes mellitus with other diabetic kidney complication: Secondary | ICD-10-CM | POA: Diagnosis not present

## 2022-08-27 LAB — URINALYSIS, ROUTINE W REFLEX MICROSCOPIC
Bilirubin Urine: NEGATIVE
Ketones, ur: NEGATIVE
Nitrite: POSITIVE — AB
Specific Gravity, Urine: 1.02 (ref 1.000–1.030)
Total Protein, Urine: NEGATIVE
Urine Glucose: NEGATIVE
Urobilinogen, UA: 0.2 (ref 0.0–1.0)
pH: 6.5 (ref 5.0–8.0)

## 2022-08-27 LAB — COMPREHENSIVE METABOLIC PANEL
ALT: 33 U/L (ref 0–35)
AST: 15 U/L (ref 0–37)
Albumin: 4.2 g/dL (ref 3.5–5.2)
Alkaline Phosphatase: 66 U/L (ref 39–117)
BUN: 14 mg/dL (ref 6–23)
CO2: 30 mEq/L (ref 19–32)
Calcium: 9.5 mg/dL (ref 8.4–10.5)
Chloride: 101 mEq/L (ref 96–112)
Creatinine, Ser: 0.84 mg/dL (ref 0.40–1.20)
GFR: 71.74 mL/min (ref >60.00)
Glucose, Bld: 136 mg/dL — ABNORMAL HIGH (ref 70–99)
Potassium: 3.9 mEq/L (ref 3.5–5.1)
Sodium: 140 mEq/L (ref 135–145)
Total Bilirubin: 0.5 mg/dL (ref 0.2–1.2)
Total Protein: 7.2 g/dL (ref 6.0–8.3)

## 2022-08-27 LAB — VITAMIN D 25 HYDROXY (VIT D DEFICIENCY, FRACTURES): VITD: 48.59 ng/mL (ref 30.00–100.00)

## 2022-08-27 LAB — LIPID PANEL
Cholesterol: 219 mg/dL — ABNORMAL HIGH (ref 0–200)
HDL: 54.6 mg/dL
LDL Cholesterol: 130 mg/dL — ABNORMAL HIGH (ref 0–99)
NonHDL: 164.29
Total CHOL/HDL Ratio: 4
Triglycerides: 173 mg/dL — ABNORMAL HIGH (ref 0.0–149.0)
VLDL: 34.6 mg/dL (ref 0.0–40.0)

## 2022-08-27 LAB — HEMOGLOBIN A1C: Hgb A1c MFr Bld: 7 % — ABNORMAL HIGH (ref 4.6–6.5)

## 2022-08-27 MED ORDER — MOUNJARO 2.5 MG/0.5ML ~~LOC~~ SOAJ
2.5000 mg | SUBCUTANEOUS | 0 refills | Status: DC
  Filled 2022-08-27: qty 2, 28d supply, fill #0

## 2022-08-27 MED ORDER — CLOTRIMAZOLE-BETAMETHASONE 1-0.05 % EX CREA: 1.0000 | TOPICAL_CREAM | Freq: Two times a day (BID) | CUTANEOUS | 1 refills | Status: DC

## 2022-08-27 NOTE — Assessment & Plan Note (Signed)
Stable with prn use of omeprazole. Continue same.  

## 2022-08-27 NOTE — Assessment & Plan Note (Signed)
Patient reports that A1C done by insurance nurse was 9.0.  Will recheck here today.  She is interested in [facility] for weight loss and DM management. Will see if we can get this approved for her.

## 2022-08-27 NOTE — Assessment & Plan Note (Signed)
Using OTC vit D. Recheck level.

## 2022-08-27 NOTE — Assessment & Plan Note (Signed)
Discussed healthy diet, exercise, weight.  Mammo and colo up to date. Encouraged her to get RSV and Tetanus at her pharmacy.

## 2022-08-27 NOTE — Assessment & Plan Note (Signed)
BP stable on hctz and lisinopril, continue same.

## 2022-08-27 NOTE — Assessment & Plan Note (Signed)
Lab Results  Component Value Date   CHOL 162 04/17/2021   HDL 45.90 04/17/2021   LDLCALC 91 04/17/2021   LDLDIRECT 174.0 05/25/2019   TRIG 126.0 04/17/2021   CHOLHDL 4 04/17/2021   Maintained on livalo due for lipid panel.

## 2022-08-28 LAB — SICKLE CELL SCREEN: Sickle Solubility Test - HGBRFX: NEGATIVE

## 2022-08-29 ENCOUNTER — Telehealth: Payer: Self-pay | Admitting: *Deleted

## 2022-08-29 ENCOUNTER — Telehealth: Payer: Self-pay | Admitting: Family

## 2022-08-29 DIAGNOSIS — M9902 Segmental and somatic dysfunction of thoracic region: Secondary | ICD-10-CM | POA: Diagnosis not present

## 2022-08-29 DIAGNOSIS — M9903 Segmental and somatic dysfunction of lumbar region: Secondary | ICD-10-CM | POA: Diagnosis not present

## 2022-08-29 DIAGNOSIS — M5417 Radiculopathy, lumbosacral region: Secondary | ICD-10-CM | POA: Diagnosis not present

## 2022-08-29 DIAGNOSIS — M5413 Radiculopathy, cervicothoracic region: Secondary | ICD-10-CM | POA: Diagnosis not present

## 2022-08-29 DIAGNOSIS — M62830 Muscle spasm of back: Secondary | ICD-10-CM | POA: Diagnosis not present

## 2022-08-29 MED ORDER — CEPHALEXIN 500 MG PO CAPS: 500.0000 mg | ORAL_CAPSULE | Freq: Two times a day (BID) | ORAL | 0 refills | Status: AC

## 2022-08-29 NOTE — Telephone Encounter (Signed)
Patient notified of results and rx

## 2022-08-29 NOTE — Telephone Encounter (Signed)
Urine culture shows UTI. Please begin keflex.

## 2022-08-30 ENCOUNTER — Other Ambulatory Visit (HOSPITAL_BASED_OUTPATIENT_CLINIC_OR_DEPARTMENT_OTHER): Payer: Self-pay

## 2022-08-30 LAB — URINE CULTURE
MICRO NUMBER:: 14742841
SPECIMEN QUALITY:: ADEQUATE

## 2022-09-02 ENCOUNTER — Other Ambulatory Visit (HOSPITAL_BASED_OUTPATIENT_CLINIC_OR_DEPARTMENT_OTHER): Payer: Medicare Other

## 2022-09-02 ENCOUNTER — Other Ambulatory Visit (HOSPITAL_BASED_OUTPATIENT_CLINIC_OR_DEPARTMENT_OTHER): Payer: Self-pay

## 2022-09-02 NOTE — Telephone Encounter (Deleted)
Message from Plan Request Reference Number: [id number]. OZEMPIC INJ 2MG /3ML is approved through 08/29/2023. For further questions, call [employer] at [phone].Marland Kitchen Authorization Expiration Date: August 29, 2023.

## 2022-09-03 ENCOUNTER — Other Ambulatory Visit (HOSPITAL_BASED_OUTPATIENT_CLINIC_OR_DEPARTMENT_OTHER): Payer: Self-pay

## 2022-09-03 ENCOUNTER — Telehealth (INDEPENDENT_AMBULATORY_CARE_PROVIDER_SITE_OTHER): Payer: Medicare Other | Admitting: Family Medicine

## 2022-09-03 DIAGNOSIS — R809 Proteinuria, unspecified: Secondary | ICD-10-CM | POA: Diagnosis not present

## 2022-09-03 DIAGNOSIS — E1129 Type 2 diabetes mellitus with other diabetic kidney complication: Secondary | ICD-10-CM | POA: Diagnosis not present

## 2022-09-03 DIAGNOSIS — U071 COVID-19: Secondary | ICD-10-CM | POA: Diagnosis not present

## 2022-09-03 MED ORDER — NIRMATRELVIR/RITONAVIR (PAXLOVID)TABLET: 3.0000 | ORAL_TABLET | Freq: Two times a day (BID) | ORAL | 0 refills | Status: AC

## 2022-09-03 MED ORDER — BENZONATATE 100 MG PO CAPS: 100.0000 mg | ORAL_CAPSULE | Freq: Three times a day (TID) | ORAL | 0 refills | Status: DC | PRN

## 2022-09-03 NOTE — Progress Notes (Shared)
MyChart Video Visit Virtual Visit via Video Note   This visit type was conducted due to national recommendations for restrictions regarding the COVID-19 Pandemic (e.g. social distancing) in an effort to limit this patient's exposure and mitigate transmission in our community. This patient is at least at moderate risk for complications without adequate follow up. This format is felt to be most appropriate for this patient at this time. Physical exam was limited by quality of the video and audio technology used for the visit. [name], CMA, was able to get the patient set up on a video visit.  Patient location: Patient's home Provider location: Office  I discussed the limitations of evaluation and management by telemedicine and the availability of in person appointments. The patient expressed understanding and agreed to proceed.  Visit Date: 09/03/2022  Today's healthcare provider: Penni Homans, MD     Subjective:    Patient ID: [name], female    DOB: [birth year], 67 y.o.   MRN: [id number]  Chief Complaint  Patient presents with   Covid Positive    Covid Positive 2 days ago.   HPI Patient is in today for a virtual visit.  COVID-19 Patient reports that she tested positive for COVID-19 yesterday, 09/02/2022, but has been feeling symptomatic since Sunday, 09/01/2022. She complains of chills/weakness/cough/headache/myalgias/ and loss of appetite but denies chest pain or palpitations.  Past Medical History:  Diagnosis Date   Allergy    SEASONAL   Arthritis    Back pain    Benign fundic gland polyps of stomach    Cataract    EARL STAGES   Constipation    Diabetes mellitus without complication (HCC)    Esophagitis    LA Class B   Fatigue    Fatty liver    Fibromyalgia    Gallbladder problem    GERD (gastroesophageal reflux disease)    Heart murmur    History of arthroscopy of right knee    Hyperlipidemia    Hypertension    IBS (irritable bowel syndrome)    Joint  pain    Neuromuscular disorder ([place])    Prediabetes     Past Surgical History:  Procedure Laterality Date   CHOLECYSTECTOMY  2009   low GB EF, no gallstones   COLONOSCOPY     knee arthroscopy right  04/22/2022   LAPAROSCOPY     of adhesions/ of ovaries   UPPER GASTROINTESTINAL ENDOSCOPY     VAGINAL HYSTERECTOMY      Family History  Problem Relation Age of Onset   Heart disease Mother    Hypertension Mother    Hyperlipidemia Mother    Obesity Mother    Colon cancer Father    Hypertension Father    Hyperlipidemia Father    Obesity Father    Colon polyps Sister    Diabetes Sister    Diabetes Brother        Half   Heart disease Brother    Hypertension Brother    Sickle cell anemia Maternal Grandmother    Breast cancer Paternal Grandmother    Sickle cell trait Son    Esophageal cancer Neg Hx    Stomach cancer Neg Hx    Rectal cancer Neg Hx    Crohn's disease Neg Hx    Ulcerative colitis Neg Hx     Social History   Socioeconomic History   Marital status: Single    Spouse name: Not on file   Number of children: Not on file  Years of education: Not on file   Highest education level: Not on file  Occupational History   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 [employer]  Tobacco Use   Smoking status: Former    Packs/day: 0.10    Years: 10.00    Additional pack years: 0.00    Total pack years: 1.00    Types: Cigarettes    Quit date: 07/14/1981    Years since quitting: 41.1    Passive exposure: Never   Smokeless tobacco: Never  Vaping Use   Vaping Use: Never used  Substance and Sexual Activity   Alcohol use: No   Drug use: No   Sexual activity: Not Currently  Other Topics Concern   Not on file  Social History Narrative   Widowed- husband passed last year   2 grown sons, grandchildren   Works for a Museum/gallery curator, Engineer, maintenance (IT)- retired   No pets   Enjoys writing, reading, being a grandmother, walking   Engineer, 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     cephALEXin (KEFLEX) 500 MG capsule Take 1 capsule (500 mg total) by mouth 2 (two) times daily for 5 days. 10 capsule 0   cholecalciferol (VITAMIN D3) 25 MCG (1000 UNIT) tablet Take 1,000 Units by mouth daily.     clotrimazole-betamethasone (LOTRISONE) cream Apply 1 Application topically 2 (two) times daily. 30 g 1   diphenhydrAMINE-nystatin-hydrocortisone Swish and spit 5 mls 4 times daily as needed for mouth pain as directed. 240 mL 1   fexofenadine (ALLEGRA) 60 MG tablet Take 60 mg by mouth 2 (two) times daily.     Glucosamine-Chondroitin (GLUCOSAMINE CHONDR COMPLEX PO) Take 2 capsules by mouth daily.     hydrochlorothiazide (HYDRODIURIL) 25 MG tablet TAKE 1 TABLET(25 MG) BY MOUTH DAILY 90 tablet 1   lisinopril (ZESTRIL) 2.5 MG tablet TAKE 1 TABLET(2.5 MG) BY MOUTH DAILY 90 tablet 1   LIVALO 4 MG TABS Take 1 tablet (4 mg total) by mouth daily. 90 tablet 1   Multiple Vitamin (MULTIVITAMIN WITH MINERALS) TABS tablet Take 1 tablet by mouth daily.     naftifine (NAFTIN) 1 % cream Apply topically 2 (two) times daily. 60 g 1   omeprazole (PRILOSEC) 40 MG capsule Take 1 capsule (40 mg total) by mouth daily as needed. 30 capsule 3   pramipexole (MIRAPEX) 0.75 MG tablet      Saccharomyces boulardii (PROBIOTIC) 250 MG CAPS Take by mouth.     Sennosides (SENOKOT PO) Take by mouth. TAKE 4-5 PILLS PER DAY     timolol (TIMOPTIC) 0.5 % ophthalmic solution Place 1 drop into the left eye 2 (two) times daily.     tirzepatide [facility]) 2.5 MG/0.5ML Pen  Inject 2.5 mg into the skin once a week. 2 mL 0   zinc gluconate 50 MG tablet Take 50 mg by mouth daily.     No facility-administered medications prior to visit.    Allergies  Allergen Reactions   Metformin And Related  abd pain   Rosuvastatin Other (See Comments)    Muscle aches    Review of Systems  Constitutional:  Positive for fever and malaise/fatigue.  Respiratory:  Positive for cough.   Cardiovascular:  Negative for chest pain and palpitations.  Musculoskeletal:  Positive for myalgias.  Neurological:  Positive for weakness and headaches.       Objective:    Physical Exam  There were no vitals taken for this visit. Wt Readings from Last 3 Encounters:  08/27/22 235 lb (106.6 kg)  07/18/22 238 lb 6 oz (108.1 kg)  04/29/22 234 lb (106.1 kg)    Diabetic Foot Exam - Simple   No data filed    Lab Results  Component Value Date   WBC 5.4 12/17/2021   HGB 13.6 12/17/2021   HCT 39.3 12/17/2021   PLT 196.0 12/17/2021   GLUCOSE 136 (H) 08/27/2022   CHOL 219 (H) 08/27/2022   TRIG 173.0 (H) 08/27/2022   HDL 54.60 08/27/2022   LDLDIRECT 174.0 05/25/2019   LDLCALC 130 (H) 08/27/2022   ALT 33 08/27/2022   AST 15 08/27/2022   NA 140 08/27/2022   K 3.9 08/27/2022   CL 101 08/27/2022   CREATININE 0.84 08/27/2022   BUN 14 08/27/2022   CO2 30 08/27/2022   TSH 0.57 06/05/2020   INR 0.9 12/17/2021   HGBA1C 7.0 (H) 08/27/2022   MICROALBUR 1.0 04/29/2022    Lab Results  Component Value Date   TSH 0.57 06/05/2020   Lab Results  Component Value Date   WBC 5.4 12/17/2021   HGB 13.6 12/17/2021   HCT 39.3 12/17/2021   MCV 88.6 12/17/2021   PLT 196.0 12/17/2021   Lab Results  Component Value Date   NA 140 08/27/2022   K 3.9 08/27/2022   CO2 30 08/27/2022   GLUCOSE 136 (H) 08/27/2022   BUN 14 08/27/2022   CREATININE 0.84 08/27/2022   BILITOT 0.5 08/27/2022   ALKPHOS 66 08/27/2022   AST 15 08/27/2022   ALT 33 08/27/2022   PROT 7.2 08/27/2022   ALBUMIN  4.2 08/27/2022   CALCIUM 9.5 08/27/2022   GFR 71.74 08/27/2022   Lab Results  Component Value Date   CHOL 219 (H) 08/27/2022   Lab Results  Component Value Date   HDL 54.60 08/27/2022   Lab Results  Component Value Date   LDLCALC 130 (H) 08/27/2022   Lab Results  Component Value Date   TRIG 173.0 (H) 08/27/2022   Lab Results  Component Value Date   CHOLHDL 4 08/27/2022   Lab Results  Component Value Date   HGBA1C 7.0 (H) 08/27/2022      Assessment & Plan:  COVID-19: Encouraged patient to hydrate, eat more protein, and rest. Prescribed Benzonatate 100 mg and Paxlovid. Problem List Items Addressed This Visit   None  No orders of the defined types were placed in this encounter.  I discussed the assessment and treatment plan with the patient. The patient was provided an opportunity to ask questions and all were answered. The patient agreed with the plan and demonstrated an understanding of the instructions.   The patient was advised to call back or seek an in-person evaluation if the symptoms worsen or if the condition fails to improve as anticipated.  I provided 20 minutes of face-to-face time during this encounter.  I,Mohammed Iqbal,acting as a scribe for Penni Homans, MD.,have documented all relevant documentation on the behalf of Penni Homans, MD,as directed by  Penni Homans, MD while in  the presence of Penni Homans, MD.  Penni Homans, MD [facility] [phone] (phone) [phone] (fax)  [place]

## 2022-09-03 NOTE — Telephone Encounter (Signed)
Approved on April 1 Approved. Authorization Expiration Date: 09/02/2023

## 2022-09-04 ENCOUNTER — Encounter: Payer: Self-pay | Admitting: Family Medicine

## 2022-09-04 ENCOUNTER — Telehealth: Payer: Self-pay | Admitting: Family

## 2022-09-04 DIAGNOSIS — U071 COVID-19: Secondary | ICD-10-CM

## 2022-09-04 HISTORY — DX: COVID-19: U07.1

## 2022-09-04 NOTE — Assessment & Plan Note (Signed)
minimize simple carbs. Increase exercise as tolerated. Continue current meds  

## 2022-09-04 NOTE — Telephone Encounter (Signed)
Contacted [name] to schedule their annual wellness visit. Appointment made for 09/11/2022.  Sherol Dade; Care Guide [facility] Direct Dial: [phone]

## 2022-09-04 NOTE — Assessment & Plan Note (Signed)
She started having symptoms on 3/31 and is worsening with excessive fatigue, cough and  malaise. With numerous risk factors she will be treated with Paxlovid and Tessalon perles. Encouraged increased rest and hydration, add probiotics, zinc such as Coldeze or Xicam. Treat fevers as needed

## 2022-09-10 ENCOUNTER — Ambulatory Visit (HOSPITAL_BASED_OUTPATIENT_CLINIC_OR_DEPARTMENT_OTHER)
Admission: RE | Admit: 2022-09-10 | Discharge: 2022-09-10 | Disposition: A | Discharge status: Home / Self Care | Payer: Medicare Other | Source: Ambulatory Visit | Attending: Family | Admitting: Family

## 2022-09-10 DIAGNOSIS — Z780 Asymptomatic menopausal state: Secondary | ICD-10-CM | POA: Insufficient documentation

## 2022-09-11 ENCOUNTER — Ambulatory Visit (INDEPENDENT_AMBULATORY_CARE_PROVIDER_SITE_OTHER): Payer: Medicare Other | Admitting: *Deleted

## 2022-09-11 VITALS — Ht 68.0 in | Wt 237.0 lb

## 2022-09-11 DIAGNOSIS — Z0000 Encounter for general adult medical examination without abnormal findings: Secondary | ICD-10-CM

## 2022-09-11 NOTE — Patient Instructions (Signed)
[name] , Thank you for taking time to come for your Medicare Wellness Visit. I appreciate your ongoing commitment to your health goals. Please review the following plan we discussed and let me know if I can assist you in the future.   These are the goals we discussed:  Goals   None     This is a list of the screening recommended for you and due dates:  Health Maintenance  Topic Date Due   DTaP/Tdap/Td vaccine (2 - Td or Tdap) 11/07/2021   COVID-19 Vaccine (6 - 2023-24 season) 01/16/2023*   Flu Shot  01/02/2023   Hemoglobin A1C  02/27/2023   Yearly kidney health urinalysis for diabetes  04/30/2023   Eye exam for diabetics  05/07/2023   Yearly kidney function blood test for diabetes  08/27/2023   Complete foot exam   08/27/2023   Medicare Annual Wellness Visit  09/11/2023   Mammogram  04/29/2024   Colon Cancer Screening  03/29/2027   Pneumonia Vaccine  Completed   DEXA scan (bone density measurement)  Completed   Hepatitis C Screening: USPSTF Recommendation to screen - Ages 45-79 yo.  Completed   Zoster (Shingles) Vaccine  Completed   HPV Vaccine  Aged Out  *Topic was postponed. The date shown is not the original due date.     Next appointment: Follow up in one year for your annual wellness visit.   Preventive Care 53 Years and Older, Female Preventive care refers to lifestyle choices and visits with your health care provider that can promote health and wellness. What does preventive care include? A yearly physical exam. This is also called an annual well check. Dental exams once or twice a year. Routine eye exams. Ask your health care provider how often you should have your eyes checked. Personal lifestyle choices, including: Daily care of your teeth and gums. Regular physical activity. Eating a healthy diet. Avoiding tobacco and drug use. Limiting alcohol use. Practicing safe sex. Taking low-dose aspirin every day. Taking vitamin and mineral supplements as  recommended by your health care provider. What happens during an annual well check? The services and screenings done by your health care provider during your annual well check will depend on your age, overall health, lifestyle risk factors, and family history of disease. Counseling  Your health care provider may ask you questions about your: Alcohol use. Tobacco use. Drug use. Emotional well-being. Home and relationship well-being. Sexual activity. Eating habits. History of falls. Memory and ability to understand (cognition). Work and work Astronomer. Reproductive health. Screening  You may have the following tests or measurements: Height, weight, and BMI. Blood pressure. Lipid and cholesterol levels. These may be checked every 5 years, or more frequently if you are over 72 years old. Skin check. Lung cancer screening. You may have this screening every year starting at age 78 if you have a 30-pack-year history of smoking and currently smoke or have quit within the past 15 years. Fecal occult blood test (FOBT) of the stool. You may have this test every year starting at age 45. Flexible sigmoidoscopy or colonoscopy. You may have a sigmoidoscopy every 5 years or a colonoscopy every 10 years starting at age 51. Hepatitis C blood test. Hepatitis B blood test. Sexually transmitted disease (STD) testing. Diabetes screening. This is done by checking your blood sugar (glucose) after you have not eaten for a while (fasting). You may have this done every 1-3 years. Bone density scan. This is done to screen for osteoporosis.  You may have this done starting at age 61. Mammogram. This may be done every 1-2 years. Talk to your health care provider about how often you should have regular mammograms. Talk with your health care provider about your test results, treatment options, and if necessary, the need for more tests. Vaccines  Your health care provider may recommend certain vaccines, such  as: Influenza vaccine. This is recommended every year. Tetanus, diphtheria, and acellular pertussis (Tdap, Td) vaccine. You may need a Td booster every 10 years. Zoster vaccine. You may need this after age 32. Pneumococcal 13-valent conjugate (PCV13) vaccine. One dose is recommended after age 66. Pneumococcal polysaccharide (PPSV23) vaccine. One dose is recommended after age 61. Talk to your health care provider about which screenings and vaccines you need and how often you need them. This information is not intended to replace advice given to you by your health care provider. Make sure you discuss any questions you have with your health care provider. Document Released: 06/16/2015 Document Revised: 02/07/2016 Document Reviewed: 03/21/2015 Elsevier Interactive Patient Education  2017 [place] Prevention in the Home Falls can cause injuries. They can happen to people of all ages. There are many things you can do to make your home safe and to help prevent falls. What can I do on the outside of my home? Regularly fix the edges of walkways and driveways and fix any cracks. Remove anything that might make you trip as you walk through a door, such as a raised step or threshold. Trim any bushes or trees on the path to your home. Use bright outdoor lighting. Clear any walking paths of anything that might make someone trip, such as rocks or tools. Regularly check to see if handrails are loose or broken. Make sure that both sides of any steps have handrails. Any raised decks and porches should have guardrails on the edges. Have any leaves, snow, or ice cleared regularly. Use sand or salt on walking paths during winter. Clean up any spills in your garage right away. This includes oil or grease spills. What can I do in the bathroom? Use night lights. Install grab bars by the toilet and in the tub and shower. Do not use towel bars as grab bars. Use non-skid mats or decals in the tub or  shower. If you need to sit down in the shower, use a plastic, non-slip stool. Keep the floor dry. Clean up any water that spills on the floor as soon as it happens. Remove soap buildup in the tub or shower regularly. Attach bath mats securely with double-sided non-slip rug tape. Do not have throw rugs and other things on the floor that can make you trip. What can I do in the bedroom? Use night lights. Make sure that you have a light by your bed that is easy to reach. Do not use any sheets or blankets that are too big for your bed. They should not hang down onto the floor. Have a firm chair that has side arms. You can use this for support while you get dressed. Do not have throw rugs and other things on the floor that can make you trip. What can I do in the kitchen? Clean up any spills right away. Avoid walking on wet floors. Keep items that you use a lot in easy-to-reach places. If you need to reach something above you, use a strong step stool that has a grab bar. Keep electrical cords out of the way. Do not use  floor polish or wax that makes floors slippery. If you must use wax, use non-skid floor wax. Do not have throw rugs and other things on the floor that can make you trip. What can I do with my stairs? Do not leave any items on the stairs. Make sure that there are handrails on both sides of the stairs and use them. Fix handrails that are broken or loose. Make sure that handrails are as long as the stairways. Check any carpeting to make sure that it is firmly attached to the stairs. Fix any carpet that is loose or worn. Avoid having throw rugs at the top or bottom of the stairs. If you do have throw rugs, attach them to the floor with carpet tape. Make sure that you have a light switch at the top of the stairs and the bottom of the stairs. If you do not have them, ask someone to add them for you. What else can I do to help prevent falls? Wear shoes that: Do not have high heels. Have  rubber bottoms. Are comfortable and fit you well. Are closed at the toe. Do not wear sandals. If you use a stepladder: Make sure that it is fully opened. Do not climb a closed stepladder. Make sure that both sides of the stepladder are locked into place. Ask someone to hold it for you, if possible. Clearly mark and make sure that you can see: Any grab bars or handrails. First and last steps. Where the edge of each step is. Use tools that help you move around (mobility aids) if they are needed. These include: Canes. Walkers. Scooters. Crutches. Turn on the lights when you go into a dark area. Replace any light bulbs as soon as they burn out. Set up your furniture so you have a clear path. Avoid moving your furniture around. If any of your floors are uneven, fix them. If there are any pets around you, be aware of where they are. Review your medicines with your doctor. Some medicines can make you feel dizzy. This can increase your chance of falling. Ask your doctor what other things that you can do to help prevent falls. This information is not intended to replace advice given to you by your health care provider. Make sure you discuss any questions you have with your health care provider. Document Released: 03/16/2009 Document Revised: 10/26/2015 Document Reviewed: 06/24/2014 Elsevier Interactive Patient Education  2017 [employer].

## 2022-09-11 NOTE — Progress Notes (Signed)
Subjective:   Renee BrothersJoyce F Wiley is a 67 y.o. female who presents for an Initial Medicare Annual Wellness Visit.  I connected with  [name] on 09/11/22 by a audio enabled telemedicine application and verified that I am speaking with the correct person using two identifiers.  Patient Location: Home  Provider Location: Office/Clinic  I discussed the limitations of evaluation and management by telemedicine. The patient expressed understanding and agreed to proceed.   Review of Systems     Cardiac Risk Factors include: advanced age (>2855men, 35>65 women);diabetes mellitus;dyslipidemia;hypertension     Objective:    Today's Vitals   09/11/22 1100  Weight: 237 lb (107.5 kg)  Height: 5\' 8"  (1.727 m)   Body mass index is 36.04 kg/m.     09/11/2022   11:01 AM 05/21/2020    7:20 AM 03/09/2020    2:12 PM 12/27/2016    1:09 PM 10/29/2016    1:13 PM  Advanced Directives  Does Patient Have a Medical Advance Directive? Yes Yes Yes Yes Yes  Type of Estate agentAdvance Directive Healthcare Power of TebbettsAttorney;Living will   Healthcare Power of eBayttorney Healthcare Power of [place]orney;Living will  Copy of Healthcare Power of Attorney in Chart? No - copy requested    No - copy requested    Current Medications (verified) Outpatient Encounter Medications as of 09/11/2022  Medication Sig   Ascorbic Acid (VITAMIN C) 100 MG tablet Take 100 mg by mouth daily.   benzonatate (TESSALON PERLES) 100 MG capsule Take 1-2 capsules (100-200 mg total) by mouth 3 (three) times daily as needed for cough.   cholecalciferol (VITAMIN D3) 25 MCG (1000 UNIT) tablet Take 1,000 Units by mouth daily.   clotrimazole-betamethasone (LOTRISONE) cream Apply 1 Application topically 2 (two) times daily.   diphenhydrAMINE-nystatin-hydrocortisone Swish and spit 5 mls 4 times daily as needed for mouth pain as directed.   fexofenadine (ALLEGRA) 60 MG tablet Take 60 mg by mouth 2 (two) times daily.   Glucosamine-Chondroitin (GLUCOSAMINE  CHONDR COMPLEX PO) Take 2 capsules by mouth daily.   hydrochlorothiazide (HYDRODIURIL) 25 MG tablet TAKE 1 TABLET(25 MG) BY MOUTH DAILY   lisinopril (ZESTRIL) 2.5 MG tablet TAKE 1 TABLET(2.5 MG) BY MOUTH DAILY   LIVALO 4 MG TABS Take 1 tablet (4 mg total) by mouth daily.   Multiple Vitamin (MULTIVITAMIN WITH MINERALS) TABS tablet Take 1 tablet by mouth daily.   naftifine (NAFTIN) 1 % cream Apply topically 2 (two) times daily.   omeprazole (PRILOSEC) 40 MG capsule Take 1 capsule (40 mg total) by mouth daily as needed.   pramipexole (MIRAPEX) 0.75 MG tablet    Saccharomyces boulardii (PROBIOTIC) 250 MG CAPS Take by mouth.   Sennosides (SENOKOT PO) Take by mouth. TAKE 4-5 PILLS PER DAY   timolol (TIMOPTIC) 0.5 % ophthalmic solution Place 1 drop into the left eye 2 (two) times daily.   tirzepatide [facility](MOUNJARO) 2.5 MG/0.5ML Pen Inject 2.5 mg into the skin once a week.   zinc gluconate 50 MG tablet Take 50 mg by mouth daily.   No facility-administered encounter medications on file as of 09/11/2022.    Allergies (verified) Metformin and related and Rosuvastatin   History: Past Medical History:  Diagnosis Date   Allergy    SEASONAL   Arthritis    Back pain    Benign fundic gland polyps of stomach    Cataract    EARL STAGES   Constipation    Diabetes mellitus without complication    Esophagitis  LA Class B   Fatigue    Fatty liver    Fibromyalgia    Gallbladder problem    GERD (gastroesophageal reflux disease)    Heart murmur    History of arthroscopy of right knee    Hyperlipidemia    Hypertension    IBS (irritable bowel syndrome)    Joint pain    Neuromuscular disorder    Prediabetes    Past Surgical History:  Procedure Laterality Date   CHOLECYSTECTOMY  2009   low GB EF, no gallstones   COLONOSCOPY     knee arthroscopy right  04/22/2022   LAPAROSCOPY     of adhesions/ of ovaries   UPPER GASTROINTESTINAL ENDOSCOPY     VAGINAL HYSTERECTOMY     Family History   Problem Relation Age of Onset   Heart disease Mother    Hypertension Mother    Hyperlipidemia Mother    Obesity Mother    Colon cancer Father    Hypertension Father    Hyperlipidemia Father    Obesity Father    Colon polyps Sister    Diabetes Sister    Diabetes Brother        Half   Heart disease Brother    Hypertension Brother    Sickle cell anemia Maternal Grandmother    Breast cancer Paternal Grandmother    Sickle cell trait Son    Esophageal cancer Neg Hx    Stomach cancer Neg Hx    Rectal cancer Neg Hx    Crohn's disease Neg Hx    Ulcerative colitis Neg Hx    Social History   Socioeconomic History   Marital status: Single    Spouse name: Not on file   Number of children: Not on file   Years of education: Not on file   Highest education level: Not on file  Occupational History   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  Tobacco Use   Smoking status: Former    Packs/day: 0.10    Years: 10.00    Additional pack years: 0.00    Total pack years: 1.00    Types: Cigarettes    Quit date: 07/14/1981    Years since quitting: 41.1    Passive exposure: Never   Smokeless tobacco: Never  Vaping Use   Vaping Use: Never used  Substance and Sexual Activity   Alcohol use: No   Drug use: No   Sexual activity: Not Currently  Other Topics Concern   Not on file  Social History Narrative   Widowed- husband passed last year   2 grown sons, grandchildren   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, Ship broker- retired   No pets   Enjoys writing, reading, being a grandmother, walking   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   Completed college   Social Determinants of Health   Financial Resource Strain: Low Risk  (09/11/2022)   Overall Financial Resource Strain (CARDIA)    Difficulty of Paying Living Expenses: Not hard at all  Food Insecurity: No Food Insecurity (09/11/2022)   Hunger Vital Sign    Worried About Running Out of Food in the Last  Year: Never true    Ran Out of Food in the Last Year: Never true  Transportation Needs: No Transportation Needs (09/11/2022)   PRAPARE - Administrator, Civil Service (Medical): No    Lack of Transportation (Non-Medical): No  Physical Activity: Insufficiently Active (09/11/2022)   Exercise Vital Sign  Days of Exercise per Week: 3 days    Minutes of Exercise per Session: 20 min  Stress: No Stress Concern Present (09/11/2022)   [employer] of Occupational Health - Occupational Stress Questionnaire    Feeling of Stress : Not at all  Social Connections: Moderately Isolated (09/11/2022)   Social Connection and Isolation Panel [NHANES]    Frequency of Communication with Friends and Family: More than three times a week    Frequency of Social Gatherings with Friends and Family: Once a week    Attends Religious Services: More than 4 times per year    Active Member of [employer] or Organizations: No    Attends Banker Meetings: Never    Marital Status: Widowed    Tobacco Counseling Counseling given: Not Answered   Clinical Intake:  Pre-visit preparation completed: Yes  Pain : No/denies pain  BMI - recorded: 36.04 Nutritional Status: BMI > 30  Obese Nutritional Risks: None Diabetes: Yes CBG done?: No Did pt. bring in CBG monitor from home?: No  How often do you need to have someone help you when you read instructions, pamphlets, or other written materials from your doctor or pharmacy?: 1 - Never  Activities of Daily Living    09/11/2022   11:05 AM  In your present state of health, do you have any difficulty performing the following activities:  Hearing? 0  Vision? 1  Comment dry eyes, beginning stages of glaucoma  Difficulty concentrating or making decisions? 0  Walking or climbing stairs? 1  Comment slight difficulty due to past knee surgery  Dressing or bathing? 0  Doing errands, shopping? 0  Preparing Food and eating ? N  Using the Toilet? N  In  the past six months, have you accidently leaked urine? N  Do you have problems with loss of bowel control? N  Managing your Medications? N  Managing your Finances? N  Housekeeping or managing your Housekeeping? N    Patient Care Team: Sandford Craze, NP as PCP - General (Internal Medicine)  Indicate any recent Medical Services you may have received from other than [facility] providers in the past year (date may be approximate).     Assessment:   This is a routine wellness examination for [name].  Hearing/Vision screen No results found.  Dietary issues and exercise activities discussed: Current Exercise Habits: Home exercise routine, Type of exercise: walking, Time (Minutes): 20, Frequency (Times/Week): 3, Weekly Exercise (Minutes/Week): 60, Intensity: Moderate, Exercise limited by: None identified   Goals Addressed   None    Depression Screen    09/11/2022   11:04 AM 08/27/2022    8:11 AM 07/18/2022   10:30 AM 10/05/2021    9:32 AM 04/17/2021    8:57 AM 03/09/2020    2:12 PM 02/18/2020    7:46 AM  PHQ 2/9 Scores  PHQ - 2 Score 0 0 0 0 0 0 0  PHQ- 9 Score  0 2  4      Fall Risk    09/11/2022   11:01 AM 08/27/2022    8:11 AM 07/18/2022   10:06 AM 04/29/2022    9:39 AM 04/17/2021    8:30 AM  Fall Risk   Falls in the past year? 0 0 0 0 0  Number falls in past yr: 0 0 0 0 0  Injury with Fall? 0 0 0 0 0  Risk for fall due to : No Fall Risks No Fall Risks     Follow up Falls  evaluation completed Falls evaluation completed Falls evaluation completed      FALL RISK PREVENTION PERTAINING TO THE HOME:  Any stairs in or around the home? Yes  If so, are there any without handrails? No  Home free of loose throw rugs in walkways, pet beds, electrical cords, etc? Yes  Adequate lighting in your home to reduce risk of falls? Yes   ASSISTIVE DEVICES UTILIZED TO PREVENT FALLS:  Life alert? No  Use of a cane, walker or w/c? No  Grab bars in the bathroom? No  Shower chair or bench  in shower? No  Elevated toilet seat or a handicapped toilet? No   TIMED UP AND GO:  Was the test performed?  No, audio visit .   Cognitive Function:        09/11/2022   11:09 AM  6CIT Screen  What Year? 0 points  What month? 0 points  What time? 0 points  Count back from 20 0 points  Months in reverse 0 points  Repeat phrase 4 points  Total Score 4 points    Immunizations Immunization History  Administered Date(s) Administered   Fluad Quad(high Dose 65+) 02/18/2020, 04/17/2021   Influenza Split 05/06/2012, 03/03/2013, 03/03/2022   Influenza Whole 03/23/2009   Influenza,inj,Quad PF,6+ Mos 04/01/2019   Influenza,inj,quad, With Preservative 04/02/2017   Moderna Covid-19 Vaccine Bivalent Booster 76yrs & up 05/15/2021, 02/21/2022   Moderna Sars-Covid-2 Vaccination 07/16/2019, 08/13/2019, 03/30/2020   PNEUMOCOCCAL CONJUGATE-20 05/15/2021   Pneumococcal Polysaccharide-23 10/13/2018   Tdap 11/08/2011   Zoster Recombinat (Shingrix) 05/25/2019, 03/20/2020   Zoster, Live 10/16/2015    TDAP status: Due, Education has been provided regarding the importance of this vaccine. Advised may receive this vaccine at local pharmacy or Health Dept. Aware to provide a copy of the vaccination record if obtained from local pharmacy or Health Dept. Verbalized acceptance and understanding.  Flu Vaccine status: Up to date  Pneumococcal vaccine status: Up to date  Covid-19 vaccine status: Information provided on how to obtain vaccines.   Qualifies for Shingles Vaccine? Yes   Zostavax completed Yes   Shingrix Completed?: Yes  Screening Tests Health Maintenance  Topic Date Due   Medicare Annual Wellness (AWV)  Never done   DTaP/Tdap/Td (2 - Td or Tdap) 11/07/2021   COVID-19 Vaccine (6 - 2023-24 season) 01/16/2023 (Originally 04/18/2022)   INFLUENZA VACCINE  01/02/2023   HEMOGLOBIN A1C  02/27/2023   Diabetic kidney evaluation - Urine ACR  04/30/2023   OPHTHALMOLOGY EXAM  05/07/2023    Diabetic kidney evaluation - eGFR measurement  08/27/2023   FOOT EXAM  08/27/2023   MAMMOGRAM  04/29/2024   COLONOSCOPY (Pts 45-47yrs Insurance coverage will need to be confirmed)  03/29/2027   Pneumonia Vaccine 15+ Years old  Completed   DEXA SCAN  Completed   Hepatitis C Screening  Completed   Zoster Vaccines- Shingrix  Completed   HPV VACCINES  Aged Out    Health Maintenance  Health Maintenance Due  Topic Date Due   Medicare Annual Wellness (AWV)  Never done   DTaP/Tdap/Td (2 - Td or Tdap) 11/07/2021    Colorectal cancer screening: Type of screening: Colonoscopy. Completed 03/28/22. Repeat every 5 years  Mammogram status: Completed 04/29/22. Repeat every year  Bone Density status: Completed 09/10/22. Results reflect: Bone density results: NORMAL. Repeat every 2 years.  Lung Cancer Screening: (Low Dose CT Chest recommended if Age 39-80 years, 30 pack-year currently smoking OR have quit w/in 15years.) does not qualify.   Additional Screening:  Hepatitis C Screening: does qualify; Completed 10/13/18  Vision Screening: Recommended annual ophthalmology exams for early detection of glaucoma and other disorders of the eye. Is the patient up to date with their annual eye exam?  Yes  Who is the provider or what is the name of the office in which the patient attends annual eye exams? Dr. Irene Limbo [facility] If pt is not established with a provider, would they like to be referred to a provider to establish care? No .   Dental Screening: Recommended annual dental exams for proper oral hygiene  Community Resource Referral / Chronic Care Management: CRR required this visit?  No   CCM required this visit?  No      Plan:     I have personally reviewed and noted the following in the patient's chart:   Medical and social history Use of alcohol, tobacco or illicit drugs  Current medications and supplements including opioid prescriptions. Patient is not currently taking opioid  prescriptions. Functional ability and status Nutritional status Physical activity Advanced directives List of other physicians Hospitalizations, surgeries, and ER visits in previous 12 months Vitals Screenings to include cognitive, depression, and falls Referrals and appointments  In addition, I have reviewed and discussed with patient certain preventive protocols, quality metrics, and best practice recommendations. A written personalized care plan for preventive services as well as general preventive health recommendations were provided to patient.   Due to this being a telephonic visit, the after visit summary with patients personalized plan was offered to patient via mail or my-chart. Patient would like to access on my-chart.  Donne Anon, [place]   09/11/2022   Nurse Notes: None

## 2022-09-16 DIAGNOSIS — M62830 Muscle spasm of back: Secondary | ICD-10-CM | POA: Diagnosis not present

## 2022-09-16 DIAGNOSIS — M5417 Radiculopathy, lumbosacral region: Secondary | ICD-10-CM | POA: Diagnosis not present

## 2022-09-16 DIAGNOSIS — M9903 Segmental and somatic dysfunction of lumbar region: Secondary | ICD-10-CM | POA: Diagnosis not present

## 2022-09-16 DIAGNOSIS — M9902 Segmental and somatic dysfunction of thoracic region: Secondary | ICD-10-CM | POA: Diagnosis not present

## 2022-09-16 DIAGNOSIS — M5413 Radiculopathy, cervicothoracic region: Secondary | ICD-10-CM | POA: Diagnosis not present

## 2022-09-17 DIAGNOSIS — H04123 Dry eye syndrome of bilateral lacrimal glands: Secondary | ICD-10-CM | POA: Diagnosis not present

## 2022-09-17 DIAGNOSIS — H401131 Primary open-angle glaucoma, bilateral, mild stage: Secondary | ICD-10-CM | POA: Diagnosis not present

## 2022-09-30 DIAGNOSIS — M5417 Radiculopathy, lumbosacral region: Secondary | ICD-10-CM | POA: Diagnosis not present

## 2022-09-30 DIAGNOSIS — M9902 Segmental and somatic dysfunction of thoracic region: Secondary | ICD-10-CM | POA: Diagnosis not present

## 2022-09-30 DIAGNOSIS — M5413 Radiculopathy, cervicothoracic region: Secondary | ICD-10-CM | POA: Diagnosis not present

## 2022-09-30 DIAGNOSIS — M9903 Segmental and somatic dysfunction of lumbar region: Secondary | ICD-10-CM | POA: Diagnosis not present

## 2022-09-30 DIAGNOSIS — M62830 Muscle spasm of back: Secondary | ICD-10-CM | POA: Diagnosis not present

## 2022-10-05 ENCOUNTER — Other Ambulatory Visit: Payer: Self-pay | Admitting: Family

## 2022-10-05 DIAGNOSIS — R809 Proteinuria, unspecified: Secondary | ICD-10-CM

## 2022-10-06 MED ORDER — MOUNJARO 2.5 MG/0.5ML ~~LOC~~ SOAJ
2.5000 mg | SUBCUTANEOUS | 2 refills | Status: DC
  Filled 2022-10-06 – 2022-10-08 (×2): qty 2, 28d supply, fill #0

## 2022-10-07 ENCOUNTER — Other Ambulatory Visit (HOSPITAL_BASED_OUTPATIENT_CLINIC_OR_DEPARTMENT_OTHER): Payer: Self-pay

## 2022-10-07 ENCOUNTER — Telehealth: Payer: Self-pay

## 2022-10-07 NOTE — Telephone Encounter (Signed)
PA initiated via Covermymeds; KEY: [id number]. Awaiting determination

## 2022-10-08 ENCOUNTER — Other Ambulatory Visit (HOSPITAL_BASED_OUTPATIENT_CLINIC_OR_DEPARTMENT_OTHER): Payer: Self-pay

## 2022-10-08 ENCOUNTER — Telehealth: Payer: Self-pay | Admitting: Family

## 2022-10-08 MED ORDER — TIRZEPATIDE 5 MG/0.5ML ~~LOC~~ SOAJ: 5.0000 mg | SUBCUTANEOUS | 1 refills | Status: DC

## 2022-10-08 NOTE — Addendum Note (Signed)
Addended by: Sandford Craze on: 10/08/2022 03:25 PM   Modules accepted: Orders

## 2022-10-08 NOTE — Telephone Encounter (Signed)
Looks like patients ins wants her to go up to next dose 5mg .    Patient notified of status.

## 2022-10-08 NOTE — Telephone Encounter (Signed)
PA Canceled. Please note this request does not require prior authorization review at this time. Member has a current authorization on file effective (09/02/2022 until 09/02/2023). Verified paid claim on DOS 09/03/2022 (2.5mg ); Test claim paid 10/07/2022 (5mg ). Please also note, the program limit for the 2.5mg  strength is 2 mL (4 pens) per 180 days. Please contact pharmacy to process prescription the next dose in the titration. If the member is unable to titrate to the the next dose, the provider may submit a quantity limit exception request. Thank you

## 2022-10-08 NOTE — Telephone Encounter (Signed)
Pt called stating that her mounjaro had been denied and the pharmacy would not fill it for her. After reviewing chart and speaking to [facility], advised pt that it looked like a duplicate PA may have been filid and that [place] would take a look into this matter.

## 2022-10-14 ENCOUNTER — Telehealth: Payer: Self-pay | Admitting: Family

## 2022-10-14 ENCOUNTER — Other Ambulatory Visit: Payer: Self-pay | Admitting: Family

## 2022-10-14 DIAGNOSIS — M5417 Radiculopathy, lumbosacral region: Secondary | ICD-10-CM | POA: Diagnosis not present

## 2022-10-14 DIAGNOSIS — M9903 Segmental and somatic dysfunction of lumbar region: Secondary | ICD-10-CM | POA: Diagnosis not present

## 2022-10-14 DIAGNOSIS — M9902 Segmental and somatic dysfunction of thoracic region: Secondary | ICD-10-CM | POA: Diagnosis not present

## 2022-10-14 DIAGNOSIS — M25561 Pain in right knee: Secondary | ICD-10-CM | POA: Diagnosis not present

## 2022-10-14 DIAGNOSIS — M62830 Muscle spasm of back: Secondary | ICD-10-CM | POA: Diagnosis not present

## 2022-10-14 DIAGNOSIS — M5413 Radiculopathy, cervicothoracic region: Secondary | ICD-10-CM | POA: Diagnosis not present

## 2022-10-14 NOTE — Telephone Encounter (Signed)
Prescription Request  10/14/2022  Is this a "Controlled Substance" medicine? No  LOV: 08/27/2022  What is the name of the medication or equipment?   sitaGLIPtin (JANUVIA) 100 MG tablet [[id number]]  DISCONTINUED   Have you contacted your pharmacy to request a refill? No   Which pharmacy would you like this sent to?  [id number] - [place], [place] - [address] [place] [address] Phone: [phone] Fax: [phone]    Patient notified that their request is being sent to the clinical staff for review and that they should receive a response within 2 business days.   Please advise at Mobile [phone] (mobile)

## 2022-10-14 NOTE — Telephone Encounter (Signed)
Pt called stating that due to nationwide shortage, she is unable to get her Mounjaro filled. He has called her insurance and they gave her the following meds that would be covered under her current plan:  -Trulicity -Ozempic -Actos

## 2022-10-15 ENCOUNTER — Telehealth: Payer: Self-pay

## 2022-10-15 ENCOUNTER — Other Ambulatory Visit (HOSPITAL_BASED_OUTPATIENT_CLINIC_OR_DEPARTMENT_OTHER): Payer: Self-pay

## 2022-10-15 MED ORDER — OZEMPIC (1 MG/DOSE) 4 MG/3ML ~~LOC~~ SOPN
1.0000 mg | PEN_INJECTOR | SUBCUTANEOUS | 2 refills | Status: DC
  Filled 2022-10-15: qty 3, 28d supply, fill #0
  Filled 2022-11-02: qty 3, 28d supply, fill #1

## 2022-10-15 NOTE — Telephone Encounter (Signed)
Spoke with pt.  Advised her to discontinue [place]. Will send rx for ozempic to replace mounjaro which is on back order. Pt is aware.

## 2022-10-15 NOTE — Telephone Encounter (Signed)
PA initiated via Covermymeds; KEY: [id number]. Awaiting determination.

## 2022-10-15 NOTE — Addendum Note (Signed)
Addended by: Sandford Craze on: 10/15/2022 08:57 AM   Modules accepted: Orders

## 2022-10-16 ENCOUNTER — Other Ambulatory Visit (HOSPITAL_BASED_OUTPATIENT_CLINIC_OR_DEPARTMENT_OTHER): Payer: Self-pay

## 2022-10-17 ENCOUNTER — Other Ambulatory Visit (HOSPITAL_BASED_OUTPATIENT_CLINIC_OR_DEPARTMENT_OTHER): Payer: Self-pay

## 2022-10-18 ENCOUNTER — Other Ambulatory Visit: Payer: Self-pay | Admitting: Family

## 2022-10-18 ENCOUNTER — Other Ambulatory Visit (HOSPITAL_BASED_OUTPATIENT_CLINIC_OR_DEPARTMENT_OTHER): Payer: Self-pay

## 2022-10-18 MED ORDER — SITAGLIPTIN PHOSPHATE 100 MG PO TABS
100.0000 mg | ORAL_TABLET | Freq: Every day | ORAL | 1 refills | Status: DC
Start: 2022-10-18 — End: 2022-11-04

## 2022-10-22 NOTE — Telephone Encounter (Signed)
PA approved.   Approved. . Authorization Expiration Date: Oct 15, 2023.

## 2022-11-02 ENCOUNTER — Other Ambulatory Visit: Payer: Self-pay

## 2022-11-04 ENCOUNTER — Other Ambulatory Visit (HOSPITAL_BASED_OUTPATIENT_CLINIC_OR_DEPARTMENT_OTHER): Payer: Self-pay

## 2022-11-04 ENCOUNTER — Ambulatory Visit: Payer: Medicare Other | Admitting: Pharmacist

## 2022-11-04 DIAGNOSIS — M5413 Radiculopathy, cervicothoracic region: Secondary | ICD-10-CM | POA: Diagnosis not present

## 2022-11-04 DIAGNOSIS — M9902 Segmental and somatic dysfunction of thoracic region: Secondary | ICD-10-CM | POA: Diagnosis not present

## 2022-11-04 DIAGNOSIS — M5417 Radiculopathy, lumbosacral region: Secondary | ICD-10-CM | POA: Diagnosis not present

## 2022-11-04 DIAGNOSIS — M62830 Muscle spasm of back: Secondary | ICD-10-CM | POA: Diagnosis not present

## 2022-11-04 DIAGNOSIS — E1129 Type 2 diabetes mellitus with other diabetic kidney complication: Secondary | ICD-10-CM

## 2022-11-04 DIAGNOSIS — M9903 Segmental and somatic dysfunction of lumbar region: Secondary | ICD-10-CM | POA: Diagnosis not present

## 2022-11-04 MED ORDER — SEMAGLUTIDE (2 MG/DOSE) 8 MG/3ML ~~LOC~~ SOPN
2.0000 mg | PEN_INJECTOR | SUBCUTANEOUS | 2 refills | Status: DC
  Filled 2022-11-04: qty 3, 28d supply, fill #0
  Filled 2022-11-28: qty 3, 28d supply, fill #1

## 2022-11-04 NOTE — Progress Notes (Signed)
Pharmacy Quality Measure Review  This patient is appearing on the insurance-provided list for being at risk of failing the adherence measure for diabetes medications this calendar year.   Medication: Januvia  Last fill date: 09/02/2022 - 30 day supply.   Patient was instructed to stop Januvia when Ozempic 1mg  weekly was started in May 2024.  Patient states she is tolerating Ozempic well - denies nausea. She has taken [facility] in past but had trouble getting at pharmacy so was changed. To Ozempic.   She feels that Greggory Keen is a better job controlling her appetite but she is concerned about ongoing supply issues.   Starting weight = 235lbs (08/26/2022)  Starting A1c 7.0%  She does not check blood glucose at home and prefers not to check unless needed.   BP Readings from Last 3 Encounters:  08/27/22 128/75  07/18/22 122/76  04/29/22 115/69   Lab Results  Component Value Date   HGBA1C 7.0 (H) 08/27/2022     Assessment / Plan: Type 2 DM - would like to see A1c < 6.5 Obesity - goal is to decreased weight be 10% or about 25lbs Increase Ozempic to 2mg  weekly (has 1 more dose of 1mg ) - prescription sent to pharmacy Continue to follow low calorie diet  Increase physical activity to goal of 150 minutes per week.

## 2022-11-06 ENCOUNTER — Encounter: Payer: Self-pay | Admitting: Family

## 2022-11-06 MED ORDER — BETAMETHASONE VALERATE 0.1 % EX OINT: 1.0000 | TOPICAL_OINTMENT | Freq: Two times a day (BID) | CUTANEOUS | 0 refills | Status: AC

## 2022-11-18 DIAGNOSIS — M9903 Segmental and somatic dysfunction of lumbar region: Secondary | ICD-10-CM | POA: Diagnosis not present

## 2022-11-18 DIAGNOSIS — M5413 Radiculopathy, cervicothoracic region: Secondary | ICD-10-CM | POA: Diagnosis not present

## 2022-11-18 DIAGNOSIS — M5417 Radiculopathy, lumbosacral region: Secondary | ICD-10-CM | POA: Diagnosis not present

## 2022-11-18 DIAGNOSIS — M62830 Muscle spasm of back: Secondary | ICD-10-CM | POA: Diagnosis not present

## 2022-11-18 DIAGNOSIS — M9902 Segmental and somatic dysfunction of thoracic region: Secondary | ICD-10-CM | POA: Diagnosis not present

## 2022-12-02 DIAGNOSIS — M9903 Segmental and somatic dysfunction of lumbar region: Secondary | ICD-10-CM | POA: Diagnosis not present

## 2022-12-02 DIAGNOSIS — M9902 Segmental and somatic dysfunction of thoracic region: Secondary | ICD-10-CM | POA: Diagnosis not present

## 2022-12-02 DIAGNOSIS — M5417 Radiculopathy, lumbosacral region: Secondary | ICD-10-CM | POA: Diagnosis not present

## 2022-12-02 DIAGNOSIS — M62830 Muscle spasm of back: Secondary | ICD-10-CM | POA: Diagnosis not present

## 2022-12-02 DIAGNOSIS — M5413 Radiculopathy, cervicothoracic region: Secondary | ICD-10-CM | POA: Diagnosis not present

## 2022-12-03 ENCOUNTER — Other Ambulatory Visit (HOSPITAL_BASED_OUTPATIENT_CLINIC_OR_DEPARTMENT_OTHER): Payer: Self-pay

## 2022-12-06 DIAGNOSIS — M1711 Unilateral primary osteoarthritis, right knee: Secondary | ICD-10-CM | POA: Diagnosis not present

## 2022-12-12 ENCOUNTER — Other Ambulatory Visit: Payer: Self-pay | Admitting: Family

## 2022-12-13 DIAGNOSIS — M25561 Pain in right knee: Secondary | ICD-10-CM | POA: Diagnosis not present

## 2022-12-16 DIAGNOSIS — M62830 Muscle spasm of back: Secondary | ICD-10-CM | POA: Diagnosis not present

## 2022-12-16 DIAGNOSIS — M9903 Segmental and somatic dysfunction of lumbar region: Secondary | ICD-10-CM | POA: Diagnosis not present

## 2022-12-16 DIAGNOSIS — M9902 Segmental and somatic dysfunction of thoracic region: Secondary | ICD-10-CM | POA: Diagnosis not present

## 2022-12-16 DIAGNOSIS — M5413 Radiculopathy, cervicothoracic region: Secondary | ICD-10-CM | POA: Diagnosis not present

## 2022-12-16 DIAGNOSIS — M5417 Radiculopathy, lumbosacral region: Secondary | ICD-10-CM | POA: Diagnosis not present

## 2022-12-23 DIAGNOSIS — M5459 Other low back pain: Secondary | ICD-10-CM | POA: Diagnosis not present

## 2022-12-23 DIAGNOSIS — M25561 Pain in right knee: Secondary | ICD-10-CM | POA: Diagnosis not present

## 2022-12-25 ENCOUNTER — Ambulatory Visit (HOSPITAL_BASED_OUTPATIENT_CLINIC_OR_DEPARTMENT_OTHER)
Admission: RE | Admit: 2022-12-25 | Discharge: 2022-12-25 | Disposition: A | Discharge status: Home / Self Care | Payer: Medicare Other | Source: Ambulatory Visit | Attending: Family | Admitting: Family

## 2022-12-25 ENCOUNTER — Ambulatory Visit (INDEPENDENT_AMBULATORY_CARE_PROVIDER_SITE_OTHER): Payer: Medicare Other | Admitting: Family

## 2022-12-25 ENCOUNTER — Other Ambulatory Visit (HOSPITAL_BASED_OUTPATIENT_CLINIC_OR_DEPARTMENT_OTHER): Payer: Self-pay

## 2022-12-25 VITALS — BP 116/74 | HR 75 | Resp 18 | Ht 68.0 in | Wt 227.0 lb

## 2022-12-25 DIAGNOSIS — R809 Proteinuria, unspecified: Secondary | ICD-10-CM

## 2022-12-25 DIAGNOSIS — Z01818 Encounter for other preprocedural examination: Secondary | ICD-10-CM | POA: Diagnosis not present

## 2022-12-25 DIAGNOSIS — E1129 Type 2 diabetes mellitus with other diabetic kidney complication: Secondary | ICD-10-CM

## 2022-12-25 DIAGNOSIS — Z7985 Long-term (current) use of injectable non-insulin antidiabetic drugs: Secondary | ICD-10-CM

## 2022-12-25 DIAGNOSIS — M179 Osteoarthritis of knee, unspecified: Secondary | ICD-10-CM | POA: Diagnosis not present

## 2022-12-25 DIAGNOSIS — I10 Essential (primary) hypertension: Secondary | ICD-10-CM

## 2022-12-25 LAB — COMPREHENSIVE METABOLIC PANEL
ALT: 26 U/L (ref 0–35)
AST: 11 U/L (ref 0–37)
Albumin: 4.2 g/dL (ref 3.5–5.2)
Alkaline Phosphatase: 58 U/L (ref 39–117)
BUN: 13 mg/dL (ref 6–23)
CO2: 32 mEq/L (ref 19–32)
Calcium: 10.1 mg/dL (ref 8.4–10.5)
Chloride: 98 mEq/L (ref 96–112)
Creatinine, Ser: 0.8 mg/dL (ref 0.40–1.20)
GFR: 75.89 mL/min (ref >60.00)
Glucose, Bld: 107 mg/dL — ABNORMAL HIGH (ref 70–99)
Potassium: 4.1 mEq/L (ref 3.5–5.1)
Sodium: 140 mEq/L (ref 135–145)
Total Bilirubin: 0.6 mg/dL (ref 0.2–1.2)
Total Protein: 7.2 g/dL (ref 6.0–8.3)

## 2022-12-25 LAB — HEMOGLOBIN A1C: Hgb A1c MFr Bld: 6.3 % (ref 4.6–6.5)

## 2022-12-25 LAB — CBC WITH DIFFERENTIAL/PLATELET
Basophils Absolute: 0 10*3/uL (ref 0.0–0.1)
Basophils Relative: 0.6 % (ref 0.0–3.0)
Eosinophils Absolute: 0.1 10*3/uL (ref 0.0–0.7)
Eosinophils Relative: 2.1 % (ref 0.0–5.0)
HCT: 40.7 % (ref 36.0–46.0)
Hemoglobin: 14 g/dL (ref 12.0–15.0)
Lymphocytes Relative: 55 % — ABNORMAL HIGH (ref 12.0–46.0)
Lymphs Abs: 2.1 10*3/uL (ref 0.7–4.0)
MCHC: 34.5 g/dL (ref 30.0–36.0)
MCV: 86.9 fl (ref 78.0–100.0)
Monocytes Absolute: 0.4 10*3/uL (ref 0.1–1.0)
Monocytes Relative: 10.2 % (ref 3.0–12.0)
Neutro Abs: 1.3 10*3/uL — ABNORMAL LOW (ref 1.4–7.7)
Neutrophils Relative %: 32.1 % — ABNORMAL LOW (ref 43.0–77.0)
Platelets: 217 10*3/uL (ref 150.0–400.0)
RBC: 4.69 Mil/uL (ref 3.87–5.11)
RDW: 15.4 % (ref 11.5–15.5)
WBC: 3.9 10*3/uL — ABNORMAL LOW (ref 4.0–10.5)

## 2022-12-25 LAB — PROTIME-INR
INR: 1 ratio (ref 0.8–1.0)
Prothrombin Time: 10.9 s (ref 9.6–13.1)

## 2022-12-25 LAB — APTT: aPTT: 30.6 s (ref 25.4–36.8)

## 2022-12-25 MED ORDER — TIRZEPATIDE 5 MG/0.5ML ~~LOC~~ SOAJ
5.0000 mg | SUBCUTANEOUS | 2 refills | Status: DC
Start: 2022-12-25 — End: 2022-12-25

## 2022-12-25 MED ORDER — TIRZEPATIDE 5 MG/0.5ML ~~LOC~~ SOAJ
5.0000 mg | SUBCUTANEOUS | 2 refills | Status: DC
Start: 2022-12-25 — End: 2023-04-18
  Filled 2022-12-25: qty 2, 28d supply, fill #0
  Filled 2023-01-23: qty 2, 28d supply, fill #1
  Filled 2023-02-20: qty 2, 28d supply, fill #2

## 2022-12-25 NOTE — Assessment & Plan Note (Addendum)
Notes that she felt a better hunger suppression on Mounjaro than Ozempic.  We switched her due to availability.  Due to improved availability, will change her back to [facility]. Update A1C.  Last A1C was 7. Her weight has come down 10 pounds since the last time we checked.  Wt Readings from Last 3 Encounters:  12/25/22 227 lb (103 kg)  09/11/22 237 lb (107.5 kg)  08/27/22 235 lb (106.6 kg)

## 2022-12-25 NOTE — Assessment & Plan Note (Signed)
BP stable. Continues hydrochlorothiazide and lisinopril.

## 2022-12-25 NOTE — Progress Notes (Signed)
Subjective:     Patient ID: [name], female    DOB: [birth year], 67 y.o.   MRN: [id number]  No chief complaint on file.   HPI  Discussed the use of AI scribe software for clinical note transcription with the patient, who gave verbal consent to proceed.  History of Present Illness         Wt Readings from Last 3 Encounters:  12/25/22 227 lb (103 kg)  09/11/22 237 lb (107.5 kg)  08/27/22 235 lb (106.6 kg)        Health Maintenance Due  Topic Date Due   DTaP/Tdap/Td (2 - Td or Tdap) 11/07/2021    Past Medical History:  Diagnosis Date   Allergy    SEASONAL   Arthritis    Back pain    Benign fundic gland polyps of stomach    Cataract    EARL STAGES   Constipation    Diabetes mellitus without complication (HCC)    Esophagitis    LA Class B   Fatigue    Fatty liver    Fibromyalgia    Gallbladder problem    GERD (gastroesophageal reflux disease)    Heart murmur    History of arthroscopy of right knee    Hyperlipidemia    Hypertension    IBS (irritable bowel syndrome)    Joint pain    Neuromuscular disorder (HCC)    Prediabetes     Past Surgical History:  Procedure Laterality Date   CHOLECYSTECTOMY  2009   low GB EF, no gallstones   COLONOSCOPY     knee arthroscopy right  04/22/2022   LAPAROSCOPY     of adhesions/ of ovaries   UPPER GASTROINTESTINAL ENDOSCOPY     VAGINAL HYSTERECTOMY      Family History  Problem Relation Age of Onset   Heart disease Mother    Hypertension Mother    Hyperlipidemia Mother    Obesity Mother    Colon cancer Father    Hypertension Father    Hyperlipidemia Father    Obesity Father    Colon polyps Sister    Diabetes Sister    Diabetes Brother        Half   Heart disease Brother    Hypertension Brother    Sickle cell anemia Maternal Grandmother    Breast cancer Paternal Grandmother    Sickle cell trait Son    Esophageal cancer Neg Hx    Stomach cancer Neg Hx    Rectal cancer Neg Hx    Crohn's disease  Neg Hx    Ulcerative colitis Neg Hx     Social History   Socioeconomic History   Marital status: Single    Spouse name: Not on file   Number of children: Not on file   Years of education: Not on file   Highest education level: Bachelor's degree (e.g., BA, AB, BS)  Occupational History   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  Tobacco Use   Smoking status: Former    Current packs/day: 0.00    Average packs/day: 0.1 packs/day for 10.0 years (1.0 ttl pk-yrs)    Types: Cigarettes    Start date: 07/15/1971    Quit date: 07/14/1981    Years since quitting: 41.4    Passive exposure: Never   Smokeless tobacco: Never  Vaping Use   Vaping status: Never Used  Substance and Sexual Activity   Alcohol use: No   Drug use: No   Sexual activity: Not  Currently  Other Topics Concern   Not on file  Social History Narrative   Widowed- husband passed last year   2 grown sons, grandchildren   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, Ship broker- retired   No pets   Enjoys writing, reading, being a grandmother, walking   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   Completed college   Social Determinants of Health   Financial Resource Strain: Medium Risk (12/24/2022)   Overall Financial Resource Strain (CARDIA)    Difficulty of Paying Living Expenses: Somewhat hard  Food Insecurity: No Food Insecurity (12/24/2022)   Hunger Vital Sign    Worried About Running Out of Food in the Last Year: Never true    Ran Out of Food in the Last Year: Never true  Transportation Needs: No Transportation Needs (12/24/2022)   PRAPARE - Administrator, Civil Service (Medical): No    Lack of Transportation (Non-Medical): No  Physical Activity: Insufficiently Active (09/11/2022)   Exercise Vital Sign    Days of Exercise per Week: 3 days    Minutes of Exercise per Session: 20 min  Stress: No Stress Concern Present (09/11/2022)   [employer] of Occupational Health -  Occupational Stress Questionnaire    Feeling of Stress : Not at all  Social Connections: Moderately Isolated (12/24/2022)   Social Connection and Isolation Panel [NHANES]    Frequency of Communication with Friends and Family: More than three times a week    Frequency of Social Gatherings with Friends and Family: More than three times a week    Attends Religious Services: More than 4 times per year    Active Member of [employer] or Organizations: No    Attends Banker Meetings: Never    Marital Status: Widowed  Intimate Partner Violence: Not At Risk (09/11/2022)   Humiliation, Afraid, Rape, and Kick questionnaire    Fear of Current or Ex-Partner: No    Emotionally Abused: No    Physically Abused: No    Sexually Abused: No    Outpatient Medications Prior to Visit  Medication Sig Dispense Refill   Ascorbic Acid (VITAMIN C) 100 MG tablet Take 100 mg by mouth daily.     benzonatate (TESSALON PERLES) 100 MG capsule Take 1-2 capsules (100-200 mg total) by mouth 3 (three) times daily as needed for cough. 40 capsule 0   betamethasone valerate ointment (VALISONE) 0.1 % Apply 1 Application topically 2 (two) times daily. 30 g 0   cholecalciferol (VITAMIN D3) 25 MCG (1000 UNIT) tablet Take 1,000 Units by mouth daily.     clotrimazole-betamethasone (LOTRISONE) cream Apply 1 Application topically 2 (two) times daily. 30 g 1   fexofenadine (ALLEGRA) 60 MG tablet Take 60 mg by mouth 2 (two) times daily.     Glucosamine-Chondroitin (GLUCOSAMINE CHONDR COMPLEX PO) Take 2 capsules by mouth daily.     hydrochlorothiazide (HYDRODIURIL) 25 MG tablet TAKE 1 TABLET(25 MG) BY MOUTH DAILY 90 tablet 1   lisinopril (ZESTRIL) 2.5 MG tablet TAKE 1 TABLET(2.5 MG) BY MOUTH DAILY 90 tablet 1   LIVALO 4 MG TABS Take 1 tablet (4 mg total) by mouth daily. 90 tablet 1   Multiple Vitamin (MULTIVITAMIN WITH MINERALS) TABS tablet Take 1 tablet by mouth daily.     naftifine (NAFTIN) 1 % cream Apply topically 2 (two)  times daily. 60 g 1   omeprazole (PRILOSEC) 40 MG capsule Take 1 capsule (40 mg total) by mouth daily as needed. 30  capsule 0   pramipexole (MIRAPEX) 0.75 MG tablet      Saccharomyces boulardii (PROBIOTIC) 250 MG CAPS Take by mouth.     Sennosides (SENOKOT PO) Take by mouth. TAKE 4-5 PILLS PER DAY     timolol (TIMOPTIC) 0.5 % ophthalmic solution Place 1 drop into the left eye 2 (two) times daily.     zinc gluconate 50 MG tablet Take 50 mg by mouth daily.     Semaglutide, 2 MG/DOSE, 8 MG/3ML SOPN Inject 2 mg into the skin once a week. 3 mL 2   No facility-administered medications prior to visit.    Allergies  Allergen Reactions   Metformin And Related     abd pain   Rosuvastatin Other (See Comments)    Muscle aches    Review of Systems  Constitutional:  Negative for fever.  HENT:  Positive for ear pain (left). Negative for congestion and hearing loss.   Eyes:  Positive for blurred vision (mild).  Respiratory:  Negative for cough and shortness of breath.   Cardiovascular:  Negative for chest pain.  Gastrointestinal:  Positive for constipation.  Genitourinary:  Negative for dysuria, frequency and urgency.  Musculoskeletal:  Positive for joint pain.  Skin:  Negative for rash.  Neurological:  Negative for headaches.  Psychiatric/Behavioral:         Denies depression/anxiety       Objective:    Physical Exam Constitutional:      General: She is not in acute distress.    Appearance: Normal appearance. She is well-developed.  HENT:     Head: Normocephalic and atraumatic.     Right Ear: External ear normal.     Left Ear: External ear normal.  Eyes:     General: No scleral icterus. Neck:     Thyroid: No thyromegaly.  Cardiovascular:     Rate and Rhythm: Normal rate and regular rhythm.     Heart sounds: Normal heart sounds. No murmur heard. Pulmonary:     Effort: Pulmonary effort is normal. No respiratory distress.     Breath sounds: Normal breath sounds. No wheezing.   Musculoskeletal:     Cervical back: Neck supple.  Skin:    General: Skin is warm and dry.  Neurological:     Mental Status: She is alert and oriented to person, place, and time.  Psychiatric:        Mood and Affect: Mood normal.        Behavior: Behavior normal.        Thought Content: Thought content normal.        Judgment: Judgment normal.      BP 116/74   Pulse 75   Resp 18   Ht 5\' 8"  (1.727 m)   Wt 227 lb (103 kg)   SpO2 97%   BMI 34.52 kg/m  Wt Readings from Last 3 Encounters:  12/25/22 227 lb (103 kg)  09/11/22 237 lb (107.5 kg)  08/27/22 235 lb (106.6 kg)       Assessment & Plan:   Problem List Items Addressed This Visit       Unprioritized   HYPERTENSION, BENIGN ESSENTIAL    BP stable. Continues hydrochlorothiazide and lisinopril.       DJD (degenerative joint disease) of knee    Scheduled for right knee replacement under spinal anesthesia. -Preoperative evaluation today including labs, EKG, and chest x-ray.  EKG tracing is personally reviewed.  EKG notes NSR.  No acute changes. Unchanged when compared to EKG  2023.       Controlled type 2 diabetes mellitus with microalbuminuria, without long-term current use of insulin (HCC) - Primary    Notes that she felt a better hunger suppression on Mounjaro than Ozempic.  We switched her due to availability.  Due to improved availability, will change her back to [facility]. Update A1C.  Last A1C was 7. Her weight has come down 10 pounds since the last time we checked.  Wt Readings from Last 3 Encounters:  12/25/22 227 lb (103 kg)  09/11/22 237 lb (107.5 kg)  08/27/22 235 lb (106.6 kg)         Relevant Medications   tirzepatide (MOUNJARO) 5 MG/0.5ML Pen   Other Relevant Orders   HgB A1c   Comp Met (CMET)   Other Visit Diagnoses     Preoperative evaluation to rule out surgical contraindication       Relevant Orders   CBC with Differential/Platelet   Urine Culture   Protime-INR   PTT   DG Chest 2  View   EKG 12-Lead (Completed)       I have discontinued Arnaldo Natal. Varney's Semaglutide (2 MG/DOSE). I am also having her maintain her fexofenadine, Probiotic, cholecalciferol, vitamin C, zinc gluconate, multivitamin with minerals, timolol, Glucosamine-Chondroitin (GLUCOSAMINE CHONDR COMPLEX PO), pramipexole, Sennosides (SENOKOT PO), lisinopril, naftifine, Livalo, clotrimazole-betamethasone, benzonatate, hydrochlorothiazide, betamethasone valerate ointment, omeprazole, and tirzepatide.  Meds ordered this encounter  Medications   DISCONTD: tirzepatide (MOUNJARO) 5 MG/0.5ML Pen    Sig: Inject 5 mg into the skin once a week.    Dispense:  6 mL    Refill:  2    Order Specific Question:   Supervising Provider    Answer:   Danise Edge A [[id number]]   tirzepatide (MOUNJARO) 5 MG/0.5ML Pen    Sig: Inject 5 mg into the skin once a week.    Dispense:  6 mL    Refill:  2    Order Specific Question:   Supervising Provider    Answer:   Danise Edge A [id number]

## 2022-12-25 NOTE — Assessment & Plan Note (Signed)
Scheduled for right knee replacement under spinal anesthesia. -Preoperative evaluation today including labs, EKG, and chest x-ray.  EKG tracing is personally reviewed.  EKG notes NSR.  No acute changes. Unchanged when compared to EKG 2023.

## 2022-12-26 LAB — URINE CULTURE: SPECIMEN QUALITY:: ADEQUATE

## 2022-12-30 ENCOUNTER — Other Ambulatory Visit (HOSPITAL_BASED_OUTPATIENT_CLINIC_OR_DEPARTMENT_OTHER): Payer: Self-pay

## 2022-12-30 DIAGNOSIS — M9903 Segmental and somatic dysfunction of lumbar region: Secondary | ICD-10-CM | POA: Diagnosis not present

## 2022-12-30 DIAGNOSIS — M62830 Muscle spasm of back: Secondary | ICD-10-CM | POA: Diagnosis not present

## 2022-12-30 DIAGNOSIS — M5413 Radiculopathy, cervicothoracic region: Secondary | ICD-10-CM | POA: Diagnosis not present

## 2022-12-30 DIAGNOSIS — M5417 Radiculopathy, lumbosacral region: Secondary | ICD-10-CM | POA: Diagnosis not present

## 2022-12-30 DIAGNOSIS — M9902 Segmental and somatic dysfunction of thoracic region: Secondary | ICD-10-CM | POA: Diagnosis not present

## 2023-01-19 ENCOUNTER — Other Ambulatory Visit: Payer: Self-pay | Admitting: Family

## 2023-01-20 DIAGNOSIS — H401131 Primary open-angle glaucoma, bilateral, mild stage: Secondary | ICD-10-CM | POA: Diagnosis not present

## 2023-01-20 DIAGNOSIS — H04123 Dry eye syndrome of bilateral lacrimal glands: Secondary | ICD-10-CM | POA: Diagnosis not present

## 2023-01-27 DIAGNOSIS — M9902 Segmental and somatic dysfunction of thoracic region: Secondary | ICD-10-CM | POA: Diagnosis not present

## 2023-01-27 DIAGNOSIS — M5413 Radiculopathy, cervicothoracic region: Secondary | ICD-10-CM | POA: Diagnosis not present

## 2023-01-27 DIAGNOSIS — M5417 Radiculopathy, lumbosacral region: Secondary | ICD-10-CM | POA: Diagnosis not present

## 2023-01-27 DIAGNOSIS — M62830 Muscle spasm of back: Secondary | ICD-10-CM | POA: Diagnosis not present

## 2023-01-27 DIAGNOSIS — M9903 Segmental and somatic dysfunction of lumbar region: Secondary | ICD-10-CM | POA: Diagnosis not present

## 2023-02-05 ENCOUNTER — Ambulatory Visit: Payer: Self-pay | Admitting: Orthopedic Surgery

## 2023-02-24 ENCOUNTER — Other Ambulatory Visit (HOSPITAL_BASED_OUTPATIENT_CLINIC_OR_DEPARTMENT_OTHER): Payer: Self-pay

## 2023-02-24 DIAGNOSIS — M9902 Segmental and somatic dysfunction of thoracic region: Secondary | ICD-10-CM | POA: Diagnosis not present

## 2023-02-24 DIAGNOSIS — M5417 Radiculopathy, lumbosacral region: Secondary | ICD-10-CM | POA: Diagnosis not present

## 2023-02-24 DIAGNOSIS — M62830 Muscle spasm of back: Secondary | ICD-10-CM | POA: Diagnosis not present

## 2023-02-24 DIAGNOSIS — M5413 Radiculopathy, cervicothoracic region: Secondary | ICD-10-CM | POA: Diagnosis not present

## 2023-02-24 DIAGNOSIS — M9903 Segmental and somatic dysfunction of lumbar region: Secondary | ICD-10-CM | POA: Diagnosis not present

## 2023-03-11 NOTE — Progress Notes (Signed)
COVID Vaccine received:  []  No [x]  Yes Date of any COVID positive Test in last 90 days:  PCP - Sandford Craze, NP  Cardiologist -   Chest x-ray - 12-31-2022 2v  Epic EKG - 12-25-2022  Epic  Stress Test -  ECHO -  Cardiac Cath -   PCR screen: [x]  Ordered & Completed []   No Order but Needs PROFEND     []   N/A for this surgery  Surgery Plan:  []  Ambulatory   [x]  Outpatient in bed  []  Admit Anesthesia:    []  General  [x]  Spinal  []   Choice []   MAC  Pacemaker / ICD device [x]  No []  Yes   Spinal Cord Stimulator:[x]  No []  Yes       History of Sleep Apnea? []  No []  Yes   had sleep study in 2015 CPAP used?- []  No []  Yes    Does the patient monitor blood sugar?   []  N/A   []  No []  Yes  Patient has: []  NO Hx DM   [x]  Pre-DM   []  DM1  []   DM2 Last A1c was:  6.3  on  12-25-2022    Does patient have a [employer] or Dexacom? []  No []  Yes   Fasting Blood Sugar Ranges-  Checks Blood Sugar _____ times a day  GLP1 agonist / usual dose - Mounjaro Injections on ? GLP1 instructions: hold x 1 week  Blood Thinner / Instructions:  none Aspirin Instructions:  none  ERAS Protocol Ordered: []  No  [x]  Yes PRE-SURGERY []  ENSURE  [x]  G2     Patient is to be NPO after: 0900  Dental hx: []  Dentures:  []  N/A      []  Bridge or Partial:                   []  Loose or Damaged teeth:   Comments: Patient was given the 5 CHG shower / bath instructions for TKA surgery along with 2 bottles of the CHG soap. Patient will start this on: Monday 03-17-2023   All questions were asked and answered, Patient voiced understanding of this process.   Activity level: Patient is able / unable to climb a flight of stairs without difficulty; []  No CP  []  No SOB, but would have ___   Patient can / can not perform ADLs without assistance.   Anesthesia review: Pre-DM, HTN, NASH, GERD, Fibromyalgia, Heart Murmur, Coronary Calcium score was 0 on 08-22-2021, anxiety,   Patient denies shortness of breath, fever, cough and  chest pain at PAT appointment.  Patient verbalized understanding and agreement to the Pre-Surgical Instructions that were given to them at this PAT appointment. Patient was also educated of the need to review these PAT instructions again prior to her surgery.I reviewed the appropriate phone numbers to call if they have any and questions or concerns.

## 2023-03-11 NOTE — Patient Instructions (Signed)
SURGICAL WAITING ROOM VISITATION Patients having surgery or a procedure may have no more than 2 support people in the waiting area - these visitors may rotate in the visitor waiting room.   Due to an increase in RSV and influenza rates and associated hospitalizations, children ages 60 and under may not visit patients in [facility] hospitals. If the patient needs to stay at the hospital during part of their recovery, the visitor guidelines for inpatient rooms apply.  PRE-OP VISITATION  Pre-op nurse will coordinate an appropriate time for 1 support person to accompany the patient in pre-op.  This support person may not rotate.  This visitor will be contacted when the time is appropriate for the visitor to come back in the pre-op area.  Please refer to the [facility] website for the visitor guidelines for Inpatients (after your surgery is over and you are in a regular room).  You are not required to quarantine at this time prior to your surgery. However, you must do this: Hand Hygiene often Do NOT share personal items Notify your provider if you are in close contact with someone who has COVID or you develop fever 100.4 or greater, new onset of sneezing, cough, sore throat, shortness of breath or body aches.  If you test positive for Covid or have been in contact with anyone that has tested positive in the last 10 days please notify you surgeon.    Your procedure is scheduled on:  FRIDAY   March 21, 2023  Report to [facility] Main Entrance: [facility]sen entrance where the [employer] is available.   Report to admitting at: 09:30    AM  Call this number if you have any questions or problems the morning of surgery [phone]  Do not eat food after Midnight the night prior to your surgery/procedure.  After Midnight you may have the following liquids until   09:00 AM  DAY OF SURGERY  Clear Liquid Diet Water Black Coffee (sugar ok, NO MILK/CREAM OR CREAMERS)  Tea (sugar ok, NO  MILK/CREAM OR CREAMERS) regular and decaf                             Plain Jell-O  with no fruit (NO RED)                                           Fruit ices (not with fruit pulp, NO RED)                                     Popsicles (NO RED)                                                                  Juice: NO CITRUS JUICES: only apple, WHITE grape, WHITE cranberry Sports drinks like Gatorade or Powerade (NO RED)                    The day of surgery:  Drink ONE (1) Pre-Surgery G2 at  09:00 AM the morning  of surgery. Drink in one sitting. Do not sip.  This drink was given to you during your hospital pre-op appointment visit. Nothing else to drink after completing the Pre-Surgery G2 : No candy, chewing gum or throat lozenges.    FOLLOW  ANY ADDITIONAL PRE OP INSTRUCTIONS YOU RECEIVED FROM YOUR SURGEON'S OFFICE!!!   Oral Hygiene is also important to reduce your risk of infection.        Remember - BRUSH YOUR TEETH THE MORNING OF SURGERY WITH YOUR REGULAR TOOTHPASTE  Do NOT smoke after Midnight the night before surgery.  STOP TAKING all Vitamins, Herbs and supplements 1 week before your surgery.   Take ONLY these medicines the morning of surgery with A SIP OF WATER: Omeprazole.  You may use your Eye drops if needed.    If You have been diagnosed with Sleep Apnea - Bring CPAP mask and tubing day of surgery. We will provide you with a CPAP machine on the day of your surgery.                   You may not have any metal on your body including hair pins, jewelry, and body piercing  Do not wear make-up, lotions, powders, perfumes or deodorant  Do not wear nail polish including gel and S&S, artificial / acrylic nails, or any other type of covering on natural nails including finger and toenails. If you have artificial nails, gel coating, etc., that needs to be removed by a nail salon, Please have this removed prior to surgery. Not doing so may mean that your surgery could be  cancelled or delayed if the Surgeon or anesthesia staff feels like they are unable to monitor you safely.   Do not shave 48 hours prior to surgery to avoid nicks in your skin which may contribute to postoperative infections.   Contacts, Hearing Aids, dentures or bridgework may not be worn into surgery. DENTURES WILL BE REMOVED PRIOR TO SURGERY PLEASE DO NOT APPLY "Poly grip" OR ADHESIVES!!!  You may bring a small overnight bag with you on the day of surgery, only pack items that are not valuable. [facility] IS NOT RESPONSIBLE   FOR VALUABLES THAT ARE LOST OR STOLEN.    Do not bring your home medications to the hospital. The Pharmacy will dispense medications listed on your medication list to you during your admission in the Hospital.  Please read over the following fact sheets you were given: IF YOU HAVE QUESTIONS ABOUT YOUR PRE-OP INSTRUCTIONS, PLEASE CALL [phone].     Pre-operative 5 CHG Bath Instructions   You can play a key role in reducing the risk of infection after surgery. Your skin needs to be as free of germs as possible. You can reduce the number of germs on your skin by washing with CHG (chlorhexidine gluconate) soap before surgery. CHG is an antiseptic soap that kills germs and continues to kill germs even after washing.   DO NOT use if you have an allergy to chlorhexidine/CHG or antibacterial soaps. If your skin becomes reddened or irritated, stop using the CHG and notify one of our RNs at [phone]  Please shower with the CHG soap starting 4 days before surgery using the following schedule: START SHOWERS ON   MONDAY  March 21, 2023  Please keep in mind the following:  DO NOT shave, including legs and underarms, starting the day of your first shower.   You may shave your face at any  point before/day of surgery.   Place clean sheets on your bed the day you start using CHG soap. Use a clean washcloth (not used since being washed) for each shower. DO NOT sleep with pets once you start using the CHG.   CHG Shower Instructions:  If you choose to wash your hair and private area, wash first with your normal shampoo/soap.  After you use shampoo/soap, rinse your hair and body thoroughly to remove shampoo/soap residue.  Turn the water OFF and apply about 3 tablespoons (45 ml) of CHG soap to a CLEAN washcloth.  Apply CHG soap ONLY FROM YOUR NECK DOWN TO YOUR TOES (washing for 3-5 minutes)  DO NOT use CHG soap on face, private areas, open wounds, or sores.  Pay special attention to the area where your surgery is being performed.  If you are having back surgery, having someone wash your back for you may be helpful.  Wait 2 minutes after CHG soap is applied, then you may rinse off the CHG soap.  Pat dry with a clean towel  Put on clean clothes/pajamas   If you choose to wear lotion, please use ONLY the CHG-compatible lotions on the back of this paper.     Additional instructions for the day of surgery: DO NOT APPLY any lotions, deodorants, cologne, or perfumes.   Put on clean/comfortable clothes.  Brush your teeth.  Ask your nurse before applying any prescription medications to the skin.      CHG Compatible Lotions   Aveeno Moisturizing lotion  Cetaphil Moisturizing Cream  Cetaphil Moisturizing Lotion  Clairol Herbal Essence Moisturizing Lotion, Dry Skin  Clairol Herbal Essence Moisturizing Lotion, Extra Dry Skin  Clairol Herbal Essence Moisturizing Lotion, Normal Skin  Curel Age Defying Therapeutic Moisturizing Lotion with Alpha Hydroxy  Curel Extreme Care Body Lotion  Curel Soothing Hands Moisturizing Hand Lotion  Curel Therapeutic Moisturizing Cream, Fragrance-Free  Curel Therapeutic Moisturizing Lotion, Fragrance-Free  Curel Therapeutic Moisturizing Lotion,  Original Formula  Eucerin Daily Replenishing Lotion  Eucerin Dry Skin Therapy Plus Alpha Hydroxy Crme  Eucerin Dry Skin Therapy Plus Alpha Hydroxy Lotion  Eucerin Original Crme  Eucerin Original Lotion  Eucerin Plus Crme Eucerin Plus Lotion  Eucerin TriLipid Replenishing Lotion  Keri Anti-Bacterial Hand Lotion  Keri Deep Conditioning Original Lotion Dry Skin Formula Softly Scented  Keri Deep Conditioning Original Lotion, Fragrance Free Sensitive Skin Formula  Keri Lotion Fast Absorbing Fragrance Free Sensitive Skin Formula  Keri Lotion Fast Absorbing Softly Scented Dry Skin Formula  Keri Original Lotion  Keri Skin Renewal Lotion Keri Silky Smooth Lotion  Keri Silky Smooth Sensitive Skin Lotion  Nivea Body Creamy Conditioning Oil  Nivea Body Extra Enriched Lotion  Nivea Body Original Lotion  Nivea Body Sheer Moisturizing Lotion Nivea Crme  Nivea Skin Firming Lotion  NutraDerm 30 Skin Lotion  NutraDerm Skin Lotion  NutraDerm Therapeutic Skin Cream  NutraDerm Therapeutic Skin Lotion  ProShield Protective Hand Cream  Provon moisturizing lotion   FAILURE TO FOLLOW THESE INSTRUCTIONS MAY RESULT IN THE CANCELLATION OF YOUR SURGERY  PATIENT SIGNATURE_________________________________  NURSE SIGNATURE__________________________________  ________________________________________________________________________       Rogelia Mire    An incentive spirometer is a tool that can help keep your lungs clear and active. This tool measures how well you are filling your lungs with each breath. Taking  long deep breaths may help reverse or decrease the chance of developing breathing (pulmonary) problems (especially infection) following: A long period of time when you are unable to move or be active. BEFORE THE PROCEDURE  If the spirometer includes an indicator to show your best effort, your nurse or respiratory therapist will set it to a desired goal. If possible, sit up straight  or lean slightly forward. Try not to slouch. Hold the incentive spirometer in an upright position. INSTRUCTIONS FOR USE  Sit on the edge of your bed if possible, or sit up as far as you can in bed or on a chair. Hold the incentive spirometer in an upright position. Breathe out normally. Place the mouthpiece in your mouth and seal your lips tightly around it. Breathe in slowly and as deeply as possible, raising the piston or the ball toward the top of the column. Hold your breath for 3-5 seconds or for as long as possible. Allow the piston or ball to fall to the bottom of the column. Remove the mouthpiece from your mouth and breathe out normally. Rest for a few seconds and repeat Steps 1 through 7 at least 10 times every 1-2 hours when you are awake. Take your time and take a few normal breaths between deep breaths. The spirometer may include an indicator to show your best effort. Use the indicator as a goal to work toward during each repetition. After each set of 10 deep breaths, practice coughing to be sure your lungs are clear. If you have an incision (the cut made at the time of surgery), support your incision when coughing by placing a pillow or rolled up towels firmly against it. Once you are able to get out of bed, walk around indoors and cough well. You may stop using the incentive spirometer when instructed by your caregiver.  RISKS AND COMPLICATIONS Take your time so you do not get dizzy or light-headed. If you are in pain, you may need to take or ask for pain medication before doing incentive spirometry. It is harder to take a deep breath if you are having pain. AFTER USE Rest and breathe slowly and easily. It can be helpful to keep track of a log of your progress. Your caregiver can provide you with a simple table to help with this. If you are using the spirometer at home, follow these instructions: SEEK MEDICAL CARE IF:  You are having difficultly using the spirometer. You have  trouble using the spirometer as often as instructed. Your pain medication is not giving enough relief while using the spirometer. You develop fever of 100.5 F (38.1 C) or higher.                                                                                                    SEEK IMMEDIATE MEDICAL CARE IF:  You cough up bloody sputum that had not been present before. You develop fever of 102 F (38.9 C) or greater. You develop worsening pain at or near the incision site. MAKE SURE YOU:  Understand these instructions. Will watch your condition.  Will get help right away if you are not doing well or get worse. Document Released: 09/30/2006 Document Revised: 08/12/2011 Document Reviewed: 12/01/2006 [facility] Patient Information 2014 [place], [place].

## 2023-03-12 ENCOUNTER — Emergency Department (HOSPITAL_BASED_OUTPATIENT_CLINIC_OR_DEPARTMENT_OTHER)
Admission: EM | Admit: 2023-03-12 | Discharge: 2023-03-12 | Disposition: A | Discharge status: Home / Self Care | Payer: Medicare Other | Attending: Emergency Medicine | Admitting: Emergency Medicine

## 2023-03-12 ENCOUNTER — Emergency Department (HOSPITAL_BASED_OUTPATIENT_CLINIC_OR_DEPARTMENT_OTHER): Payer: Medicare Other

## 2023-03-12 ENCOUNTER — Encounter (HOSPITAL_COMMUNITY)
Admission: RE | Admit: 2023-03-12 | Discharge: 2023-03-12 | Disposition: A | Discharge status: Home / Self Care | Payer: Medicare Other | Source: Ambulatory Visit | Attending: Specialist | Admitting: Specialist

## 2023-03-12 ENCOUNTER — Encounter (HOSPITAL_BASED_OUTPATIENT_CLINIC_OR_DEPARTMENT_OTHER): Payer: Self-pay | Admitting: Pediatrics

## 2023-03-12 ENCOUNTER — Encounter (HOSPITAL_COMMUNITY): Payer: Self-pay

## 2023-03-12 ENCOUNTER — Other Ambulatory Visit: Payer: Self-pay

## 2023-03-12 DIAGNOSIS — Z79899 Other long term (current) drug therapy: Secondary | ICD-10-CM | POA: Insufficient documentation

## 2023-03-12 DIAGNOSIS — Z87891 Personal history of nicotine dependence: Secondary | ICD-10-CM | POA: Insufficient documentation

## 2023-03-12 DIAGNOSIS — M79661 Pain in right lower leg: Secondary | ICD-10-CM | POA: Insufficient documentation

## 2023-03-12 DIAGNOSIS — R011 Cardiac murmur, unspecified: Secondary | ICD-10-CM | POA: Insufficient documentation

## 2023-03-12 DIAGNOSIS — I10 Essential (primary) hypertension: Secondary | ICD-10-CM | POA: Insufficient documentation

## 2023-03-12 DIAGNOSIS — Z01812 Encounter for preprocedural laboratory examination: Secondary | ICD-10-CM | POA: Insufficient documentation

## 2023-03-12 DIAGNOSIS — W19XXXA Unspecified fall, initial encounter: Secondary | ICD-10-CM

## 2023-03-12 DIAGNOSIS — M25552 Pain in left hip: Secondary | ICD-10-CM | POA: Diagnosis not present

## 2023-03-12 DIAGNOSIS — K7581 Nonalcoholic steatohepatitis (NASH): Secondary | ICD-10-CM | POA: Insufficient documentation

## 2023-03-12 DIAGNOSIS — Y92009 Unspecified place in unspecified non-institutional (private) residence as the place of occurrence of the external cause: Secondary | ICD-10-CM | POA: Insufficient documentation

## 2023-03-12 DIAGNOSIS — E119 Type 2 diabetes mellitus without complications: Secondary | ICD-10-CM | POA: Insufficient documentation

## 2023-03-12 DIAGNOSIS — M25562 Pain in left knee: Secondary | ICD-10-CM | POA: Diagnosis not present

## 2023-03-12 DIAGNOSIS — M7652 Patellar tendinitis, left knee: Secondary | ICD-10-CM | POA: Diagnosis not present

## 2023-03-12 DIAGNOSIS — W108XXA Fall (on) (from) other stairs and steps, initial encounter: Secondary | ICD-10-CM | POA: Insufficient documentation

## 2023-03-12 DIAGNOSIS — M25511 Pain in right shoulder: Secondary | ICD-10-CM | POA: Insufficient documentation

## 2023-03-12 DIAGNOSIS — M160 Bilateral primary osteoarthritis of hip: Secondary | ICD-10-CM | POA: Diagnosis not present

## 2023-03-12 DIAGNOSIS — Z01818 Encounter for other preprocedural examination: Secondary | ICD-10-CM

## 2023-03-12 DIAGNOSIS — M7651 Patellar tendinitis, right knee: Secondary | ICD-10-CM | POA: Diagnosis not present

## 2023-03-12 DIAGNOSIS — R519 Headache, unspecified: Secondary | ICD-10-CM | POA: Insufficient documentation

## 2023-03-12 DIAGNOSIS — M25551 Pain in right hip: Secondary | ICD-10-CM | POA: Diagnosis not present

## 2023-03-12 DIAGNOSIS — M1712 Unilateral primary osteoarthritis, left knee: Secondary | ICD-10-CM | POA: Diagnosis not present

## 2023-03-12 DIAGNOSIS — M1711 Unilateral primary osteoarthritis, right knee: Secondary | ICD-10-CM | POA: Diagnosis not present

## 2023-03-12 DIAGNOSIS — M25561 Pain in right knee: Secondary | ICD-10-CM | POA: Diagnosis not present

## 2023-03-12 HISTORY — DX: Pneumonia, unspecified organism: J18.9

## 2023-03-12 HISTORY — DX: Prediabetes: R73.03

## 2023-03-12 HISTORY — DX: Family history of other specified conditions: Z84.89

## 2023-03-12 LAB — COMPREHENSIVE METABOLIC PANEL
ALT: 36 U/L (ref 0–44)
AST: 18 U/L (ref 15–41)
Albumin: 4.2 g/dL (ref 3.5–5.0)
Alkaline Phosphatase: 71 U/L (ref 38–126)
Anion gap: 11 (ref 5–15)
BUN: 16 mg/dL (ref 8–23)
CO2: 27 mmol/L (ref 22–32)
Calcium: 10 mg/dL (ref 8.9–10.3)
Chloride: 98 mmol/L (ref 98–111)
Creatinine, Ser: 0.71 mg/dL (ref 0.44–1.00)
GFR, Estimated: >60 mL/min (ref >60)
Glucose, Bld: 121 mg/dL — ABNORMAL HIGH (ref 70–99)
Potassium: 3.5 mmol/L (ref 3.5–5.1)
Sodium: 136 mmol/L (ref 135–145)
Total Bilirubin: 0.7 mg/dL (ref 0.3–1.2)
Total Protein: 7.6 g/dL (ref 6.5–8.1)

## 2023-03-12 LAB — SURGICAL PCR SCREEN
MRSA, PCR: NEGATIVE
Staphylococcus aureus: NEGATIVE

## 2023-03-12 LAB — CBC
HCT: 40.1 % (ref 36.0–46.0)
Hemoglobin: 14.6 g/dL (ref 12.0–15.0)
MCH: 30.9 pg (ref 26.0–34.0)
MCHC: 36.4 g/dL — ABNORMAL HIGH (ref 30.0–36.0)
MCV: 84.8 fL (ref 80.0–100.0)
Platelets: 218 10*3/uL (ref 150–400)
RBC: 4.73 MIL/uL (ref 3.87–5.11)
RDW: 14.6 % (ref 11.5–15.5)
WBC: 4.5 10*3/uL (ref 4.0–10.5)
nRBC: 0 % (ref 0.0–0.2)

## 2023-03-12 MED ORDER — ACETAMINOPHEN 325 MG PO TABS
650.0000 mg | ORAL_TABLET | Freq: Once | ORAL | Status: AC
  Administered 2023-03-12: 650 mg via ORAL
  Filled 2023-03-12: qty 2

## 2023-03-12 NOTE — Discharge Instructions (Signed)
As discussed, workup today overall reassuring.  CT imaging of your head and neck were negative for any brain bleed, fracture or dislocation.  X-rays of your pelvis, knees, right lower leg, right shoulder were negative for any fracture or dislocation.  Recommend treatment of pain at home with Tylenol/Motrin as well as icing affected areas.  Recommend follow-up with primary care/orthopedics for reassessment of your symptoms.  Please do not hesitate to return to emergency department for worrisome signs and symptoms we discussed become apparent.

## 2023-03-12 NOTE — ED Provider Notes (Signed)
[facility] Provider Note   CSN: [id number] Arrival date & time: 03/12/23  1426     History  Chief Complaint  Patient presents with   [name] is a 67 y.o. female.   Fall   67 year old female presents emergency department after a fall.  Patient states that she was walking down the steps wearing socks when she feels like she slipped and her right knee "gave out" after twisting causing her to fall forward.  Reports hitting the front of her head on a step.  States that she fell down approximately 13 steps.  Currently complaining of headache, right shoulder pain, bilateral knee pain, right lower leg pain.  Patient denies loss of consciousness, anticoagulation use but does states she takes a daily aspirin.  Denies any chest pain, shortness of breath, abdominal pain, nausea, vomiting.  Denies any visual symptoms, he had a mild, slurred speech, facial droop, weakness/sensory deficits in upper extremities.  Past medical history significant for hypertension, hyperlipidemia, neuromuscular disorder, IBS, GERD, diabetes mellitus type 2  Home Medications Prior to Admission medications   Medication Sig Start Date End Date Taking? Authorizing Provider  Ascorbic Acid (VITAMIN C) 100 MG tablet Take 100 mg by mouth daily.    [provider]  betamethasone valerate ointment (VALISONE) 0.1 % Apply 1 Application topically 2 (two) times daily. Patient taking differently: Apply 1 Application topically 2 (two) times daily as needed (irritation). 11/06/22   Sandford Craze, NP  Calcium Carbonate (CALTRATE 600 PO) Take 600 mg by mouth daily.    [provider]  cholecalciferol (VITAMIN D3) 25 MCG (1000 UNIT) tablet Take 1,000 Units by mouth daily.    [provider]  fexofenadine (ALLEGRA) 180 MG tablet Take 180 mg by mouth daily as needed for allergies. 02/14/20   [provider]  hydrochlorothiazide  (HYDRODIURIL) 25 MG tablet TAKE 1 TABLET(25 MG) BY MOUTH DAILY 10/14/22   Sandford Craze, NP  linaclotide (LINZESS) 290 MCG CAPS capsule Take 290 mcg by mouth daily as needed (constipation).    [provider]  Multiple Vitamin (MULTIVITAMIN WITH MINERALS) TABS tablet Take 1 tablet by mouth daily.    [provider]  omeprazole (PRILOSEC) 40 MG capsule TAKE 1 CAPSULE(40 MG) BY MOUTH DAILY AS NEEDED 01/19/23   Sandford Craze, NP  Plant Sterol Stanol-Pantethine (CHOLEST OFF COMPLETE PO) Take 1 capsule by mouth daily.    [provider]  Probiotic Product (PROBIOTIC DAILY PO) Take 1 capsule by mouth daily.    [provider]  senna (SENOKOT) 8.6 MG tablet Take 5 tablets by mouth at bedtime.    [provider]  timolol (TIMOPTIC) 0.5 % ophthalmic solution Place 1 drop into both eyes 2 (two) times daily. 11/29/20   [provider]  tirzepatide Greggory Keen) 5 MG/0.5ML Pen Inject 5 mg into the skin once a week. 12/25/22   Sandford Craze, NP      Allergies    Metformin and related and Rosuvastatin    Review of Systems   Review of Systems  All other systems reviewed and are negative.   Physical Exam Updated Vital Signs BP 121/63   Pulse 66   Temp 97.6 F (36.4 C) (Oral)   Resp 16   Ht 5\' 8"  (1.727 m)   Wt 100.2 kg   SpO2 100%   BMI 33.60 kg/m  Physical Exam Vitals and nursing note reviewed.  Constitutional:  General: She is not in acute distress.    Appearance: She is well-developed.  HENT:     Head: Normocephalic.     Comments: Swelling appreciated right forehead.  No obvious abrasion/laceration. Eyes:     Conjunctiva/sclera: Conjunctivae normal.  Cardiovascular:     Rate and Rhythm: Normal rate and regular rhythm.     Heart sounds: Murmur heard.  Pulmonary:     Effort: Pulmonary effort is normal. No respiratory distress.     Breath sounds: Normal breath sounds. No wheezing, rhonchi or rales.  Abdominal:      Palpations: Abdomen is soft.     Tenderness: There is no abdominal tenderness.  Musculoskeletal:     Cervical back: Neck supple.     Comments: No midline tenderness of cervical, thoracic, lumbar spine without step-off or deformity noted.  No chest wall tenderness.  Mild tenderness of right proximal humerus over the distal clavicle but otherwise, upper extremities without tenderness.  Tender to palpation of the proximal hip as well as bilateral knee.  Swelling appreciated anterior lateral aspect of right knee.  Tenderness of distal medial tibia.  No tenderness of ankle/foot bilaterally.  Pedal and radial pulses 2+ bilaterally.  Skin:    General: Skin is warm and dry.     Capillary Refill: Capillary refill takes less than 2 seconds.  Neurological:     Mental Status: She is alert.     Comments: Alert and oriented to self, place, time and event.   Speech is fluent, clear without dysarthria or dysphasia.   Strength symmetric in upper/lower extremities   Sensation intact in upper/lower extremities   CN I not tested  CN II not tested CN III, IV, VI PERRLA and EOMs intact bilaterally  CN V Intact sensation to sharp and light touch to the face  CN VII facial movements symmetric  CN VIII not tested  CN IX, X no uvula deviation, symmetric rise of soft palate  CN XI symmetric SCM and trapezius strength bilaterally  CN XII Midline tongue protrusion, symmetric L/R movements     Psychiatric:        Mood and Affect: Mood normal.     ED Results / Procedures / Treatments   Labs (all labs ordered are listed, but only abnormal results are displayed) Labs Reviewed - No data to display  EKG None  Radiology CT Head Wo Contrast  Result Date: 03/12/2023 CLINICAL DATA:  Larey Seat down 13 steps EXAM: CT HEAD WITHOUT CONTRAST CT CERVICAL SPINE WITHOUT CONTRAST TECHNIQUE: Multidetector CT imaging of the head and cervical spine was performed following the standard protocol without intravenous contrast.  Multiplanar CT image reconstructions of the cervical spine were also generated. RADIATION DOSE REDUCTION: This exam was performed according to the departmental dose-optimization program which includes automated exposure control, adjustment of the mA and/or kV according to patient size and/or use of iterative reconstruction technique. COMPARISON:  None Available. FINDINGS: CT HEAD FINDINGS Brain: No evidence of acute infarction, hemorrhage, hydrocephalus, extra-axial collection or mass lesion/mass effect. Vascular: No hyperdense vessel or unexpected calcification. Skull: Normal. Negative for fracture or focal lesion. Sinuses/Orbits: No acute finding. Other: None CT CERVICAL SPINE FINDINGS Alignment: Mild reversal of cervical lordosis. No subluxation. Facet alignment within normal limits Skull base and vertebrae: No acute fracture. No primary bone lesion or focal pathologic process. Soft tissues and spinal canal: No prevertebral fluid or swelling. No visible canal hematoma. Disc levels: Multilevel degenerative change. Mild to moderate disc space narrowing C5-C6 and C6-C7. Upper chest:  Negative. Other: None IMPRESSION: 1. Negative non contrasted CT appearance of the brain. 2. Mild reversal of cervical lordosis with degenerative change. No acute osseous abnormality. Electronically Signed   By: Jasmine Pang M.D.   On: 03/12/2023 18:09   CT Cervical Spine Wo Contrast  Result Date: 03/12/2023 CLINICAL DATA:  Larey Seat down 13 steps EXAM: CT HEAD WITHOUT CONTRAST CT CERVICAL SPINE WITHOUT CONTRAST TECHNIQUE: Multidetector CT imaging of the head and cervical spine was performed following the standard protocol without intravenous contrast. Multiplanar CT image reconstructions of the cervical spine were also generated. RADIATION DOSE REDUCTION: This exam was performed according to the departmental dose-optimization program which includes automated exposure control, adjustment of the mA and/or kV according to patient size  and/or use of iterative reconstruction technique. COMPARISON:  None Available. FINDINGS: CT HEAD FINDINGS Brain: No evidence of acute infarction, hemorrhage, hydrocephalus, extra-axial collection or mass lesion/mass effect. Vascular: No hyperdense vessel or unexpected calcification. Skull: Normal. Negative for fracture or focal lesion. Sinuses/Orbits: No acute finding. Other: None CT CERVICAL SPINE FINDINGS Alignment: Mild reversal of cervical lordosis. No subluxation. Facet alignment within normal limits Skull base and vertebrae: No acute fracture. No primary bone lesion or focal pathologic process. Soft tissues and spinal canal: No prevertebral fluid or swelling. No visible canal hematoma. Disc levels: Multilevel degenerative change. Mild to moderate disc space narrowing C5-C6 and C6-C7. Upper chest: Negative. Other: None IMPRESSION: 1. Negative non contrasted CT appearance of the brain. 2. Mild reversal of cervical lordosis with degenerative change. No acute osseous abnormality. Electronically Signed   By: Jasmine Pang M.D.   On: 03/12/2023 18:09   DG Tibia/Fibula Right  Result Date: 03/12/2023 CLINICAL DATA:  Pain after fall down steps today. Right lower leg pain. EXAM: RIGHT TIBIA AND FIBULA - 2 VIEW COMPARISON:  None Available. FINDINGS: No acute fracture. The cortical margins of the tibia and fibula are intact. Ankle alignment is maintained. No focal bone abnormality. Mild soft tissue edema. IMPRESSION: Mild soft tissue edema. No acute fracture. Electronically Signed   By: Narda Rutherford M.D.   On: 03/12/2023 17:06   DG Shoulder Right  Result Date: 03/12/2023 CLINICAL DATA:  Pain after fall down steps today. Right shoulder pain. EXAM: RIGHT SHOULDER - 2+ VIEW COMPARISON:  None Available. FINDINGS: There is no evidence of fracture or dislocation. Mild acromioclavicular degenerative spurring. Soft tissues are unremarkable. No fracture of included right ribs. IMPRESSION: No fracture or dislocation of  the right shoulder. Mild acromioclavicular degenerative spurring. Electronically Signed   By: Narda Rutherford M.D.   On: 03/12/2023 17:05   DG Knee Complete 4 Views Right  Result Date: 03/12/2023 CLINICAL DATA:  Pain after fall down steps today. Bilateral knee pain. EXAM: RIGHT KNEE - COMPLETE 4+ VIEW COMPARISON:  None Available. FINDINGS: No acute fracture or dislocation. No knee joint effusion. Mild degenerative change with tricompartmental spurring. Small patellar tendon enthesophyte. There may be mild anterior soft tissue edema. IMPRESSION: 1. No acute fracture or dislocation of the right knee. 2. Mild tricompartmental osteoarthritis. Electronically Signed   By: Narda Rutherford M.D.   On: 03/12/2023 17:04   DG Knee Complete 4 Views Left  Result Date: 03/12/2023 CLINICAL DATA:  Pain after fall down steps today. Bilateral knee pain. EXAM: LEFT KNEE - COMPLETE 4+ VIEW COMPARISON:  None Available. FINDINGS: No acute fracture or dislocation. No knee joint effusion. Mild osteoarthritis with peripheral spurring. Small quadriceps and patellar tendon enthesophytes. IMPRESSION: No acute fracture or dislocation of the left knee.  Mild osteoarthritis. Electronically Signed   By: Narda Rutherford M.D.   On: 03/12/2023 17:02   DG Hip Unilat W or Wo Pelvis 2-3 Views Left  Result Date: 03/12/2023 CLINICAL DATA:  Pain after fall.  Pelvis/left hip pain. EXAM: DG HIP (WITH OR WITHOUT PELVIS) 2-3V LEFT COMPARISON:  None Available. FINDINGS: No acute fracture of the pelvis or left hip. Femoral head is well seated, no hip dislocation. Moderate bilateral hip osteoarthritis. The pubic symphysis and sacroiliac joints are congruent. IMPRESSION: No acute fracture of the pelvis or left hip. Moderate bilateral hip osteoarthritis. Electronically Signed   By: Narda Rutherford M.D.   On: 03/12/2023 16:59    Procedures Procedures    Medications Ordered in ED Medications  acetaminophen (TYLENOL) tablet 650 mg (650 mg Oral  Given 03/12/23 1610)    ED Course/ Medical Decision Making/ A&P                                 Medical Decision Making Amount and/or Complexity of Data Reviewed Radiology: ordered.  Risk OTC drugs.   This patient presents to the ED for concern of fall, this involves an extensive number of treatment options, and is a complaint that carries with it a high risk of complications and morbidity.  The differential diagnosis includes CVA, fracture, sprain, dislocation, ligament/tendinous injury, neurovascular compromise, spinal cord injury, pneumothorax, solid organ damage, other   Co morbidities that complicate the patient evaluation  See HPI   Additional history obtained:  Additional history obtained from EMR External records from outside source obtained and reviewed including hospital records   Lab Tests:  N/a   Imaging Studies ordered:  I ordered imaging studies including CT head/cervical spine, right shoulder x-ray, right knee x-ray, left knee x-ray, right tibia/fibula x-ray, pelvis with left hip x-ray I independently visualized and interpreted imaging which showed  CT head/cervical spine: No acute intracranial abnormality.  Mild cervical lordosis reversal with degenerative change.  No acute traumatic fracture or traumatic listhesis of cervical spine. Right shoulder x-ray: No fracture or dislocation. Right knee x-ray: No acute osseous abnormality.  OA Left knee x-ray: No acute osseous abnormality.  OA Right tibia/fib x-ray: Soft tissue swelling.  No acute osseous abnormality. Pelvis with left hip x-ray: No acute osseous abnormality.  Mild bilateral lower. I agree with the radiologist interpretation  Cardiac Monitoring: / EKG:  The patient was maintained on a cardiac monitor.  I personally viewed and interpreted the cardiac monitored which showed an underlying rhythm of: Sinus rhythm   Consultations Obtained:  N/a   Problem List / ED Course / Critical interventions  / Medication management  Fall I ordered medication including Tylenol  Reevaluation of the patient after these medicines showed that the patient improved I have reviewed the patients home medicines and have made adjustments as needed   Social Determinants of Health:  Former cigarette use.  Denies illicit drug use.   Test / Admission - Considered:  Fall Vitals signs within normal range and stable throughout visit. Imaging studies significant for: See above 67 year old female presents emergency department with complaints of mechanical fall.  Patient's workup today overall reassuring.  X-rays negative for any acute osseous abnormality.  CT imaging of patient's head as well as cervical spine significant for no acute abnormality.  Patient able to ambulate independently with some pain.  Patient reassured by overall workup.  Will recommend rest, ice, elevation, Tylenol/Motrin for pain and  follow-up with primary care/orthopedics in the outpatient setting.  She will plan discussed length with patient and she acknowledged understanding was agreeable to said plan.  Patient overall well-appearing, afebrile in no acute distress. Worrisome signs and symptoms were discussed with the patient, and the patient acknowledged understanding to return to the ED if noticed. Patient was stable upon discharge.          Final Clinical Impression(s) / ED Diagnoses Final diagnoses:  Fall, initial encounter    Rx / DC Orders ED Discharge Orders     None         Peter Garter, [place] 03/12/23 1816    Maia Plan, MD 03/14/23 [phone]

## 2023-03-12 NOTE — ED Triage Notes (Signed)
Reported fall down 13 steps down the stairs inside her home. C/O head, right shoulder and right lower leg pain. + head injury, unknown LOC, on ASA and no blood thinners. Denies neck and hip pain.

## 2023-03-14 DIAGNOSIS — L299 Pruritus, unspecified: Secondary | ICD-10-CM | POA: Diagnosis not present

## 2023-03-14 DIAGNOSIS — L2089 Other atopic dermatitis: Secondary | ICD-10-CM | POA: Diagnosis not present

## 2023-03-17 ENCOUNTER — Ambulatory Visit: Payer: Self-pay | Admitting: Orthopedic Surgery

## 2023-03-17 NOTE — H&P (Signed)
[name] is an 67 y.o. female.   Chief Complaint: right knee pain HPI: Patient is here for her H&P. She is scheduled for a RTKR by Dr. Shelle Iron at [facility] on 03/21/23.  Dr. Shelle Iron and the patient mutually agreed to proceed with a total knee replacement. Risks and benefits of the procedure were discussed including stiffness, suboptimal range of motion, persistent pain, infection requiring removal of prosthesis and reinsertion, need for prophylactic antibiotics in the future, for example, dental procedures, possible need for manipulation, revision in the future and also anesthetic complications including DVT, PE, etc. We discussed the perioperative course, time in the hospital, postoperative recovery and the need for elevation to control swelling. We also discussed the predicted range of motion and the probability that squatting and kneeling would be unobtainable in the future. In addition, postoperative anticoagulation was discussed. We have obtained preoperative medical clearance as necessary. Provided illustrated handout and discussed it in detail. They will enroll in the total joint replacement educational forum at the hospital.   She is scheduled for her preop on October 9  Past Medical History:  Diagnosis Date   Allergy    SEASONAL   Arthritis    Back pain    Benign fundic gland polyps of stomach    Cataract    EARL STAGES   Constipation    Esophagitis    LA Class B   Family history of adverse reaction to anesthesia    Maternal aunt died on the table at age 53 while having a hysterectomy. only had hx of DM.   Fatigue    Fatty liver    Fibromyalgia    Gallbladder problem    GERD (gastroesophageal reflux disease)    Heart murmur    History of arthroscopy of right knee    Hyperlipidemia    Hypertension    IBS (irritable bowel syndrome)    Joint pain    Neuromuscular disorder (HCC)    Pneumonia    Pre-diabetes     Past Surgical History:  Procedure Laterality  Date   CHOLECYSTECTOMY  2009   low GB EF, no gallstones   COLONOSCOPY     EYE SURGERY Bilateral    cataract surgery w/ IOL   knee arthroscopy right  04/22/2022   LAPAROSCOPY     of adhesions/ of ovaries   UPPER GASTROINTESTINAL ENDOSCOPY     VAGINAL HYSTERECTOMY      Family History  Problem Relation Age of Onset   Heart disease Mother    Hypertension Mother    Hyperlipidemia Mother    Obesity Mother    Colon cancer Father    Hypertension Father    Hyperlipidemia Father    Obesity Father    Colon polyps Sister    Diabetes Sister    Diabetes Brother        Half   Heart disease Brother    Hypertension Brother    Sickle cell anemia Maternal Grandmother    Breast cancer Paternal Grandmother    Sickle cell trait Son    Esophageal cancer Neg Hx    Stomach cancer Neg Hx    Rectal cancer Neg Hx    Crohn's disease Neg Hx    Ulcerative colitis Neg Hx    Social History:  reports that she quit smoking about 41 years ago. Her smoking use included cigarettes. She started smoking about 51 years ago. She has a 1 pack-year smoking history. She has never been exposed to tobacco smoke.  She has never used smokeless tobacco. She reports that she does not drink alcohol and does not use drugs.  Allergies:  Allergies  Allergen Reactions   Metformin And Related     abd pain   Rosuvastatin Other (See Comments)    Muscle aches   Current meds: aspirin benzonatate 100 mg capsule betamethasone valerate 0.1 % topical ointment clotrimazole-betamethasone 1 %-0.05 % topical cream fluocinolone 0.01 % topical body oil hydroCHLOROthiazide 25 mg tablet lidocaine 5 % topical patch Livalo 4 mg tablet meloxicam 15 mg tablet Mounjaro 5 mg/0.5 mL subcutaneous pen injector naftifine 1 % topical cream neomycin 3.5 mg/g-polymyxin B 10,000 unit/g-dexameth 0.1 % eye oint omeprazole 40 mg capsule,delayed release timoloL maleate 0.5 % eye drops traMADoL 50 mg tablet Vitamin D3  Review of Systems   Constitutional: Negative.   HENT: Negative.    Eyes: Negative.   Respiratory: Negative.    Cardiovascular: Negative.   Gastrointestinal: Negative.   Endocrine: Negative.   Genitourinary: Negative.   Musculoskeletal:  Positive for arthralgias, gait problem, joint swelling and myalgias.  Skin: Negative.   Psychiatric/Behavioral: Negative.      There were no vitals taken for this visit. Physical Exam Constitutional:      Appearance: Normal appearance.  HENT:     Head: Normocephalic and atraumatic.     Right Ear: External ear normal.     Left Ear: External ear normal.     Nose: Nose normal.     Mouth/Throat:     Pharynx: Oropharynx is clear.  Eyes:     Conjunctiva/sclera: Conjunctivae normal.  Cardiovascular:     Rate and Rhythm: Normal rate and regular rhythm.     Pulses: Normal pulses.     Heart sounds: Normal heart sounds.  Pulmonary:     Effort: Pulmonary effort is normal.     Breath sounds: Normal breath sounds.  Abdominal:     General: Bowel sounds are normal.  Musculoskeletal:     Cervical back: Normal range of motion.     Comments: Right knee she has a mild effusion no erythema ecchymosis deformity her active ranges 0-1 30. With mild discomfort with range of motion. Ipsilateral hip and ankle exam is unremarkable. Straight leg raise is negative she is exquisitely tender lateral joint line. Equivocal [place]  Skin:    General: Skin is warm and dry.  Neurological:     Mental Status: She is alert.    MRI of the right knee demonstrates recurrent horizontal tearing of the lateral meniscus anterior horn and posterior body with a 1 cm parameniscal cyst abutting the body chronic fraying of the medial meniscus full-thickness chondral loss in the central weightbearing aspect of the lateral tibial plateau moderate tendinosis of the patellar tendon ossification of the prepatellar quadriceps continuation  Assessment/Plan Impression: Right knee end-stage osteoarthritis  Plan:  Pt with end-stage right knee DJD, bone-on-bone, refractory to conservative tx, scheduled for right total knee replacement by Dr. Shelle Iron on October 9. We again discussed the procedure itself as well as risks, complications and alternatives, including but not limited to DVT, PE, infx, bleeding, failure of procedure, need for secondary procedure including manipulation, nerve injury, ongoing pain/symptoms, anesthesia risk, even stroke or death. Also discussed typical post-op protocols, activity restrictions, need for PT, flexion/extension exercises, time out of work. Discussed need for DVT ppx post-op per protocol. Discussed dental ppx and infx prevention. Also discussed limitations post-operatively such as kneeling and squatting. All questions were answered. Patient desires to proceed with surgery as scheduled.  Will hold supplements, ASA and NSAIDs accordingly. Will remain NPO after midnight the night before surgery. Will present to [facility] for pre-op testing. Anticipate hospital stay to include at least 2 midnights given medical history and to ensure proper pain control. Plan aspirin for DVT ppx post-op. Plan oxycodone, Robaxin, Colace, Miralax. Plan home with HHPT post-op with family members at home for assistance, order placed through [facility]. Will follow up 10-14 days post-op for suture removal and xrays.   Plan right total knee replacement  Dorothy Spark, PA-C for Dr Shelle Iron 03/17/2023, 4:16 PM

## 2023-03-17 NOTE — H&P (View-Only) (Signed)
[name] is an 67 y.o. female.   Chief Complaint: right knee pain HPI: Patient is here for her H&P. She is scheduled for a RTKR by Dr. Shelle Iron at [facility] on 03/21/23.  Dr. Shelle Iron and the patient mutually agreed to proceed with a total knee replacement. Risks and benefits of the procedure were discussed including stiffness, suboptimal range of motion, persistent pain, infection requiring removal of prosthesis and reinsertion, need for prophylactic antibiotics in the future, for example, dental procedures, possible need for manipulation, revision in the future and also anesthetic complications including DVT, PE, etc. We discussed the perioperative course, time in the hospital, postoperative recovery and the need for elevation to control swelling. We also discussed the predicted range of motion and the probability that squatting and kneeling would be unobtainable in the future. In addition, postoperative anticoagulation was discussed. We have obtained preoperative medical clearance as necessary. Provided illustrated handout and discussed it in detail. They will enroll in the total joint replacement educational forum at the hospital.   She is scheduled for her preop on October 9  Past Medical History:  Diagnosis Date   Allergy    SEASONAL   Arthritis    Back pain    Benign fundic gland polyps of stomach    Cataract    EARL STAGES   Constipation    Esophagitis    LA Class B   Family history of adverse reaction to anesthesia    Maternal aunt died on the table at age 53 while having a hysterectomy. only had hx of DM.   Fatigue    Fatty liver    Fibromyalgia    Gallbladder problem    GERD (gastroesophageal reflux disease)    Heart murmur    History of arthroscopy of right knee    Hyperlipidemia    Hypertension    IBS (irritable bowel syndrome)    Joint pain    Neuromuscular disorder (HCC)    Pneumonia    Pre-diabetes     Past Surgical History:  Procedure Laterality  Date   CHOLECYSTECTOMY  2009   low GB EF, no gallstones   COLONOSCOPY     EYE SURGERY Bilateral    cataract surgery w/ IOL   knee arthroscopy right  04/22/2022   LAPAROSCOPY     of adhesions/ of ovaries   UPPER GASTROINTESTINAL ENDOSCOPY     VAGINAL HYSTERECTOMY      Family History  Problem Relation Age of Onset   Heart disease Mother    Hypertension Mother    Hyperlipidemia Mother    Obesity Mother    Colon cancer Father    Hypertension Father    Hyperlipidemia Father    Obesity Father    Colon polyps Sister    Diabetes Sister    Diabetes Brother        Half   Heart disease Brother    Hypertension Brother    Sickle cell anemia Maternal Grandmother    Breast cancer Paternal Grandmother    Sickle cell trait Son    Esophageal cancer Neg Hx    Stomach cancer Neg Hx    Rectal cancer Neg Hx    Crohn's disease Neg Hx    Ulcerative colitis Neg Hx    Social History:  reports that she quit smoking about 41 years ago. Her smoking use included cigarettes. She started smoking about 51 years ago. She has a 1 pack-year smoking history. She has never been exposed to tobacco smoke.  She has never used smokeless tobacco. She reports that she does not drink alcohol and does not use drugs.  Allergies:  Allergies  Allergen Reactions   Metformin And Related     abd pain   Rosuvastatin Other (See Comments)    Muscle aches   Current meds: aspirin benzonatate 100 mg capsule betamethasone valerate 0.1 % topical ointment clotrimazole-betamethasone 1 %-0.05 % topical cream fluocinolone 0.01 % topical body oil hydroCHLOROthiazide 25 mg tablet lidocaine 5 % topical patch Livalo 4 mg tablet meloxicam 15 mg tablet Mounjaro 5 mg/0.5 mL subcutaneous pen injector naftifine 1 % topical cream neomycin 3.5 mg/g-polymyxin B 10,000 unit/g-dexameth 0.1 % eye oint omeprazole 40 mg capsule,delayed release timoloL maleate 0.5 % eye drops traMADoL 50 mg tablet Vitamin D3  Review of Systems   Constitutional: Negative.   HENT: Negative.    Eyes: Negative.   Respiratory: Negative.    Cardiovascular: Negative.   Gastrointestinal: Negative.   Endocrine: Negative.   Genitourinary: Negative.   Musculoskeletal:  Positive for arthralgias, gait problem, joint swelling and myalgias.  Skin: Negative.   Psychiatric/Behavioral: Negative.      There were no vitals taken for this visit. Physical Exam Constitutional:      Appearance: Normal appearance.  HENT:     Head: Normocephalic and atraumatic.     Right Ear: External ear normal.     Left Ear: External ear normal.     Nose: Nose normal.     Mouth/Throat:     Pharynx: Oropharynx is clear.  Eyes:     Conjunctiva/sclera: Conjunctivae normal.  Cardiovascular:     Rate and Rhythm: Normal rate and regular rhythm.     Pulses: Normal pulses.     Heart sounds: Normal heart sounds.  Pulmonary:     Effort: Pulmonary effort is normal.     Breath sounds: Normal breath sounds.  Abdominal:     General: Bowel sounds are normal.  Musculoskeletal:     Cervical back: Normal range of motion.     Comments: Right knee she has a mild effusion no erythema ecchymosis deformity her active ranges 0-1 30. With mild discomfort with range of motion. Ipsilateral hip and ankle exam is unremarkable. Straight leg raise is negative she is exquisitely tender lateral joint line. Equivocal [place]  Skin:    General: Skin is warm and dry.  Neurological:     Mental Status: She is alert.    MRI of the right knee demonstrates recurrent horizontal tearing of the lateral meniscus anterior horn and posterior body with a 1 cm parameniscal cyst abutting the body chronic fraying of the medial meniscus full-thickness chondral loss in the central weightbearing aspect of the lateral tibial plateau moderate tendinosis of the patellar tendon ossification of the prepatellar quadriceps continuation  Assessment/Plan Impression: Right knee end-stage osteoarthritis  Plan:  Pt with end-stage right knee DJD, bone-on-bone, refractory to conservative tx, scheduled for right total knee replacement by Dr. Shelle Iron on October 9. We again discussed the procedure itself as well as risks, complications and alternatives, including but not limited to DVT, PE, infx, bleeding, failure of procedure, need for secondary procedure including manipulation, nerve injury, ongoing pain/symptoms, anesthesia risk, even stroke or death. Also discussed typical post-op protocols, activity restrictions, need for PT, flexion/extension exercises, time out of work. Discussed need for DVT ppx post-op per protocol. Discussed dental ppx and infx prevention. Also discussed limitations post-operatively such as kneeling and squatting. All questions were answered. Patient desires to proceed with surgery as scheduled.  Will hold supplements, ASA and NSAIDs accordingly. Will remain NPO after midnight the night before surgery. Will present to [facility] for pre-op testing. Anticipate hospital stay to include at least 2 midnights given medical history and to ensure proper pain control. Plan aspirin for DVT ppx post-op. Plan oxycodone, Robaxin, Colace, Miralax. Plan home with HHPT post-op with family members at home for assistance, order placed through [facility]. Will follow up 10-14 days post-op for suture removal and xrays.   Plan right total knee replacement  Dorothy Spark, PA-C for Dr Shelle Iron 03/17/2023, 4:16 PM

## 2023-03-21 ENCOUNTER — Encounter (HOSPITAL_COMMUNITY)
Admission: RE | Disposition: A | Discharge status: Home / Self Care | Payer: Self-pay | Source: Ambulatory Visit | Attending: Specialist

## 2023-03-21 ENCOUNTER — Observation Stay (HOSPITAL_COMMUNITY)
Admission: RE | Admit: 2023-03-21 | Discharge: 2023-03-25 | Disposition: A | Discharge status: Home / Self Care | Payer: Medicare Other | Source: Ambulatory Visit | Attending: Specialist | Admitting: Specialist

## 2023-03-21 ENCOUNTER — Encounter (HOSPITAL_COMMUNITY): Payer: Self-pay | Admitting: Specialist

## 2023-03-21 ENCOUNTER — Ambulatory Visit (HOSPITAL_COMMUNITY): Payer: Medicare Other | Admitting: Certified Registered Nurse Anesthetist

## 2023-03-21 ENCOUNTER — Other Ambulatory Visit: Payer: Self-pay

## 2023-03-21 ENCOUNTER — Ambulatory Visit (HOSPITAL_COMMUNITY): Payer: Medicare Other

## 2023-03-21 DIAGNOSIS — Z7982 Long term (current) use of aspirin: Secondary | ICD-10-CM | POA: Diagnosis not present

## 2023-03-21 DIAGNOSIS — G8918 Other acute postprocedural pain: Secondary | ICD-10-CM | POA: Diagnosis not present

## 2023-03-21 DIAGNOSIS — M1711 Unilateral primary osteoarthritis, right knee: Secondary | ICD-10-CM | POA: Diagnosis not present

## 2023-03-21 DIAGNOSIS — Z79899 Other long term (current) drug therapy: Secondary | ICD-10-CM | POA: Insufficient documentation

## 2023-03-21 DIAGNOSIS — I10 Essential (primary) hypertension: Secondary | ICD-10-CM | POA: Diagnosis not present

## 2023-03-21 DIAGNOSIS — Z96651 Presence of right artificial knee joint: Secondary | ICD-10-CM | POA: Diagnosis not present

## 2023-03-21 DIAGNOSIS — Z87891 Personal history of nicotine dependence: Secondary | ICD-10-CM | POA: Insufficient documentation

## 2023-03-21 DIAGNOSIS — Z01818 Encounter for other preprocedural examination: Secondary | ICD-10-CM

## 2023-03-21 DIAGNOSIS — K7581 Nonalcoholic steatohepatitis (NASH): Secondary | ICD-10-CM

## 2023-03-21 DIAGNOSIS — R7303 Prediabetes: Secondary | ICD-10-CM

## 2023-03-21 HISTORY — PX: TOTAL KNEE ARTHROPLASTY: SHX125

## 2023-03-21 LAB — GLUCOSE, CAPILLARY
Glucose-Capillary: 102 mg/dL — ABNORMAL HIGH (ref 70–99)
Glucose-Capillary: 93 mg/dL (ref 70–99)
Glucose-Capillary: 135 mg/dL — ABNORMAL HIGH (ref 70–99)
Glucose-Capillary: 126 mg/dL — ABNORMAL HIGH (ref 70–99)

## 2023-03-21 SURGERY — ARTHROPLASTY, KNEE, TOTAL
Anesthesia: Monitor Anesthesia Care | Site: Knee | Laterality: Right

## 2023-03-21 MED ORDER — AMISULPRIDE (ANTIEMETIC) 5 MG/2ML IV SOLN: 10.0000 mg | Freq: Once | INTRAVENOUS | Status: DC | PRN

## 2023-03-21 MED ORDER — POLYETHYLENE GLYCOL 3350 17 G PO PACK
17.0000 g | PACK | Freq: Every day | ORAL | Status: DC
  Administered 2023-03-21 – 2023-03-25 (×5): 17 g via ORAL
  Filled 2023-03-21 (×5): qty 1

## 2023-03-21 MED ORDER — ONDANSETRON HCL 4 MG/2ML IJ SOLN
4.0000 mg | Freq: Four times a day (QID) | INTRAMUSCULAR | Status: DC | PRN
  Administered 2023-03-21 – 2023-03-22 (×2): 4 mg via INTRAVENOUS
  Filled 2023-03-21 (×2): qty 2

## 2023-03-21 MED ORDER — PROPOFOL 1000 MG/100ML IV EMUL
INTRAVENOUS | Status: AC
  Filled 2023-03-21: qty 100

## 2023-03-21 MED ORDER — SODIUM CHLORIDE (PF) 0.9 % IJ SOLN
INTRAMUSCULAR | Status: AC
  Filled 2023-03-21: qty 40

## 2023-03-21 MED ORDER — LORATADINE 10 MG PO TABS
10.0000 mg | ORAL_TABLET | Freq: Every day | ORAL | Status: DC
  Administered 2023-03-22 – 2023-03-25 (×4): 10 mg via ORAL
  Filled 2023-03-21 (×4): qty 1

## 2023-03-21 MED ORDER — PRONTOSAN WOUND IRRIGATION OPTIME
TOPICAL | Status: DC | PRN
Start: 2023-03-21 — End: 2023-03-21
  Administered 2023-03-21: 350 mL via TOPICAL

## 2023-03-21 MED ORDER — CEFAZOLIN SODIUM-DEXTROSE 2-4 GM/100ML-% IV SOLN
2.0000 g | INTRAVENOUS | Status: AC
  Administered 2023-03-21: 2 g via INTRAVENOUS
  Filled 2023-03-21: qty 100

## 2023-03-21 MED ORDER — LACTATED RINGERS IV SOLN: INTRAVENOUS | Status: DC

## 2023-03-21 MED ORDER — VITAMIN C 500 MG PO TABS
500.0000 mg | ORAL_TABLET | Freq: Every day | ORAL | Status: DC
  Administered 2023-03-22 – 2023-03-25 (×4): 500 mg via ORAL
  Filled 2023-03-21 (×4): qty 1

## 2023-03-21 MED ORDER — ROPIVACAINE HCL 5 MG/ML IJ SOLN
INTRAMUSCULAR | Status: DC | PRN
Start: 2023-03-21 — End: 2023-03-21
  Administered 2023-03-21: 30 mL via PERINEURAL

## 2023-03-21 MED ORDER — POLYETHYLENE GLYCOL 3350 17 G PO PACK: 17.0000 g | PACK | Freq: Every day | ORAL | 0 refills | Status: DC

## 2023-03-21 MED ORDER — ONDANSETRON HCL 4 MG/2ML IJ SOLN
INTRAMUSCULAR | Status: AC
  Filled 2023-03-21: qty 2

## 2023-03-21 MED ORDER — ALUM & MAG HYDROXIDE-SIMETH 200-200-20 MG/5ML PO SUSP: 30.0000 mL | ORAL | Status: DC | PRN

## 2023-03-21 MED ORDER — CEFAZOLIN SODIUM-DEXTROSE 2-4 GM/100ML-% IV SOLN
2.0000 g | Freq: Four times a day (QID) | INTRAVENOUS | Status: AC
  Administered 2023-03-21 – 2023-03-22 (×2): 2 g via INTRAVENOUS
  Filled 2023-03-21 (×2): qty 100

## 2023-03-21 MED ORDER — DOCUSATE SODIUM 100 MG PO CAPS
100.0000 mg | ORAL_CAPSULE | Freq: Two times a day (BID) | ORAL | 2 refills | Status: DC
Start: 2023-03-21 — End: 2024-03-20

## 2023-03-21 MED ORDER — TIRZEPATIDE 5 MG/0.5ML ~~LOC~~ SOAJ: 5.0000 mg | SUBCUTANEOUS | Status: DC

## 2023-03-21 MED ORDER — METOCLOPRAMIDE HCL 5 MG/ML IJ SOLN
5.0000 mg | Freq: Three times a day (TID) | INTRAMUSCULAR | Status: DC | PRN
  Administered 2023-03-22: 10 mg via INTRAVENOUS
  Filled 2023-03-21 (×2): qty 2

## 2023-03-21 MED ORDER — METHOCARBAMOL 500 MG PO TABS
500.0000 mg | ORAL_TABLET | Freq: Four times a day (QID) | ORAL | Status: DC | PRN
  Administered 2023-03-22 – 2023-03-24 (×5): 500 mg via ORAL
  Filled 2023-03-21 (×5): qty 1

## 2023-03-21 MED ORDER — PROPOFOL 500 MG/50ML IV EMUL
INTRAVENOUS | Status: DC | PRN
  Administered 2023-03-21: 50 ug/kg/min via INTRAVENOUS

## 2023-03-21 MED ORDER — MAGNESIUM CITRATE PO SOLN: 1.0000 | Freq: Once | ORAL | Status: DC | PRN

## 2023-03-21 MED ORDER — ORAL CARE MOUTH RINSE: 15.0000 mL | Freq: Once | OROMUCOSAL | Status: AC

## 2023-03-21 MED ORDER — METHOCARBAMOL 500 MG PO TABS: 500.0000 mg | ORAL_TABLET | Freq: Three times a day (TID) | ORAL | 1 refills | Status: DC | PRN

## 2023-03-21 MED ORDER — STERILE WATER FOR IRRIGATION IR SOLN
Status: DC | PRN
  Administered 2023-03-21: 2000 mL

## 2023-03-21 MED ORDER — OXYCODONE HCL 5 MG PO TABS
5.0000 mg | ORAL_TABLET | ORAL | Status: DC | PRN
  Administered 2023-03-21 – 2023-03-22 (×2): 10 mg via ORAL
  Filled 2023-03-21 (×3): qty 2
  Filled 2023-03-21: qty 1
  Filled 2023-03-21: qty 2
  Filled 2023-03-21: qty 1
  Filled 2023-03-21: qty 2

## 2023-03-21 MED ORDER — BUPIVACAINE LIPOSOME 1.3 % IJ SUSP
INTRAMUSCULAR | Status: DC | PRN
  Administered 2023-03-21: 20 mL

## 2023-03-21 MED ORDER — BUPIVACAINE LIPOSOME 1.3 % IJ SUSP
INTRAMUSCULAR | Status: AC
  Filled 2023-03-21: qty 20

## 2023-03-21 MED ORDER — OXYCODONE HCL 5 MG PO TABS
5.0000 mg | ORAL_TABLET | ORAL | 0 refills | Status: DC | PRN
Start: 2023-03-21 — End: 2023-03-25

## 2023-03-21 MED ORDER — BUPIVACAINE IN DEXTROSE 0.75-8.25 % IT SOLN
INTRATHECAL | Status: DC | PRN
  Administered 2023-03-21: 2 mL via INTRATHECAL

## 2023-03-21 MED ORDER — ASPIRIN 81 MG PO TBEC: 81.0000 mg | DELAYED_RELEASE_TABLET | Freq: Two times a day (BID) | ORAL | 1 refills | Status: DC

## 2023-03-21 MED ORDER — LIDOCAINE HCL (PF) 2 % IJ SOLN
INTRAMUSCULAR | Status: DC | PRN
Start: 2023-03-21 — End: 2023-03-21
  Administered 2023-03-21: 20 mg via INTRADERMAL

## 2023-03-21 MED ORDER — SODIUM CHLORIDE 0.9 % IR SOLN
Status: DC | PRN
  Administered 2023-03-21: 1000 mL

## 2023-03-21 MED ORDER — ACETAMINOPHEN 500 MG PO TABS
1000.0000 mg | ORAL_TABLET | Freq: Four times a day (QID) | ORAL | Status: AC
  Administered 2023-03-21 – 2023-03-22 (×3): 1000 mg via ORAL
  Filled 2023-03-21 (×4): qty 2

## 2023-03-21 MED ORDER — HYDROCHLOROTHIAZIDE 25 MG PO TABS
25.0000 mg | ORAL_TABLET | Freq: Every day | ORAL | Status: DC
  Administered 2023-03-21 – 2023-03-25 (×5): 25 mg via ORAL
  Filled 2023-03-21 (×5): qty 1

## 2023-03-21 MED ORDER — TRIAMCINOLONE ACETONIDE 0.1 % EX CREA: TOPICAL_CREAM | Freq: Two times a day (BID) | CUTANEOUS | Status: DC | PRN

## 2023-03-21 MED ORDER — TRANEXAMIC ACID-NACL 1000-0.7 MG/100ML-% IV SOLN
1000.0000 mg | INTRAVENOUS | Status: AC
  Administered 2023-03-21: 1000 mg via INTRAVENOUS
  Filled 2023-03-21: qty 100

## 2023-03-21 MED ORDER — TIMOLOL MALEATE 0.5 % OP SOLN
1.0000 [drp] | Freq: Two times a day (BID) | OPHTHALMIC | Status: DC
  Administered 2023-03-21 – 2023-03-25 (×7): 1 [drp] via OPHTHALMIC
  Filled 2023-03-21: qty 5

## 2023-03-21 MED ORDER — MENTHOL 3 MG MT LOZG: 1.0000 | LOZENGE | OROMUCOSAL | Status: DC | PRN

## 2023-03-21 MED ORDER — OXYCODONE HCL 5 MG/5ML PO SOLN: 5.0000 mg | Freq: Once | ORAL | Status: DC | PRN

## 2023-03-21 MED ORDER — DEXAMETHASONE SODIUM PHOSPHATE 10 MG/ML IJ SOLN
INTRAMUSCULAR | Status: DC | PRN
Start: 2023-03-21 — End: 2023-03-21
  Administered 2023-03-21: 10 mg

## 2023-03-21 MED ORDER — DIPHENHYDRAMINE HCL 12.5 MG/5ML PO ELIX: 12.5000 mg | ORAL_SOLUTION | ORAL | Status: DC | PRN

## 2023-03-21 MED ORDER — BUPIVACAINE-EPINEPHRINE 0.25% -1:200000 IJ SOLN
INTRAMUSCULAR | Status: AC
  Filled 2023-03-21: qty 1

## 2023-03-21 MED ORDER — ACETAMINOPHEN 500 MG PO TABS: 1000.0000 mg | ORAL_TABLET | Freq: Once | ORAL | Status: DC

## 2023-03-21 MED ORDER — ACETAMINOPHEN 325 MG PO TABS
325.0000 mg | ORAL_TABLET | Freq: Four times a day (QID) | ORAL | Status: DC | PRN
  Administered 2023-03-23: 650 mg via ORAL
  Filled 2023-03-21: qty 2

## 2023-03-21 MED ORDER — LIDOCAINE HCL (PF) 2 % IJ SOLN
INTRAMUSCULAR | Status: AC
  Filled 2023-03-21: qty 5

## 2023-03-21 MED ORDER — BISACODYL 5 MG PO TBEC
5.0000 mg | DELAYED_RELEASE_TABLET | Freq: Every day | ORAL | Status: DC | PRN
  Administered 2023-03-24 – 2023-03-25 (×2): 5 mg via ORAL
  Filled 2023-03-21 (×2): qty 1

## 2023-03-21 MED ORDER — ONDANSETRON HCL 4 MG PO TABS: 4.0000 mg | ORAL_TABLET | Freq: Four times a day (QID) | ORAL | Status: DC | PRN

## 2023-03-21 MED ORDER — METHOCARBAMOL 1000 MG/10ML IJ SOLN: 500.0000 mg | Freq: Four times a day (QID) | INTRAMUSCULAR | Status: DC | PRN

## 2023-03-21 MED ORDER — HYDROMORPHONE HCL 1 MG/ML IJ SOLN: 0.2500 mg | INTRAMUSCULAR | Status: DC | PRN

## 2023-03-21 MED ORDER — VITAMIN D 25 MCG (1000 UNIT) PO TABS
1000.0000 [IU] | ORAL_TABLET | Freq: Every day | ORAL | Status: DC
  Administered 2023-03-22 – 2023-03-25 (×4): 1000 [IU] via ORAL
  Filled 2023-03-21 (×4): qty 1

## 2023-03-21 MED ORDER — BUPIVACAINE-EPINEPHRINE 0.25% -1:200000 IJ SOLN
INTRAMUSCULAR | Status: DC | PRN
  Administered 2023-03-21: 30 mL

## 2023-03-21 MED ORDER — PHENOL 1.4 % MT LIQD: 1.0000 | OROMUCOSAL | Status: DC | PRN

## 2023-03-21 MED ORDER — RISAQUAD PO CAPS: 1.0000 | ORAL_CAPSULE | Freq: Every day | ORAL | Status: DC

## 2023-03-21 MED ORDER — HYDROMORPHONE HCL 1 MG/ML IJ SOLN
0.5000 mg | INTRAMUSCULAR | Status: DC | PRN
  Administered 2023-03-21 – 2023-03-22 (×3): 1 mg via INTRAVENOUS
  Filled 2023-03-21 (×4): qty 1

## 2023-03-21 MED ORDER — PHENYLEPHRINE HCL-NACL 20-0.9 MG/250ML-% IV SOLN
INTRAVENOUS | Status: DC | PRN
  Administered 2023-03-21: 35 ug/min via INTRAVENOUS

## 2023-03-21 MED ORDER — OXYCODONE HCL 5 MG PO TABS
10.0000 mg | ORAL_TABLET | ORAL | Status: DC | PRN
  Administered 2023-03-21: 10 mg via ORAL
  Administered 2023-03-22: 15 mg via ORAL
  Administered 2023-03-22 (×2): 10 mg via ORAL
  Administered 2023-03-23 – 2023-03-24 (×6): 15 mg via ORAL
  Administered 2023-03-25 (×2): 10 mg via ORAL
  Filled 2023-03-21: qty 2
  Filled 2023-03-21 (×2): qty 3
  Filled 2023-03-21: qty 2
  Filled 2023-03-21 (×5): qty 3

## 2023-03-21 MED ORDER — SODIUM CHLORIDE 0.9% FLUSH
INTRAVENOUS | Status: DC | PRN
  Administered 2023-03-21: 40 mL

## 2023-03-21 MED ORDER — 0.9 % SODIUM CHLORIDE (POUR BTL) OPTIME
TOPICAL | Status: DC | PRN
  Administered 2023-03-21: 1000 mL

## 2023-03-21 MED ORDER — LINACLOTIDE 145 MCG PO CAPS: 290.0000 ug | ORAL_CAPSULE | Freq: Every day | ORAL | Status: DC | PRN

## 2023-03-21 MED ORDER — FENTANYL CITRATE PF 50 MCG/ML IJ SOSY
50.0000 ug | PREFILLED_SYRINGE | INTRAMUSCULAR | Status: DC
  Administered 2023-03-21: 50 ug via INTRAVENOUS
  Filled 2023-03-21: qty 2

## 2023-03-21 MED ORDER — PANTOPRAZOLE SODIUM 40 MG PO TBEC
40.0000 mg | DELAYED_RELEASE_TABLET | Freq: Every day | ORAL | Status: DC
  Administered 2023-03-22 – 2023-03-25 (×4): 40 mg via ORAL
  Filled 2023-03-21 (×4): qty 1

## 2023-03-21 MED ORDER — ONDANSETRON HCL 4 MG/2ML IJ SOLN: 4.0000 mg | Freq: Once | INTRAMUSCULAR | Status: DC | PRN

## 2023-03-21 MED ORDER — CHLORHEXIDINE GLUCONATE 0.12 % MT SOLN
15.0000 mL | Freq: Once | OROMUCOSAL | Status: AC
  Administered 2023-03-21: 15 mL via OROMUCOSAL

## 2023-03-21 MED ORDER — ACETAMINOPHEN 10 MG/ML IV SOLN
1000.0000 mg | INTRAVENOUS | Status: AC
  Administered 2023-03-21: 1000 mg via INTRAVENOUS
  Filled 2023-03-21: qty 100

## 2023-03-21 MED ORDER — ONDANSETRON HCL 4 MG/2ML IJ SOLN
INTRAMUSCULAR | Status: DC | PRN
  Administered 2023-03-21: 4 mg via INTRAVENOUS

## 2023-03-21 MED ORDER — ASPIRIN 81 MG PO CHEW
81.0000 mg | CHEWABLE_TABLET | Freq: Two times a day (BID) | ORAL | Status: DC
  Administered 2023-03-22 – 2023-03-25 (×7): 81 mg via ORAL
  Filled 2023-03-21 (×7): qty 1

## 2023-03-21 MED ORDER — METOCLOPRAMIDE HCL 5 MG PO TABS: 5.0000 mg | ORAL_TABLET | Freq: Three times a day (TID) | ORAL | Status: DC | PRN

## 2023-03-21 MED ORDER — PROPOFOL 10 MG/ML IV BOLUS
INTRAVENOUS | Status: DC | PRN
  Administered 2023-03-21: 30 mg via INTRAVENOUS

## 2023-03-21 MED ORDER — MIDAZOLAM HCL 2 MG/2ML IJ SOLN
1.0000 mg | INTRAMUSCULAR | Status: DC
  Administered 2023-03-21: 2 mg via INTRAVENOUS
  Filled 2023-03-21: qty 2

## 2023-03-21 MED ORDER — DOCUSATE SODIUM 100 MG PO CAPS
100.0000 mg | ORAL_CAPSULE | Freq: Two times a day (BID) | ORAL | Status: DC
  Administered 2023-03-21 – 2023-03-25 (×8): 100 mg via ORAL
  Filled 2023-03-21 (×8): qty 1

## 2023-03-21 MED ORDER — SACCHAROMYCES BOULARDII 250 MG PO CAPS
250.0000 mg | ORAL_CAPSULE | Freq: Every day | ORAL | Status: DC
  Administered 2023-03-22 – 2023-03-25 (×4): 250 mg via ORAL
  Filled 2023-03-21 (×4): qty 1

## 2023-03-21 MED ORDER — OXYCODONE HCL 5 MG PO TABS: 5.0000 mg | ORAL_TABLET | Freq: Once | ORAL | Status: DC | PRN

## 2023-03-21 SURGICAL SUPPLY — 76 items
AGENT HMST SPONGE THK3/8 (HEMOSTASIS)
ATTUNE PS FEM RT SZ 5 CEM KNEE (Femur) IMPLANT
ATTUNE PSRP INSR SZ5 8 KNEE (Insert) IMPLANT
BAG COUNTER SPONGE SURGICOUNT (BAG) IMPLANT
BAG DECANTER FOR FLEXI CONT (MISCELLANEOUS) ×1 IMPLANT
BAG SPEC THK2 15X12 ZIP CLS (MISCELLANEOUS)
BAG SPNG CNTER NS LX DISP (BAG) ×1
BAG ZIPLOCK 12X15 (MISCELLANEOUS) IMPLANT
BASE TIBIAL ROT PLAT SZ 5 KNEE (Knees) IMPLANT
BLADE SAW SGTL 11.0X1.19X90.0M (BLADE) ×1 IMPLANT
BLADE SAW SGTL 13.0X1.19X90.0M (BLADE) ×1 IMPLANT
BLADE SURG SZ10 CARB STEEL (BLADE) ×2 IMPLANT
BNDG CMPR 5X4 KNIT ELC UNQ LF (GAUZE/BANDAGES/DRESSINGS) ×1
BNDG CMPR 6 X 5 YARDS HK CLSR (GAUZE/BANDAGES/DRESSINGS) ×1
BNDG ELASTIC 4INX 5YD STR LF (GAUZE/BANDAGES/DRESSINGS) ×1 IMPLANT
BNDG ELASTIC 6INX 5YD STR LF (GAUZE/BANDAGES/DRESSINGS) ×1 IMPLANT
BOWL SMART MIX CTS (DISPOSABLE) ×1 IMPLANT
BSPLAT TIB 5 CMNT ROT PLAT STR (Knees) ×1 IMPLANT
CEMENT HV SMART SET (Cement) ×2 IMPLANT
COVER SURGICAL LIGHT HANDLE (MISCELLANEOUS) ×1 IMPLANT
CUFF TOURN SGL QUICK 34 (TOURNIQUET CUFF) ×1
CUFF TRNQT CYL 34X4.125X (TOURNIQUET CUFF) ×1 IMPLANT
DRAPE INCISE IOBAN 66X45 STRL (DRAPES) IMPLANT
DRAPE ORTHO SPLIT 77X108 STRL (DRAPES) ×2
DRAPE SHEET LG 3/4 BI-LAMINATE (DRAPES) ×1 IMPLANT
DRAPE SURG ORHT 6 SPLT 77X108 (DRAPES) ×2 IMPLANT
DRAPE TOP 10253 STERILE (DRAPES) ×1 IMPLANT
DRAPE U-SHAPE 47X51 STRL (DRAPES) ×1 IMPLANT
DRSG AQUACEL AG ADV 3.5X10 (GAUZE/BANDAGES/DRESSINGS) ×1 IMPLANT
DRSG TEGADERM 4X4.75 (GAUZE/BANDAGES/DRESSINGS) IMPLANT
DURAPREP 26ML APPLICATOR (WOUND CARE) ×1 IMPLANT
ELECT BLADE TIP CTD 4 INCH (ELECTRODE) ×1 IMPLANT
ELECT REM PT RETURN 15FT ADLT (MISCELLANEOUS) ×1 IMPLANT
EVACUATOR 1/8 PVC DRAIN (DRAIN) IMPLANT
GAUZE SPONGE 2X2 8PLY STRL LF (GAUZE/BANDAGES/DRESSINGS) IMPLANT
GLOVE BIO SURGEON STRL SZ7 (GLOVE) ×1 IMPLANT
GLOVE BIOGEL PI IND STRL 7.0 (GLOVE) ×1 IMPLANT
GLOVE BIOGEL PI IND STRL 8 (GLOVE) ×1 IMPLANT
GLOVE SURG SS PI 8.0 STRL IVOR (GLOVE) ×1 IMPLANT
GOWN STRL REUS W/ TWL XL LVL3 (GOWN DISPOSABLE) ×2 IMPLANT
GOWN STRL REUS W/TWL XL LVL3 (GOWN DISPOSABLE) ×2
HANDPIECE INTERPULSE COAX TIP (DISPOSABLE) ×1
HEMOSTAT SPONGE AVITENE ULTRA (HEMOSTASIS) IMPLANT
HOLDER FOLEY CATH W/STRAP (MISCELLANEOUS) IMPLANT
IMMOBILIZER KNEE 20 (SOFTGOODS) ×1
IMMOBILIZER KNEE 20 THIGH 36 (SOFTGOODS) ×1 IMPLANT
KIT TURNOVER KIT A (KITS) IMPLANT
MANIFOLD NEPTUNE II (INSTRUMENTS) ×1 IMPLANT
NS IRRIG 1000ML POUR BTL (IV SOLUTION) IMPLANT
PACK TOTAL KNEE CUSTOM (KITS) ×1 IMPLANT
PATELLA MEDIAL ATTUN 35MM KNEE (Knees) IMPLANT
PIN STEINMAN FIXATION KNEE (PIN) IMPLANT
PROTECTOR NERVE ULNAR (MISCELLANEOUS) ×1 IMPLANT
SAW OSC TIP CART 19.5X105X1.3 (SAW) IMPLANT
SEALER BIPOLAR AQUA 6.0 (INSTRUMENTS) IMPLANT
SET HNDPC FAN SPRY TIP SCT (DISPOSABLE) ×1 IMPLANT
SOLUTION PRONTOSAN WOUND 350ML (IRRIGATION / IRRIGATOR) ×1 IMPLANT
SPIKE FLUID TRANSFER (MISCELLANEOUS) ×1 IMPLANT
STAPLER VISISTAT (STAPLE) IMPLANT
STRIP CLOSURE SKIN 1/2X4 (GAUZE/BANDAGES/DRESSINGS) IMPLANT
SUT BONE WAX W31G (SUTURE) ×1 IMPLANT
SUT MNCRL AB 4-0 PS2 18 (SUTURE) IMPLANT
SUT STRATAFIX 0 PDS 27 VIOLET (SUTURE) ×1
SUT VIC AB 1 CT1 27 (SUTURE) ×2
SUT VIC AB 1 CT1 27XBRD ANTBC (SUTURE) ×3 IMPLANT
SUT VIC AB 2-0 CT1 27 (SUTURE) ×2
SUT VIC AB 2-0 CT1 TAPERPNT 27 (SUTURE) ×3 IMPLANT
SUTURE STRATFX 0 PDS 27 VIOLET (SUTURE) ×1 IMPLANT
SYR 3ML LL SCALE MARK (SYRINGE) IMPLANT
TIBIAL BASE ROT PLAT SZ 5 KNEE (Knees) ×1 IMPLANT
TRAY FOLEY MTR SLVR 16FR STAT (SET/KITS/TRAYS/PACK) ×1 IMPLANT
TRAY FOLEY W/BAG SLVR 14FR LF (SET/KITS/TRAYS/PACK) IMPLANT
TUBE SUCTION HIGH CAP CLEAR NV (SUCTIONS) ×1 IMPLANT
WATER STERILE IRR 1000ML POUR (IV SOLUTION) ×1 IMPLANT
WIPE CHG 2% PREP (PERSONAL CARE ITEMS) ×1 IMPLANT
WRAP KNEE MAXI GEL POST OP (GAUZE/BANDAGES/DRESSINGS) ×1 IMPLANT

## 2023-03-21 NOTE — Discharge Instructions (Signed)
Elevate leg above heart 6x a day for each Use knee immobilizer while walking until can SLR x 10 Use knee immobilizer in bed to keep knee in extension Aquacel dressing may remain in place for 1 week. May shower with aquacel dressing in place. If the dressing becomes saturated or peels off, you may remove aquacel dressing. After one week, remove the aquacel dressing. Do not remove steri-strips if they are present. May leave open to air but if there is drainage, place new dressing with gauze and tape or ACE bandage which should be kept clean and dry and changed daily.  INSTRUCTIONS AFTER JOINT REPLACEMENT   Remove items at home which could result in a fall. This includes throw rugs or furniture in walking pathways ICE to the affected joint every three hours while awake for 30 minutes at a time, for at least the first 3-5 days, and then as needed for pain and swelling.  Continue to use ice for pain and swelling. You may notice swelling that will progress down to the foot and ankle.  This is normal after surgery.  Elevate your leg when you are not up walking on it.   Continue to use the breathing machine you got in the hospital (incentive spirometer) which will help keep your temperature down.  It is common for your temperature to cycle up and down following surgery, especially at night when you are not up moving around and exerting yourself.  The breathing machine keeps your lungs expanded and your temperature down.   DIET:  As you were doing prior to hospitalization, we recommend a well-balanced diet.  DRESSING / WOUND CARE / SHOWERING  Keep the surgical dressing on for one week.  The dressing is water proof, so you can shower without any extra covering.  IF THE DRESSING FALLS OFF or the wound gets wet inside, change the dressing with sterile gauze. After one week, remove the dressing and place a new dressing over if needed.  Please use good hand washing techniques before changing the  dressing.  Do not use any lotions or creams on the incision until instructed by your surgeon.    ACTIVITY  Increase activity slowly as tolerated, but follow the weight bearing instructions below.   No driving for 6 weeks or until further direction given by your physician.  You cannot drive while taking narcotics.  No lifting or carrying greater than 10 lbs. until further directed by your surgeon. Avoid periods of inactivity such as sitting longer than an hour when not asleep. This helps prevent blood clots.  You may return to work once you are authorized by your doctor.     WEIGHT BEARING   Weight bearing as tolerated with assist device (walker, cane, etc) as directed, use it as long as suggested by your surgeon or therapist, typically at least 4-6 weeks.   EXERCISES  Results after joint replacement surgery are often greatly improved when you follow the exercise, range of motion and muscle strengthening exercises prescribed by your doctor. Safety measures are also important to protect the joint from further injury. Any time any of these exercises cause you to have increased pain or swelling, decrease what you are doing until you are comfortable again and then slowly increase them. If you have problems or questions, call your caregiver or physical therapist for advice.   Rehabilitation is important following a joint replacement. After just a few days of immobilization, the muscles of the leg can become weakened and shrink (  atrophy).  These exercises are designed to build up the tone and strength of the thigh and leg muscles and to improve motion. Often times heat used for twenty to thirty minutes before working out will loosen up your tissues and help with improving the range of motion but do not use heat for the first two weeks following surgery (sometimes heat can increase post-operative swelling).   These exercises can be done on a training (exercise) mat, on the floor, on a table or on a  bed. Use whatever works the best and is most comfortable for you.    Use music or television while you are exercising so that the exercises are a pleasant break in your day. This will make your life better with the exercises acting as a break in your routine that you can look forward to.   Perform all exercises about fifteen times, three times per day or as directed.  You should exercise both the operative leg and the other leg as well.  Exercises include:   Quad Sets - Tighten up the muscle on the front of the thigh (Quad) and hold for 5-10 seconds.   Straight Leg Raises - With your knee straight (if you were given a brace, keep it on), lift the leg to 60 degrees, hold for 3 seconds, and slowly lower the leg.  Perform this exercise against resistance later as your leg gets stronger.  Leg Slides: Lying on your back, slowly slide your foot toward your buttocks, bending your knee up off the floor (only go as far as is comfortable). Then slowly slide your foot back down until your leg is flat on the floor again.  Angel Wings: Lying on your back spread your legs to the side as far apart as you can without causing discomfort.  Hamstring Strength:  Lying on your back, push your heel against the floor with your leg straight by tightening up the muscles of your buttocks.  Repeat, but this time bend your knee to a comfortable angle, and push your heel against the floor.  You may put a pillow under the heel to make it more comfortable if necessary.   A rehabilitation program following joint replacement surgery can speed recovery and prevent re-injury in the future due to weakened muscles. Contact your doctor or a physical therapist for more information on knee rehabilitation.    CONSTIPATION  Constipation is defined medically as fewer than three stools per week and severe constipation as less than one stool per week.  Even if you have a regular bowel pattern at home, your normal regimen is likely to be  disrupted due to multiple reasons following surgery.  Combination of anesthesia, postoperative narcotics, change in appetite and fluid intake all can affect your bowels.   YOU MUST use at least one of the following options; they are listed in order of increasing strength to get the job done.  They are all available over the counter, and you may need to use some, POSSIBLY even all of these options:    Drink plenty of fluids (prune juice may be helpful) and high fiber foods Colace 100 mg by mouth twice a day  Senokot for constipation as directed and as needed Dulcolax (bisacodyl), take with full glass of water  Miralax (polyethylene glycol) once or twice a day as needed.  If you have tried all these things and are unable to have a bowel movement in the first 3-4 days after surgery call either your surgeon or  your primary doctor.    If you experience loose stools or diarrhea, hold the medications until you stool forms back up.  If your symptoms do not get better within 1 week or if they get worse, check with your doctor.  If you experience "the worst abdominal pain ever" or develop nausea or vomiting, please contact the office immediately for further recommendations for treatment.   ITCHING:  If you experience itching with your medications, try taking only a single pain pill, or even half a pain pill at a time.  You can also use Benadryl over the counter for itching or also to help with sleep.   TED HOSE STOCKINGS:  Use stockings on both legs until for at least 2 weeks or as directed by physician office. They may be removed at night for sleeping.  MEDICATIONS:  See your medication summary on the "After Visit Summary" that nursing will review with you.  You may have some home medications which will be placed on hold until you complete the course of blood thinner medication.  It is important for you to complete the blood thinner medication as prescribed.  PRECAUTIONS:  If you experience chest pain or  shortness of breath - call 911 immediately for transfer to the hospital emergency department.   If you develop a fever greater that 101 F, purulent drainage from wound, increased redness or drainage from wound, foul odor from the wound/dressing, or calf pain - CONTACT YOUR SURGEON.                                                   FOLLOW-UP APPOINTMENTS:  If you do not already have a post-op appointment, please call the office for an appointment to be seen by your surgeon.  Guidelines for how soon to be seen are listed in your "After Visit Summary", but are typically between 1-4 weeks after surgery.  OTHER INSTRUCTIONS:   Knee Replacement:  Do not place pillow under knee, focus on keeping the knee straight while resting. CPM instructions: 0-90 degrees, 2 hours in the morning, 2 hours in the afternoon, and 2 hours in the evening. Place foam block, curve side up under heel at all times except when in CPM or when walking.  DO NOT modify, tear, cut, or change the foam block in any way.  POST-OPERATIVE OPIOID TAPER INSTRUCTIONS: It is important to wean off of your opioid medication as soon as possible. If you do not need pain medication after your surgery it is ok to stop day one. Opioids include: Codeine, Hydrocodone(Norco, Vicodin), Oxycodone(Percocet, oxycontin) and hydromorphone amongst others.  Long term and even short term use of opiods can cause: Increased pain response Dependence Constipation Depression Respiratory depression And more.  Withdrawal symptoms can include Flu like symptoms Nausea, vomiting And more Techniques to manage these symptoms Hydrate well Eat regular healthy meals Stay active Use relaxation techniques(deep breathing, meditating, yoga) Do Not substitute Alcohol to help with tapering If you have been on opioids for less than two weeks and do not have pain than it is ok to stop all together.  Plan to wean off of opioids This plan should start within one week post  op of your joint replacement. Maintain the same interval or time between taking each dose and first decrease the dose.  Cut the total daily intake of opioids  by one tablet each day Next start to increase the time between doses. The last dose that should be eliminated is the evening dose.   MAKE SURE YOU:  Understand these instructions.  Get help right away if you are not doing well or get worse.    Thank you for letting us be a part of your medical care team.  It is a privilege we respect greatly.  We hope these instructions will help you stay on track for a fast and full recovery!

## 2023-03-21 NOTE — Transfer of Care (Signed)
Immediate Anesthesia Transfer of Care Note  Patient: [name]  Procedure(s) Performed: TOTAL KNEE ARTHROPLASTY (Right: Knee)  Patient Location: PACU  Anesthesia Type:Spinal  Level of Consciousness: awake and alert   Airway & Oxygen Therapy: Patient Spontanous Breathing and Patient connected to face mask oxygen  Post-op Assessment: Report given to RN and Post -op Vital signs reviewed and stable  Post vital signs: Reviewed and stable  Last Vitals:  Vitals Value Taken Time  BP 121/78 03/21/23 1526  Temp    Pulse 96 03/21/23 1527  Resp 17 03/21/23 1527  SpO2 100 % 03/21/23 1527  Vitals shown include unfiled device data.  Last Pain:  Vitals:   03/21/23 0916  TempSrc: Oral  PainSc: 2       Patients Stated Pain Goal: 3 (03/21/23 0916)  Complications: No notable events documented.

## 2023-03-21 NOTE — Anesthesia Procedure Notes (Signed)
Anesthesia Regional Block: Adductor canal block   Pre-Anesthetic Checklist: , timeout performed,  Correct Patient, Correct Site, Correct Laterality,  Correct Procedure, Correct Position, site marked,  Risks and benefits discussed,  Surgical consent,  Pre-op evaluation,  At surgeon's request and post-op pain management  Laterality: Right  Prep: Maximum Sterile Barrier Precautions used, chloraprep       Needles:  Injection technique: Single-shot  Needle Type: Echogenic Stimulator Needle     Needle Length: 9cm  Needle Gauge: 22     Additional Needles:   Procedures:,,,, ultrasound used (permanent image in chart),,    Narrative:  Start time: 03/21/2023 11:15 AM End time: 03/21/2023 11:20 AM Injection made incrementally with aspirations every 5 mL.  Performed by: Personally  Anesthesiologist: Lannie Fields, DO  Additional Notes: Monitors applied. No increased pain on injection. No increased resistance to injection. Injection made in 5cc increments. Good needle visualization. Patient tolerated procedure well.

## 2023-03-21 NOTE — Plan of Care (Signed)
[facility] Tonsillectomy/Adenoidectomy, Postoperative PEDS care plan entered in error.

## 2023-03-21 NOTE — Op Note (Unsigned)
NAME: [name], THUNBERG MEDICAL RECORD NO: [id number] ACCOUNT NO: [id number] DATE OF BIRTH: 30-Jul-1955 FACILITY: [facility] LOCATION: [id number] PHYSICIAN: Javier Docker, MD  Operative Report   DATE OF PROCEDURE: 03/21/2023  PREOPERATIVE DIAGNOSIS:  End-stage osteoarthrosis lateral compartment of the right knee.  POSTOPERATIVE DIAGNOSIS:  End-stage osteoarthrosis lateral compartment of the right knee.  PROCEDURE PERFORMED:  Right total knee arthroplasty utilizing Attune rotating platform 5 femur, 5 tibia, 8 mm insert, 35 patella.  ANESTHESIA:  Spinal.  ASSISTANT:  Andrez Grime, PA.  HISTORY:  67 year old female with persistent knee pain despite arthroscopic debridement.  The patient had grade IV changes lateral tibial plateau.  She was indicated for replacement of the degenerated joint, failing conservative treatment.  Risks and  benefits discussed including bleeding, infection, damage to neurovascular structures, no change in symptoms, worsening symptoms, DVT, PE, anesthetic complications, etc.  DESCRIPTION OF PROCEDURE:  With the patient in supine position, after induction of adequate spinal anesthesia, 2 grams Kefzol, the right lower extremity was prepped and draped and exsanguinated in usual sterile fashion.  Thigh tourniquet inflated to 225  mmHg.  Midline incision was then made over the knee.  Full thickness flaps developed.  Median parapatellar arthrotomy was performed.  Patella was gently everted, knee was flexed.  Lateral compartment bone-on-bone arthrosis was noted as well in the  patellofemoral joint.  Remnants of the medial and lateral menisci were excised as was the ACL.  The patient had some bleeding noted due to elevated blood pressure.  We used electrocautery and inflated the tourniquet to 275 mmHg and attention to  anesthesia for hypertensive anesthesia.  Once it was complete, we proceeded.  I placed a notch above the femoral notch as a starting point for the femoral drill.  This  was drilled in line with the femur, irrigated, T-handle placed and then intramedullary  guide, 5-degree right 9 off the distal femur pinned performed this cut.  I then subluxed the tibia.  Remnants of medial and lateral menisci were excised.  Using external alignment guide the low side was laterally placed 3 off the defect, which measured  7 off the medial side defect laterally.  This was pinned with an external alignment guide, bisecting the tibiotalar joint parallel to the shaft, 3 degrees slope.  I performed this cut.  Then, we used an extension block, a 6 in extension and it was  satisfactory.  I then reflexed the knee, sized the femur off the anterior cortex to be a 5.  This is in 3 degrees of external rotation.  This was pinned and I performed a distal block cutting guide and I performed anterior, posterior and chamfer cuts.   Soft tissues protected at all times.  Turned attention back to the tibia, measured to a 5 maximizing the coverage for the baseplate, harvesting the bone centrally and impacted into the distal femur, drilling centrally punch guide.  Attention back to the  femur.  Box cutting guide, bisecting the condyle, pinned performed a box cut and then a trial femur.  A 6 mm insert, reduced the knee and had full extension, full flexion, good stability with varus and valgus stressing at 0 and 30 degrees.  Very slight  anterior drawer.  Everted the patella, measured to a 23, planed it to a 14 with a patellar jig.  Residual, sized to a 35 paddle parallel to the joint surface drilling our peg holes, placed a trial patella, reducing it and having a good patellofemoral  tracking although slightly tight  laterally I performed a partial lateral release from the inside slightly on the outside.  Then, all instrumentation was removed and checked posteriorly.  Remnants of medial menisci were excised.  Popliteus and capsule was  intact.  I cauterized geniculates with the Aquamantys. Aquamantys was utilized  due to the initial bleeding.  I used pulsatile lavage.  All surfaces thoroughly clean, flexed the knee, all surfaces thoroughly dried.  Mixed cement on back table under  vacuum.  I then placed cement in proximal tibia, digitally pressurizing it.  Cement was placed in tibial tray and impacted into place.  Cement was placed on the femoral component and the femur impacted into place.  I placed a 7 insert, reduced it, held  in axial load throughout the curing of the cement, cemented and clamped the patella.  Marcaine with epinephrine was placed in the wound as was Prontosan and the wound was covered during the curing the cement.  Following the curing of the cement I used  pulsatile lavage to thoroughly clean the knee and then with Prontosan flexed the knee.  I removed the 7 tried an 8 and 8 fit better as it had negative anterior drawer and had full extension, full flexion and good stability.  I meticulously removed all  redundant cement, copiously irrigated once again with Prontosan and pulsatile lavage and I placed an 8 mm insert, reduced the knee and had full extension, full flexion, good stability to varus valgus stressing at 0-30 degrees, negative anterior drawer.   The permanent had been placed.  Knee in the mid flexion reapproximated patellar arthrotomy with 1 Vicryl in interrupted figure-of-eight suture oversewn with a running Stratafix.  Following this, I had full flexion, full extension, good stability to varus  and valgus stress at 0 and 30 degrees, negative anterior drawer.  Excellent patellofemoral tracking.  Irrigated subcutaneous tissues, closed with 2-0 and then with subcuticular Monocryl.  Sterile dressing was applied.  Placed in immobilizer and  transported to the recovery room in satisfactory condition.  The patient tolerated the procedure well.  No complications.  ASSISTANT:  Andrez Grime, PA, was used throughout the case for patient positioning, exposure, closure.  BLOOD LOSS:  300  mL.   PUS D: 03/21/2023 3:06:23 pm T: 03/21/2023 5:19:00 pm  JOB: [id number]/ [id number]

## 2023-03-21 NOTE — Evaluation (Signed)
Physical Therapy Evaluation Patient Details Name: [name] MRN: [id number] DOB: [birth year] Today's Date: 03/21/2023  History of Present Illness  67 yo female presents to therapy s/p R TKA on 03/21/2023 due to failure of conservative measures. Pt PMH includes but is not limited to back pain, fibromyalgia, GERD, HTN, HLD, IBS, and neuromuscular disorder.  Clinical Impression      [name] is a 67 y.o. female POD 0 s/p R TKA. Patient reports mod i with mobility at baseline. Patient is now limited by functional impairments (see PT problem list below) and requires min A for bed mobility and mod A for sit to stand from elevated EOB. Once in standing pt reported increased pain R LE 8/10, nausea and light headedness. Pt unable to safely complete SPT to recliner or progress with gait tasks at time of eval. Nursing made aware and provided medication to address nausea. Pt required max A for sit to supine. Pt left in bed all needs in place, visitors present. Pt expressed concern per d/c planning pt lives at home alone and indicated family would check in on her but not able to stay overnight. Pt indicated wanting to participate with short term rehab at [facility]. PT unclear at this time of PT setting s/p d/c from hospital.  Patient will benefit from continued skilled PT interventions to address impairments and progress towards PLOF. Acute PT will follow to progress mobility and stair training in preparation for safe discharge home.     If plan is discharge home, recommend the following: A lot of help with walking and/or transfers;A little help with bathing/dressing/bathroom;Assistance with cooking/housework;Assist for transportation;Help with stairs or ramp for entrance   Can travel by private vehicle        Equipment Recommendations Rolling walker (2 wheels)  Recommendations for Other Services       Functional Status Assessment Patient has had a recent decline in their functional status and  demonstrates the ability to make significant improvements in function in a reasonable and predictable amount of time.     Precautions / Restrictions Precautions Precautions: Knee;Fall Restrictions Weight Bearing Restrictions: Yes RLE Weight Bearing: Weight bearing as tolerated      Mobility  Bed Mobility Overal bed mobility: Needs Assistance Bed Mobility: Supine to Sit, Sit to Supine     Supine to sit: Min assist, HOB elevated, Used rails Sit to supine: Max assist   General bed mobility comments: min cues and increased time wtih asssit to manage R LE to EOB, pt required max A for sit to supine due to reports of increasing R knee pain, nausea and light headedness    Transfers Overall transfer level: Needs assistance Equipment used: Rolling walker (2 wheels) Transfers: Sit to/from Stand Sit to Stand: From elevated surface, Mod assist           General transfer comment: cues for power up and extension posture with pull to stand from elevated EOB    Ambulation/Gait               General Gait Details: NT due to pt reports of light headedness and nausea when in standing and progressing toward pre gait tasks  Stairs            Wheelchair Mobility     Tilt Bed    Modified Rankin (Stroke Patients Only)       Balance Overall balance assessment: Needs assistance, History of Falls (recent fall down 13 steps in home with noted R LE  bruises and edema) Sitting-balance support: Feet supported Sitting balance-[name] Scale: Good     Standing balance support: Bilateral upper extremity supported, During functional activity, Reliant on assistive device for balance Standing balance-[name] Scale: Poor                               Pertinent Vitals/Pain Pain Assessment Pain Assessment: 0-10 Pain Score: 8  Pain Location: R LE/knee (pain medication administred prior to PT arrival) Pain Descriptors / Indicators: Aching, Constant, Discomfort, Grimacing,  Operative site guarding Pain Intervention(s): Limited activity within patient's tolerance, Monitored during session, Premedicated before session, Ice applied, Repositioned    Home Living Family/patient expects to be discharged to:: Private residence Living Arrangements: Alone Available Help at Discharge: Family Type of Home: House Home Access: Stairs to enter Entrance Stairs-Rails: None Entrance Stairs-Number of Steps: 1 Alternate Level Stairs-Number of Steps: 13 Home Layout: Two level;Able to live on main level with bedroom/bathroom Home Equipment: Cane - single point;Standard Walker      Prior Function Prior Level of Function : Independent/Modified Independent;Driving             Mobility Comments: mod I with SPC for all ADLs, self care tasks, IADLs       Extremity/Trunk Assessment        Lower Extremity Assessment Lower Extremity Assessment: RLE deficits/detail RLE Deficits / Details: ankle DF 5/5: PF 4/5: SLR < 10 degree lag RLE Sensation: decreased light touch    Cervical / Trunk Assessment Cervical / Trunk Assessment: Normal  Communication   Communication Communication: No apparent difficulties  Cognition Arousal: Alert Behavior During Therapy: WFL for tasks assessed/performed Overall Cognitive Status: Within Functional Limits for tasks assessed                                          General Comments General comments (skin integrity, edema, etc.): nurse provided pt with medication to address nausea during PT eval    Exercises     Assessment/Plan    PT Assessment Patient needs continued PT services  PT Problem List Decreased strength;Decreased range of motion;Decreased activity tolerance;Decreased balance;Decreased mobility;Decreased coordination;Pain       PT Treatment Interventions DME instruction;Gait training;Stair training;Functional mobility training;Therapeutic activities;Therapeutic exercise;Balance training;Neuromuscular  re-education;Patient/family education;Modalities    PT Goals (Current goals can be found in the Care Plan section)  Acute Rehab PT Goals Patient Stated Goal: to be able to return to water areobics at the [facility] PT Goal Formulation: With patient Time For Goal Achievement: 04/11/23 Potential to Achieve Goals: Good    Frequency 7X/week     Co-evaluation               AM-PAC PT "6 Clicks" Mobility  Outcome Measure Help needed turning from your back to your side while in a flat bed without using bedrails?: A Little Help needed moving from lying on your back to sitting on the side of a flat bed without using bedrails?: A Little Help needed moving to and from a bed to a chair (including a wheelchair)?: A Little Help needed standing up from a chair using your arms (e.g., wheelchair or bedside chair)?: A Lot Help needed to walk in hospital room?: Total Help needed climbing 3-5 steps with a railing? : Total 6 Click Score: 13    End of Session Equipment Utilized During Treatment:  Gait belt Activity Tolerance: Patient limited by pain;Treatment limited secondary to medical complications (Comment) (nausea and light headedness) Patient left: with call bell/phone within reach;in bed;with bed alarm set;with family/visitor present Nurse Communication: Mobility status PT Visit Diagnosis: Unsteadiness on feet (R26.81);Other abnormalities of gait and mobility (R26.89);Muscle weakness (generalized) (M62.81);History of falling (Z91.81);Difficulty in walking, not elsewhere classified (R26.2);Pain Pain - Right/Left: Right Pain - part of body: Leg;Knee    Time: 2130-8657 PT Time Calculation (min) (ACUTE ONLY): 29 min   Charges:   PT Evaluation $PT Eval Low Complexity: 1 Low PT Treatments $Therapeutic Activity: 8-22 mins PT General Charges $$ ACUTE PT VISIT: 1 Visit         Johnny Bridge, PT Acute Rehab   Jacqualyn Posey 03/21/2023, 7:11 PM

## 2023-03-21 NOTE — Interval H&P Note (Signed)
History and Physical Interval Note:  03/21/2023 12:33 PM  [name]  has presented today for surgery, with the diagnosis of Right knee osteoarthritis.  The various methods of treatment have been discussed with the patient and family. After consideration of risks, benefits and other options for treatment, the patient has consented to  Procedure(s) with comments: TOTAL KNEE ARTHROPLASTY (Right) - 150 min as a surgical intervention.  The patient's history has been reviewed, patient examined, no change in status, stable for surgery.  I have reviewed the patient's chart and labs.  Questions were answered to the patient's satisfaction.     Javier Docker

## 2023-03-21 NOTE — Brief Op Note (Signed)
03/21/2023  12:34 PM  PATIENT:  Sande Brothers  67 y.o. female  PRE-OPERATIVE DIAGNOSIS:  Right knee osteoarthritis  POST-OPERATIVE DIAGNOSIS:  Right knee osteoarthritis  PROCEDURE:  Procedure(s) with comments: TOTAL KNEE ARTHROPLASTY (Right) - 150 min  The aquamantis was utilized for this case to help facilitate better hemostasis as patient was felt to be at increased risk of bleeding because of complex case requiring increased OR time and/or exposure.  SURGEON:  Surgeons and Role:    * Jene Every, MD - Primary  PHYSICIAN ASSISTANT:   ASSISTANTS: Bissell   ANESTHESIA:   spinal  EBL:  300  BLOOD ADMINISTERED:none  DRAINS: none   LOCAL MEDICATIONS USED:  MARCAINE     SPECIMEN:  No Specimen  DISPOSITION OF SPECIMEN:  N/A  COUNTS:  YES  TOURNIQUET:   DICTATION: .Other Dictation: Dictation Number   [id number]  PLAN OF CARE: Admit for overnight observation  PATIENT DISPOSITION:  PACU - hemodynamically stable.   Delay start of Pharmacological VTE agent (>24hrs) due to surgical blood loss or risk of bleeding: no

## 2023-03-21 NOTE — Plan of Care (Signed)
Problem: Education: Goal: Knowledge of General Education information will improve Description: Including pain rating scale, medication(s)/side effects and non-pharmacologic comfort measures Outcome: Progressing   Problem: Health Behavior/Discharge Planning: Goal: Ability to manage health-related needs will improve Outcome: Progressing   Problem: Clinical Measurements: Goal: Ability to maintain clinical measurements within normal limits will improve Outcome: Progressing   Problem: Coping: Goal: Level of anxiety will decrease Outcome: Progressing   Haydee Salter, RN 03/21/23 5:32 PM

## 2023-03-21 NOTE — Anesthesia Postprocedure Evaluation (Signed)
Anesthesia Post Note  Patient: [name]  Procedure(s) Performed: TOTAL KNEE ARTHROPLASTY (Right: Knee)     Patient location during evaluation: PACU Anesthesia Type: Regional, MAC and Spinal Level of consciousness: awake and alert and oriented Pain management: pain level controlled Vital Signs Assessment: post-procedure vital signs reviewed and stable Respiratory status: spontaneous breathing, nonlabored ventilation and respiratory function stable Cardiovascular status: blood pressure returned to baseline and stable Postop Assessment: no headache, no backache, spinal receding and patient able to bend at knees Anesthetic complications: no  No notable events documented.  Last Vitals:  Vitals:   03/21/23 1615 03/21/23 1630  BP: 128/68 (!) 140/95  Pulse: 77 71  Resp: 15 12  Temp:    SpO2: 98% 98%    Last Pain:  Vitals:   03/21/23 1630  TempSrc:   PainSc: 0-No pain                 Lannie Fields

## 2023-03-21 NOTE — Anesthesia Procedure Notes (Signed)
Procedure Name: MAC Date/Time: 03/21/2023 12:42 PM  Performed by: Orest Dikes, CRNAPre-anesthesia Checklist: Patient identified, Emergency Drugs available, Suction available and Patient being monitored Oxygen Delivery Method: Simple face mask

## 2023-03-21 NOTE — Anesthesia Preprocedure Evaluation (Addendum)
Anesthesia Evaluation  Patient identified by MRN, date of birth, ID band Patient awake    Reviewed: Allergy & Precautions, NPO status , Patient's Chart, lab work & pertinent test results  History of Anesthesia Complications (+) Family history of anesthesia reaction and history of anesthetic complications (maternal aunt died during hysterectomy @ 68yo- unknown reason. pt has had anesthesia before uneventfully)  Airway Mallampati: IV  TM Distance: >3 FB Neck ROM: Full    Dental  (+) Teeth Intact, Dental Advisory Given   Pulmonary former smoker   Pulmonary exam normal breath sounds clear to auscultation       Cardiovascular hypertension (145/75 preop, per pt normally 130s SBP), Pt. on medications Normal cardiovascular exam Rhythm:Regular Rate:Normal     Neuro/Psych  PSYCHIATRIC DISORDERS Anxiety Depression    negative neurological ROS     GI/Hepatic Neg liver ROS,GERD  Medicated and Controlled,,  Endo/Other  diabetes, Well Controlled, Type 2  Obesity BMI 34  Renal/GU negative Renal ROS  negative genitourinary   Musculoskeletal  (+) Arthritis , Osteoarthritis,  Fibromyalgia -  Abdominal  (+) + obese  Peds  Hematology negative hematology ROS (+) Hb 14.6, plt 218   Anesthesia Other Findings Mounjaro LD: >7d  Reproductive/Obstetrics negative OB ROS                             Anesthesia Physical Anesthesia Plan  ASA: 3  Anesthesia Plan: MAC, Regional and Spinal   Post-op Pain Management: Tylenol PO (pre-op)*   Induction:   PONV Risk Score and Plan: 2 and Propofol infusion and TIVA  Airway Management Planned: Natural Airway and Simple Face Mask  Additional Equipment: None  Intra-op Plan:   Post-operative Plan:   Informed Consent: I have reviewed the patients History and Physical, chart, labs and discussed the procedure including the risks, benefits and alternatives for the proposed  anesthesia with the patient or authorized representative who has indicated his/her understanding and acceptance.     Dental advisory given  Plan Discussed with: CRNA  Anesthesia Plan Comments:        Anesthesia Quick Evaluation

## 2023-03-21 NOTE — Plan of Care (Signed)
  Problem: Nutrition: Goal: Adequate nutrition will be maintained Outcome: Progressing   Problem: Coping: Goal: Level of anxiety will decrease Outcome: Progressing   

## 2023-03-21 NOTE — Anesthesia Procedure Notes (Signed)
Spinal  Patient location during procedure: OR End time: 03/21/2023 12:49 PM Reason for block: surgical anesthesia Staffing Performed: resident/CRNA  Anesthesiologist: Lannie Fields, DO Resident/CRNA: Orest Dikes, CRNA Performed by: Orest Dikes, CRNA Authorized by: Lannie Fields, DO   Preanesthetic Checklist Completed: patient identified, IV checked, site marked, risks and benefits discussed, surgical consent, monitors and equipment checked, pre-op evaluation and timeout performed Spinal Block Patient position: sitting Prep: DuraPrep Patient monitoring: heart rate, cardiac monitor, continuous pulse ox and blood pressure Approach: midline Location: L3-4 Injection technique: single-shot Needle Needle type: Pencan  Needle gauge: 24 G Needle length: 10 cm Assessment Sensory level: T4 Events: CSF return Additional Notes IV functioning, monitors applied to pt. Expiration date of kit checked and confirmed to be in date. Sterile prep and drape, hand hygiene and sterile gloves used. Pt was positioned and spine was prepped in sterile fashion. Skin was anesthetized with lidocaine. Free flow of clear CSF obtained prior to injecting local anesthetic into CSF x 1 attempt. Spinal needle aspirated freely following injection. Needle was carefully withdrawn, and pt tolerated procedure well. Loss of motor and sensory on exam post injection. Dr Jacquenette Shone at bedside for entire placement.

## 2023-03-22 DIAGNOSIS — M1711 Unilateral primary osteoarthritis, right knee: Secondary | ICD-10-CM | POA: Diagnosis not present

## 2023-03-22 DIAGNOSIS — Z79899 Other long term (current) drug therapy: Secondary | ICD-10-CM | POA: Diagnosis not present

## 2023-03-22 DIAGNOSIS — I10 Essential (primary) hypertension: Secondary | ICD-10-CM | POA: Diagnosis not present

## 2023-03-22 DIAGNOSIS — Z87891 Personal history of nicotine dependence: Secondary | ICD-10-CM | POA: Diagnosis not present

## 2023-03-22 DIAGNOSIS — Z7982 Long term (current) use of aspirin: Secondary | ICD-10-CM | POA: Diagnosis not present

## 2023-03-22 NOTE — Progress Notes (Signed)
Physical Therapy Treatment Patient Details Name: [name] MRN: [id number] DOB: 1955/09/02 Today's Date: 03/22/2023   History of Present Illness 67 yo female presents to therapy s/p R TKA on 03/21/2023 due to failure of conservative measures. Pt PMH includes but is not limited to back pain, fibromyalgia, GERD, HTN, HLD, IBS, and neuromuscular disorder.    PT Comments  Pt received in bed, family at bedside. Pt received IV dilaudid and Zofran prior to session yet continued to c/o 10/10 pain keeping eyes closed throughout session and unable to progress further than transferring bed to chair with RW. Poor tolerance for R knee ROM, - SLR, able to sit upright in chair post session with knee flexed at ~60 degrees. Will attempt to progress in pm.   If plan is discharge home, recommend the following: A lot of help with walking and/or transfers;A little help with bathing/dressing/bathroom;Assistance with cooking/housework;Assist for transportation;Help with stairs or ramp for entrance   Can travel by private vehicle        Equipment Recommendations  Rolling walker (2 wheels)    Recommendations for Other Services       Precautions / Restrictions Precautions Precautions: Knee;Fall Precaution Booklet Issued: Yes (comment) Precaution Comments: Will review in pm session Restrictions Weight Bearing Restrictions: Yes RLE Weight Bearing: Weight bearing as tolerated     Mobility  Bed Mobility Overal bed mobility: Needs Assistance Bed Mobility: Supine to Sit     Supine to sit: Mod assist, HOB elevated, Used rails     General bed mobility comments: Increased assist and time needed    Transfers Overall transfer level: Needs assistance Equipment used: Rolling walker (2 wheels) Transfers: Sit to/from Stand Sit to Stand: From elevated surface, Mod assist           General transfer comment: cues for power up and extension posture with pull to stand from elevated EOB with increased  time, eyes remained closed    Ambulation/Gait               General Gait Details: Pt unable to tolerate due to pain and nausea   Stairs             Wheelchair Mobility     Tilt Bed    Modified Rankin (Stroke Patients Only)       Balance Overall balance assessment: Needs assistance, History of Falls Sitting-balance support: Feet supported Sitting balance-[name] Scale: Good     Standing balance support: Bilateral upper extremity supported, During functional activity, Reliant on assistive device for balance Standing balance-[name] Scale: Poor Standing balance comment: Pt with heavy reliance on RW                            Cognition Arousal: Alert, Lethargic, Suspect due to medications Behavior During Therapy: Flat affect (lethargic) Overall Cognitive Status: Impaired/Different from baseline Area of Impairment: Attention, Safety/judgement                         Safety/Judgement: Decreased awareness of safety     General Comments: Lethargic due to pain meds, eyes closed during 90% of session        Exercises Total Joint Exercises Ankle Circles/Pumps: AROM, Both, 10 reps Quad Sets: AROM, Right, 5 reps, Supine Heel Slides: AAROM, Right, 5 reps Straight Leg Raises: AAROM, Right, 5 reps, Supine    General Comments General comments (skin integrity, edema, etc.): R LE ace wrap intact  Pertinent Vitals/Pain Pain Assessment Pain Assessment: 0-10 Pain Score: 10-Worst pain ever Pain Location: R LE/knee Pain Descriptors / Indicators: Aching, Constant, Discomfort, Grimacing, Operative site guarding Pain Intervention(s): Premedicated before session, Ice applied    Home Living                          Prior Function            PT Goals (current goals can now be found in the care plan section) Acute Rehab PT Goals Patient Stated Goal: to be able to return to water areobics at the [facility]    Frequency     7X/week      PT Plan      Co-evaluation              AM-PAC PT "6 Clicks" Mobility   Outcome Measure  Help needed turning from your back to your side while in a flat bed without using bedrails?: A Little Help needed moving from lying on your back to sitting on the side of a flat bed without using bedrails?: A Little Help needed moving to and from a bed to a chair (including a wheelchair)?: A Little Help needed standing up from a chair using your arms (e.g., wheelchair or bedside chair)?: A Lot Help needed to walk in hospital room?: Total Help needed climbing 3-5 steps with a railing? : Total 6 Click Score: 13    End of Session Equipment Utilized During Treatment: Gait belt Activity Tolerance: Patient limited by pain;Treatment limited secondary to medical complications (Comment) (Nausea and pain) Patient left: with call bell/phone within reach;in bed;with bed alarm set;with family/visitor present Nurse Communication: Mobility status;Other (comment) (Poor tolerance for activity) PT Visit Diagnosis: Unsteadiness on feet (R26.81);Other abnormalities of gait and mobility (R26.89);Muscle weakness (generalized) (M62.81);History of falling (Z91.81);Difficulty in walking, not elsewhere classified (R26.2);Pain Pain - Right/Left: Right Pain - part of body: Leg;Knee     Time: 1010-1042 PT Time Calculation (min) (ACUTE ONLY): 32 min  Charges:    $Therapeutic Exercise: 8-22 mins $Therapeutic Activity: 8-22 mins PT General Charges $$ ACUTE PT VISIT: 1 Visit                    [name], PTA  [name] 03/22/2023, 11:41 AM

## 2023-03-22 NOTE — Progress Notes (Signed)
Physical Therapy Treatment Patient Details Name: SHELENA BETTNER MRN: [id number] DOB: [birth year] Today's Date: 03/22/2023   History of Present Illness 67 yo female presents to therapy s/p R TKA on 03/21/2023 due to failure of conservative measures. Pt PMH includes but is not limited to back pain, fibromyalgia, GERD, HTN, HLD, IBS, and neuromuscular disorder.    PT Comments  Pt tolerated sitting up for ~3 hours, c/c of 10/10 pain despite multiple pain/nausea meds. Pt unable to progress further than a few steps with RW from chair to bed and minimal weight acceptance through R LE. Pt and sister educated on importance of working through pain and progressing functional mobility in order to safely d/c home. TKR handout issued and reviewed briefly with sister. Towel roll placed under distal LE to facilitate extension at knee. Will continue to progress as able.    If plan is discharge home, recommend the following: A lot of help with walking and/or transfers;A little help with bathing/dressing/bathroom;Assistance with cooking/housework;Assist for transportation;Help with stairs or ramp for entrance   Can travel by private vehicle        Equipment Recommendations  Rolling walker (2 wheels)    Recommendations for Other Services       Precautions / Restrictions Precautions Precautions: Knee;Fall Precaution Booklet Issued: Yes (comment) Precaution Comments:  (Pt/sister given TKR handout, breif review with sister due to pt's lethargy) Restrictions Weight Bearing Restrictions: Yes RLE Weight Bearing: Weight bearing as tolerated     Mobility  Bed Mobility Overal bed mobility: Needs Assistance Bed Mobility: Sit to Supine     Supine to sit: Mod assist, HOB elevated, Used rails Sit to supine: Mod assist (for B LE's)   General bed mobility comments: Increased assist and time needed    Transfers Overall transfer level: Needs assistance Equipment used: Rolling walker (2 wheels) Transfers:  Sit to/from Stand Sit to Stand: Min assist, Mod assist           General transfer comment: cues for power up and extension posture with pull to stand from elevated EOB with increased time, eyes remained closed    Ambulation/Gait               General Gait Details: Pt unable to tolerate due to pain and nausea   Stairs             Wheelchair Mobility     Tilt Bed    Modified Rankin (Stroke Patients Only)       Balance Overall balance assessment: Needs assistance, History of Falls Sitting-balance support: Feet supported Sitting balance-[name] Scale: Good     Standing balance support: Bilateral upper extremity supported, During functional activity, Reliant on assistive device for balance Standing balance-[name] Scale: Poor Standing balance comment: Pt with heavy reliance on RW                            Cognition Arousal: Alert, Lethargic, Suspect due to medications Behavior During Therapy: Flat affect (lethargic, eyes closed t/o session) Overall Cognitive Status: Impaired/Different from baseline Area of Impairment: Attention, Safety/judgement                         Safety/Judgement: Decreased awareness of safety     General Comments: Lethargic due to pain meds, eyes closed during 90% of session        Exercises Total Joint Exercises Ankle Circles/Pumps: AROM, Both, 10 reps Quad Sets: AROM, Right,  5 reps, Supine Heel Slides: AAROM, Right, 5 reps Straight Leg Raises: AAROM, Right, 5 reps, Supine    General Comments General comments (skin integrity, edema, etc.): R LE ace wrap intact      Pertinent Vitals/Pain Pain Assessment Pain Assessment: 0-10 Pain Score: 10-Worst pain ever Pain Location: R LE/knee Pain Descriptors / Indicators: Aching, Constant, Discomfort, Grimacing, Operative site guarding Pain Intervention(s): Premedicated before session, Ice applied    Home Living                          Prior  Function            PT Goals (current goals can now be found in the care plan section) Acute Rehab PT Goals Patient Stated Goal: to be able to return to water areobics at the [facility]    Frequency    7X/week      PT Plan      Co-evaluation              AM-PAC PT "6 Clicks" Mobility   Outcome Measure  Help needed turning from your back to your side while in a flat bed without using bedrails?: A Little Help needed moving from lying on your back to sitting on the side of a flat bed without using bedrails?: A Little Help needed moving to and from a bed to a chair (including a wheelchair)?: A Little Help needed standing up from a chair using your arms (e.g., wheelchair or bedside chair)?: A Lot Help needed to walk in hospital room?: Total Help needed climbing 3-5 steps with a railing? : Total 6 Click Score: 13    End of Session Equipment Utilized During Treatment: Gait belt Activity Tolerance: Patient limited by pain;Treatment limited secondary to medical complications (Comment) Patient left: in bed;with call bell/phone within reach;with bed alarm set;with family/visitor present Nurse Communication: Mobility status;Other (comment) PT Visit Diagnosis: Unsteadiness on feet (R26.81);Other abnormalities of gait and mobility (R26.89);Muscle weakness (generalized) (M62.81);History of falling (Z91.81);Difficulty in walking, not elsewhere classified (R26.2);Pain Pain - Right/Left: Right Pain - part of body: Leg;Knee     Time: 1340-1400 PT Time Calculation (min) (ACUTE ONLY): 20 min  Charges:    $Therapeutic Exercise: 8-22 mins $Therapeutic Activity: 8-22 mins PT General Charges $$ ACUTE PT VISIT: 1 Visit                    Zadie Cleverly, PTA  [name] 03/22/2023, 2:14 PM

## 2023-03-22 NOTE — Plan of Care (Signed)
  Problem: Activity: Goal: Risk for activity intolerance will decrease Outcome: Progressing   Problem: Nutrition: Goal: Adequate nutrition will be maintained Outcome: Progressing   Problem: Pain Managment: Goal: General experience of comfort will improve Outcome: Progressing   

## 2023-03-22 NOTE — TOC Initial Note (Addendum)
Transition of Care [facility]) - Initial/Assessment Note    Patient Details  Name: [name] MRN: [id number] Date of Birth: 06-27-1955  [facility]) CM/SW Contact:    Otelia Santee, LCSW Phone Number: 03/22/2023, 1:42 PM  Clinical Narrative:                 Met with pt and sister in room to confirm discharge plan to return home w/ HHPT through [facility]. Pt shares she is not aware of this plan. CSW discussed having this plan arranged prior to her pre-arranged surgery. Pt denies this. Pt/sister share that they would like to see if she is a candidate for SNF placement. CSW will follow up with pt's primary team to discuss.  Pt shares she has a standard walker at home that she received 1 year ago. Pt requesting a RW. CSW shared that this would have to be private pay as pt's insurance will only cover 1 assistive device in a 5 year period. Pt is open to private paying for RW depending on the amount it costs.   Expected Discharge Plan: Home w Home Health Services Barriers to Discharge: No Barriers Identified   Patient Goals and CMS Choice Patient states their goals for this hospitalization and ongoing recovery are:: To get rehab          Expected Discharge Plan and Services In-house Referral: Clinical Social Work Discharge Planning Services: NA Post Acute Care Choice: Skilled Nursing Facility, Durable Medical Equipment Living arrangements for the past 2 months: Single Family Home                                      Prior Living Arrangements/Services Living arrangements for the past 2 months: Single Family Home Lives with:: Self Patient language and need for interpreter reviewed:: Yes Do you feel safe going back to the place where you live?: Yes      Need for Family Participation in Patient Care: No (Comment) Care giver support system in place?: No (comment) Current home services: DME (Standard walker) Criminal Activity/Legal Involvement Pertinent to  Current Situation/Hospitalization: No - Comment as needed  Activities of Daily Living   ADL Screening (condition at time of admission) Independently performs ADLs?: Yes (appropriate for developmental age) Is the patient deaf or have difficulty hearing?: No Does the patient have difficulty seeing, even when wearing glasses/contacts?: No Does the patient have difficulty concentrating, remembering, or making decisions?: No  Permission Sought/Granted Permission sought to share information with : Family Supports, Oceanographer granted to share information with : Yes, Verbal Permission Granted  Share Information with NAME: Marcie Bal     Permission granted to share info w Relationship: Sister  Permission granted to share info w Contact Information: [phone]  Emotional Assessment Appearance:: Appears older than stated age Attitude/Demeanor/Rapport: Lethargic Affect (typically observed): Pleasant Orientation: : Oriented to Self, Oriented to Place, Oriented to  Time, Oriented to Situation Alcohol / Substance Use: Not Applicable Psych Involvement: No (comment)  Admission diagnosis:  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] Patient Active Problem List   Diagnosis Date Noted   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   COVID-19 09/04/2022   Bilateral impacted cerumen 04/29/2022   Anxiety and depression 11/05/2021   Preventative health care 05/15/2021   Stress incontinence 08/23/2020   Vitamin D deficiency disease 10/13/2018   Balding 10/13/2018   DDD (degenerative disc disease), lumbar 10/13/2018  IBS (irritable bowel syndrome) 02/24/2017   Constipation 10/16/2015   Obesity, Class II, BMI 35-39.9, with comorbidity 01/28/2013   DJD (degenerative joint disease) of knee 11/08/2011   Insomnia secondary to chronic pain 09/09/2011   BACK PAIN, LUMBAR, WITH RADICULOPATHY 08/09/2009   HYPERTENSION, BENIGN ESSENTIAL 08/03/2009   NASH (nonalcoholic steatohepatitis) 10/25/2008    Fibromyalgia muscle pain 10/25/2008   Controlled type 2 diabetes mellitus with microalbuminuria, without long-term current use of insulin (HCC) 05/25/2008   Hyperlipidemia with target LDL less than 100 05/25/2008   GERD 05/25/2008   PCP:  Sandford Craze, NP Pharmacy:   [id number] - [place], [place] - [address] [place]  [address] Phone: [phone] Fax: [phone]  [facility] [place] [facility] [address] [place] [address] Phone: [phone] Fax: [phone]     Social Determinants of Health (SDOH) Social History: SDOH Screenings   Food Insecurity: No Food Insecurity (03/21/2023)  Housing: Low Risk  (03/21/2023)  Transportation Needs: No Transportation Needs (03/21/2023)  Utilities: Not At Risk (03/21/2023)  Alcohol Screen: Low Risk  (09/11/2022)  Depression (PHQ2-9): Low Risk  (09/11/2022)  Financial Resource Strain: Medium Risk (12/24/2022)  Physical Activity: Insufficiently Active (09/11/2022)  Social Connections: Moderately Isolated (12/24/2022)  Stress: No Stress Concern Present (09/11/2022)  Tobacco Use: Medium Risk (03/21/2023)   SDOH Interventions:     Readmission Risk Interventions     No data to display

## 2023-03-22 NOTE — Progress Notes (Signed)
    Subjective: Patient seen in rounds for Dr. Shelle Iron.  Patient reports pain as mild to moderate.  Currently having nausea, nurse notified for zofran. Denies V/CP/SOB/Abd pain.  She is having some difficulty with pain control.  Family at bedside.   Objective:   VITALS:   Vitals:   03/21/23 2051 03/21/23 2352 03/22/23 0437 03/22/23 1024  BP: 113/65 119/61 121/68 132/63  Pulse: (!) 59 82 64 (!) 58  Resp: 18 18 18 16   Temp: 97.8 F (36.6 C) 97.9 F (36.6 C) 98 F (36.7 C) 97.8 F (36.6 C)  TempSrc: Oral Oral Oral   SpO2: 96% 96% 96% 93%  Weight:      Height:        Patient lying in bed. NAD.  Neurologically intact ABD soft Neurovascular intact Sensation intact distally Intact pulses distally Dorsiflexion/Plantar flexion intact Incision: dressing C/D/I No cellulitis present Compartment soft   Lab Results  Component Value Date   WBC 4.5 03/12/2023   HGB 14.6 03/12/2023   HCT 40.1 03/12/2023   MCV 84.8 03/12/2023   PLT 218 03/12/2023   BMET    Component Value Date/Time   NA 136 03/12/2023 0848   NA 139 12/09/2016 1145   K 3.5 03/12/2023 0848   CL 98 03/12/2023 0848   CO2 27 03/12/2023 0848   GLUCOSE 121 (H) 03/12/2023 0848   BUN 16 03/12/2023 0848   BUN 11 12/09/2016 1145   CREATININE 0.71 03/12/2023 0848   CREATININE 0.76 07/26/2015 1500   CALCIUM 10.0 03/12/2023 0848   GFRNONAA >60 03/12/2023 0848     Assessment/Plan: 1 Day Post-Op   Principal Problem:   Right knee DJD    WBAT with walker DVT ppx: Aspirin, SCDs, TEDS PO pain control PT/OT: Patient had some nausea yesterday limiting PT. Continue PT today.  Dispo:  D/c pending pain control, nausea control,and PT clearance.    Renee [place],PA-C 03/22/2023, 10:45 AM   [facility]  [place] [address], [address], [place], [place] [address] Phone: [phone] [email or web address] Facebook  [employer]

## 2023-03-23 DIAGNOSIS — M1711 Unilateral primary osteoarthritis, right knee: Secondary | ICD-10-CM | POA: Diagnosis not present

## 2023-03-23 DIAGNOSIS — Z87891 Personal history of nicotine dependence: Secondary | ICD-10-CM | POA: Diagnosis not present

## 2023-03-23 DIAGNOSIS — Z79899 Other long term (current) drug therapy: Secondary | ICD-10-CM | POA: Diagnosis not present

## 2023-03-23 DIAGNOSIS — I10 Essential (primary) hypertension: Secondary | ICD-10-CM | POA: Diagnosis not present

## 2023-03-23 DIAGNOSIS — Z7982 Long term (current) use of aspirin: Secondary | ICD-10-CM | POA: Diagnosis not present

## 2023-03-23 LAB — BASIC METABOLIC PANEL
Anion gap: 9 (ref 5–15)
BUN: 9 mg/dL (ref 8–23)
CO2: 29 mmol/L (ref 22–32)
Calcium: 8.7 mg/dL — ABNORMAL LOW (ref 8.9–10.3)
Chloride: 91 mmol/L — ABNORMAL LOW (ref 98–111)
Creatinine, Ser: 0.64 mg/dL (ref 0.44–1.00)
GFR, Estimated: >60 mL/min (ref >60)
Glucose, Bld: 183 mg/dL — ABNORMAL HIGH (ref 70–99)
Potassium: 3.4 mmol/L — ABNORMAL LOW (ref 3.5–5.1)
Sodium: 129 mmol/L — ABNORMAL LOW (ref 135–145)

## 2023-03-23 LAB — CBC WITH DIFFERENTIAL/PLATELET
Abs Immature Granulocytes: 0.05 10*3/uL (ref 0.00–0.07)
Basophils Absolute: 0 10*3/uL (ref 0.0–0.1)
Basophils Relative: 0 %
Eosinophils Absolute: 0 10*3/uL (ref 0.0–0.5)
Eosinophils Relative: 0 %
HCT: 30.7 % — ABNORMAL LOW (ref 36.0–46.0)
Hemoglobin: 11.4 g/dL — ABNORMAL LOW (ref 12.0–15.0)
Immature Granulocytes: 1 %
Lymphocytes Relative: 22 %
Lymphs Abs: 2.3 10*3/uL (ref 0.7–4.0)
MCH: 30.8 pg (ref 26.0–34.0)
MCHC: 37.1 g/dL — ABNORMAL HIGH (ref 30.0–36.0)
MCV: 83 fL (ref 80.0–100.0)
Monocytes Absolute: 1.7 10*3/uL — ABNORMAL HIGH (ref 0.1–1.0)
Monocytes Relative: 16 %
Neutro Abs: 6.6 10*3/uL (ref 1.7–7.7)
Neutrophils Relative %: 61 %
Platelets: 208 10*3/uL (ref 150–400)
RBC: 3.7 MIL/uL — ABNORMAL LOW (ref 3.87–5.11)
RDW: 14.6 % (ref 11.5–15.5)
WBC: 10.6 10*3/uL — ABNORMAL HIGH (ref 4.0–10.5)
nRBC: 0 % (ref 0.0–0.2)

## 2023-03-23 LAB — GLUCOSE, CAPILLARY
Glucose-Capillary: 198 mg/dL — ABNORMAL HIGH (ref 70–99)
Glucose-Capillary: 170 mg/dL — ABNORMAL HIGH (ref 70–99)

## 2023-03-23 MED ORDER — KETOROLAC TROMETHAMINE 10 MG PO TABS
10.0000 mg | ORAL_TABLET | Freq: Three times a day (TID) | ORAL | Status: DC | PRN
  Filled 2023-03-23: qty 1

## 2023-03-23 NOTE — Plan of Care (Signed)
°  Problem: Clinical Measurements: °Goal: Ability to maintain clinical measurements within normal limits will improve °Outcome: Progressing °  °Problem: Activity: °Goal: Risk for activity intolerance will decrease °Outcome: Progressing °  °Problem: Nutrition: °Goal: Adequate nutrition will be maintained °Outcome: Progressing °  °

## 2023-03-23 NOTE — TOC Progression Note (Addendum)
Transition of Care [facility]) - Progression Note    Patient Details  Name: AHSHA HARAN MRN: [id number] Date of Birth: [birth year]  Transition of Care [facility]) CM/SW Contact  Darleene Cleaver, [place] Phone Number: 03/23/2023, 6:01 PM  Clinical Narrative:     CSW was informed that patient would like SNF for short term rehab if she is eligible.  CSW sent information to SNFs awaiting bed offers.  Once patient chooses a facility, insurance auth will have to be started.  CSW updated patient about process, she expressed understanding.  Patient asked how many HH visits she would receive, CSW informed her it would depend on the insurance and what she qualifies for.  Patient asked if inpatient rehab would be an option, CSW informed not for knee replacements, insurance would not approve.  TOC to follow up with patient tomorrow regarding bed offers.   Expected Discharge Plan: Home w Home Health Services Barriers to Discharge: No Barriers Identified  Expected Discharge Plan and Services In-house Referral: Clinical Social Work Discharge Planning Services: NA Post Acute Care Choice: Skilled Nursing Facility, Durable Medical Equipment Living arrangements for the past 2 months: Single Family Home                                       Social Determinants of Health (SDOH) Interventions SDOH Screenings   Food Insecurity: No Food Insecurity (03/21/2023)  Housing: Low Risk  (03/21/2023)  Transportation Needs: No Transportation Needs (03/21/2023)  Utilities: Not At Risk (03/21/2023)  Alcohol Screen: Low Risk  (09/11/2022)  Depression (PHQ2-9): Low Risk  (09/11/2022)  Financial Resource Strain: Medium Risk (12/24/2022)  Physical Activity: Insufficiently Active (09/11/2022)  Social Connections: Moderately Isolated (12/24/2022)  Stress: No Stress Concern Present (09/11/2022)  Tobacco Use: Medium Risk (03/21/2023)    Readmission Risk Interventions     No data to display

## 2023-03-23 NOTE — Care Management Obs Status (Signed)
MEDICARE OBSERVATION STATUS NOTIFICATION   Patient Details  Name: [name] MRN: [id number] Date of Birth: 02-05-1956   Medicare Observation Status Notification Given:  Yes    Halford Chessman 03/23/2023, 6:11 PM

## 2023-03-23 NOTE — Progress Notes (Signed)
Physical Therapy Treatment Patient Details Name: [name] MRN: [id number] DOB: [birth year] Today's Date: 03/23/2023   History of Present Illness 67 yo female presents to therapy s/p R TKA on 03/21/2023 due to failure of conservative measures. Pt PMH includes but is not limited to back pain, fibromyalgia, GERD, HTN, HLD, IBS, and neuromuscular disorder.    PT Comments  Ptcontinues very cooperative and progressing slowly but steadily with mobility - continues to fatigue easily and pain limited.  Patient will benefit from continued inpatient follow up therapy, <3 hours/day to maximize IND and safety prior to return home with limited assist.   If plan is discharge home, recommend the following: A lot of help with walking and/or transfers;A little help with bathing/dressing/bathroom;Assistance with cooking/housework;Assist for transportation;Help with stairs or ramp for entrance   Can travel by private vehicle     No  Equipment Recommendations  Rolling walker (2 wheels)    Recommendations for Other Services       Precautions / Restrictions Precautions Precautions: Knee;Fall Restrictions Weight Bearing Restrictions: No RLE Weight Bearing: Weight bearing as tolerated     Mobility  Bed Mobility Overal bed mobility: Needs Assistance Bed Mobility: Sit to Supine       Sit to supine: Min assist, +2 for safety/equipment   General bed mobility comments: Increased time with cues for sequence and use of L LE to self assist    Transfers Overall transfer level: Needs assistance Equipment used: Rolling walker (2 wheels) Transfers: Sit to/from Stand, Bed to chair/wheelchair/BSC Sit to Stand: Min assist, Mod assist   Step pivot transfers: Min assist, Mod assist       General transfer comment: cues for LE managment and use of UEs to self assist; step pvt recliner to [facility] to bedside    Ambulation/Gait Ambulation/Gait assistance: Min assist, +2 safety/equipment (chair  follow) Gait Distance (Feet): 26 Feet Assistive device: Rolling walker (2 wheels) Gait Pattern/deviations: Step-to pattern, Decreased step length - left, Shuffle, Trunk flexed Gait velocity: decr     General Gait Details: increased time with cues for posture, sequence and position from RW; distance ltd by pain/fatigue   Stairs             Wheelchair Mobility     Tilt Bed    Modified Rankin (Stroke Patients Only)       Balance Overall balance assessment: Needs assistance, History of Falls Sitting-balance support: Feet supported Sitting balance-[name] Scale: Good     Standing balance support: Bilateral upper extremity supported, During functional activity, Reliant on assistive device for balance Standing balance-[name] Scale: Poor Standing balance comment: Pt with heavy reliance on RW                            Cognition Arousal: Alert, Lethargic, Suspect due to medications (rousable) Behavior During Therapy: Flat affect Overall Cognitive Status: Within Functional Limits for tasks assessed                                          Exercises      General Comments        Pertinent Vitals/Pain Pain Assessment Pain Assessment: 0-10 Pain Score: 7  Pain Location: R LE/knee Pain Descriptors / Indicators: Aching, Constant, Discomfort, Grimacing, Operative site guarding Pain Intervention(s): Limited activity within patient's tolerance, Monitored during session, Premedicated before session, Ice applied  Home Living                          Prior Function            PT Goals (current goals can now be found in the care plan section) Acute Rehab PT Goals Patient Stated Goal: to be able to return to [facility] areobics at the [facility] PT Goal Formulation: With patient Time For Goal Achievement: 04/11/23 Potential to Achieve Goals: Good Progress towards PT goals: Progressing toward goals    Frequency    7X/week      PT  Plan      Co-evaluation              AM-PAC PT "6 Clicks" Mobility   Outcome Measure  Help needed turning from your back to your side while in a flat bed without using bedrails?: A Little Help needed moving from lying on your back to sitting on the side of a flat bed without using bedrails?: A Little Help needed moving to and from a bed to a chair (including a wheelchair)?: A Lot Help needed standing up from a chair using your arms (e.g., wheelchair or bedside chair)?: A Lot Help needed to walk in hospital room?: A Little Help needed climbing 3-5 steps with a railing? : Total 6 Click Score: 14    End of Session Equipment Utilized During Treatment: Gait belt Activity Tolerance: Patient limited by fatigue;Patient limited by pain Patient left: in bed;with call bell/phone within reach;with bed alarm set;with family/visitor present Nurse Communication: Mobility status PT Visit Diagnosis: Unsteadiness on feet (R26.81);Other abnormalities of gait and mobility (R26.89);Muscle weakness (generalized) (M62.81);History of falling (Z91.81);Difficulty in walking, not elsewhere classified (R26.2);Pain Pain - Right/Left: Right Pain - part of body: Leg;Knee     Time: 5409-8119 PT Time Calculation (min) (ACUTE ONLY): 31 min  Charges:    $Gait Training: 8-22 mins $Therapeutic Activity: 8-22 mins PT General Charges $$ ACUTE PT VISIT: 1 Visit                     Mauro Kaufmann PT Acute Rehabilitation Services Pager [phone] Office [phone]    [name] 03/23/2023, 3:01 PM

## 2023-03-23 NOTE — Progress Notes (Signed)
PT somnolent during 1000am med pass this morning. Pain was verbalized as being a 6/10 but 15mg  of oxycodone was requested so that patient could participate in therapy. PT was easily arousable by voice but was unable to maintain prolonged conversation or finish entirety of breakfast without falling asleep. Nurse was notified by other staff members less than an hour later that patient was reporting 9/10 pain. PT was able to hold consistent conversation but still appeared to be groggy, and ambulation was poor at this time. Provider notified and additional non-sedating medication ordered for pain. PT changed to carb modified diet and ACH&S blood sugars ordered. Will continue to assess.

## 2023-03-23 NOTE — [place] FL2 (Signed)
[place] MEDICAID FL2 LEVEL OF CARE FORM     IDENTIFICATION  Patient Name: [name] Birthdate: 22-Nov-1955 Sex: female Admission Date (Current Location): 03/21/2023  [facility] and [place] Number:  Producer, television/film/video and Address:  [facility],  [address] 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   COVID-19 09/04/2022   Bilateral impacted cerumen 04/29/2022   Anxiety and depression 11/05/2021   Preventative health care 05/15/2021   Stress incontinence 08/23/2020   Vitamin D deficiency disease 10/13/2018   [place] 10/13/2018   DDD (degenerative disc disease), lumbar 10/13/2018   IBS (irritable bowel syndrome) 02/24/2017   Constipation 10/16/2015   Obesity, Class II, BMI 35-39.9, with comorbidity 01/28/2013   DJD (degenerative joint disease) of knee 11/08/2011   Insomnia secondary to chronic pain 09/09/2011   BACK PAIN, LUMBAR, WITH RADICULOPATHY 08/09/2009   HYPERTENSION, BENIGN ESSENTIAL 08/03/2009   NASH (nonalcoholic steatohepatitis) 10/25/2008   Fibromyalgia muscle pain 10/25/2008   Controlled type 2 diabetes mellitus with microalbuminuria, without long-term current use of insulin (HCC) 05/25/2008   Hyperlipidemia with target LDL less than 100 05/25/2008   GERD 05/25/2008    Orientation RESPIRATION BLADDER Height & Weight     Time, Situation, Place, Self  Normal Continent Weight: 221 lb (100.2 kg) Height:  5\' 8"  (172.7 cm)  BEHAVIORAL  SYMPTOMS/MOOD NEUROLOGICAL BOWEL NUTRITION STATUS      Continent Diet  AMBULATORY STATUS COMMUNICATION OF NEEDS Skin   Limited Assist Verbally Surgical wounds                       Personal Care Assistance Level of Assistance  Bathing, Feeding, Dressing Bathing Assistance: Limited assistance Feeding assistance: Independent Dressing Assistance: Limited assistance     Functional Limitations Info  Sight, Hearing, Speech Sight Info: Adequate Hearing Info: Adequate Speech Info: Adequate    SPECIAL CARE FACTORS FREQUENCY  PT (By licensed PT), OT (By licensed OT)     PT Frequency: Minimum 5x a week OT Frequency: Minimum 5x a week            Contractures Contractures Info: Not present    Additional Factors Info  Code Status, Allergies Code Status Info: Full Code Allergies Info: Metformin And Related  Rosuvastatin           Current Medications (03/23/2023):  This is the current hospital active medication list Current Facility-Administered Medications  Medication Dose Route Frequency Provider Last Rate Last Admin   acetaminophen (TYLENOL) tablet 325-650 mg  325-650 mg Oral Q6H PRN Jene Every, MD   650 mg at 03/23/23 0952   alum & mag hydroxide-simeth (MAALOX/MYLANTA) 200-200-20 MG/5ML suspension 30 mL  30 mL Oral Q4H PRN Jene Every, MD       ascorbic acid (VITAMIN C) tablet 500 mg  500 mg Oral Daily Jene Every, MD   500 mg at 03/23/23 5427   aspirin chewable tablet 81 mg  81 mg Oral BID Jene Every, MD  81 mg at 03/23/23 1610   bisacodyl (DULCOLAX) EC tablet 5 mg  5 mg Oral Daily PRN Jene Every, MD       cholecalciferol (VITAMIN D3) 25 MCG (1000 UNIT) tablet 1,000 Units  1,000 Units Oral Daily Jene Every, MD   1,000 Units at 03/23/23 9604   diphenhydrAMINE (BENADRYL) 12.5 MG/5ML elixir 12.5-25 mg  12.5-25 mg Oral Q4H PRN Jene Every, MD       docusate sodium (COLACE) capsule 100 mg  100 mg Oral BID Jene Every, MD   100 mg at 03/23/23 5409    hydrochlorothiazide (HYDRODIURIL) tablet 25 mg  25 mg Oral Daily Jene Every, MD   25 mg at 03/23/23 8119   HYDROmorphone (DILAUDID) injection 0.5-1 mg  0.5-1 mg Intravenous Q4H PRN Jene Every, MD   1 mg at 03/22/23 1478   ketorolac (TORADOL) tablet 10 mg  10 mg Oral Q8H PRN Jene Every, MD       linaclotide Karlene Einstein) capsule 290 mcg  290 mcg Oral Daily PRN Jene Every, MD       loratadine (CLARITIN) tablet 10 mg  10 mg Oral Daily Jene Every, MD   10 mg at 03/23/23 2956   magnesium citrate solution 1 Bottle  1 Bottle Oral Once PRN Jene Every, MD       menthol-cetylpyridinium (CEPACOL) lozenge 3 mg  1 lozenge Oral PRN Jene Every, MD       Or   phenol (CHLORASEPTIC) mouth spray 1 spray  1 spray Mouth/Throat PRN Jene Every, MD       methocarbamol (ROBAXIN) tablet 500 mg  500 mg Oral Q6H PRN Jene Every, MD   500 mg at 03/23/23 1112   Or   methocarbamol (ROBAXIN) injection 500 mg  500 mg Intravenous Q6H PRN Jene Every, MD       metoCLOPramide (REGLAN) tablet 5-10 mg  5-10 mg Oral Q8H PRN Jene Every, MD       Or   metoCLOPramide (REGLAN) injection 5-10 mg  5-10 mg Intravenous Q8H PRN Jene Every, MD   10 mg at 03/22/23 1253   ondansetron (ZOFRAN) tablet 4 mg  4 mg Oral Q6H PRN Jene Every, MD       Or   ondansetron (ZOFRAN) injection 4 mg  4 mg Intravenous Q6H PRN Jene Every, MD   4 mg at 03/22/23 1008   oxyCODONE (Oxy IR/ROXICODONE) immediate release tablet 10-15 mg  10-15 mg Oral Q4H PRN Jene Every, MD   15 mg at 03/23/23 2130   oxyCODONE (Oxy IR/ROXICODONE) immediate release tablet 5-10 mg  5-10 mg Oral Q4H PRN Jene Every, MD   10 mg at 03/22/23 0217   pantoprazole (PROTONIX) EC tablet 40 mg  40 mg Oral Daily Jene Every, MD   40 mg at 03/23/23 8657   polyethylene glycol (MIRALAX / GLYCOLAX) packet 17 g  17 g Oral Daily Jene Every, MD   17 g at 03/23/23 8469   saccharomyces boulardii (FLORASTOR) capsule 250 mg  250 mg Oral Daily  Jene Every, MD   250 mg at 03/23/23 0951   timolol (TIMOPTIC) 0.5 % ophthalmic solution 1 drop  1 drop Both Eyes BID Jene Every, MD   1 drop at 03/23/23 0953   triamcinolone cream (KENALOG) 0.1 % cream   Topical BID PRN Jene Every, MD         Discharge Medications: Please see discharge summary for a list of discharge medications.  Relevant Imaging Results:  Relevant Lab Results:  Additional Information SSN [id number]  Darleene Cleaver, LCSW

## 2023-03-23 NOTE — Plan of Care (Signed)
  Problem: Education: Goal: Knowledge of General Education information will improve Description: Including pain rating scale, medication(s)/side effects and non-pharmacologic comfort measures Outcome: Adequate for Discharge   Problem: Health Behavior/Discharge Planning: Goal: Ability to manage health-related needs will improve Outcome: Adequate for Discharge   Problem: Clinical Measurements: Goal: Ability to maintain clinical measurements within normal limits will improve Outcome: Progressing Goal: Will remain free from infection Outcome: Progressing Goal: Diagnostic test results will improve Outcome: Progressing Goal: Respiratory complications will improve Outcome: Progressing Goal: Cardiovascular complication will be avoided Outcome: Progressing   Problem: Activity: Goal: Risk for activity intolerance will decrease Outcome: Adequate for Discharge   Problem: Nutrition: Goal: Adequate nutrition will be maintained Outcome: Completed/Met   Problem: Coping: Goal: Level of anxiety will decrease Outcome: Progressing   Problem: Elimination: Goal: Will not experience complications related to bowel motility Outcome: Progressing Goal: Will not experience complications related to urinary retention Outcome: Completed/Met   Problem: Pain Managment: Goal: General experience of comfort will improve Outcome: Progressing   Problem: Safety: Goal: Ability to remain free from injury will improve Outcome: Progressing   Problem: Skin Integrity: Goal: Risk for impaired skin integrity will decrease Outcome: Adequate for Discharge   Problem: Education: Goal: Knowledge of the prescribed therapeutic regimen will improve Outcome: Adequate for Discharge Goal: Individualized Educational Video(s) Outcome: Completed/Met   Problem: Activity: Goal: Ability to avoid complications of mobility impairment will improve Outcome: Adequate for Discharge Goal: Range of joint motion will  improve Outcome: Adequate for Discharge   Problem: Clinical Measurements: Goal: Postoperative complications will be avoided or minimized Outcome: Progressing   Problem: Pain Management: Goal: Pain level will decrease with appropriate interventions Outcome: Progressing   Problem: Skin Integrity: Goal: Will show signs of wound healing Outcome: Progressing

## 2023-03-23 NOTE — Progress Notes (Signed)
Subjective: 2 Days Post-Op Procedure(s) (LRB): TOTAL KNEE ARTHROPLASTY (Right) Patient reports pain as 4 on 0-10 scale.   Denies CP or SOB.  Voiding without difficulty. Positive flatus. Objective: Vital signs in last 24 hours: Temp:  [97.8 F (36.6 C)-98.7 F (37.1 C)] 98.7 F (37.1 C) (10/19 2005) Pulse Rate:  [58-65] 65 (10/19 2005) Resp:  [16-18] 18 (10/19 2005) BP: (132-150)/(63-74) 150/74 (10/19 2005) SpO2:  [93 %-98 %] 95 % (10/19 2005)  Intake/Output from previous day: 10/19 0701 - 10/20 0700 In: 360 [P.O.:360] Out: 1150 [Urine:1150] Intake/Output this shift: No intake/output data recorded.  No results for input(s): "HGB" in the last 72 hours. No results for input(s): "WBC", "RBC", "HCT", "PLT" in the last 72 hours. No results for input(s): "NA", "K", "CL", "CO2", "BUN", "CREATININE", "GLUCOSE", "CALCIUM" in the last 72 hours. No results for input(s): "LABPT", "INR" in the last 72 hours.  Neurologically intact Neurovascular intact Intact pulses distally Dorsiflexion/Plantar flexion intact No cellulitis present Compartment soft  Assessment/Plan:  2 Days Post-Op Procedure(s) (LRB): TOTAL KNEE ARTHROPLASTY (Right) Advance diet Up with therapy May need rehab or home health aid since lives alone   Principal Problem:   Right knee DJD      Javier Docker 03/23/2023, @NOW 

## 2023-03-23 NOTE — Progress Notes (Signed)
Physical Therapy Treatment Patient Details Name: [name] MRN: [id number] DOB: Sep 04, 1955 Today's Date: 03/23/2023   History of Present Illness 67 yo female presents to therapy s/p R TKA on 03/21/2023 due to failure of conservative measures. Pt PMH includes but is not limited to back pain, fibromyalgia, GERD, HTN, HLD, IBS, and neuromuscular disorder.    PT Comments  Pt cooperative but with flat affect and limited by c/o fatigue and pain.  Pt received on BSC and up to ambulate limited distance with recliner follow.  Pt up in recliner on arrival of bfast and PT will follow up for therex program later this am.    If plan is discharge home, recommend the following: A lot of help with walking and/or transfers;A little help with bathing/dressing/bathroom;Assistance with cooking/housework;Assist for transportation;Help with stairs or ramp for entrance   Can travel by private vehicle        Equipment Recommendations  Rolling walker (2 wheels)    Recommendations for Other Services       Precautions / Restrictions Precautions Precautions: Knee;Fall Restrictions Weight Bearing Restrictions: No RLE Weight Bearing: Weight bearing as tolerated     Mobility  Bed Mobility               General bed mobility comments: Received on BSC and in recliner for bfast at session end    Transfers Overall transfer level: Needs assistance Equipment used: Rolling walker (2 wheels) Transfers: Sit to/from Stand Sit to Stand: Min assist, Mod assist           General transfer comment: cues for LE managment and use of UEs to self assist    Ambulation/Gait Ambulation/Gait assistance: Min assist Gait Distance (Feet): 8 Feet Assistive device: Rolling walker (2 wheels) Gait Pattern/deviations: Step-to pattern, Decreased step length - left, Shuffle, Trunk flexed Gait velocity: decr     General Gait Details: increased time with cues for posture, sequence and position from RW; distance  ltd by pain/fatigue   Stairs             Wheelchair Mobility     Tilt Bed    Modified Rankin (Stroke Patients Only)       Balance Overall balance assessment: Needs assistance, History of Falls Sitting-balance support: Feet supported Sitting balance-[name] Scale: Good     Standing balance support: Bilateral upper extremity supported, During functional activity, Reliant on assistive device for balance Standing balance-[name] Scale: Poor Standing balance comment: Pt with heavy reliance on RW                            Cognition Arousal: Alert, Lethargic, Suspect due to medications ([name]) Behavior During Therapy: Flat affect                                            Exercises      General Comments        Pertinent Vitals/Pain Pain Assessment Pain Assessment: 0-10 Pain Score: 7  Pain Location: R LE/knee Pain Descriptors / Indicators: Aching, Constant, Discomfort, Grimacing, Operative site guarding Pain Intervention(s): Limited activity within patient's tolerance, Monitored during session, Premedicated before session, Ice applied    Home Living                          Prior Function  PT Goals (current goals can now be found in the care plan section) Acute Rehab PT Goals Patient Stated Goal: to be able to return to water areobics at the [facility] PT Goal Formulation: With patient Time For Goal Achievement: 04/11/23 Potential to Achieve Goals: Good Progress towards PT goals: Progressing toward goals    Frequency    7X/week      PT Plan      Co-evaluation              AM-PAC PT "6 Clicks" Mobility   Outcome Measure  Help needed turning from your back to your side while in a flat bed without using bedrails?: A Little Help needed moving from lying on your back to sitting on the side of a flat bed without using bedrails?: A Little Help needed moving to and from a bed to a chair (including a  wheelchair)?: A Little Help needed standing up from a chair using your arms (e.g., wheelchair or bedside chair)?: A Lot Help needed to walk in hospital room?: Total Help needed climbing 3-5 steps with a railing? : Total 6 Click Score: 13    End of Session Equipment Utilized During Treatment: Gait belt Activity Tolerance: Patient limited by fatigue;Patient limited by pain Patient left: in chair;with call bell/phone within reach;with chair alarm set;with family/visitor present;with nursing/sitter in room Nurse Communication: Mobility status PT Visit Diagnosis: Unsteadiness on feet (R26.81);Other abnormalities of gait and mobility (R26.89);Muscle weakness (generalized) (M62.81);History of falling (Z91.81);Difficulty in walking, not elsewhere classified (R26.2);Pain Pain - Right/Left: Right Pain - part of body: Leg;Knee     Time: 3875-6433 PT Time Calculation (min) (ACUTE ONLY): 17 min  Charges:    $Gait Training: 8-22 mins PT General Charges $$ ACUTE PT VISIT: 1 Visit                     Mauro Kaufmann PT Acute Rehabilitation Services Pager [phone] Office [phone]    [name] 03/23/2023, 9:19 AM

## 2023-03-24 ENCOUNTER — Encounter (HOSPITAL_COMMUNITY): Payer: Self-pay | Admitting: Specialist

## 2023-03-24 DIAGNOSIS — M1711 Unilateral primary osteoarthritis, right knee: Secondary | ICD-10-CM | POA: Diagnosis not present

## 2023-03-24 DIAGNOSIS — Z87891 Personal history of nicotine dependence: Secondary | ICD-10-CM | POA: Diagnosis not present

## 2023-03-24 DIAGNOSIS — I10 Essential (primary) hypertension: Secondary | ICD-10-CM | POA: Diagnosis not present

## 2023-03-24 DIAGNOSIS — Z79899 Other long term (current) drug therapy: Secondary | ICD-10-CM | POA: Diagnosis not present

## 2023-03-24 DIAGNOSIS — Z7982 Long term (current) use of aspirin: Secondary | ICD-10-CM | POA: Diagnosis not present

## 2023-03-24 LAB — BASIC METABOLIC PANEL
Anion gap: 9 (ref 5–15)
BUN: 9 mg/dL (ref 8–23)
CO2: 30 mmol/L (ref 22–32)
Calcium: 8.7 mg/dL — ABNORMAL LOW (ref 8.9–10.3)
Chloride: 90 mmol/L — ABNORMAL LOW (ref 98–111)
Creatinine, Ser: 0.66 mg/dL (ref 0.44–1.00)
GFR, Estimated: >60 mL/min (ref >60)
Glucose, Bld: 172 mg/dL — ABNORMAL HIGH (ref 70–99)
Potassium: 3.4 mmol/L — ABNORMAL LOW (ref 3.5–5.1)
Sodium: 129 mmol/L — ABNORMAL LOW (ref 135–145)

## 2023-03-24 LAB — GLUCOSE, CAPILLARY
Glucose-Capillary: 180 mg/dL — ABNORMAL HIGH (ref 70–99)
Glucose-Capillary: 194 mg/dL — ABNORMAL HIGH (ref 70–99)
Glucose-Capillary: 168 mg/dL — ABNORMAL HIGH (ref 70–99)
Glucose-Capillary: 170 mg/dL — ABNORMAL HIGH (ref 70–99)

## 2023-03-24 NOTE — Progress Notes (Signed)
Subjective: 3 Days Post-Op Procedure(s) (LRB): TOTAL KNEE ARTHROPLASTY (Right) Patient reports pain as moderate.  Pain better today.   Objective: Vital signs in last 24 hours: Temp:  [97.8 F (36.6 C)-99.5 F (37.5 C)] 97.8 F (36.6 C) (10/21 0650) Pulse Rate:  [76-87] 84 (10/21 0650) Resp:  [15-16] 15 (10/21 0650) BP: (133-144)/(61-71) 133/61 (10/21 0650) SpO2:  [94 %-97 %] 94 % (10/21 0650)  Intake/Output from previous day: 10/20 0701 - 10/21 0700 In: 480 [P.O.:480] Out: 2000 [Urine:2000] Intake/Output this shift: No intake/output data recorded.  Recent Labs    03/23/23 0856  HGB 11.4*   Recent Labs    03/23/23 0856  WBC 10.6*  RBC 3.70*  HCT 30.7*  PLT 208   Recent Labs    03/23/23 0856 03/24/23 0321  NA 129* 129*  K 3.4* 3.4*  CL 91* 90*  CO2 29 30  BUN 9 9  CREATININE 0.64 0.66  GLUCOSE 183* 172*  CALCIUM 8.7* 8.7*   No results for input(s): "LABPT", "INR" in the last 72 hours.  Neurologically intact ABD soft Neurovascular intact Sensation intact distally Intact pulses distally Dorsiflexion/Plantar flexion intact Incision: dressing C/D/I and no drainage No cellulitis present Compartment soft No calf pain or sign of DVT   Assessment/Plan: 3 Days Post-Op Procedure(s) (LRB): TOTAL KNEE ARTHROPLASTY (Right) Advance diet Up with therapy D/C IV fluids Awaiting possible SNF  Dorothy Spark 03/24/2023, 8:52 AM

## 2023-03-24 NOTE — Plan of Care (Signed)
  Problem: Safety: Goal: Ability to remain free from injury will improve Outcome: Progressing   Problem: Pain Managment: Goal: General experience of comfort will improve Outcome: Progressing   

## 2023-03-24 NOTE — Progress Notes (Signed)
Physical Therapy Treatment Patient Details Name: [name] MRN: [id number] DOB: 1955-12-23 Today's Date: 03/24/2023   History of Present Illness 67 yo female presents to therapy s/p R TKA on 03/21/2023 due to failure of conservative measures. Pt PMH includes but is not limited to back pain, fibromyalgia, GERD, HTN, HLD, IBS, and neuromuscular disorder.    PT Comments  Improved tolerance for PT session this am. Pt much more alert and able to complete transfers/mobility with less physical assist. Pt able to increase gait distance to 39' with RW, CGA, and increased tolerance to weight shift onto R LE. Continues to have difficulty completing R SLR, therefore KI was utilized during gait. Pt left in upright sitting and encouraged to complete heel slides on pillow case. Will see in pm for further progression. 6/10 pain while ambulating, minimal c/o nausea.   If plan is discharge home, recommend the following: A lot of help with walking and/or transfers;A little help with bathing/dressing/bathroom;Assistance with cooking/housework;Assist for transportation;Help with stairs or ramp for entrance   Can travel by private vehicle     No  Equipment Recommendations  Rolling walker (2 wheels)    Recommendations for Other Services       Precautions / Restrictions Precautions Precautions: Knee;Fall Precaution Booklet Issued: Yes (comment) Precaution Comments:  (Previously reviewed with pt and pt's sister) Required Braces or Orthoses: Knee Immobilizer - Right Knee Immobilizer - Right: On when out of bed or walking;Discontinue once straight leg raise with < 10 degree lag Restrictions Weight Bearing Restrictions: Yes RLE Weight Bearing: Weight bearing as tolerated     Mobility  Bed Mobility Overal bed mobility: Needs Assistance Bed Mobility: Supine to Sit     Supine to sit: Mod assist, HOB elevated, Used rails     General bed mobility comments: Improved ability to maneuver to edge of  bed    Transfers Overall transfer level: Needs assistance Equipment used: Rolling walker (2 wheels) Transfers: Sit to/from Stand, Bed to chair/wheelchair/BSC Sit to Stand: Min assist, Mod assist           General transfer comment: cues for LE managment and use of UEs to self assist    Ambulation/Gait Ambulation/Gait assistance: Contact guard assist Gait Distance (Feet):  (55) Assistive device: Rolling walker (2 wheels) Gait Pattern/deviations: Step-to pattern, Decreased step length - left, Shuffle Gait velocity: decr     General Gait Details: Increased time to complete, better demo of wt shifting onto R LE, repeated vc's for upright posture   Stairs             Wheelchair Mobility     Tilt Bed    Modified Rankin (Stroke Patients Only)       Balance Overall balance assessment: Needs assistance, History of Falls Sitting-balance support: Feet supported Sitting balance-[name] Scale: Good     Standing balance support: Bilateral upper extremity supported, During functional activity, Reliant on assistive device for balance Standing balance-[name] Scale: Fair Standing balance comment: Pt with heavy reliance on RW                            Cognition Arousal: Alert Behavior During Therapy: WFL for tasks assessed/performed Overall Cognitive Status: Within Functional Limits for tasks assessed                                 General Comments: Improved from previous session  Exercises Total Joint Exercises Ankle Circles/Pumps: AROM, Both, 10 reps Quad Sets: AROM, Right, 5 reps, Supine Heel Slides: AAROM, Right, 5 reps Straight Leg Raises: AAROM, Right, 5 reps, Supine Long Arc Quad: AAROM, Right, 5 reps, Seated Knee Flexion: AAROM, Right, 5 reps, Seated Goniometric ROM: 5-90 Marching in Standing: Seated    General Comments General comments (skin integrity, edema, etc.):  (Right knee dressing intact, ace wrap adjusted.)       Pertinent Vitals/Pain Pain Assessment Pain Assessment: 0-10 Pain Score: 6  Pain Location: R LE/knee Pain Descriptors / Indicators: Aching, Constant, Discomfort, Grimacing, Operative site guarding Pain Intervention(s): Monitored during session    Home Living                          Prior Function            PT Goals (current goals can now be found in the care plan section) Acute Rehab PT Goals Patient Stated Goal: to be able to return to water areobics at the [facility] Progress towards PT goals: Progressing toward goals    Frequency    7X/week      PT Plan      Co-evaluation              AM-PAC PT "6 Clicks" Mobility   Outcome Measure  Help needed turning from your back to your side while in a flat bed without using bedrails?: A Little Help needed moving from lying on your back to sitting on the side of a flat bed without using bedrails?: A Little Help needed moving to and from a bed to a chair (including a wheelchair)?: A Lot Help needed standing up from a chair using your arms (e.g., wheelchair or bedside chair)?: A Lot Help needed to walk in hospital room?: A Little Help needed climbing 3-5 steps with a railing? : Total 6 Click Score: 14    End of Session Equipment Utilized During Treatment: Gait belt Activity Tolerance: Patient tolerated treatment well Patient left: in chair;with call bell/phone within reach;with chair alarm set;with family/visitor present Nurse Communication: Mobility status PT Visit Diagnosis: Unsteadiness on feet (R26.81);Other abnormalities of gait and mobility (R26.89);Muscle weakness (generalized) (M62.81);History of falling (Z91.81);Difficulty in walking, not elsewhere classified (R26.2);Pain Pain - Right/Left: Right Pain - part of body: Leg;Knee     Time: 1016-1050 PT Time Calculation (min) (ACUTE ONLY): 34 min  Charges:    $Gait Training: 8-22 mins $Therapeutic Exercise: 8-22 mins PT General Charges $$ ACUTE PT  VISIT: 1 Visit                    Zadie Cleverly, PTA  [name] 03/24/2023, 12:42 PM

## 2023-03-24 NOTE — Progress Notes (Signed)
Physical Therapy Treatment Patient Details Name: [name] MRN: [id number] DOB: [birth year] Today's Date: 03/24/2023   History of Present Illness 67 yo female presents to therapy s/p R TKA on 03/21/2023 due to failure of conservative measures. Pt PMH includes but is not limited to back pain, fibromyalgia, GERD, HTN, HLD, IBS, and neuromuscular disorder.    PT Comments  Pt progressing towards functional goals. Increased gait distance to 70ft with RW and light CGA. Very slow 3 point gait pattern with vc's for posture, sequencing, and wt shifting. KI donned due to inability to initiate or complete R SLR, anticipate progression tomorrow. Overall much improved since weekend. Will continue to progress until transition to STR.    If plan is discharge home, recommend the following: A lot of help with walking and/or transfers;A little help with bathing/dressing/bathroom;Assistance with cooking/housework;Assist for transportation;Help with stairs or ramp for entrance   Can travel by private vehicle     Yes  Equipment Recommendations  Rolling walker (2 wheels)    Recommendations for Other Services       Precautions / Restrictions Precautions Precautions: Knee;Fall Precaution Booklet Issued: Yes (comment) Required Braces or Orthoses: Knee Immobilizer - Right Knee Immobilizer - Right: On when out of bed or walking;Discontinue once straight leg raise with < 10 degree lag Restrictions Weight Bearing Restrictions: Yes RLE Weight Bearing: Weight bearing as tolerated     Mobility  Bed Mobility Overal bed mobility: Needs Assistance Bed Mobility: Sit to Supine     Supine to sit: Mod assist, HOB elevated, Used rails Sit to supine: Min assist (For R LE)   General bed mobility comments: Improved ability to maneuver to edge of bed    Transfers Overall transfer level: Needs assistance Equipment used: Rolling walker (2 wheels) Transfers: Sit to/from Stand, Bed to chair/wheelchair/BSC Sit  to Stand: Min assist, Mod assist           General transfer comment: cues for LE managment and use of UEs to self assist    Ambulation/Gait Ambulation/Gait assistance: Contact guard assist Gait Distance (Feet): 75 Feet Assistive device: Rolling walker (2 wheels) Gait Pattern/deviations: Step-to pattern, Decreased step length - left, Shuffle Gait velocity: decr     General Gait Details: Pt required 15 minutes to ambulate 69ft   Stairs             Wheelchair Mobility     Tilt Bed    Modified Rankin (Stroke Patients Only)       Balance Overall balance assessment: Needs assistance, History of Falls Sitting-balance support: Feet supported Sitting balance-[name] Scale: Good     Standing balance support: Bilateral upper extremity supported, During functional activity, Reliant on assistive device for balance Standing balance-[name] Scale: Fair Standing balance comment: Pt with heavy reliance on RW                            Cognition Arousal: Alert Behavior During Therapy: WFL for tasks assessed/performed Overall Cognitive Status: Within Functional Limits for tasks assessed                                 General Comments: Improved from previous session        Exercises Total Joint Exercises Ankle Circles/Pumps: AROM, Both, 10 reps Quad Sets: AROM, Right, 5 reps, Supine Heel Slides: AAROM, Right, 5 reps Straight Leg Raises: AAROM, Right, 5 reps, Supine Long  Arc Quad: AAROM, Right, 5 reps, Seated Knee Flexion: AAROM, Right, 5 reps, Seated Goniometric ROM: 5-90 Marching in Standing: Seated    General Comments        Pertinent Vitals/Pain Pain Assessment Pain Assessment: 0-10 Pain Score: 4  Pain Location: R LE/knee Pain Descriptors / Indicators: Aching, Sore, Operative site guarding Pain Intervention(s): Premedicated before session    Home Living                          Prior Function            PT Goals  (current goals can now be found in the care plan section) Acute Rehab PT Goals Patient Stated Goal: to be able to return to [facility] areobics at the [facility] Progress towards PT goals: Progressing toward goals    Frequency    7X/week      PT Plan      Co-evaluation              AM-PAC PT "6 Clicks" Mobility   Outcome Measure  Help needed turning from your back to your side while in a flat bed without using bedrails?: A Little Help needed moving from lying on your back to sitting on the side of a flat bed without using bedrails?: A Little Help needed moving to and from a bed to a chair (including a wheelchair)?: A Little Help needed standing up from a chair using your arms (e.g., wheelchair or bedside chair)?: A Lot Help needed to walk in hospital room?: A Little Help needed climbing 3-5 steps with a railing? : Total 6 Click Score: 15    End of Session Equipment Utilized During Treatment: Gait belt Activity Tolerance: Patient tolerated treatment well Patient left: in bed;with call bell/phone within reach;with bed alarm set;with family/visitor present Nurse Communication: Mobility status PT Visit Diagnosis: Unsteadiness on feet (R26.81);Other abnormalities of gait and mobility (R26.89);Muscle weakness (generalized) (M62.81);History of falling (Z91.81);Difficulty in walking, not elsewhere classified (R26.2);Pain Pain - Right/Left: Right Pain - part of body: Leg;Knee     Time: 1501-1530 PT Time Calculation (min) (ACUTE ONLY): 29 min  Charges:    $Gait Training: 8-22 mins $Therapeutic Exercise: 8-22 mins PT General Charges $$ ACUTE PT VISIT: 1 Visit                    Zadie Cleverly, PTA  [name] 03/24/2023, 4:26 PM

## 2023-03-24 NOTE — TOC Progression Note (Signed)
Transition of Care [facility]) - Progression Note    Patient Details  Name: [name] MRN: [id number] Date of Birth: 1955/07/27  [facility] CM/SW Contact  Amada Jupiter, LCSW Phone Number: 03/24/2023, 1:44 PM  Clinical Narrative:    Have reviewed SNF bed offers with pt and she has accepted bed at [facility] who can admit pt tomorrow.  Have secured insurance authorization [id number]).  Have alerted MD and RN.   Expected Discharge Plan: Home w Home Health Services Barriers to Discharge: No Barriers Identified  Expected Discharge Plan and Services In-house Referral: Clinical Social Work Discharge Planning Services: NA Post Acute Care Choice: Skilled Nursing Facility, Durable Medical Equipment Living arrangements for the past 2 months: Single Family Home                                       Social Determinants of Health (SDOH) Interventions SDOH Screenings   Food Insecurity: No Food Insecurity (03/21/2023)  Housing: Low Risk  (03/21/2023)  Transportation Needs: No Transportation Needs (03/21/2023)  Utilities: Not At Risk (03/21/2023)  Alcohol Screen: Low Risk  (09/11/2022)  Depression (PHQ2-9): Low Risk  (09/11/2022)  Financial Resource Strain: Medium Risk (12/24/2022)  Physical Activity: Insufficiently Active (09/11/2022)  Social Connections: Moderately Isolated (12/24/2022)  Stress: No Stress Concern Present (09/11/2022)  Tobacco Use: Medium Risk (03/21/2023)    Readmission Risk Interventions     No data to display

## 2023-03-25 DIAGNOSIS — K219 Gastro-esophageal reflux disease without esophagitis: Secondary | ICD-10-CM | POA: Diagnosis not present

## 2023-03-25 DIAGNOSIS — J302 Other seasonal allergic rhinitis: Secondary | ICD-10-CM | POA: Diagnosis not present

## 2023-03-25 DIAGNOSIS — K582 Mixed irritable bowel syndrome: Secondary | ICD-10-CM | POA: Diagnosis not present

## 2023-03-25 DIAGNOSIS — M1990 Unspecified osteoarthritis, unspecified site: Secondary | ICD-10-CM | POA: Diagnosis not present

## 2023-03-25 DIAGNOSIS — G7089 Other specified myoneural disorders: Secondary | ICD-10-CM | POA: Diagnosis not present

## 2023-03-25 DIAGNOSIS — Z96651 Presence of right artificial knee joint: Secondary | ICD-10-CM | POA: Diagnosis not present

## 2023-03-25 DIAGNOSIS — E785 Hyperlipidemia, unspecified: Secondary | ICD-10-CM | POA: Diagnosis not present

## 2023-03-25 DIAGNOSIS — R6889 Other general symptoms and signs: Secondary | ICD-10-CM | POA: Diagnosis not present

## 2023-03-25 DIAGNOSIS — R531 Weakness: Secondary | ICD-10-CM | POA: Diagnosis not present

## 2023-03-25 DIAGNOSIS — Z5189 Encounter for other specified aftercare: Secondary | ICD-10-CM | POA: Diagnosis not present

## 2023-03-25 DIAGNOSIS — R011 Cardiac murmur, unspecified: Secondary | ICD-10-CM | POA: Diagnosis not present

## 2023-03-25 DIAGNOSIS — M1711 Unilateral primary osteoarthritis, right knee: Secondary | ICD-10-CM | POA: Diagnosis not present

## 2023-03-25 DIAGNOSIS — Z7401 Bed confinement status: Secondary | ICD-10-CM | POA: Diagnosis not present

## 2023-03-25 DIAGNOSIS — R2689 Other abnormalities of gait and mobility: Secondary | ICD-10-CM | POA: Diagnosis not present

## 2023-03-25 DIAGNOSIS — I10 Essential (primary) hypertension: Secondary | ICD-10-CM | POA: Diagnosis not present

## 2023-03-25 DIAGNOSIS — Z79899 Other long term (current) drug therapy: Secondary | ICD-10-CM | POA: Diagnosis not present

## 2023-03-25 DIAGNOSIS — K5900 Constipation, unspecified: Secondary | ICD-10-CM | POA: Diagnosis not present

## 2023-03-25 DIAGNOSIS — Z87891 Personal history of nicotine dependence: Secondary | ICD-10-CM | POA: Diagnosis not present

## 2023-03-25 DIAGNOSIS — Z7982 Long term (current) use of aspirin: Secondary | ICD-10-CM | POA: Diagnosis not present

## 2023-03-25 DIAGNOSIS — K588 Other irritable bowel syndrome: Secondary | ICD-10-CM | POA: Diagnosis not present

## 2023-03-25 DIAGNOSIS — Z471 Aftercare following joint replacement surgery: Secondary | ICD-10-CM | POA: Diagnosis not present

## 2023-03-25 DIAGNOSIS — M150 Primary generalized (osteo)arthritis: Secondary | ICD-10-CM | POA: Diagnosis not present

## 2023-03-25 DIAGNOSIS — M6281 Muscle weakness (generalized): Secondary | ICD-10-CM | POA: Diagnosis not present

## 2023-03-25 DIAGNOSIS — M797 Fibromyalgia: Secondary | ICD-10-CM | POA: Diagnosis not present

## 2023-03-25 DIAGNOSIS — R2681 Unsteadiness on feet: Secondary | ICD-10-CM | POA: Diagnosis not present

## 2023-03-25 LAB — GLUCOSE, CAPILLARY: Glucose-Capillary: 156 mg/dL — ABNORMAL HIGH (ref 70–99)

## 2023-03-25 MED ORDER — ASPIRIN 81 MG PO TBEC: 81.0000 mg | DELAYED_RELEASE_TABLET | Freq: Two times a day (BID) | ORAL | 1 refills | Status: DC

## 2023-03-25 MED ORDER — POLYETHYLENE GLYCOL 3350 17 G PO PACK: 17.0000 g | PACK | Freq: Every day | ORAL | 0 refills | Status: AC

## 2023-03-25 MED ORDER — OXYCODONE HCL 5 MG PO TABS: 5.0000 mg | ORAL_TABLET | ORAL | 0 refills | Status: DC | PRN

## 2023-03-25 MED ORDER — METHOCARBAMOL 500 MG PO TABS: 500.0000 mg | ORAL_TABLET | Freq: Three times a day (TID) | ORAL | 1 refills | Status: DC | PRN

## 2023-03-25 MED ORDER — DOCUSATE SODIUM 100 MG PO CAPS: 100.0000 mg | ORAL_CAPSULE | Freq: Two times a day (BID) | ORAL | 2 refills | Status: AC

## 2023-03-25 NOTE — TOC Transition Note (Signed)
Transition of Care [facility]) - CM/SW Discharge Note   Patient Details  Name: [name] MRN: [id number] Date of Birth: 02/28/1956  Transition of Care [facility]) CM/SW Contact:  Amada Jupiter, LCSW Phone Number: 03/25/2023, 10:54 AM   Clinical Narrative:     Pt medically cleared for dc today to [facility] and have ins authorization.  Pt and sister aware and agreeable.  PTAR called at 10:55am.  RN to call report to [phone].  No further TOC needs.  Final next level of care: Skilled Nursing Facility Barriers to Discharge: Barriers Resolved   Patient Goals and CMS Choice      Discharge Placement PASRR number recieved: 03/23/23 PASRR number recieved: 03/23/23            Patient chooses bed at: [facility] Patient to be transferred to facility by: PTAR Name of family member notified: pt and sister Patient and family notified of of transfer: 03/25/23  Discharge Plan and Services Additional resources added to the After Visit Summary for   In-house Referral: Clinical Social Work Discharge Planning Services: NA Post Acute Care Choice: Skilled Nursing Facility, Durable Medical Equipment          DME Arranged: N/A DME Agency: NA                  Social Determinants of Health (SDOH) Interventions SDOH Screenings   Food Insecurity: No Food Insecurity (03/21/2023)  Housing: Low Risk  (03/21/2023)  Transportation Needs: No Transportation Needs (03/21/2023)  Utilities: Not At Risk (03/21/2023)  Alcohol Screen: Low Risk  (09/11/2022)  Depression (PHQ2-9): Low Risk  (09/11/2022)  Financial Resource Strain: Medium Risk (12/24/2022)  Physical Activity: Insufficiently Active (09/11/2022)  Social Connections: Moderately Isolated (12/24/2022)  Stress: No Stress Concern Present (09/11/2022)  Tobacco Use: Medium Risk (03/21/2023)     Readmission Risk Interventions     No data to display

## 2023-03-25 NOTE — Progress Notes (Signed)
Subjective: 4 Days Post-Op Procedure(s) (LRB): TOTAL KNEE ARTHROPLASTY (Right) Patient reports pain as mild and moderate.    Objective: Vital signs in last 24 hours: Temp:  [98.2 F (36.8 C)-98.5 F (36.9 C)] 98.4 F (36.9 C) (10/22 0937) Pulse Rate:  [79-82] 79 (10/22 0937) Resp:  [15-16] 16 (10/22 0937) BP: (132-144)/(64-73) 142/70 (10/22 0937) SpO2:  [94 %-100 %] 100 % (10/22 0937)  Intake/Output from previous day: 10/21 0701 - 10/22 0700 In: 350 [P.O.:350] Out: 500 [Urine:500] Intake/Output this shift: No intake/output data recorded.  Recent Labs    03/23/23 0856  HGB 11.4*   Recent Labs    03/23/23 0856  WBC 10.6*  RBC 3.70*  HCT 30.7*  PLT 208   Recent Labs    03/23/23 0856 03/24/23 0321  NA 129* 129*  K 3.4* 3.4*  CL 91* 90*  CO2 29 30  BUN 9 9  CREATININE 0.64 0.66  GLUCOSE 183* 172*  CALCIUM 8.7* 8.7*   No results for input(s): "LABPT", "INR" in the last 72 hours.  Neurologically intact ABD soft Neurovascular intact Sensation intact distally Intact pulses distally Dorsiflexion/Plantar flexion intact Incision: dressing C/D/I and no drainage No cellulitis present Compartment soft No sign of DVT   Assessment/Plan: 4 Days Post-Op Procedure(s) (LRB): TOTAL KNEE ARTHROPLASTY (Right) Advance diet Up with therapy D/C IV fluids D/C to SNF today   [name] 03/25/2023, 9:53 AM

## 2023-03-25 NOTE — Progress Notes (Signed)
Called facility and gave report to [place], [place].  Pt is alert and oriented x 4, on room air, not in any distress. Discharged to SNF with belongings via PTAR.

## 2023-03-25 NOTE — Plan of Care (Signed)
  Problem: Education: Goal: Knowledge of General Education information will improve Description Including pain rating scale, medication(s)/side effects and non-pharmacologic comfort measures Outcome: Progressing   Problem: Clinical Measurements: Goal: Ability to maintain clinical measurements within normal limits will improve Outcome: Progressing   Problem: Elimination: Goal: Will not experience complications related to bowel motility Outcome: Progressing   Problem: Pain Managment: Goal: General experience of comfort will improve Outcome: Progressing   Problem: Safety: Goal: Ability to remain free from injury will improve Outcome: Progressing   Problem: Skin Integrity: Goal: Risk for impaired skin integrity will decrease Outcome: Progressing   

## 2023-03-25 NOTE — Discharge Summary (Signed)
Physician Discharge Summary   Patient ID: [name] MRN: [id number] DOB/AGE: 1955/06/19 67 y.o.  Admit date: 03/21/2023 Discharge date: 03/25/23  Primary Diagnosis: right knee primary osteoarthritis Admission Diagnoses:  Past Medical History:  Diagnosis Date   Allergy    SEASONAL   Arthritis    Back pain    Benign fundic gland polyps of stomach    Cataract    EARL STAGES   Constipation    Esophagitis    LA Class B   Family history of adverse reaction to anesthesia    Maternal aunt died on the table at age 38 while having a hysterectomy. only had hx of DM.   Fatigue    Fatty liver    Fibromyalgia    Gallbladder problem    GERD (gastroesophageal reflux disease)    Heart murmur    History of arthroscopy of right knee    Hyperlipidemia    Hypertension    IBS (irritable bowel syndrome)    Joint pain    Neuromuscular disorder (HCC)    Pneumonia    Pre-diabetes    Discharge Diagnoses:   Principal Problem:   Right knee DJD  Estimated body mass index is 33.6 kg/m as calculated from the following:   Height as of this encounter: 5\' 8"  (1.727 m).   Weight as of this encounter: 100.2 kg.  Procedure:  Procedure(s) (LRB): TOTAL KNEE ARTHROPLASTY (Right)   Consults: None  HPI: see H&P Laboratory Data: Admission on 03/21/2023  Component Date Value Ref Range Status   Glucose-Capillary 03/21/2023 102 (H)  70 - 99 mg/dL Final   Glucose reference range applies only to samples taken after fasting for at least 8 hours.   Glucose-Capillary 03/21/2023 93  70 - 99 mg/dL Final   Glucose reference range applies only to samples taken after fasting for at least 8 hours.   Glucose-Capillary 03/21/2023 135 (H)  70 - 99 mg/dL Final   Glucose reference range applies only to samples taken after fasting for at least 8 hours.   Comment 1 03/21/2023 Notify RN   Final   Comment 2 03/21/2023 Document in Chart   Final   Glucose-Capillary 03/21/2023 126 (H)  70 - 99 mg/dL Final    Glucose reference range applies only to samples taken after fasting for at least 8 hours.   WBC 03/23/2023 10.6 (H)  4.0 - 10.5 K/uL Final   RBC 03/23/2023 3.70 (L)  3.87 - 5.11 MIL/uL Final   Hemoglobin 03/23/2023 11.4 (L)  12.0 - 15.0 g/dL Final   HCT 41/32/4401 30.7 (L)  36.0 - 46.0 % Final   MCV 03/23/2023 83.0  80.0 - 100.0 fL Final   MCH 03/23/2023 30.8  26.0 - 34.0 pg Final   MCHC 03/23/2023 37.1 (H)  30.0 - 36.0 g/dL Final   RDW 02/72/5366 14.6  11.5 - 15.5 % Final   Platelets 03/23/2023 208  150 - 400 K/uL Final   nRBC 03/23/2023 0.0  0.0 - 0.2 % Final   Neutrophils Relative % 03/23/2023 61  % Final   Neutro Abs 03/23/2023 6.6  1.7 - 7.7 K/uL Final   Lymphocytes Relative 03/23/2023 22  % Final   Lymphs Abs 03/23/2023 2.3  0.7 - 4.0 K/uL Final   Monocytes Relative 03/23/2023 16  % Final   Monocytes Absolute 03/23/2023 1.7 (H)  0.1 - 1.0 K/uL Final   Eosinophils Relative 03/23/2023 0  % Final   Eosinophils Absolute 03/23/2023 0.0  0.0 - 0.5 K/uL  Final   Basophils Relative 03/23/2023 0  % Final   Basophils Absolute 03/23/2023 0.0  0.0 - 0.1 K/uL Final   Immature Granulocytes 03/23/2023 1  % Final   Abs Immature Granulocytes 03/23/2023 0.05  0.00 - 0.07 K/uL Final   Performed at [facility], [address], [place], [place] [address]   Sodium 03/23/2023 129 (L)  135 - 145 mmol/L Final   Potassium 03/23/2023 3.4 (L)  3.5 - 5.1 mmol/L Final   Chloride 03/23/2023 91 (L)  98 - 111 mmol/L Final   CO2 03/23/2023 29  22 - 32 mmol/L Final   Glucose, Bld 03/23/2023 183 (H)  70 - 99 mg/dL Final   Glucose reference range applies only to samples taken after fasting for at least 8 hours.   BUN 03/23/2023 9  8 - 23 mg/dL Final   Creatinine, Ser 03/23/2023 0.64  0.44 - 1.00 mg/dL Final   Calcium 27/25/3664 8.7 (L)  8.9 - 10.3 mg/dL Final   GFR, Estimated 03/23/2023 >60  >60 mL/min Final   Comment: (NOTE) Calculated using the CKD-EPI Creatinine Equation (2021)    Anion  gap 03/23/2023 9  5 - 15 Final   Performed at [facility], [address] [place], [place] [address]   Glucose-Capillary 03/23/2023 198 (H)  70 - 99 mg/dL Final   Glucose reference range applies only to samples taken after fasting for at least 8 hours.   Sodium 03/24/2023 129 (L)  135 - 145 mmol/L Final   Potassium 03/24/2023 3.4 (L)  3.5 - 5.1 mmol/L Final   Chloride 03/24/2023 90 (L)  98 - 111 mmol/L Final   CO2 03/24/2023 30  22 - 32 mmol/L Final   Glucose, Bld 03/24/2023 172 (H)  70 - 99 mg/dL Final   Glucose reference range applies only to samples taken after fasting for at least 8 hours.   BUN 03/24/2023 9  8 - 23 mg/dL Final   Creatinine, Ser 03/24/2023 0.66  0.44 - 1.00 mg/dL Final   Calcium 42/59/5638 8.7 (L)  8.9 - 10.3 mg/dL Final   GFR, Estimated 03/24/2023 >60  >60 mL/min Final   Comment: (NOTE) Calculated using the CKD-EPI Creatinine Equation (2021)    Anion gap 03/24/2023 9  5 - 15 Final   Performed at [facility], [address], [place], [place] [address]   Glucose-Capillary 03/23/2023 170 (H)  70 - 99 mg/dL Final   Glucose reference range applies only to samples taken after fasting for at least 8 hours.   Glucose-Capillary 03/24/2023 180 (H)  70 - 99 mg/dL Final   Glucose reference range applies only to samples taken after fasting for at least 8 hours.   Glucose-Capillary 03/24/2023 194 (H)  70 - 99 mg/dL Final   Glucose reference range applies only to samples taken after fasting for at least 8 hours.   Glucose-Capillary 03/24/2023 168 (H)  70 - 99 mg/dL Final   Glucose reference range applies only to samples taken after fasting for at least 8 hours.   Glucose-Capillary 03/24/2023 170 (H)  70 - 99 mg/dL Final   Glucose reference range applies only to samples taken after fasting for at least 8 hours.   Glucose-Capillary 03/25/2023 156 (H)  70 - 99 mg/dL Final   Glucose reference range applies only to samples taken after fasting  for at least 8 hours.  Hospital Outpatient Visit on 03/12/2023  Component Date Value Ref Range Status   MRSA,  PCR 03/12/2023 NEGATIVE  NEGATIVE Final   Staphylococcus aureus 03/12/2023 NEGATIVE  NEGATIVE Final   Comment: (NOTE) The Xpert SA Assay (FDA approved for NASAL specimens in patients 47 years of age and older), is one component of a comprehensive surveillance program. It is not intended to diagnose infection nor to guide or monitor treatment. Performed at [facility], [address] [place], [place] [address]    Sodium 03/12/2023 136  135 - 145 mmol/L Final   Potassium 03/12/2023 3.5  3.5 - 5.1 mmol/L Final   Chloride 03/12/2023 98  98 - 111 mmol/L Final   CO2 03/12/2023 27  22 - 32 mmol/L Final   Glucose, Bld 03/12/2023 121 (H)  70 - 99 mg/dL Final   Glucose reference range applies only to samples taken after fasting for at least 8 hours.   BUN 03/12/2023 16  8 - 23 mg/dL Final   Creatinine, Ser 03/12/2023 0.71  0.44 - 1.00 mg/dL Final   Calcium 86/57/8469 10.0  8.9 - 10.3 mg/dL Final   Total Protein 62/95/2841 7.6  6.5 - 8.1 g/dL Final   Albumin 32/44/0102 4.2  3.5 - 5.0 g/dL Final   AST 72/53/6644 18  15 - 41 U/L Final   ALT 03/12/2023 36  0 - 44 U/L Final   Alkaline Phosphatase 03/12/2023 71  38 - 126 U/L Final   Total Bilirubin 03/12/2023 0.7  0.3 - 1.2 mg/dL Final   GFR, Estimated 03/12/2023 >60  >60 mL/min Final   Comment: (NOTE) Calculated using the CKD-EPI Creatinine Equation (2021)    Anion gap 03/12/2023 11  5 - 15 Final   Performed at [facility], [address], [place], [place] [address]   WBC 03/12/2023 4.5  4.0 - 10.5 K/uL Final   RBC 03/12/2023 4.73  3.87 - 5.11 MIL/uL Final   Hemoglobin 03/12/2023 14.6  12.0 - 15.0 g/dL Final   HCT 25/95/6387 40.1  36.0 - 46.0 % Final   MCV 03/12/2023 84.8  80.0 - 100.0 fL Final   MCH 03/12/2023 30.9  26.0 - 34.0 pg Final   MCHC 03/12/2023 36.4 (H)  30.0 - 36.0 g/dL Final    RDW 56/43/3295 14.6  11.5 - 15.5 % Final   Platelets 03/12/2023 218  150 - 400 K/uL Final   nRBC 03/12/2023 0.0  0.0 - 0.2 % Final   Performed at [facility], [address] [place], [place] [address]     X-Rays:DG Knee 1-2 Views Right  Result Date: 03/21/2023 CLINICAL DATA:  Postop total knee arthroplasty. EXAM: RIGHT KNEE - 1-2 VIEW COMPARISON:  Preoperative radiographs 03/12/2023. FINDINGS: Interval right total knee arthroplasty. The hardware is well positioned. No evidence of acute fracture or dislocation. There is air in the joint and surrounding soft tissues. No unexpected foreign body. IMPRESSION: Expected postoperative appearance status post right total knee arthroplasty. Electronically Signed   By: Carey Bullocks M.D.   On: 03/21/2023 17:13   CT Head Wo Contrast  Result Date: 03/12/2023 CLINICAL DATA:  Larey Seat down 13 steps EXAM: CT HEAD WITHOUT CONTRAST CT CERVICAL SPINE WITHOUT CONTRAST TECHNIQUE: Multidetector CT imaging of the head and cervical spine was performed following the standard protocol without intravenous contrast. Multiplanar CT image reconstructions of the cervical spine were also generated. RADIATION DOSE REDUCTION: This exam was performed according to the departmental dose-optimization program which includes automated exposure control, adjustment of the mA and/or kV according to patient size and/or use of iterative reconstruction  technique. COMPARISON:  None Available. FINDINGS: CT HEAD FINDINGS Brain: No evidence of acute infarction, hemorrhage, hydrocephalus, extra-axial collection or mass lesion/mass effect. Vascular: No hyperdense vessel or unexpected calcification. Skull: Normal. Negative for fracture or focal lesion. Sinuses/Orbits: No acute finding. Other: None CT CERVICAL SPINE FINDINGS Alignment: Mild reversal of cervical lordosis. No subluxation. Facet alignment within normal limits Skull base and vertebrae: No acute fracture. No primary bone  lesion or focal pathologic process. Soft tissues and spinal canal: No prevertebral fluid or swelling. No visible canal hematoma. Disc levels: Multilevel degenerative change. Mild to moderate disc space narrowing C5-C6 and C6-C7. Upper chest: Negative. Other: None IMPRESSION: 1. Negative non contrasted CT appearance of the brain. 2. Mild reversal of cervical lordosis with degenerative change. No acute osseous abnormality. Electronically Signed   By: Jasmine Pang M.D.   On: 03/12/2023 18:09   CT Cervical Spine Wo Contrast  Result Date: 03/12/2023 CLINICAL DATA:  Larey Seat down 13 steps EXAM: CT HEAD WITHOUT CONTRAST CT CERVICAL SPINE WITHOUT CONTRAST TECHNIQUE: Multidetector CT imaging of the head and cervical spine was performed following the standard protocol without intravenous contrast. Multiplanar CT image reconstructions of the cervical spine were also generated. RADIATION DOSE REDUCTION: This exam was performed according to the departmental dose-optimization program which includes automated exposure control, adjustment of the mA and/or kV according to patient size and/or use of iterative reconstruction technique. COMPARISON:  None Available. FINDINGS: CT HEAD FINDINGS Brain: No evidence of acute infarction, hemorrhage, hydrocephalus, extra-axial collection or mass lesion/mass effect. Vascular: No hyperdense vessel or unexpected calcification. Skull: Normal. Negative for fracture or focal lesion. Sinuses/Orbits: No acute finding. Other: None CT CERVICAL SPINE FINDINGS Alignment: Mild reversal of cervical lordosis. No subluxation. Facet alignment within normal limits Skull base and vertebrae: No acute fracture. No primary bone lesion or focal pathologic process. Soft tissues and spinal canal: No prevertebral fluid or swelling. No visible canal hematoma. Disc levels: Multilevel degenerative change. Mild to moderate disc space narrowing C5-C6 and C6-C7. Upper chest: Negative. Other: None IMPRESSION: 1. Negative non  contrasted CT appearance of the brain. 2. Mild reversal of cervical lordosis with degenerative change. No acute osseous abnormality. Electronically Signed   By: Jasmine Pang M.D.   On: 03/12/2023 18:09   DG Tibia/Fibula Right  Result Date: 03/12/2023 CLINICAL DATA:  Pain after fall down steps today. Right lower leg pain. EXAM: RIGHT TIBIA AND FIBULA - 2 VIEW COMPARISON:  None Available. FINDINGS: No acute fracture. The cortical margins of the tibia and fibula are intact. Ankle alignment is maintained. No focal bone abnormality. Mild soft tissue edema. IMPRESSION: Mild soft tissue edema. No acute fracture. Electronically Signed   By: Narda Rutherford M.D.   On: 03/12/2023 17:06   DG Shoulder Right  Result Date: 03/12/2023 CLINICAL DATA:  Pain after fall down steps today. Right shoulder pain. EXAM: RIGHT SHOULDER - 2+ VIEW COMPARISON:  None Available. FINDINGS: There is no evidence of fracture or dislocation. Mild acromioclavicular degenerative spurring. Soft tissues are unremarkable. No fracture of included right ribs. IMPRESSION: No fracture or dislocation of the right shoulder. Mild acromioclavicular degenerative spurring. Electronically Signed   By: Narda Rutherford M.D.   On: 03/12/2023 17:05   DG Knee Complete 4 Views Right  Result Date: 03/12/2023 CLINICAL DATA:  Pain after fall down steps today. Bilateral knee pain. EXAM: RIGHT KNEE - COMPLETE 4+ VIEW COMPARISON:  None Available. FINDINGS: No acute fracture or dislocation. No knee joint effusion. Mild degenerative change with tricompartmental spurring. Small  patellar tendon enthesophyte. There may be mild anterior soft tissue edema. IMPRESSION: 1. No acute fracture or dislocation of the right knee. 2. Mild tricompartmental osteoarthritis. Electronically Signed   By: Narda Rutherford M.D.   On: 03/12/2023 17:04   DG Knee Complete 4 Views Left  Result Date: 03/12/2023 CLINICAL DATA:  Pain after fall down steps today. Bilateral knee pain. EXAM:  LEFT KNEE - COMPLETE 4+ VIEW COMPARISON:  None Available. FINDINGS: No acute fracture or dislocation. No knee joint effusion. Mild osteoarthritis with peripheral spurring. Small quadriceps and patellar tendon enthesophytes. IMPRESSION: No acute fracture or dislocation of the left knee. Mild osteoarthritis. Electronically Signed   By: Narda Rutherford M.D.   On: 03/12/2023 17:02   DG Hip Unilat W or Wo Pelvis 2-3 Views Left  Result Date: 03/12/2023 CLINICAL DATA:  Pain after fall.  Pelvis/left hip pain. EXAM: DG HIP (WITH OR WITHOUT PELVIS) 2-3V LEFT COMPARISON:  None Available. FINDINGS: No acute fracture of the pelvis or left hip. Femoral head is well seated, no hip dislocation. Moderate bilateral hip osteoarthritis. The pubic symphysis and sacroiliac joints are congruent. IMPRESSION: No acute fracture of the pelvis or left hip. Moderate bilateral hip osteoarthritis. Electronically Signed   By: Narda Rutherford M.D.   On: 03/12/2023 16:59    EKG: Orders placed or performed in visit on 12/25/22   EKG 12-Lead     Hospital Course: [name] is a 67 y.o. who was admitted to [facility]. They were brought to the operating room on 03/21/2023 and underwent Procedure(s): TOTAL KNEE ARTHROPLASTY.  Patient tolerated the procedure well and was later transferred to the recovery room and then to the orthopaedic floor for postoperative care.  They were given PO and IV analgesics for pain control following their surgery.  They were given 24 hours of postoperative antibiotics of  Anti-infectives (From admission, onward)    Start     Dose/Rate Route Frequency Ordered Stop   03/21/23 1800  ceFAZolin (ANCEF) IVPB 2g/100 mL premix        2 g 200 mL/hr over 30 Minutes Intravenous Every 6 hours 03/21/23 1643 03/22/23 0044   03/21/23 0915  ceFAZolin (ANCEF) IVPB 2g/100 mL premix        2 g 200 mL/hr over 30 Minutes Intravenous On call to O.R. 03/21/23 [id number] 03/21/23 1317      and started on DVT  prophylaxis in the form of Aspirin, TED hose, and SCDs .   PT and OT were ordered for total joint protocol.  Discharge planning consulted to help with postop disposition and equipment needs.  Patient had a difficult night on the evening of surgery.  They started to get up OOB with therapy on day one.  Continued to work with therapy into day two. By day three, the patient had progressed with therapy and meeting their goals.  Incision was healing well.  Patient was seen in rounds and was ready to go home on post op day 4   Diet: Diabetic diet Activity:WBAT Follow-up:in 10-14 days Disposition - Skilled nursing facility Discharged Condition: good   Discharge Instructions     Call MD / Call 911   Complete by: As directed    If you experience chest pain or shortness of breath, CALL 911 and be transported to the hospital emergency room.  If you develope a fever above 101 F, pus (white drainage) or increased drainage or redness at the wound, or calf pain, call your surgeon's office.  Constipation Prevention   Complete by: As directed    Drink plenty of fluids.  Prune juice may be helpful.  You may use a stool softener, such as Colace (over the counter) 100 mg twice a day.  Use MiraLax (over the counter) for constipation as needed.   Diet - low sodium heart healthy   Complete by: As directed    Increase activity slowly as tolerated   Complete by: As directed    Post-operative opioid taper instructions:   Complete by: As directed    POST-OPERATIVE OPIOID TAPER INSTRUCTIONS: It is important to wean off of your opioid medication as soon as possible. If you do not need pain medication after your surgery it is ok to stop day one. Opioids include: Codeine, Hydrocodone(Norco, Vicodin), Oxycodone(Percocet, oxycontin) and hydromorphone amongst others.  Long term and even short term use of opiods can cause: Increased pain response Dependence Constipation Depression Respiratory depression And more.   Withdrawal symptoms can include Flu like symptoms Nausea, vomiting And more Techniques to manage these symptoms Hydrate well Eat regular healthy meals Stay active Use relaxation techniques(deep breathing, meditating, yoga) Do Not substitute Alcohol to help with tapering If you have been on opioids for less than two weeks and do not have pain than it is ok to stop all together.  Plan to wean off of opioids This plan should start within one week post op of your joint replacement. Maintain the same interval or time between taking each dose and first decrease the dose.  Cut the total daily intake of opioids by one tablet each day Next start to increase the time between doses. The last dose that should be eliminated is the evening dose.         Allergies as of 03/25/2023       Reactions   Metformin And Related    abd pain   Rosuvastatin Other (See Comments)   Muscle aches        Medication List     TAKE these medications    aspirin EC 81 MG tablet Take 1 tablet (81 mg total) by mouth 2 (two) times daily after a meal. Day after surgery   betamethasone valerate ointment 0.1 % Commonly known as: VALISONE Apply 1 Application topically 2 (two) times daily. What changed:  when to take this reasons to take this   CALTRATE 600 PO Take 600 mg by mouth daily.   cholecalciferol 25 MCG (1000 UNIT) tablet Commonly known as: VITAMIN D3 Take 1,000 Units by mouth daily.   CHOLEST OFF COMPLETE PO Take 1 capsule by mouth daily.   docusate sodium 100 MG capsule Commonly known as: Colace Take 1 capsule (100 mg total) by mouth 2 (two) times daily.   fexofenadine 180 MG tablet Commonly known as: ALLEGRA Take 180 mg by mouth daily as needed for allergies.   hydrochlorothiazide 25 MG tablet Commonly known as: HYDRODIURIL TAKE 1 TABLET(25 MG) BY MOUTH DAILY   Linzess 290 MCG Caps capsule Generic drug: linaclotide Take 290 mcg by mouth daily as needed (constipation).    methocarbamol 500 MG tablet Commonly known as: ROBAXIN Take 1 tablet (500 mg total) by mouth every 8 (eight) hours as needed for muscle spasms.   multivitamin with minerals Tabs tablet Take 1 tablet by mouth daily.   omeprazole 40 MG capsule Commonly known as: PRILOSEC TAKE 1 CAPSULE(40 MG) BY MOUTH DAILY AS NEEDED   oxyCODONE 5 MG immediate release tablet Commonly known as: Oxy IR/ROXICODONE Take 1 tablet (  5 mg total) by mouth every 4 (four) hours as needed for severe pain (pain score 7-10).   polyethylene glycol 17 g packet Commonly known as: MIRALAX / GLYCOLAX Take 17 g by mouth daily.   PROBIOTIC DAILY PO Take 1 capsule by mouth daily.   senna 8.6 MG tablet Commonly known as: SENOKOT Take 5 tablets by mouth at bedtime.   timolol 0.5 % ophthalmic solution Commonly known as: TIMOPTIC Place 1 drop into both eyes 2 (two) times daily.   tirzepatide 5 MG/0.5ML Pen Commonly known as: MOUNJARO Inject 5 mg into the skin once a week.   vitamin C 100 MG tablet Take 100 mg by mouth daily.         Signed: Andrez Grime PA-C Orthopaedic Surgery 03/25/2023, 9:55 AM

## 2023-03-27 DIAGNOSIS — Z471 Aftercare following joint replacement surgery: Secondary | ICD-10-CM | POA: Diagnosis not present

## 2023-03-27 DIAGNOSIS — Z96651 Presence of right artificial knee joint: Secondary | ICD-10-CM | POA: Diagnosis not present

## 2023-03-27 DIAGNOSIS — M6281 Muscle weakness (generalized): Secondary | ICD-10-CM | POA: Diagnosis not present

## 2023-03-27 DIAGNOSIS — R2681 Unsteadiness on feet: Secondary | ICD-10-CM | POA: Diagnosis not present

## 2023-03-27 DIAGNOSIS — R2689 Other abnormalities of gait and mobility: Secondary | ICD-10-CM | POA: Diagnosis not present

## 2023-03-28 DIAGNOSIS — K219 Gastro-esophageal reflux disease without esophagitis: Secondary | ICD-10-CM | POA: Diagnosis not present

## 2023-03-31 DIAGNOSIS — M150 Primary generalized (osteo)arthritis: Secondary | ICD-10-CM | POA: Diagnosis not present

## 2023-03-31 DIAGNOSIS — R531 Weakness: Secondary | ICD-10-CM | POA: Diagnosis not present

## 2023-03-31 DIAGNOSIS — R2681 Unsteadiness on feet: Secondary | ICD-10-CM | POA: Diagnosis not present

## 2023-03-31 DIAGNOSIS — Z96651 Presence of right artificial knee joint: Secondary | ICD-10-CM | POA: Diagnosis not present

## 2023-03-31 DIAGNOSIS — K582 Mixed irritable bowel syndrome: Secondary | ICD-10-CM | POA: Diagnosis not present

## 2023-03-31 DIAGNOSIS — R2689 Other abnormalities of gait and mobility: Secondary | ICD-10-CM | POA: Diagnosis not present

## 2023-03-31 DIAGNOSIS — M6281 Muscle weakness (generalized): Secondary | ICD-10-CM | POA: Diagnosis not present

## 2023-03-31 DIAGNOSIS — Z471 Aftercare following joint replacement surgery: Secondary | ICD-10-CM | POA: Diagnosis not present

## 2023-04-02 DIAGNOSIS — M150 Primary generalized (osteo)arthritis: Secondary | ICD-10-CM | POA: Diagnosis not present

## 2023-04-02 DIAGNOSIS — I10 Essential (primary) hypertension: Secondary | ICD-10-CM | POA: Diagnosis not present

## 2023-04-04 DIAGNOSIS — Z5189 Encounter for other specified aftercare: Secondary | ICD-10-CM | POA: Diagnosis not present

## 2023-04-07 DIAGNOSIS — M6281 Muscle weakness (generalized): Secondary | ICD-10-CM | POA: Diagnosis not present

## 2023-04-07 DIAGNOSIS — I10 Essential (primary) hypertension: Secondary | ICD-10-CM | POA: Diagnosis not present

## 2023-04-07 DIAGNOSIS — R2681 Unsteadiness on feet: Secondary | ICD-10-CM | POA: Diagnosis not present

## 2023-04-07 DIAGNOSIS — Z96651 Presence of right artificial knee joint: Secondary | ICD-10-CM | POA: Diagnosis not present

## 2023-04-07 DIAGNOSIS — K582 Mixed irritable bowel syndrome: Secondary | ICD-10-CM | POA: Diagnosis not present

## 2023-04-07 DIAGNOSIS — R2689 Other abnormalities of gait and mobility: Secondary | ICD-10-CM | POA: Diagnosis not present

## 2023-04-07 DIAGNOSIS — Z471 Aftercare following joint replacement surgery: Secondary | ICD-10-CM | POA: Diagnosis not present

## 2023-04-07 DIAGNOSIS — M150 Primary generalized (osteo)arthritis: Secondary | ICD-10-CM | POA: Diagnosis not present

## 2023-04-09 ENCOUNTER — Other Ambulatory Visit: Payer: Self-pay | Admitting: Family

## 2023-04-09 DIAGNOSIS — M150 Primary generalized (osteo)arthritis: Secondary | ICD-10-CM | POA: Diagnosis not present

## 2023-04-09 DIAGNOSIS — I10 Essential (primary) hypertension: Secondary | ICD-10-CM | POA: Diagnosis not present

## 2023-04-09 DIAGNOSIS — K582 Mixed irritable bowel syndrome: Secondary | ICD-10-CM | POA: Diagnosis not present

## 2023-04-11 DIAGNOSIS — Z9181 History of falling: Secondary | ICD-10-CM | POA: Diagnosis not present

## 2023-04-11 DIAGNOSIS — Z471 Aftercare following joint replacement surgery: Secondary | ICD-10-CM | POA: Diagnosis not present

## 2023-04-11 DIAGNOSIS — E559 Vitamin D deficiency, unspecified: Secondary | ICD-10-CM | POA: Diagnosis not present

## 2023-04-11 DIAGNOSIS — R809 Proteinuria, unspecified: Secondary | ICD-10-CM | POA: Diagnosis not present

## 2023-04-11 DIAGNOSIS — M51369 Other intervertebral disc degeneration, lumbar region without mention of lumbar back pain or lower extremity pain: Secondary | ICD-10-CM | POA: Diagnosis not present

## 2023-04-11 DIAGNOSIS — I10 Essential (primary) hypertension: Secondary | ICD-10-CM | POA: Diagnosis not present

## 2023-04-11 DIAGNOSIS — M797 Fibromyalgia: Secondary | ICD-10-CM | POA: Diagnosis not present

## 2023-04-11 DIAGNOSIS — K219 Gastro-esophageal reflux disease without esophagitis: Secondary | ICD-10-CM | POA: Diagnosis not present

## 2023-04-11 DIAGNOSIS — Z7985 Long-term (current) use of injectable non-insulin antidiabetic drugs: Secondary | ICD-10-CM | POA: Diagnosis not present

## 2023-04-11 DIAGNOSIS — Z96651 Presence of right artificial knee joint: Secondary | ICD-10-CM | POA: Diagnosis not present

## 2023-04-11 DIAGNOSIS — K760 Fatty (change of) liver, not elsewhere classified: Secondary | ICD-10-CM | POA: Diagnosis not present

## 2023-04-11 DIAGNOSIS — R011 Cardiac murmur, unspecified: Secondary | ICD-10-CM | POA: Diagnosis not present

## 2023-04-11 DIAGNOSIS — K7581 Nonalcoholic steatohepatitis (NASH): Secondary | ICD-10-CM | POA: Diagnosis not present

## 2023-04-11 DIAGNOSIS — K589 Irritable bowel syndrome without diarrhea: Secondary | ICD-10-CM | POA: Diagnosis not present

## 2023-04-11 DIAGNOSIS — E1129 Type 2 diabetes mellitus with other diabetic kidney complication: Secondary | ICD-10-CM | POA: Diagnosis not present

## 2023-04-11 DIAGNOSIS — Z7982 Long term (current) use of aspirin: Secondary | ICD-10-CM | POA: Diagnosis not present

## 2023-04-11 DIAGNOSIS — Z87891 Personal history of nicotine dependence: Secondary | ICD-10-CM | POA: Diagnosis not present

## 2023-04-13 ENCOUNTER — Other Ambulatory Visit: Payer: Self-pay | Admitting: Family

## 2023-04-14 DIAGNOSIS — R011 Cardiac murmur, unspecified: Secondary | ICD-10-CM | POA: Diagnosis not present

## 2023-04-14 DIAGNOSIS — K7581 Nonalcoholic steatohepatitis (NASH): Secondary | ICD-10-CM | POA: Diagnosis not present

## 2023-04-14 DIAGNOSIS — M51369 Other intervertebral disc degeneration, lumbar region without mention of lumbar back pain or lower extremity pain: Secondary | ICD-10-CM | POA: Diagnosis not present

## 2023-04-14 DIAGNOSIS — E1129 Type 2 diabetes mellitus with other diabetic kidney complication: Secondary | ICD-10-CM | POA: Diagnosis not present

## 2023-04-14 DIAGNOSIS — I10 Essential (primary) hypertension: Secondary | ICD-10-CM | POA: Diagnosis not present

## 2023-04-14 DIAGNOSIS — Z471 Aftercare following joint replacement surgery: Secondary | ICD-10-CM | POA: Diagnosis not present

## 2023-04-14 DIAGNOSIS — Z7985 Long-term (current) use of injectable non-insulin antidiabetic drugs: Secondary | ICD-10-CM | POA: Diagnosis not present

## 2023-04-14 DIAGNOSIS — M797 Fibromyalgia: Secondary | ICD-10-CM | POA: Diagnosis not present

## 2023-04-14 DIAGNOSIS — Z96651 Presence of right artificial knee joint: Secondary | ICD-10-CM | POA: Diagnosis not present

## 2023-04-14 DIAGNOSIS — Z9181 History of falling: Secondary | ICD-10-CM | POA: Diagnosis not present

## 2023-04-14 DIAGNOSIS — K760 Fatty (change of) liver, not elsewhere classified: Secondary | ICD-10-CM | POA: Diagnosis not present

## 2023-04-14 DIAGNOSIS — E559 Vitamin D deficiency, unspecified: Secondary | ICD-10-CM | POA: Diagnosis not present

## 2023-04-14 DIAGNOSIS — Z87891 Personal history of nicotine dependence: Secondary | ICD-10-CM | POA: Diagnosis not present

## 2023-04-14 DIAGNOSIS — K219 Gastro-esophageal reflux disease without esophagitis: Secondary | ICD-10-CM | POA: Diagnosis not present

## 2023-04-14 DIAGNOSIS — K589 Irritable bowel syndrome without diarrhea: Secondary | ICD-10-CM | POA: Diagnosis not present

## 2023-04-14 DIAGNOSIS — R809 Proteinuria, unspecified: Secondary | ICD-10-CM | POA: Diagnosis not present

## 2023-04-14 DIAGNOSIS — Z7982 Long term (current) use of aspirin: Secondary | ICD-10-CM | POA: Diagnosis not present

## 2023-04-15 DIAGNOSIS — M797 Fibromyalgia: Secondary | ICD-10-CM | POA: Diagnosis not present

## 2023-04-15 DIAGNOSIS — E1129 Type 2 diabetes mellitus with other diabetic kidney complication: Secondary | ICD-10-CM | POA: Diagnosis not present

## 2023-04-15 DIAGNOSIS — K7581 Nonalcoholic steatohepatitis (NASH): Secondary | ICD-10-CM | POA: Diagnosis not present

## 2023-04-15 DIAGNOSIS — R809 Proteinuria, unspecified: Secondary | ICD-10-CM | POA: Diagnosis not present

## 2023-04-15 DIAGNOSIS — Z7982 Long term (current) use of aspirin: Secondary | ICD-10-CM | POA: Diagnosis not present

## 2023-04-15 DIAGNOSIS — Z471 Aftercare following joint replacement surgery: Secondary | ICD-10-CM | POA: Diagnosis not present

## 2023-04-15 DIAGNOSIS — I10 Essential (primary) hypertension: Secondary | ICD-10-CM | POA: Diagnosis not present

## 2023-04-15 DIAGNOSIS — M51369 Other intervertebral disc degeneration, lumbar region without mention of lumbar back pain or lower extremity pain: Secondary | ICD-10-CM | POA: Diagnosis not present

## 2023-04-15 DIAGNOSIS — K219 Gastro-esophageal reflux disease without esophagitis: Secondary | ICD-10-CM | POA: Diagnosis not present

## 2023-04-15 DIAGNOSIS — R011 Cardiac murmur, unspecified: Secondary | ICD-10-CM | POA: Diagnosis not present

## 2023-04-15 DIAGNOSIS — Z7985 Long-term (current) use of injectable non-insulin antidiabetic drugs: Secondary | ICD-10-CM | POA: Diagnosis not present

## 2023-04-15 DIAGNOSIS — K589 Irritable bowel syndrome without diarrhea: Secondary | ICD-10-CM | POA: Diagnosis not present

## 2023-04-15 DIAGNOSIS — E559 Vitamin D deficiency, unspecified: Secondary | ICD-10-CM | POA: Diagnosis not present

## 2023-04-15 DIAGNOSIS — Z87891 Personal history of nicotine dependence: Secondary | ICD-10-CM | POA: Diagnosis not present

## 2023-04-15 DIAGNOSIS — Z96651 Presence of right artificial knee joint: Secondary | ICD-10-CM | POA: Diagnosis not present

## 2023-04-15 DIAGNOSIS — K760 Fatty (change of) liver, not elsewhere classified: Secondary | ICD-10-CM | POA: Diagnosis not present

## 2023-04-15 DIAGNOSIS — Z9181 History of falling: Secondary | ICD-10-CM | POA: Diagnosis not present

## 2023-04-17 DIAGNOSIS — I10 Essential (primary) hypertension: Secondary | ICD-10-CM | POA: Diagnosis not present

## 2023-04-17 DIAGNOSIS — R011 Cardiac murmur, unspecified: Secondary | ICD-10-CM | POA: Diagnosis not present

## 2023-04-17 DIAGNOSIS — Z7985 Long-term (current) use of injectable non-insulin antidiabetic drugs: Secondary | ICD-10-CM | POA: Diagnosis not present

## 2023-04-17 DIAGNOSIS — R809 Proteinuria, unspecified: Secondary | ICD-10-CM | POA: Diagnosis not present

## 2023-04-17 DIAGNOSIS — E1129 Type 2 diabetes mellitus with other diabetic kidney complication: Secondary | ICD-10-CM | POA: Diagnosis not present

## 2023-04-17 DIAGNOSIS — Z7982 Long term (current) use of aspirin: Secondary | ICD-10-CM | POA: Diagnosis not present

## 2023-04-17 DIAGNOSIS — M51369 Other intervertebral disc degeneration, lumbar region without mention of lumbar back pain or lower extremity pain: Secondary | ICD-10-CM | POA: Diagnosis not present

## 2023-04-17 DIAGNOSIS — K589 Irritable bowel syndrome without diarrhea: Secondary | ICD-10-CM | POA: Diagnosis not present

## 2023-04-17 DIAGNOSIS — K219 Gastro-esophageal reflux disease without esophagitis: Secondary | ICD-10-CM | POA: Diagnosis not present

## 2023-04-17 DIAGNOSIS — M797 Fibromyalgia: Secondary | ICD-10-CM | POA: Diagnosis not present

## 2023-04-17 DIAGNOSIS — Z9181 History of falling: Secondary | ICD-10-CM | POA: Diagnosis not present

## 2023-04-17 DIAGNOSIS — E559 Vitamin D deficiency, unspecified: Secondary | ICD-10-CM | POA: Diagnosis not present

## 2023-04-17 DIAGNOSIS — Z96651 Presence of right artificial knee joint: Secondary | ICD-10-CM | POA: Diagnosis not present

## 2023-04-17 DIAGNOSIS — Z471 Aftercare following joint replacement surgery: Secondary | ICD-10-CM | POA: Diagnosis not present

## 2023-04-17 DIAGNOSIS — Z87891 Personal history of nicotine dependence: Secondary | ICD-10-CM | POA: Diagnosis not present

## 2023-04-17 DIAGNOSIS — K7581 Nonalcoholic steatohepatitis (NASH): Secondary | ICD-10-CM | POA: Diagnosis not present

## 2023-04-17 DIAGNOSIS — K760 Fatty (change of) liver, not elsewhere classified: Secondary | ICD-10-CM | POA: Diagnosis not present

## 2023-04-18 ENCOUNTER — Ambulatory Visit (INDEPENDENT_AMBULATORY_CARE_PROVIDER_SITE_OTHER): Payer: Medicare Other | Admitting: Family

## 2023-04-18 ENCOUNTER — Encounter: Payer: Self-pay | Admitting: Family

## 2023-04-18 VITALS — BP 111/51 | HR 87 | Temp 98.8°F | Resp 16 | Ht 68.0 in | Wt 215.0 lb

## 2023-04-18 DIAGNOSIS — D649 Anemia, unspecified: Secondary | ICD-10-CM

## 2023-04-18 DIAGNOSIS — R2241 Localized swelling, mass and lump, right lower limb: Secondary | ICD-10-CM | POA: Diagnosis not present

## 2023-04-18 DIAGNOSIS — K219 Gastro-esophageal reflux disease without esophagitis: Secondary | ICD-10-CM

## 2023-04-18 DIAGNOSIS — F419 Anxiety disorder, unspecified: Secondary | ICD-10-CM | POA: Diagnosis not present

## 2023-04-18 DIAGNOSIS — R809 Proteinuria, unspecified: Secondary | ICD-10-CM | POA: Diagnosis not present

## 2023-04-18 DIAGNOSIS — E785 Hyperlipidemia, unspecified: Secondary | ICD-10-CM | POA: Diagnosis not present

## 2023-04-18 DIAGNOSIS — E1129 Type 2 diabetes mellitus with other diabetic kidney complication: Secondary | ICD-10-CM | POA: Diagnosis not present

## 2023-04-18 DIAGNOSIS — T148XXA Other injury of unspecified body region, initial encounter: Secondary | ICD-10-CM | POA: Insufficient documentation

## 2023-04-18 DIAGNOSIS — M179 Osteoarthritis of knee, unspecified: Secondary | ICD-10-CM

## 2023-04-18 DIAGNOSIS — Z7985 Long-term (current) use of injectable non-insulin antidiabetic drugs: Secondary | ICD-10-CM | POA: Diagnosis not present

## 2023-04-18 DIAGNOSIS — I10 Essential (primary) hypertension: Secondary | ICD-10-CM

## 2023-04-18 DIAGNOSIS — F32A Depression, unspecified: Secondary | ICD-10-CM

## 2023-04-18 DIAGNOSIS — R5383 Other fatigue: Secondary | ICD-10-CM

## 2023-04-18 DIAGNOSIS — K581 Irritable bowel syndrome with constipation: Secondary | ICD-10-CM

## 2023-04-18 LAB — CBC WITH DIFFERENTIAL/PLATELET
Basophils Absolute: 0 10*3/uL (ref 0.0–0.1)
Basophils Relative: 0.5 % (ref 0.0–3.0)
Eosinophils Absolute: 0.3 10*3/uL (ref 0.0–0.7)
Eosinophils Relative: 5.3 % — ABNORMAL HIGH (ref 0.0–5.0)
HCT: 35.7 % — ABNORMAL LOW (ref 36.0–46.0)
Hemoglobin: 11.9 g/dL — ABNORMAL LOW (ref 12.0–15.0)
Lymphocytes Relative: 38 % (ref 12.0–46.0)
Lymphs Abs: 1.8 10*3/uL (ref 0.7–4.0)
MCHC: 33.5 g/dL (ref 30.0–36.0)
MCV: 88.1 fL (ref 78.0–100.0)
Monocytes Absolute: 0.6 10*3/uL (ref 0.1–1.0)
Monocytes Relative: 12.3 % — ABNORMAL HIGH (ref 3.0–12.0)
Neutro Abs: 2.1 10*3/uL (ref 1.4–7.7)
Neutrophils Relative %: 43.9 % (ref 43.0–77.0)
Platelets: 308 10*3/uL (ref 150.0–400.0)
RBC: 4.05 Mil/uL (ref 3.87–5.11)
RDW: 15.7 % — ABNORMAL HIGH (ref 11.5–15.5)
WBC: 4.7 10*3/uL (ref 4.0–10.5)

## 2023-04-18 LAB — MICROALBUMIN / CREATININE URINE RATIO
Creatinine,U: 439.2 mg/dL
Microalb Creat Ratio: 0.9 mg/g (ref 0.0–30.0)
Microalb, Ur: 4.1 mg/dL — ABNORMAL HIGH (ref 0.0–1.9)

## 2023-04-18 LAB — BASIC METABOLIC PANEL
BUN: 15 mg/dL (ref 6–23)
CO2: 31 meq/L (ref 19–32)
Calcium: 9.8 mg/dL (ref 8.4–10.5)
Chloride: 100 meq/L (ref 96–112)
Creatinine, Ser: 0.81 mg/dL (ref 0.40–1.20)
GFR: 74.61 mL/min (ref >60.00)
Glucose, Bld: 165 mg/dL — ABNORMAL HIGH (ref 70–99)
Potassium: 3.7 meq/L (ref 3.5–5.1)
Sodium: 141 meq/L (ref 135–145)

## 2023-04-18 LAB — TSH: TSH: 0.27 u[IU]/mL — ABNORMAL LOW (ref 0.35–5.50)

## 2023-04-18 LAB — HEMOGLOBIN A1C: Hgb A1c MFr Bld: 5.9 % (ref 4.6–6.5)

## 2023-04-18 MED ORDER — TIRZEPATIDE 7.5 MG/0.5ML ~~LOC~~ SOAJ: 7.5000 mg | SUBCUTANEOUS | 1 refills | Status: DC

## 2023-04-18 NOTE — Assessment & Plan Note (Signed)
Swelling right leg most consistent with hematoma.  Reassurance provided.

## 2023-04-18 NOTE — Assessment & Plan Note (Signed)
Unable to afford livalo,did not tolerate other statins. Will refer to pharmacist to help with pt assistance.

## 2023-04-18 NOTE — Progress Notes (Signed)
Subjective:     Patient ID: [name], female    DOB: 31-Oct-1955, 67 y.o.   MRN: [id number]  Chief Complaint  Patient presents with   Follow-up    Follow up after rehab, post knee surgery 03/21/23    HPI  Discussed the use of AI scribe software for clinical note transcription with the patient, who gave verbal consent to proceed.  History of Present Illness   The patient, with a history of diabetes, hypertension, hyperlipidemia, and gastroesophageal reflux disease, presents for a follow-up after recent right knee replacement surgery on 03/21/23. She was discharged to [facility] prior to returning home. She reports daily improvement in her knee pain, but it remains significant. She is using meloxicam for the pain and trying to avoid using the oxycodone.   The patient expresses concerns about the cost of her medications, specifically Linzess and Livalo, which are in the higher tier of her insurance and unaffordable. She has not been taking the Livalo and has sought alternatives from a health food store.  Additionally, the patient reports a "painful knot" on her right leg near the ankle, which has been present since a fall a week before her surgery. She has been managing her diabetes with Mounjaro 5 mg, but reports constant constipation, a symptom present even before starting the medication. She also expresses a significant decrease in energy levels since the surgery, often falling asleep during the day and going to bed early in the afternoon.  The patient is also concerned about her weight and is actively trying to lose more. She reports an improvement in her reflux symptoms, which she manages with omeprazole.        Health Maintenance Due  Topic Date Due   DTaP/Tdap/Td (2 - Td or Tdap) 11/07/2021   COVID-19 Vaccine (6 - 2023-24 season) 02/02/2023   Diabetic kidney evaluation - Urine ACR  04/30/2023    Past Medical History:  Diagnosis Date   Allergy     SEASONAL   Arthritis    Back pain    Benign fundic gland polyps of stomach    Cataract    EARL STAGES   Constipation    COVID-19 09/04/2022   Esophagitis    LA Class B   Family history of adverse reaction to anesthesia    Maternal aunt died on the table at age 81 while having a hysterectomy. only had hx of DM.   Fatigue    Fatty liver    Fibromyalgia    Gallbladder problem    GERD (gastroesophageal reflux disease)    Heart murmur    History of arthroscopy of right knee    Hyperlipidemia    Hypertension    IBS (irritable bowel syndrome)    Joint pain    Neuromuscular disorder (HCC)    Pneumonia    Pre-diabetes     Past Surgical History:  Procedure Laterality Date   CHOLECYSTECTOMY  2009   low GB EF, no gallstones   COLONOSCOPY     EYE SURGERY Bilateral    cataract surgery w/ IOL   knee arthroscopy right  04/22/2022   LAPAROSCOPY     of adhesions/ of ovaries   TOTAL KNEE ARTHROPLASTY Right 03/21/2023   Procedure: TOTAL KNEE ARTHROPLASTY;  Surgeon: Jene Every, MD;  Location: [facility];  Service: Orthopedics;  Laterality: Right;  150 min   UPPER GASTROINTESTINAL ENDOSCOPY     VAGINAL HYSTERECTOMY      Family History  Problem Relation  Age of Onset   Heart disease Mother    Hypertension Mother    Hyperlipidemia Mother    Obesity Mother    Colon cancer Father    Hypertension Father    Hyperlipidemia Father    Obesity Father    Colon polyps Sister    Diabetes Sister    Diabetes Brother        Half   Heart disease Brother    Hypertension Brother    Sickle cell anemia Maternal Grandmother    Breast cancer Paternal Grandmother    Sickle cell trait Son    Esophageal cancer Neg Hx    Stomach cancer Neg Hx    Rectal cancer Neg Hx    Crohn's disease Neg Hx    Ulcerative colitis Neg Hx     Social History   Socioeconomic History   Marital status: Single    Spouse name: Not on file   Number of children: Not on file   Years of education: Not on file    Highest education level: Bachelor's degree (e.g., BA, AB, BS)  Occupational History   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  Tobacco Use   Smoking status: Former    Current packs/day: 0.00    Average packs/day: 0.1 packs/day for 10.0 years (1.0 ttl pk-yrs)    Types: Cigarettes    Start date: 07/15/1971    Quit date: 07/14/1981    Years since quitting: 41.7    Passive exposure: Never   Smokeless tobacco: Never  Vaping Use   Vaping status: Never Used  Substance and Sexual Activity   Alcohol use: No   Drug use: No   Sexual activity: Not Currently  Other Topics Concern   Not on file  Social History Narrative   Widowed- husband passed last year   2 grown sons, grandchildren   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, Ship broker- retired   No pets   Enjoys writing, reading, being a grandmother, walking   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   Completed college   Social Determinants of Health   Financial Resource Strain: Low Risk  (04/17/2023)   Overall Financial Resource Strain (CARDIA)    Difficulty of Paying Living Expenses: Not hard at all  Food Insecurity: No Food Insecurity (04/17/2023)   Hunger Vital Sign    Worried About Running Out of Food in the Last Year: Never true    Ran Out of Food in the Last Year: Never true  Transportation Needs: No Transportation Needs (04/17/2023)   PRAPARE - Administrator, Civil Service (Medical): No    Lack of Transportation (Non-Medical): No  Physical Activity: Insufficiently Active (04/17/2023)   Exercise Vital Sign    Days of Exercise per Week: 2 days    Minutes of Exercise per Session: 10 min  Stress: No Stress Concern Present (04/17/2023)   [employer] of Occupational Health - Occupational Stress Questionnaire    Feeling of Stress : Not at all  Social Connections: Moderately Integrated (04/17/2023)   Social Connection and Isolation Panel [NHANES]    Frequency of Communication with  Friends and Family: More than three times a week    Frequency of Social Gatherings with Friends and Family: Once a week    Attends Religious Services: More than 4 times per year    Active Member of [employer] or Organizations: Yes    Attends Banker Meetings: More than 4 times per year  Marital Status: Widowed  Intimate Partner Violence: Not At Risk (03/21/2023)   Humiliation, Afraid, Rape, and Kick questionnaire    Fear of Current or Ex-Partner: No    Emotionally Abused: No    Physically Abused: No    Sexually Abused: No    Outpatient Medications Prior to Visit  Medication Sig Dispense Refill   Ascorbic Acid (VITAMIN C) 100 MG tablet Take 100 mg by mouth daily.     aspirin EC 81 MG tablet Take 1 tablet (81 mg total) by mouth 2 (two) times daily after a meal. Day after surgery 60 tablet 1   betamethasone valerate ointment (VALISONE) 0.1 % Apply 1 Application topically 2 (two) times daily. (Patient taking differently: Apply 1 Application topically 2 (two) times daily as needed (irritation).) 30 g 0   Calcium Carbonate (CALTRATE 600 PO) Take 600 mg by mouth daily.     cholecalciferol (VITAMIN D3) 25 MCG (1000 UNIT) tablet Take 1,000 Units by mouth daily.     docusate sodium (COLACE) 100 MG capsule Take 1 capsule (100 mg total) by mouth 2 (two) times daily. 60 capsule 2   fexofenadine (ALLEGRA) 180 MG tablet Take 180 mg by mouth daily as needed for allergies.     hydrochlorothiazide (HYDRODIURIL) 25 MG tablet TAKE 1 TABLET(25 MG) BY MOUTH DAILY 90 tablet 1   linaclotide (LINZESS) 290 MCG CAPS capsule TAKE 1 CAPSULE(290 MCG) BY MOUTH DAILY BEFORE BREAKFAST 90 capsule 1   methocarbamol (ROBAXIN) 500 MG tablet Take 1 tablet (500 mg total) by mouth every 8 (eight) hours as needed for muscle spasms. 30 tablet 1   Multiple Vitamin (MULTIVITAMIN WITH MINERALS) TABS tablet Take 1 tablet by mouth daily.     omeprazole (PRILOSEC) 40 MG capsule TAKE 1 CAPSULE(40 MG) BY MOUTH DAILY AS NEEDED 90  capsule 1   oxyCODONE (OXY IR/ROXICODONE) 5 MG immediate release tablet Take 1 tablet (5 mg total) by mouth every 4 (four) hours as needed for severe pain (pain score 7-10). 40 tablet 0   Plant Sterol Stanol-Pantethine (CHOLEST OFF COMPLETE PO) Take 1 capsule by mouth daily.     polyethylene glycol (MIRALAX / GLYCOLAX) 17 g packet Take 17 g by mouth daily. 14 each 0   Probiotic Product (PROBIOTIC DAILY PO) Take 1 capsule by mouth daily.     senna (SENOKOT) 8.6 MG tablet Take 5 tablets by mouth at bedtime.     timolol (TIMOPTIC) 0.5 % ophthalmic solution Place 1 drop into both eyes 2 (two) times daily.     tirzepatide [facility]) 5 MG/0.5ML Pen Inject 5 mg into the skin once a week. 6 mL 2   No facility-administered medications prior to visit.    Allergies  Allergen Reactions   Metformin And Related     abd pain   Rosuvastatin Other (See Comments)    Muscle aches    ROS See HPI    Objective:    Physical Exam Constitutional:      General: She is not in acute distress.    Appearance: Normal appearance. She is well-developed.  HENT:     Head: Normocephalic and atraumatic.     Right Ear: External ear normal.     Left Ear: External ear normal.  Eyes:     General: No scleral icterus. Neck:     Thyroid: No thyromegaly.  Cardiovascular:     Rate and Rhythm: Normal rate and regular rhythm.     Heart sounds: Normal heart sounds. No murmur heard. Pulmonary:  Effort: Pulmonary effort is normal. No respiratory distress.     Breath sounds: Normal breath sounds. No wheezing.  Musculoskeletal:     Cervical back: Neck supple.     Comments: Tender swelling above right medial ankle on lower leg, no warmth  Skin:    General: Skin is warm and dry.  Neurological:     Mental Status: She is alert and oriented to person, place, and time.  Psychiatric:        Mood and Affect: Mood normal.        Behavior: Behavior normal.        Thought Content: Thought content normal.        Judgment:  Judgment normal.      BP (!) 111/51 (BP Location: Right Arm, Patient Position: Sitting, Cuff Size: Large)   Pulse 87   Temp 98.8 F (37.1 C) (Oral)   Resp 16   Ht 5\' 8"  (1.727 m)   Wt 215 lb (97.5 kg)   SpO2 100%   BMI 32.69 kg/m  Wt Readings from Last 3 Encounters:  04/18/23 215 lb (97.5 kg)  03/21/23 221 lb (100.2 kg)  03/12/23 221 lb (100.2 kg)       Assessment & Plan:   Problem List Items Addressed This Visit       Unprioritized   IBS (irritable bowel syndrome)    Chronic constipation.  Unable to afford Linzess, will ask Pharmacist to see if she qualifies for patient assistance.      Relevant Orders   AMB Referral [facility] Care Management   HYPERTENSION, BENIGN ESSENTIAL    BP Readings from Last 3 Encounters:  04/18/23 (!) 111/51  03/25/23 (!) 142/70  03/12/23 121/63   BP stable on hydrochlorothiazide, continue same.       Relevant Orders   Basic Metabolic Panel (BMET)   Hyperlipidemia with target LDL less than 100    Unable to afford livalo,did not tolerate other statins. Will refer to pharmacist to help with pt assistance.       Relevant Orders   AMB Referral [facility] Care Management   Hematoma    Swelling right leg most consistent with hematoma.  Reassurance provided.       GERD    Stable on omeprazole.       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       Controlled type 2 diabetes mellitus with microalbuminuria, without long-term current use of insulin (HCC) - Primary    Wt Readings from Last 3 Encounters:  04/18/23 215 lb (97.5 kg)  03/21/23 221 lb (100.2 kg)  03/12/23 221 lb (100.2 kg)   Lab Results  Component Value Date   HGBA1C 6.3 12/25/2022   HGBA1C 7.0 (H) 08/27/2022   HGBA1C 7.2 (H) 04/29/2022   Lab Results  Component Value Date   MICROALBUR 1.0 04/29/2022   LDLCALC 130 (H) 08/27/2022   CREATININE 0.66 03/24/2023   She is not having significant appetite suppression on  mounjaro 5mg .  Will increase to 7.5mg  to see if she has better weight loss effects on this dose.  She will let me know if her constipation issues worsen.       Relevant Medications   tirzepatide (MOUNJARO) 7.5 MG/0.5ML Pen   Other Relevant Orders   HgB A1c   Urine Microalbumin w/creat. ratio   Anxiety and depression    Reports good mood. Not currently on antidepressant medication. Monitor.  Other Visit Diagnoses     Fatigue, unspecified type       Relevant Orders   TSH   Anemia, unspecified type       Relevant Orders   CBC w/Diff       I have discontinued Isolina Orten. Kurkowski's tirzepatide. I am also having her start on tirzepatide. Additionally, I am having her maintain her fexofenadine, cholecalciferol, vitamin C, multivitamin with minerals, timolol, senna, betamethasone valerate ointment, omeprazole, Plant Sterol Stanol-Pantethine (CHOLEST OFF COMPLETE PO), Calcium Carbonate (CALTRATE 600 PO), Probiotic Product (PROBIOTIC DAILY PO), aspirin EC, oxyCODONE, docusate sodium, polyethylene glycol, methocarbamol, Linzess, and hydrochlorothiazide.  Meds ordered this encounter  Medications   tirzepatide (MOUNJARO) 7.5 MG/0.5ML Pen    Sig: Inject 7.5 mg into the skin once a week.    Dispense:  6 mL    Refill:  1    Order Specific Question:   Supervising Provider    Answer:   Danise Edge A [[id number]]

## 2023-04-18 NOTE — Assessment & Plan Note (Signed)
Stable on omeprazole. 

## 2023-04-18 NOTE — Assessment & Plan Note (Signed)
Chronic constipation.  Unable to afford Linzess, will ask Pharmacist to see if she qualifies for patient assistance.

## 2023-04-18 NOTE — Assessment & Plan Note (Signed)
S/p R TKA, progressing with physical therapy.

## 2023-04-18 NOTE — Assessment & Plan Note (Addendum)
Reports good mood. Not currently on antidepressant medication. Monitor.

## 2023-04-18 NOTE — Assessment & Plan Note (Signed)
BP Readings from Last 3 Encounters:  04/18/23 (!) 111/51  03/25/23 (!) 142/70  03/12/23 121/63   BP stable on hydrochlorothiazide, continue same.

## 2023-04-18 NOTE — Assessment & Plan Note (Addendum)
Wt Readings from Last 3 Encounters:  04/18/23 215 lb (97.5 kg)  03/21/23 221 lb (100.2 kg)  03/12/23 221 lb (100.2 kg)   Lab Results  Component Value Date   HGBA1C 6.3 12/25/2022   HGBA1C 7.0 (H) 08/27/2022   HGBA1C 7.2 (H) 04/29/2022   Lab Results  Component Value Date   MICROALBUR 1.0 04/29/2022   LDLCALC 130 (H) 08/27/2022   CREATININE 0.66 03/24/2023   She is not having significant appetite suppression on mounjaro 5mg .  Will increase to 7.5mg  to see if she has better weight loss effects on this dose.  She will let me know if her constipation issues worsen.

## 2023-04-18 NOTE — Patient Instructions (Signed)
VISIT SUMMARY:  You had a follow-up appointment today to discuss your recent knee replacement surgery and other ongoing health concerns. You reported daily improvement in your knee pain, but it remains significant. We also discussed your concerns about the cost of your medications, a painful knot on your right leg, and your ongoing issues with constipation and low energy levels. Additionally, we reviewed your diabetes, hypertension, hyperlipidemia, and gastroesophageal reflux disease management.  YOUR PLAN:  -POST-OPERATIVE KNEE REPLACEMENT: You are experiencing significant pain in your knee following your recent surgery, but it is improving daily. You may add extra strength tylenol to your meloxicam as needed. You also have a painful knot on your right leg near the ankle, which appeared after a fall before your surgery. The knot appears to be a hematoma which is a collection of blood in the tissue following injury. This should continue to improve in the coming months.   -HYPERLIPIDEMIA: Hyperlipidemia means you have high levels of fats in your blood, which can increase your risk of heart disease. You mentioned that you cannot afford your current medication, Livalo. We will refer you to a pharmacist to help you apply for patient assistance to make this medication more affordable.  -CHRONIC CONSTIPATION: Chronic constipation is when you have infrequent or difficult bowel movements. You are currently not taking Linzess due to its high cost. We will refer you to a pharmacist to help you apply for patient assistance for this medication.  -TYPE 2 DIABETES: Type 2 Diabetes is a condition where your body does not use insulin properly, leading to high blood sugar levels. Your A1C has improved to 6.3, which is good. We will repeat your A1C test and increase your Mounjaro dose to 7.5mg  while monitoring for any worsening constipation.  -HYPERTENSION: Hypertension is high blood pressure. Your blood pressure is  well controlled with Hydrochlorothiazide, so we will continue this medication.  -GASTROESOPHAGEAL REFLUX DISEASE: Gastroesophageal Reflux Disease (GERD) is when stomach acid frequently flows back into the tube connecting your mouth and stomach. Your symptoms are well controlled with Omeprazole, so we will continue this medication.  -GENERAL HEALTH MAINTENANCE: We will check your hemoglobin and thyroid function due to your reported low energy levels. We also encourage you to get the COVID-19 vaccine at a pharmacy. Please check back in three months for a follow-up.  INSTRUCTIONS:  Please follow up in three months. In the meantime, get your hemoglobin and thyroid function checked, and consider getting the COVID-19 vaccine at a pharmacy. We will also repeat your A1C test and monitor your response to the increased dose of Mounjaro.

## 2023-04-18 NOTE — Assessment & Plan Note (Deleted)
Chronic constipation.  Unable to afford Linzess, will ask Pharmacist to see if she qualifies for patient assistance.

## 2023-04-19 ENCOUNTER — Telehealth: Payer: Self-pay | Admitting: Family

## 2023-04-19 DIAGNOSIS — E0590 Thyrotoxicosis, unspecified without thyrotoxic crisis or storm: Secondary | ICD-10-CM

## 2023-04-19 MED ORDER — LISINOPRIL 10 MG PO TABS: 10.0000 mg | ORAL_TABLET | Freq: Every day | ORAL | 1 refills | Status: DC

## 2023-04-19 NOTE — Telephone Encounter (Signed)
Please advise patient that I reviewed her lab work and sugar is improved.There is some protein in her urine.  I would like her to stop hydrochlorothiazide and instead begin lisinopril 10mg  once daily. This will help with her blood pressure and kidney protection.  Sugar looks great.  Anemia is improving.  Her thyroid testing is very mildly overactive.  Let's have her return for lab work to recheck her thyroid in 6 weeks.  Nurse visit for BP recheck and bmet in 1 week. Dx HTN.

## 2023-04-21 ENCOUNTER — Encounter: Payer: Self-pay | Admitting: Family

## 2023-04-21 DIAGNOSIS — Z7985 Long-term (current) use of injectable non-insulin antidiabetic drugs: Secondary | ICD-10-CM | POA: Diagnosis not present

## 2023-04-21 DIAGNOSIS — Z7982 Long term (current) use of aspirin: Secondary | ICD-10-CM | POA: Diagnosis not present

## 2023-04-21 DIAGNOSIS — Z87891 Personal history of nicotine dependence: Secondary | ICD-10-CM | POA: Diagnosis not present

## 2023-04-21 DIAGNOSIS — E559 Vitamin D deficiency, unspecified: Secondary | ICD-10-CM | POA: Diagnosis not present

## 2023-04-21 DIAGNOSIS — E1129 Type 2 diabetes mellitus with other diabetic kidney complication: Secondary | ICD-10-CM | POA: Diagnosis not present

## 2023-04-21 DIAGNOSIS — M797 Fibromyalgia: Secondary | ICD-10-CM | POA: Diagnosis not present

## 2023-04-21 DIAGNOSIS — Z96651 Presence of right artificial knee joint: Secondary | ICD-10-CM | POA: Diagnosis not present

## 2023-04-21 DIAGNOSIS — K589 Irritable bowel syndrome without diarrhea: Secondary | ICD-10-CM | POA: Diagnosis not present

## 2023-04-21 DIAGNOSIS — R011 Cardiac murmur, unspecified: Secondary | ICD-10-CM | POA: Diagnosis not present

## 2023-04-21 DIAGNOSIS — K7581 Nonalcoholic steatohepatitis (NASH): Secondary | ICD-10-CM | POA: Diagnosis not present

## 2023-04-21 DIAGNOSIS — Z471 Aftercare following joint replacement surgery: Secondary | ICD-10-CM | POA: Diagnosis not present

## 2023-04-21 DIAGNOSIS — I10 Essential (primary) hypertension: Secondary | ICD-10-CM | POA: Diagnosis not present

## 2023-04-21 DIAGNOSIS — Z9181 History of falling: Secondary | ICD-10-CM | POA: Diagnosis not present

## 2023-04-21 DIAGNOSIS — K760 Fatty (change of) liver, not elsewhere classified: Secondary | ICD-10-CM | POA: Diagnosis not present

## 2023-04-21 DIAGNOSIS — R809 Proteinuria, unspecified: Secondary | ICD-10-CM | POA: Diagnosis not present

## 2023-04-21 DIAGNOSIS — K219 Gastro-esophageal reflux disease without esophagitis: Secondary | ICD-10-CM | POA: Diagnosis not present

## 2023-04-21 DIAGNOSIS — M51369 Other intervertebral disc degeneration, lumbar region without mention of lumbar back pain or lower extremity pain: Secondary | ICD-10-CM | POA: Diagnosis not present

## 2023-04-21 NOTE — Telephone Encounter (Signed)
Patient notified of results, provider's comments, medication changes and scheduled for BP check with bmet in one week and TSH in 6 weeks.

## 2023-04-22 ENCOUNTER — Telehealth: Payer: Self-pay

## 2023-04-22 NOTE — Telephone Encounter (Signed)
PA initiated via Covermymeds; KEY; [id number]. Awaiting determination.

## 2023-04-22 NOTE — Telephone Encounter (Signed)
PA approved.   Request Reference Number: [id number]. MOUNJARO INJ 7.5/0.5 is approved through 06/03/2023. Your patient may now fill this prescription and it will be covered. Authorization Expiration Date: 06/03/2023

## 2023-04-22 NOTE — Progress Notes (Signed)
   Care Guide Note  04/22/2023 Name: [name] MRN: [id number] DOB: 1955/08/10  Referred by: Sandford Craze, NP Reason for referral : Care Coordination (Outreach to schedule with pharm d )   [name] is a 67 y.o. year old female who is a primary care patient of Sandford Craze, NP. [name] was referred to the pharmacist for assistance related to HLD.    Successful contact was made with the patient to discuss pharmacy services including being ready for the pharmacist to call at least 5 minutes before the scheduled appointment time, to have medication bottles and any blood sugar or blood pressure readings ready for review. The patient agreed to meet with the pharmacist via with the pharmacist via telephone visit on (date/time).  04/28/2023  Penne Lash , RMA     [place]  [facility] Guide  Direct Dial: [phone]  Website: [email or web address]

## 2023-04-23 DIAGNOSIS — I10 Essential (primary) hypertension: Secondary | ICD-10-CM | POA: Diagnosis not present

## 2023-04-23 DIAGNOSIS — K219 Gastro-esophageal reflux disease without esophagitis: Secondary | ICD-10-CM | POA: Diagnosis not present

## 2023-04-23 DIAGNOSIS — Z471 Aftercare following joint replacement surgery: Secondary | ICD-10-CM | POA: Diagnosis not present

## 2023-04-23 DIAGNOSIS — K7581 Nonalcoholic steatohepatitis (NASH): Secondary | ICD-10-CM | POA: Diagnosis not present

## 2023-04-23 DIAGNOSIS — Z7985 Long-term (current) use of injectable non-insulin antidiabetic drugs: Secondary | ICD-10-CM | POA: Diagnosis not present

## 2023-04-23 DIAGNOSIS — E1129 Type 2 diabetes mellitus with other diabetic kidney complication: Secondary | ICD-10-CM | POA: Diagnosis not present

## 2023-04-23 DIAGNOSIS — M797 Fibromyalgia: Secondary | ICD-10-CM | POA: Diagnosis not present

## 2023-04-23 DIAGNOSIS — E559 Vitamin D deficiency, unspecified: Secondary | ICD-10-CM | POA: Diagnosis not present

## 2023-04-23 DIAGNOSIS — M51369 Other intervertebral disc degeneration, lumbar region without mention of lumbar back pain or lower extremity pain: Secondary | ICD-10-CM | POA: Diagnosis not present

## 2023-04-23 DIAGNOSIS — Z9181 History of falling: Secondary | ICD-10-CM | POA: Diagnosis not present

## 2023-04-23 DIAGNOSIS — R809 Proteinuria, unspecified: Secondary | ICD-10-CM | POA: Diagnosis not present

## 2023-04-23 DIAGNOSIS — Z87891 Personal history of nicotine dependence: Secondary | ICD-10-CM | POA: Diagnosis not present

## 2023-04-23 DIAGNOSIS — Z96651 Presence of right artificial knee joint: Secondary | ICD-10-CM | POA: Diagnosis not present

## 2023-04-23 DIAGNOSIS — Z7982 Long term (current) use of aspirin: Secondary | ICD-10-CM | POA: Diagnosis not present

## 2023-04-23 DIAGNOSIS — R011 Cardiac murmur, unspecified: Secondary | ICD-10-CM | POA: Diagnosis not present

## 2023-04-23 DIAGNOSIS — K589 Irritable bowel syndrome without diarrhea: Secondary | ICD-10-CM | POA: Diagnosis not present

## 2023-04-23 DIAGNOSIS — K760 Fatty (change of) liver, not elsewhere classified: Secondary | ICD-10-CM | POA: Diagnosis not present

## 2023-04-28 ENCOUNTER — Telehealth: Payer: Self-pay | Admitting: Family

## 2023-04-28 ENCOUNTER — Ambulatory Visit (INDEPENDENT_AMBULATORY_CARE_PROVIDER_SITE_OTHER): Payer: Medicare Other | Admitting: Pharmacist

## 2023-04-28 DIAGNOSIS — Z471 Aftercare following joint replacement surgery: Secondary | ICD-10-CM | POA: Diagnosis not present

## 2023-04-28 DIAGNOSIS — E1129 Type 2 diabetes mellitus with other diabetic kidney complication: Secondary | ICD-10-CM | POA: Diagnosis not present

## 2023-04-28 DIAGNOSIS — Z9181 History of falling: Secondary | ICD-10-CM | POA: Diagnosis not present

## 2023-04-28 DIAGNOSIS — R809 Proteinuria, unspecified: Secondary | ICD-10-CM

## 2023-04-28 DIAGNOSIS — Z96651 Presence of right artificial knee joint: Secondary | ICD-10-CM | POA: Diagnosis not present

## 2023-04-28 DIAGNOSIS — Z7982 Long term (current) use of aspirin: Secondary | ICD-10-CM | POA: Diagnosis not present

## 2023-04-28 DIAGNOSIS — I10 Essential (primary) hypertension: Secondary | ICD-10-CM | POA: Diagnosis not present

## 2023-04-28 DIAGNOSIS — E559 Vitamin D deficiency, unspecified: Secondary | ICD-10-CM | POA: Diagnosis not present

## 2023-04-28 DIAGNOSIS — R011 Cardiac murmur, unspecified: Secondary | ICD-10-CM | POA: Diagnosis not present

## 2023-04-28 DIAGNOSIS — M51369 Other intervertebral disc degeneration, lumbar region without mention of lumbar back pain or lower extremity pain: Secondary | ICD-10-CM | POA: Diagnosis not present

## 2023-04-28 DIAGNOSIS — K7581 Nonalcoholic steatohepatitis (NASH): Secondary | ICD-10-CM | POA: Diagnosis not present

## 2023-04-28 DIAGNOSIS — K589 Irritable bowel syndrome without diarrhea: Secondary | ICD-10-CM | POA: Diagnosis not present

## 2023-04-28 DIAGNOSIS — K760 Fatty (change of) liver, not elsewhere classified: Secondary | ICD-10-CM | POA: Diagnosis not present

## 2023-04-28 DIAGNOSIS — Z87891 Personal history of nicotine dependence: Secondary | ICD-10-CM | POA: Diagnosis not present

## 2023-04-28 DIAGNOSIS — Z7985 Long-term (current) use of injectable non-insulin antidiabetic drugs: Secondary | ICD-10-CM | POA: Diagnosis not present

## 2023-04-28 DIAGNOSIS — K219 Gastro-esophageal reflux disease without esophagitis: Secondary | ICD-10-CM | POA: Diagnosis not present

## 2023-04-28 DIAGNOSIS — E785 Hyperlipidemia, unspecified: Secondary | ICD-10-CM

## 2023-04-28 DIAGNOSIS — M797 Fibromyalgia: Secondary | ICD-10-CM | POA: Diagnosis not present

## 2023-04-28 NOTE — Telephone Encounter (Signed)
Patient called to advise that the lisinopril is making her head hurt so she is not taking it. Please call her to discuss alternative.

## 2023-04-29 ENCOUNTER — Other Ambulatory Visit: Payer: Medicare Other

## 2023-04-29 ENCOUNTER — Ambulatory Visit: Payer: Medicare Other

## 2023-04-29 MED ORDER — LOSARTAN POTASSIUM 25 MG PO TABS
25.0000 mg | ORAL_TABLET | Freq: Every day | ORAL | 0 refills | Status: DC
Start: 2023-04-29 — End: 2023-07-31

## 2023-04-29 NOTE — Telephone Encounter (Signed)
Spoke to patient.  D/c lisinopril due to headache.  D/c hydrochlorothiazide, add losartan 25mg .  Keep upcoming appointment.

## 2023-04-29 NOTE — Addendum Note (Signed)
Addended by: Sandford Craze on: 04/29/2023 12:31 PM   Modules accepted: Orders

## 2023-04-30 ENCOUNTER — Encounter: Payer: Self-pay | Admitting: Pharmacist

## 2023-04-30 NOTE — Progress Notes (Signed)
04/28/2023 Name: [name] MRN: [id number] DOB: 1956-01-13  Chief Complaint  Patient presents with   Medication Management    Medication cost    [name] is a 67 y.o. year old female who presented for a telephone visit.   They were referred to the pharmacist by their PCP for assistance in managing medication access.    Subjective:   Medication Access/Adherence  Patient reports affordability concerns with their medications: Yes  Patient reports access/transportation concerns to their pharmacy: No  Patient reports adherence concerns with their medications:  Yes  - due to cost of medications  Patient reports the cost of Mounjaro has increased since she reached the coverage gap. Currently will cost [phone] for 3 months supply when previously was around $120. She also reports cost of Linzess and Livalo have increased.   Reviewed her current coverage. 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   A1c and weight have improved since she started [facility]. Weight was initially 235lbs when she started [facility] and last weight was 215 lbs.   Patient reports due to cost she has stopped Livalo and is only taking over-the-counter plant sterols to lower cholesterol.    Objective:  Lab Results  Component Value Date   HGBA1C 5.9 04/18/2023    Lab Results  Component Value Date   CREATININE 0.81 04/18/2023   BUN 15 04/18/2023   NA 141 04/18/2023   K 3.7 04/18/2023   CL 100 04/18/2023   CO2 31 04/18/2023    Lab Results  Component Value Date   CHOL 219 (H) 08/27/2022   HDL 54.60 08/27/2022   LDLCALC 130 (H) 08/27/2022   LDLDIRECT 174.0 05/25/2019   TRIG 173.0 (H) 08/27/2022   CHOLHDL 4 08/27/2022    Medications Reviewed Today     Reviewed by Henrene Pastor, RPH-CPP (Pharmacist) on 04/28/23 at 1453  Med List Status: <None>   Medication Order Taking? Sig Documenting  Provider Last Dose Status Informant  Ascorbic Acid (VITAMIN C) 100 MG tablet [id number] No Take 100 mg by mouth daily. [provider] Taking Active Self  aspirin EC 81 MG tablet [id number] No Take 1 tablet (81 mg total) by mouth 2 (two) times daily after a meal. Day after surgery Dorothy Spark, PA-C Taking Active   betamethasone valerate ointment (VALISONE) 0.1 % [id number] No Apply 1 Application topically 2 (two) times daily.  Patient taking differently: Apply 1 Application topically 2 (two) times daily as needed (irritation).   Sandford Craze, NP Taking Active Self  Calcium Carbonate (CALTRATE 600 PO) [id number] No Take 600 mg by mouth daily. [provider] Taking Active Self  cholecalciferol (VITAMIN D3) 25 MCG (1000 UNIT) tablet [id number] No Take 1,000 Units by mouth daily. [provider] Taking Active Self  docusate sodium (COLACE) 100 MG capsule [id number] No Take 1 capsule (100 mg total) by mouth 2 (two) times daily. Andrez Grime M, PA-C Taking Active   fexofenadine (ALLEGRA) 180 MG tablet [id number] No Take 180 mg by mouth daily as needed for allergies. [provider] Taking Active Self  linaclotide Karlene Einstein) 290 MCG CAPS capsule [id number] No TAKE 1 CAPSULE(290 MCG) BY MOUTH DAILY BEFORE BREAKFAST Sandford Craze, NP Taking Active   lisinopril (ZESTRIL) 10 MG tablet [id number]  Take 1 tablet (10 mg total) by mouth daily. Sandford Craze, NP  Active   methocarbamol (ROBAXIN) 500 MG tablet [id number] No Take  1 tablet (500 mg total) by mouth every 8 (eight) hours as needed for muscle spasms. Dorothy Spark, PA-C Taking Active   Multiple Vitamin (MULTIVITAMIN WITH MINERALS) TABS tablet [id number] No Take 1 tablet by mouth daily. [provider] Taking Active Self  omeprazole (PRILOSEC) 40 MG capsule [id number] No TAKE 1 CAPSULE(40 MG) BY MOUTH DAILY AS NEEDED Sandford Craze, NP Taking Active Self  oxyCODONE (OXY IR/ROXICODONE) 5 MG  immediate release tablet [id number] No Take 1 tablet (5 mg total) by mouth every 4 (four) hours as needed for severe pain (pain score 7-10). Dorothy Spark, PA-C Taking Active   Plant Sterol Stanol-Pantethine (CHOLEST OFF COMPLETE PO) [id number] No Take 1 capsule by mouth daily. [provider] Taking Active Self  polyethylene glycol (MIRALAX / GLYCOLAX) 17 g packet [id number] No Take 17 g by mouth daily. Andrez Grime M, PA-C Taking Active   Probiotic Product (PROBIOTIC DAILY PO) [id number] No Take 1 capsule by mouth daily. [provider] Taking Active Self  senna (SENOKOT) 8.6 MG tablet [id number] No Take 5 tablets by mouth at bedtime. [provider] Taking Active Self  timolol (TIMOPTIC) 0.5 % ophthalmic solution [id number] No Place 1 drop into both eyes 2 (two) times daily. [provider] Taking Active Self  tirzepatide [facility] 7.5 MG/0.5ML Pen [id number]  Inject 7.5 mg into the skin once a week. Sandford Craze, NP  Active               Assessment/Plan:  Medication Management: - coordinated with [facility] for patient to utilize benefits of both her insurance plans.  - With her [employer] / [employer] Greggory Keen will be $75 for 3 month supply and Linzess will be $35 - this cost is affordable to patient.  - [employer] / [employer] did not cover Livalo - per [facility] cost would be $167 for 90 day supply. Provided patient with a coupon that could lower cost to $25.   Diabetes / weight management: - restart Mounjaro 7.5mg  weekly.    Hyperlipidemia/ASCVD Risk Reduction: - Discussed benefits of statin therapy. Recommend patient restart Livalo 4mg  daily. Provided discount card to help with cost.    Follow Up Plan: patient to call if she has any future issues with medication costs.   Henrene Pastor, PharmD Clinical Pharmacist [facility]

## 2023-05-03 DIAGNOSIS — M6281 Muscle weakness (generalized): Secondary | ICD-10-CM | POA: Diagnosis not present

## 2023-05-03 DIAGNOSIS — Z96651 Presence of right artificial knee joint: Secondary | ICD-10-CM | POA: Diagnosis not present

## 2023-05-03 DIAGNOSIS — Z471 Aftercare following joint replacement surgery: Secondary | ICD-10-CM | POA: Diagnosis not present

## 2023-05-05 DIAGNOSIS — Z7982 Long term (current) use of aspirin: Secondary | ICD-10-CM | POA: Diagnosis not present

## 2023-05-05 DIAGNOSIS — R809 Proteinuria, unspecified: Secondary | ICD-10-CM | POA: Diagnosis not present

## 2023-05-05 DIAGNOSIS — Z9181 History of falling: Secondary | ICD-10-CM | POA: Diagnosis not present

## 2023-05-05 DIAGNOSIS — K760 Fatty (change of) liver, not elsewhere classified: Secondary | ICD-10-CM | POA: Diagnosis not present

## 2023-05-05 DIAGNOSIS — Z471 Aftercare following joint replacement surgery: Secondary | ICD-10-CM | POA: Diagnosis not present

## 2023-05-05 DIAGNOSIS — K589 Irritable bowel syndrome without diarrhea: Secondary | ICD-10-CM | POA: Diagnosis not present

## 2023-05-05 DIAGNOSIS — Z7985 Long-term (current) use of injectable non-insulin antidiabetic drugs: Secondary | ICD-10-CM | POA: Diagnosis not present

## 2023-05-05 DIAGNOSIS — K219 Gastro-esophageal reflux disease without esophagitis: Secondary | ICD-10-CM | POA: Diagnosis not present

## 2023-05-05 DIAGNOSIS — Z87891 Personal history of nicotine dependence: Secondary | ICD-10-CM | POA: Diagnosis not present

## 2023-05-05 DIAGNOSIS — E1129 Type 2 diabetes mellitus with other diabetic kidney complication: Secondary | ICD-10-CM | POA: Diagnosis not present

## 2023-05-05 DIAGNOSIS — E559 Vitamin D deficiency, unspecified: Secondary | ICD-10-CM | POA: Diagnosis not present

## 2023-05-05 DIAGNOSIS — I10 Essential (primary) hypertension: Secondary | ICD-10-CM | POA: Diagnosis not present

## 2023-05-05 DIAGNOSIS — Z96651 Presence of right artificial knee joint: Secondary | ICD-10-CM | POA: Diagnosis not present

## 2023-05-05 DIAGNOSIS — R011 Cardiac murmur, unspecified: Secondary | ICD-10-CM | POA: Diagnosis not present

## 2023-05-05 DIAGNOSIS — M797 Fibromyalgia: Secondary | ICD-10-CM | POA: Diagnosis not present

## 2023-05-05 DIAGNOSIS — K7581 Nonalcoholic steatohepatitis (NASH): Secondary | ICD-10-CM | POA: Diagnosis not present

## 2023-05-05 DIAGNOSIS — M51369 Other intervertebral disc degeneration, lumbar region without mention of lumbar back pain or lower extremity pain: Secondary | ICD-10-CM | POA: Diagnosis not present

## 2023-05-07 ENCOUNTER — Ambulatory Visit (INDEPENDENT_AMBULATORY_CARE_PROVIDER_SITE_OTHER): Payer: Medicare Other

## 2023-05-07 ENCOUNTER — Other Ambulatory Visit (INDEPENDENT_AMBULATORY_CARE_PROVIDER_SITE_OTHER): Payer: Medicare Other

## 2023-05-07 DIAGNOSIS — R011 Cardiac murmur, unspecified: Secondary | ICD-10-CM | POA: Diagnosis not present

## 2023-05-07 DIAGNOSIS — Z96651 Presence of right artificial knee joint: Secondary | ICD-10-CM | POA: Diagnosis not present

## 2023-05-07 DIAGNOSIS — Z9181 History of falling: Secondary | ICD-10-CM | POA: Diagnosis not present

## 2023-05-07 DIAGNOSIS — Z471 Aftercare following joint replacement surgery: Secondary | ICD-10-CM | POA: Diagnosis not present

## 2023-05-07 DIAGNOSIS — I10 Essential (primary) hypertension: Secondary | ICD-10-CM | POA: Diagnosis not present

## 2023-05-07 DIAGNOSIS — Z7982 Long term (current) use of aspirin: Secondary | ICD-10-CM | POA: Diagnosis not present

## 2023-05-07 DIAGNOSIS — M51369 Other intervertebral disc degeneration, lumbar region without mention of lumbar back pain or lower extremity pain: Secondary | ICD-10-CM | POA: Diagnosis not present

## 2023-05-07 DIAGNOSIS — M797 Fibromyalgia: Secondary | ICD-10-CM | POA: Diagnosis not present

## 2023-05-07 DIAGNOSIS — E0590 Thyrotoxicosis, unspecified without thyrotoxic crisis or storm: Secondary | ICD-10-CM

## 2023-05-07 DIAGNOSIS — Z7985 Long-term (current) use of injectable non-insulin antidiabetic drugs: Secondary | ICD-10-CM | POA: Diagnosis not present

## 2023-05-07 DIAGNOSIS — K589 Irritable bowel syndrome without diarrhea: Secondary | ICD-10-CM | POA: Diagnosis not present

## 2023-05-07 DIAGNOSIS — R809 Proteinuria, unspecified: Secondary | ICD-10-CM | POA: Diagnosis not present

## 2023-05-07 DIAGNOSIS — K219 Gastro-esophageal reflux disease without esophagitis: Secondary | ICD-10-CM | POA: Diagnosis not present

## 2023-05-07 DIAGNOSIS — E559 Vitamin D deficiency, unspecified: Secondary | ICD-10-CM | POA: Diagnosis not present

## 2023-05-07 DIAGNOSIS — K760 Fatty (change of) liver, not elsewhere classified: Secondary | ICD-10-CM | POA: Diagnosis not present

## 2023-05-07 DIAGNOSIS — Z87891 Personal history of nicotine dependence: Secondary | ICD-10-CM | POA: Diagnosis not present

## 2023-05-07 DIAGNOSIS — K7581 Nonalcoholic steatohepatitis (NASH): Secondary | ICD-10-CM | POA: Diagnosis not present

## 2023-05-07 DIAGNOSIS — E1129 Type 2 diabetes mellitus with other diabetic kidney complication: Secondary | ICD-10-CM | POA: Diagnosis not present

## 2023-05-07 LAB — BASIC METABOLIC PANEL
BUN: 9 mg/dL (ref 6–23)
CO2: 33 meq/L — ABNORMAL HIGH (ref 19–32)
Calcium: 10.2 mg/dL (ref 8.4–10.5)
Chloride: 98 meq/L (ref 96–112)
Creatinine, Ser: 0.67 mg/dL (ref 0.40–1.20)
GFR: 89.8 mL/min (ref >60.00)
Glucose, Bld: 84 mg/dL (ref 70–99)
Potassium: 3.7 meq/L (ref 3.5–5.1)
Sodium: 137 meq/L (ref 135–145)

## 2023-05-07 NOTE — Progress Notes (Signed)
Pt here for Blood pressure check per Melissa  Pt currently takes: Losartan 25 mg daily   Pt reports she has not taken blood pressure medication today. Takes in the evening.  BP today @ = 120/82 HR = 78  Pt advised per Melissa no changes today; follow up in 3 months. Pt had BMET drawn today. OV scheduled for March.

## 2023-05-13 ENCOUNTER — Telehealth: Payer: Self-pay

## 2023-05-13 DIAGNOSIS — M25661 Stiffness of right knee, not elsewhere classified: Secondary | ICD-10-CM | POA: Diagnosis not present

## 2023-05-13 DIAGNOSIS — M25561 Pain in right knee: Secondary | ICD-10-CM | POA: Diagnosis not present

## 2023-05-13 NOTE — Telephone Encounter (Signed)
PA initiated via Covermymeds; KEY: [id number]. Awaiting determination.

## 2023-05-14 NOTE — Telephone Encounter (Signed)
PA approved. MOUNJARO INJ 7.5/0.5, use as directed, is approved through 06/02/2024 under your Medicare Part D benefit.  MOUNJARO INJ 7.5/0.5 is denied for an exception in the cost-sharing tier. Your drug is a brand name drug. In order for brand name drugs to qualify for an exception in the cost-sharing tier, the lower costsharing tier must contain other brand name drug(s) to treat your condition(s). There are no brand name drugs in the lower cost-sharing tier(s) to treat your condition(s). Therefore, your drug is excluded from an exception to the cost-sharing tier. Reviewed by: R.Ph.

## 2023-05-21 DIAGNOSIS — M25561 Pain in right knee: Secondary | ICD-10-CM | POA: Diagnosis not present

## 2023-05-27 DIAGNOSIS — M25661 Stiffness of right knee, not elsewhere classified: Secondary | ICD-10-CM | POA: Diagnosis not present

## 2023-05-27 DIAGNOSIS — M25561 Pain in right knee: Secondary | ICD-10-CM | POA: Diagnosis not present

## 2023-05-27 DIAGNOSIS — Z96651 Presence of right artificial knee joint: Secondary | ICD-10-CM | POA: Diagnosis not present

## 2023-05-29 DIAGNOSIS — M25661 Stiffness of right knee, not elsewhere classified: Secondary | ICD-10-CM | POA: Diagnosis not present

## 2023-05-29 DIAGNOSIS — M25561 Pain in right knee: Secondary | ICD-10-CM | POA: Diagnosis not present

## 2023-06-03 ENCOUNTER — Other Ambulatory Visit (INDEPENDENT_AMBULATORY_CARE_PROVIDER_SITE_OTHER): Payer: Medicare Other

## 2023-06-03 DIAGNOSIS — M25561 Pain in right knee: Secondary | ICD-10-CM | POA: Diagnosis not present

## 2023-06-03 DIAGNOSIS — M25661 Stiffness of right knee, not elsewhere classified: Secondary | ICD-10-CM | POA: Diagnosis not present

## 2023-06-03 DIAGNOSIS — E0590 Thyrotoxicosis, unspecified without thyrotoxic crisis or storm: Secondary | ICD-10-CM | POA: Diagnosis not present

## 2023-06-03 DIAGNOSIS — M6281 Muscle weakness (generalized): Secondary | ICD-10-CM | POA: Diagnosis not present

## 2023-06-03 DIAGNOSIS — Z471 Aftercare following joint replacement surgery: Secondary | ICD-10-CM | POA: Diagnosis not present

## 2023-06-03 DIAGNOSIS — Z96651 Presence of right artificial knee joint: Secondary | ICD-10-CM | POA: Diagnosis not present

## 2023-06-03 LAB — T4, FREE: Free T4: 0.96 ng/dL (ref 0.60–1.60)

## 2023-06-03 LAB — TSH: TSH: 0.56 u[IU]/mL (ref 0.35–5.50)

## 2023-06-03 LAB — T3, FREE: T3, Free: 3.5 pg/mL (ref 2.3–4.2)

## 2023-06-05 DIAGNOSIS — M25561 Pain in right knee: Secondary | ICD-10-CM | POA: Diagnosis not present

## 2023-06-05 DIAGNOSIS — M25661 Stiffness of right knee, not elsewhere classified: Secondary | ICD-10-CM | POA: Diagnosis not present

## 2023-06-10 DIAGNOSIS — M25561 Pain in right knee: Secondary | ICD-10-CM | POA: Diagnosis not present

## 2023-06-12 DIAGNOSIS — M25561 Pain in right knee: Secondary | ICD-10-CM | POA: Diagnosis not present

## 2023-06-17 DIAGNOSIS — M25561 Pain in right knee: Secondary | ICD-10-CM | POA: Diagnosis not present

## 2023-06-17 DIAGNOSIS — M25661 Stiffness of right knee, not elsewhere classified: Secondary | ICD-10-CM | POA: Diagnosis not present

## 2023-06-18 DIAGNOSIS — H02834 Dermatochalasis of left upper eyelid: Secondary | ICD-10-CM | POA: Diagnosis not present

## 2023-06-18 DIAGNOSIS — H52203 Unspecified astigmatism, bilateral: Secondary | ICD-10-CM | POA: Diagnosis not present

## 2023-06-18 DIAGNOSIS — H5202 Hypermetropia, left eye: Secondary | ICD-10-CM | POA: Diagnosis not present

## 2023-06-18 DIAGNOSIS — H04123 Dry eye syndrome of bilateral lacrimal glands: Secondary | ICD-10-CM | POA: Diagnosis not present

## 2023-06-18 DIAGNOSIS — E119 Type 2 diabetes mellitus without complications: Secondary | ICD-10-CM | POA: Diagnosis not present

## 2023-06-18 DIAGNOSIS — H02831 Dermatochalasis of right upper eyelid: Secondary | ICD-10-CM | POA: Diagnosis not present

## 2023-06-18 DIAGNOSIS — Z7984 Long term (current) use of oral hypoglycemic drugs: Secondary | ICD-10-CM | POA: Diagnosis not present

## 2023-06-18 DIAGNOSIS — H401131 Primary open-angle glaucoma, bilateral, mild stage: Secondary | ICD-10-CM | POA: Diagnosis not present

## 2023-06-18 DIAGNOSIS — H524 Presbyopia: Secondary | ICD-10-CM | POA: Diagnosis not present

## 2023-06-18 DIAGNOSIS — H43812 Vitreous degeneration, left eye: Secondary | ICD-10-CM | POA: Diagnosis not present

## 2023-06-18 LAB — HM DIABETES EYE EXAM

## 2023-06-24 DIAGNOSIS — M25661 Stiffness of right knee, not elsewhere classified: Secondary | ICD-10-CM | POA: Diagnosis not present

## 2023-06-24 DIAGNOSIS — M25561 Pain in right knee: Secondary | ICD-10-CM | POA: Diagnosis not present

## 2023-06-26 DIAGNOSIS — M25561 Pain in right knee: Secondary | ICD-10-CM | POA: Diagnosis not present

## 2023-06-26 DIAGNOSIS — M25661 Stiffness of right knee, not elsewhere classified: Secondary | ICD-10-CM | POA: Diagnosis not present

## 2023-07-03 DIAGNOSIS — M25661 Stiffness of right knee, not elsewhere classified: Secondary | ICD-10-CM | POA: Diagnosis not present

## 2023-07-03 DIAGNOSIS — M25561 Pain in right knee: Secondary | ICD-10-CM | POA: Diagnosis not present

## 2023-07-04 DIAGNOSIS — Z96651 Presence of right artificial knee joint: Secondary | ICD-10-CM | POA: Diagnosis not present

## 2023-07-04 DIAGNOSIS — Z471 Aftercare following joint replacement surgery: Secondary | ICD-10-CM | POA: Diagnosis not present

## 2023-07-04 DIAGNOSIS — M6281 Muscle weakness (generalized): Secondary | ICD-10-CM | POA: Diagnosis not present

## 2023-07-20 ENCOUNTER — Other Ambulatory Visit: Payer: Self-pay | Admitting: Family

## 2023-07-29 ENCOUNTER — Telehealth: Payer: Self-pay | Admitting: Family

## 2023-07-29 NOTE — Telephone Encounter (Signed)
 Copied from CRM [id number]. Topic: Medicare AWV >> Jul 29, 2023  3:19 PM Payton Doughty wrote: Reason for CRM: Called LVM 07/29/2023 to schedule AWV. Please schedule Virtual or Telehealth visits ONLY.   Verlee Rossetti; Care Guide Ambulatory Clinical Support  l [facility] Direct Dial: [phone]

## 2023-07-31 ENCOUNTER — Other Ambulatory Visit: Payer: Self-pay | Admitting: Family

## 2023-08-01 DIAGNOSIS — M6281 Muscle weakness (generalized): Secondary | ICD-10-CM | POA: Diagnosis not present

## 2023-08-01 DIAGNOSIS — Z96651 Presence of right artificial knee joint: Secondary | ICD-10-CM | POA: Diagnosis not present

## 2023-08-01 DIAGNOSIS — Z471 Aftercare following joint replacement surgery: Secondary | ICD-10-CM | POA: Diagnosis not present

## 2023-08-05 ENCOUNTER — Telehealth: Payer: Self-pay | Admitting: Family

## 2023-08-05 ENCOUNTER — Other Ambulatory Visit (HOSPITAL_BASED_OUTPATIENT_CLINIC_OR_DEPARTMENT_OTHER): Payer: Self-pay

## 2023-08-05 ENCOUNTER — Ambulatory Visit (INDEPENDENT_AMBULATORY_CARE_PROVIDER_SITE_OTHER): Payer: Medicare Other | Admitting: Family

## 2023-08-05 VITALS — BP 101/50 | HR 73 | Temp 98.8°F | Resp 16 | Ht 68.0 in | Wt 213.0 lb

## 2023-08-05 DIAGNOSIS — R809 Proteinuria, unspecified: Secondary | ICD-10-CM

## 2023-08-05 DIAGNOSIS — N393 Stress incontinence (female) (male): Secondary | ICD-10-CM

## 2023-08-05 DIAGNOSIS — K219 Gastro-esophageal reflux disease without esophagitis: Secondary | ICD-10-CM

## 2023-08-05 DIAGNOSIS — F32A Depression, unspecified: Secondary | ICD-10-CM

## 2023-08-05 DIAGNOSIS — K581 Irritable bowel syndrome with constipation: Secondary | ICD-10-CM

## 2023-08-05 DIAGNOSIS — I10 Essential (primary) hypertension: Secondary | ICD-10-CM | POA: Diagnosis not present

## 2023-08-05 DIAGNOSIS — M797 Fibromyalgia: Secondary | ICD-10-CM | POA: Diagnosis not present

## 2023-08-05 DIAGNOSIS — E1129 Type 2 diabetes mellitus with other diabetic kidney complication: Secondary | ICD-10-CM | POA: Diagnosis not present

## 2023-08-05 DIAGNOSIS — E559 Vitamin D deficiency, unspecified: Secondary | ICD-10-CM | POA: Diagnosis not present

## 2023-08-05 DIAGNOSIS — E785 Hyperlipidemia, unspecified: Secondary | ICD-10-CM

## 2023-08-05 DIAGNOSIS — Z7985 Long-term (current) use of injectable non-insulin antidiabetic drugs: Secondary | ICD-10-CM | POA: Diagnosis not present

## 2023-08-05 DIAGNOSIS — F419 Anxiety disorder, unspecified: Secondary | ICD-10-CM

## 2023-08-05 LAB — COMPREHENSIVE METABOLIC PANEL
ALT: 14 U/L (ref 0–35)
AST: 9 U/L (ref 0–37)
Albumin: 4.2 g/dL (ref 3.5–5.2)
Alkaline Phosphatase: 67 U/L (ref 39–117)
BUN: 17 mg/dL (ref 6–23)
CO2: 32 meq/L (ref 19–32)
Calcium: 10.2 mg/dL (ref 8.4–10.5)
Chloride: 103 meq/L (ref 96–112)
Creatinine, Ser: 0.72 mg/dL (ref 0.40–1.20)
GFR: 85.75 mL/min (ref >60.00)
Glucose, Bld: 96 mg/dL (ref 70–99)
Potassium: 4.1 meq/L (ref 3.5–5.1)
Sodium: 140 meq/L (ref 135–145)
Total Bilirubin: 0.5 mg/dL (ref 0.2–1.2)
Total Protein: 7.2 g/dL (ref 6.0–8.3)

## 2023-08-05 LAB — LIPID PANEL
Cholesterol: 277 mg/dL — ABNORMAL HIGH (ref 0–200)
HDL: 50.3 mg/dL (ref >39.00)
LDL Cholesterol: 178 mg/dL — ABNORMAL HIGH (ref 0–99)
NonHDL: 226.9
Total CHOL/HDL Ratio: 6
Triglycerides: 244 mg/dL — ABNORMAL HIGH (ref 0.0–149.0)
VLDL: 48.8 mg/dL — ABNORMAL HIGH (ref 0.0–40.0)

## 2023-08-05 LAB — VITAMIN D 25 HYDROXY (VIT D DEFICIENCY, FRACTURES): VITD: 53.72 ng/mL (ref 30.00–100.00)

## 2023-08-05 MED ORDER — COVID-19 MRNA VAC-TRIS(PFIZER) 30 MCG/0.3ML IM SUSY
0.3000 mL | PREFILLED_SYRINGE | Freq: Once | INTRAMUSCULAR | 0 refills | Status: AC
Start: 2023-08-05 — End: 2023-08-06
  Filled 2023-08-05: qty 0.3, 1d supply, fill #0

## 2023-08-05 MED ORDER — HYDROCHLOROTHIAZIDE 25 MG PO TABS: 25.0000 mg | ORAL_TABLET | Freq: Every day | ORAL | Status: DC

## 2023-08-05 MED ORDER — ATORVASTATIN CALCIUM 20 MG PO TABS
20.0000 mg | ORAL_TABLET | Freq: Every day | ORAL | 0 refills | Status: DC
Start: 2023-08-05 — End: 2023-11-11

## 2023-08-05 NOTE — Assessment & Plan Note (Signed)
 Lab Results  Component Value Date   HGBA1C 5.9 04/18/2023   HGBA1C 6.3 12/25/2022   HGBA1C 7.0 (H) 08/27/2022   Lab Results  Component Value Date   MICROALBUR 4.1 (H) 04/18/2023   LDLCALC 130 (H) 08/27/2022   CREATININE 0.67 05/07/2023    Wt Readings from Last 3 Encounters:  08/05/23 213 lb (96.6 kg)  04/18/23 215 lb (97.5 kg)  03/21/23 221 lb (100.2 kg)  Update A1C, has been well controlled on mounjaro 7.5mg . continue same.

## 2023-08-05 NOTE — Assessment & Plan Note (Signed)
 Resolved

## 2023-08-05 NOTE — Progress Notes (Signed)
 Subjective:     Patient ID: [name], female    DOB: 1956-02-25, 68 y.o.   MRN: [id number]  Chief Complaint  Patient presents with   Hypertension    Here for follow up    HPI  Discussed the use of AI scribe software for clinical note transcription with the patient, who gave verbal consent to proceed.  History of Present Illness  Patient presents today for follow up.  She reports that livalo is too expensive and request an alternative.  Also, linzess is too expensive and she did not find it helpful.  She is managing her constipation with miralax, probiotic and senna.     She continues mounjaro for DM and weight management.  She is losing weight.   Wt Readings from Last 3 Encounters:  08/05/23 213 lb (96.6 kg)  04/18/23 215 lb (97.5 kg)  03/21/23 221 lb (100.2 kg)    Last visit we advised her to d/c hydrochlorothiazide and lisinopril and instead begin losartan. She did stop lisinopril and start losartan but she continues the hydrochlorothiazide.   Reports resolution of stress incontinence.      Health Maintenance Due  Topic Date Due   DTaP/Tdap/Td (2 - Td or Tdap) 11/07/2021   OPHTHALMOLOGY EXAM  05/07/2023   Medicare Annual Wellness (AWV)  09/11/2023    Past Medical History:  Diagnosis Date   Allergy    SEASONAL   Arthritis    Back pain    Benign fundic gland polyps of stomach    Cataract    EARL STAGES   Constipation    COVID-19 09/04/2022   Esophagitis    LA Class B   Family history of adverse reaction to anesthesia    Maternal aunt died on the table at age 75 while having a hysterectomy. only had hx of DM.   Fatigue    Fatty liver    Fibromyalgia    Gallbladder problem    GERD (gastroesophageal reflux disease)    Heart murmur    History of arthroscopy of right knee    Hyperlipidemia    Hypertension    IBS (irritable bowel syndrome)    Joint pain    Neuromuscular disorder (HCC)    Pneumonia    Pre-diabetes     Past Surgical History:   Procedure Laterality Date   CHOLECYSTECTOMY  2009   low GB EF, no gallstones   COLONOSCOPY     EYE SURGERY Bilateral    cataract surgery w/ IOL   knee arthroscopy right  04/22/2022   LAPAROSCOPY     of adhesions/ of ovaries   TOTAL KNEE ARTHROPLASTY Right 03/21/2023   Procedure: TOTAL KNEE ARTHROPLASTY;  Surgeon: Jene Every, MD;  Location: [facility];  Service: Orthopedics;  Laterality: Right;  150 min   UPPER GASTROINTESTINAL ENDOSCOPY     VAGINAL HYSTERECTOMY      Family History  Problem Relation Age of Onset   Heart disease Mother    Hypertension Mother    Hyperlipidemia Mother    Obesity Mother    Colon cancer Father    Hypertension Father    Hyperlipidemia Father    Obesity Father    Colon polyps Sister    Diabetes Sister    Diabetes Brother        Half   Heart disease Brother    Hypertension Brother    Sickle cell anemia Maternal Grandmother    Breast cancer Paternal Grandmother    Sickle cell trait Son  Esophageal cancer Neg Hx    Stomach cancer Neg Hx    Rectal cancer Neg Hx    Crohn's disease Neg Hx    Ulcerative colitis Neg Hx     Social History   Socioeconomic History   Marital status: Single    Spouse name: Not on file   Number of children: Not on file   Years of education: Not on file   Highest education level: Bachelor's degree (e.g., BA, AB, BS)  Occupational History   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  Tobacco Use   Smoking status: Former    Current packs/day: 0.00    Average packs/day: 0.1 packs/day for 10.0 years (1.0 ttl pk-yrs)    Types: Cigarettes    Start date: 07/15/1971    Quit date: 07/14/1981    Years since quitting: 42.0    Passive exposure: Never   Smokeless tobacco: Never  Vaping Use   Vaping status: Never Used  Substance and Sexual Activity   Alcohol use: No   Drug use: No   Sexual activity: Not Currently  Other Topics Concern   Not on file  Social History Narrative   Widowed- husband passed last year    2 grown sons, grandchildren   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, Ship broker- retired   No pets   Enjoys writing, reading, being a grandmother, walking   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   Completed college   Social Drivers of Corporate investment banker Strain: Low Risk  (04/17/2023)   Overall Financial Resource Strain (CARDIA)    Difficulty of Paying Living Expenses: Not hard at all  Food Insecurity: No Food Insecurity (04/17/2023)   Hunger Vital Sign    Worried About Running Out of Food in the Last Year: Never true    Ran Out of Food in the Last Year: Never true  Transportation Needs: No Transportation Needs (04/17/2023)   PRAPARE - Administrator, Civil Service (Medical): No    Lack of Transportation (Non-Medical): No  Physical Activity: Insufficiently Active (04/17/2023)   Exercise Vital Sign    Days of Exercise per Week: 2 days    Minutes of Exercise per Session: 10 min  Stress: No Stress Concern Present (04/17/2023)   [employer] of Occupational Health - Occupational Stress Questionnaire    Feeling of Stress : Not at all  Social Connections: Moderately Integrated (04/17/2023)   Social Connection and Isolation Panel [NHANES]    Frequency of Communication with Friends and Family: More than three times a week    Frequency of Social Gatherings with Friends and Family: Once a week    Attends Religious Services: More than 4 times per year    Active Member of [employer] or Organizations: Yes    Attends Banker Meetings: More than 4 times per year    Marital Status: Widowed  Intimate Partner Violence: Not At Risk (03/21/2023)   Humiliation, Afraid, Rape, and Kick questionnaire    Fear of Current or Ex-Partner: No    Emotionally Abused: No    Physically Abused: No    Sexually Abused: No    Outpatient Medications Prior to Visit  Medication Sig Dispense Refill   Ascorbic Acid (VITAMIN C) 100 MG tablet Take 100 mg by  mouth daily.     aspirin EC 81 MG tablet Take 1 tablet (81 mg total) by mouth 2 (two) times daily after a meal. Day after surgery 60 tablet  1   betamethasone valerate ointment (VALISONE) 0.1 % Apply 1 Application topically 2 (two) times daily. (Patient taking differently: Apply 1 Application topically 2 (two) times daily as needed (irritation).) 30 g 0   Calcium Carbonate (CALTRATE 600 PO) Take 600 mg by mouth daily.     cholecalciferol (VITAMIN D3) 25 MCG (1000 UNIT) tablet Take 1,000 Units by mouth daily.     docusate sodium (COLACE) 100 MG capsule Take 1 capsule (100 mg total) by mouth 2 (two) times daily. 60 capsule 2   fexofenadine (ALLEGRA) 180 MG tablet Take 180 mg by mouth daily as needed for allergies.     losartan (COZAAR) 25 MG tablet TAKE 1 TABLET(25 MG) BY MOUTH DAILY 90 tablet 0   methocarbamol (ROBAXIN) 500 MG tablet Take 1 tablet (500 mg total) by mouth every 8 (eight) hours as needed for muscle spasms. 30 tablet 1   Multiple Vitamin (MULTIVITAMIN WITH MINERALS) TABS tablet Take 1 tablet by mouth daily.     omeprazole (PRILOSEC) 40 MG capsule TAKE 1 CAPSULE(40 MG) BY MOUTH DAILY AS NEEDED 90 capsule 1   oxyCODONE (OXY IR/ROXICODONE) 5 MG immediate release tablet Take 1 tablet (5 mg total) by mouth every 4 (four) hours as needed for severe pain (pain score 7-10). 40 tablet 0   Plant Sterol Stanol-Pantethine (CHOLEST OFF COMPLETE PO) Take 1 capsule by mouth daily.     polyethylene glycol (MIRALAX / GLYCOLAX) 17 g packet Take 17 g by mouth daily. 14 each 0   Probiotic Product (PROBIOTIC DAILY PO) Take 1 capsule by mouth daily.     senna (SENOKOT) 8.6 MG tablet Take 5 tablets by mouth at bedtime.     timolol (TIMOPTIC) 0.5 % ophthalmic solution Place 1 drop into both eyes 2 (two) times daily.     tirzepatide (MOUNJARO) 7.5 MG/0.5ML Pen Inject 7.5 mg into the skin once a week. 6 mL 1   linaclotide (LINZESS) 290 MCG CAPS capsule TAKE 1 CAPSULE(290 MCG) BY MOUTH DAILY BEFORE BREAKFAST 90  capsule 1   No facility-administered medications prior to visit.    Allergies  Allergen Reactions   Metformin And Related     abd pain   Rosuvastatin Other (See Comments)    Muscle aches   Lisinopril     headache    ROS See HPI    Objective:    Physical Exam Constitutional:      General: She is not in acute distress.    Appearance: Normal appearance. She is well-developed.  HENT:     Head: Normocephalic and atraumatic.     Right Ear: External ear normal.     Left Ear: External ear normal.  Eyes:     General: No scleral icterus. Neck:     Thyroid: No thyromegaly.  Cardiovascular:     Rate and Rhythm: Normal rate and regular rhythm.     Heart sounds: Normal heart sounds. No murmur heard. Pulmonary:     Effort: Pulmonary effort is normal. No respiratory distress.     Breath sounds: Normal breath sounds. No wheezing.  Musculoskeletal:     Cervical back: Neck supple.  Skin:    General: Skin is warm and dry.  Neurological:     Mental Status: She is alert and oriented to person, place, and time.  Psychiatric:        Mood and Affect: Mood normal.        Behavior: Behavior normal.        Thought Content: Thought  content normal.        Judgment: Judgment normal.      BP (!) 101/50 (BP Location: Right Arm, Patient Position: Sitting)   Pulse 73   Temp 98.8 F (37.1 C) (Oral)   Resp 16   Ht 5\' 8"  (1.727 m)   Wt 213 lb (96.6 kg)   SpO2 100%   BMI 32.39 kg/m  Wt Readings from Last 3 Encounters:  08/05/23 213 lb (96.6 kg)  04/18/23 215 lb (97.5 kg)  03/21/23 221 lb (100.2 kg)       Assessment & Plan:   Problem List Items Addressed This Visit       Unprioritized   Vitamin D deficiency disease   On supplement 1000 international units daily- update level.       Stress incontinence   Resolved.       IBS (irritable bowel syndrome)   Mainly constipation. She takes "prunalax", senokot, miralax. No improvement with linzess, but she is managing. Linzess is  too expensive, d/c.       HYPERTENSION, BENIGN ESSENTIAL   BP Readings from Last 3 Encounters:  08/05/23 (!) 101/50  04/18/23 (!) 111/51  03/25/23 (!) 142/70   Stable on losartan and linzess.       Relevant Medications   atorvastatin (LIPITOR) 20 MG tablet   hydrochlorothiazide (HYDRODIURIL) 25 MG tablet   Other Relevant Orders   Comp Met (CMET)   Hyperlipidemia with target LDL less than 100   Lab Results  Component Value Date   CHOL 219 (H) 08/27/2022   HDL 54.60 08/27/2022   LDLCALC 130 (H) 08/27/2022   LDLDIRECT 174.0 05/25/2019   TRIG 173.0 (H) 08/27/2022   CHOLHDL 4 08/27/2022   Livalo too expensive, trial of atorvastatin. She had muscle pain on crestor. Will try atorvastatin- advised ok to take every other day if needed for tolerance purposes.       Relevant Medications   atorvastatin (LIPITOR) 20 MG tablet   hydrochlorothiazide (HYDRODIURIL) 25 MG tablet   Other Relevant Orders   Lipid panel   GERD   Stable on omeprazole, continue same.       Fibromyalgia muscle pain   Managing without cymbalta.  Using meloxicam and gabapentin, declines cymbalta at this time.       Controlled type 2 diabetes mellitus with microalbuminuria, without long-term current use of insulin (HCC)   Lab Results  Component Value Date   HGBA1C 5.9 04/18/2023   HGBA1C 6.3 12/25/2022   HGBA1C 7.0 (H) 08/27/2022   Lab Results  Component Value Date   MICROALBUR 4.1 (H) 04/18/2023   LDLCALC 130 (H) 08/27/2022   CREATININE 0.67 05/07/2023    Wt Readings from Last 3 Encounters:  08/05/23 213 lb (96.6 kg)  04/18/23 215 lb (97.5 kg)  03/21/23 221 lb (100.2 kg)  Update A1C, has been well controlled on mounjaro 7.5mg . continue same.         Relevant Medications   atorvastatin (LIPITOR) 20 MG tablet   Anxiety and depression   Much better now that she has retired.       Other Visit Diagnoses       Vitamin D deficiency    -  Primary   Relevant Orders   Vitamin D (25 hydroxy)        I have discontinued [name]. Walkins's Linzess. I am also having her start on atorvastatin and hydrochlorothiazide. Additionally, I am having her maintain her fexofenadine, cholecalciferol, vitamin C, multivitamin with minerals, timolol, senna,  betamethasone valerate ointment, Plant Sterol Stanol-Pantethine (CHOLEST OFF COMPLETE PO), Calcium Carbonate (CALTRATE 600 PO), Probiotic Product (PROBIOTIC DAILY PO), aspirin EC, oxyCODONE, docusate sodium, polyethylene glycol, methocarbamol, tirzepatide, omeprazole, and losartan.  Meds ordered this encounter  Medications   atorvastatin (LIPITOR) 20 MG tablet    Sig: Take 1 tablet (20 mg total) by mouth daily.    Dispense:  90 tablet    Refill:  0    Supervising Provider:   Danise Edge A [[id number]]   hydrochlorothiazide (HYDRODIURIL) 25 MG tablet    Sig: Take 1 tablet (25 mg total) by mouth daily.    Supervising Provider:   Danise Edge A [phone]

## 2023-08-05 NOTE — Assessment & Plan Note (Signed)
 Much better now that she has retired.

## 2023-08-05 NOTE — Assessment & Plan Note (Signed)
 On supplement 1000 international units daily- update level.

## 2023-08-05 NOTE — Assessment & Plan Note (Addendum)
 Mainly constipation. She takes "prunalax", senokot, miralax. No improvement with linzess, but she is managing. Linzess is too expensive, d/c.

## 2023-08-05 NOTE — Assessment & Plan Note (Addendum)
 Lab Results  Component Value Date   CHOL 219 (H) 08/27/2022   HDL 54.60 08/27/2022   LDLCALC 130 (H) 08/27/2022   LDLDIRECT 174.0 05/25/2019   TRIG 173.0 (H) 08/27/2022   CHOLHDL 4 08/27/2022   Livalo too expensive, trial of atorvastatin. She had muscle pain on crestor. Will try atorvastatin- advised ok to take every other day if needed for tolerance purposes.

## 2023-08-05 NOTE — Patient Instructions (Addendum)
 -  COLITIS: Colitis is inflammation of the colon that can cause diarrhea and abdominal pain. Your colitis flares with stress and certain foods, so please continue your current management strategies to control symptoms.  -HIP PAIN: Your hip pain may be due to bursitis, which is inflammation of the fluid-filled sacs that cushion your joints. Consider following up with your orthopedic provider, Dr. Nani Skillern, for further evaluation and management.  -GENERAL HEALTH MAINTENANCE: For your general health, consider getting the Shingrix vaccine for shingles prevention. Schedule a mammogram with [facility] and a colonoscopy for 2023. We will also schedule a 40-month follow-up visit and a lab visit in 6 weeks for your thyroid studies.  INSTRUCTIONS:  Please follow up with the lab today for repeat alkaline phosphatase and platelet count. Schedule a follow-up visit in 6 months and a lab visit in 6 weeks for thyroid studies. Additionally, consider scheduling a mammogram with [facility] and a colonoscopy for 2023. Follow up with your orthopedic provider, Dr. Nani Skillern, regarding your hip pain.

## 2023-08-05 NOTE — Assessment & Plan Note (Signed)
 Managing without cymbalta.  Using meloxicam and gabapentin, declines cymbalta at this time.

## 2023-08-05 NOTE — Assessment & Plan Note (Signed)
 Stable on omeprazole, continue same.

## 2023-08-05 NOTE — Assessment & Plan Note (Signed)
 BP Readings from Last 3 Encounters:  08/05/23 (!) 101/50  04/18/23 (!) 111/51  03/25/23 (!) 142/70   Stable on losartan and linzess.

## 2023-08-06 ENCOUNTER — Encounter: Payer: Self-pay | Admitting: Family

## 2023-08-06 ENCOUNTER — Other Ambulatory Visit: Payer: Self-pay | Admitting: Family

## 2023-08-06 NOTE — Telephone Encounter (Signed)
 Opened in error

## 2023-09-23 ENCOUNTER — Ambulatory Visit (INDEPENDENT_AMBULATORY_CARE_PROVIDER_SITE_OTHER): Admitting: Family

## 2023-09-23 DIAGNOSIS — Z96651 Presence of right artificial knee joint: Secondary | ICD-10-CM | POA: Diagnosis not present

## 2023-09-23 DIAGNOSIS — T148XXA Other injury of unspecified body region, initial encounter: Secondary | ICD-10-CM

## 2023-09-23 NOTE — Assessment & Plan Note (Signed)
 Suspect resolving hematoma right forehead.  No sign of infection today, neuro intact. Recommend that she follow up if she develops increased pain/swelling/tenderness in this area.  Reassurance provided.

## 2023-09-23 NOTE — Progress Notes (Signed)
 Subjective:     Patient ID: [name], female    DOB: [birth year], 68 y.o.   MRN: [id number]  No chief complaint on file.   HPI  Discussed the use of AI scribe software for clinical note transcription with the patient, who gave verbal consent to proceed.  History of Present Illness  She is a 68 year old female who presents with persistent head pain and swelling following a fall.  She has been experiencing persistent pain and swelling in the head following a fall down the steps in October, which resulted in a head injury. A CT scan of her head performed at the ER at that time was normal. Despite this, she continues to experience pain that sometimes radiates to her whole eye and is tender to touch. The swelling has been intermittent, worsening at times and then improving.  The pain and swelling sometimes become hot and more painful, particularly noted the week before last. She has been taking an anti-inflammatory medication for her knee, but she is unsure if it helps with the head pain. She also took an antibiotic prescribed by her knee doctor for dental work, which seemed to alleviate the symptoms slightly, raising her concern about a possible infection.  No changes in vision, although she notes her vision is 'always bad'. She is concerned about the head pain potentially interfering with upcoming dental work.     Health Maintenance Due  Topic Date Due   DTaP/Tdap/Td (2 - Td or Tdap) 11/07/2021   OPHTHALMOLOGY EXAM  05/07/2023   Medicare Annual Wellness (AWV)  09/11/2023   FOOT EXAM  08/27/2023    Past Medical History:  Diagnosis Date   Allergy    SEASONAL   Arthritis    Back pain    Benign fundic gland polyps of stomach    Cataract    EARL STAGES   Constipation    COVID-19 09/04/2022   Esophagitis    LA Class B   Family history of adverse reaction to anesthesia    Maternal aunt died on the table at age 53 while having a hysterectomy. only had hx of DM.   Fatigue     Fatty liver    Fibromyalgia    Gallbladder problem    GERD (gastroesophageal reflux disease)    Heart murmur    History of arthroscopy of right knee    Hyperlipidemia    Hypertension    IBS (irritable bowel syndrome)    Joint pain    Neuromuscular disorder (HCC)    Pneumonia    Pre-diabetes     Past Surgical History:  Procedure Laterality Date   CHOLECYSTECTOMY  2009   low GB EF, no gallstones   COLONOSCOPY     EYE SURGERY Bilateral    cataract surgery w/ IOL   knee arthroscopy right  04/22/2022   LAPAROSCOPY     of adhesions/ of ovaries   TOTAL KNEE ARTHROPLASTY Right 03/21/2023   Procedure: TOTAL KNEE ARTHROPLASTY;  Surgeon: Orvan Blanch, MD;  Location: [facility];  Service: Orthopedics;  Laterality: Right;  150 min   UPPER GASTROINTESTINAL ENDOSCOPY     VAGINAL HYSTERECTOMY      Family History  Problem Relation Age of Onset   Heart disease Mother    Hypertension Mother    Hyperlipidemia Mother    Obesity Mother    Colon cancer Father    Hypertension Father    Hyperlipidemia Father    Obesity Father    Colon polyps Sister  Diabetes Sister    Diabetes Brother        Half   Heart disease Brother    Hypertension Brother    Sickle cell anemia Maternal Grandmother    Breast cancer Paternal Grandmother    Sickle cell trait Son    Esophageal cancer Neg Hx    Stomach cancer Neg Hx    Rectal cancer Neg Hx    Crohn's disease Neg Hx    Ulcerative colitis Neg Hx     Social History   Socioeconomic History   Marital status: Single    Spouse name: Not on file   Number of children: Not on file   Years of education: Not on file   Highest education level: Bachelor's degree (e.g., BA, AB, BS)  Occupational History   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  Tobacco Use   Smoking status: Former    Current packs/day: 0.00    Average packs/day: 0.1 packs/day for 10.0 years (1.0 ttl pk-yrs)    Types: Cigarettes    Start date: 07/15/1971    Quit date:  07/14/1981    Years since quitting: 42.2    Passive exposure: Never   Smokeless tobacco: Never  Vaping Use   Vaping status: Never Used  Substance and Sexual Activity   Alcohol use: No   Drug use: No   Sexual activity: Not Currently  Other Topics Concern   Not on file  Social History Narrative   Widowed- husband passed last year   2 grown sons, grandchildren   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, Ship broker- retired   No pets   Enjoys writing, reading, being a grandmother, walking   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   Completed college   Social Drivers of Corporate investment banker Strain: Low Risk  (09/22/2023)   Overall Financial Resource Strain (CARDIA)    Difficulty of Paying Living Expenses: Not very hard  Food Insecurity: Food Insecurity Present (09/22/2023)   Hunger Vital Sign    Worried About Running Out of Food in the Last Year: Sometimes true    Ran Out of Food in the Last Year: Sometimes true  Transportation Needs: Patient Declined (09/22/2023)   PRAPARE - Transportation    Lack of Transportation (Medical): Patient declined    Lack of Transportation (Non-Medical): Patient declined  Physical Activity: Insufficiently Active (09/22/2023)   Exercise Vital Sign    Days of Exercise per Week: 3 days    Minutes of Exercise per Session: 20 min  Stress: No Stress Concern Present (04/17/2023)   [employer] of Occupational Health - Occupational Stress Questionnaire    Feeling of Stress : Not at all  Social Connections: Moderately Integrated (09/22/2023)   Social Connection and Isolation Panel [NHANES]    Frequency of Communication with Friends and Family: More than three times a week    Frequency of Social Gatherings with Friends and Family: More than three times a week    Attends Religious Services: More than 4 times per year    Active Member of [employer] or Organizations: Yes    Attends Banker Meetings: More than 4 times per  year    Marital Status: Widowed  Intimate Partner Violence: Not At Risk (03/21/2023)   Humiliation, Afraid, Rape, and Kick questionnaire    Fear of Current or Ex-Partner: No    Emotionally Abused: No    Physically Abused: No    Sexually Abused: No    Outpatient  Medications Prior to Visit  Medication Sig Dispense Refill   Ascorbic Acid  (VITAMIN C ) 100 MG tablet Take 100 mg by mouth daily.     aspirin  EC 81 MG tablet Take 1 tablet (81 mg total) by mouth 2 (two) times daily after a meal. Day after surgery 60 tablet 1   atorvastatin  (LIPITOR) 20 MG tablet Take 1 tablet (20 mg total) by mouth daily. 90 tablet 0   betamethasone  valerate ointment (VALISONE ) 0.1 % Apply 1 Application topically 2 (two) times daily. (Patient taking differently: Apply 1 Application topically 2 (two) times daily as needed (irritation).) 30 g 0   Calcium  Carbonate (CALTRATE 600 PO) Take 600 mg by mouth daily.     cholecalciferol  (VITAMIN D3) 25 MCG (1000 UNIT) tablet Take 1,000 Units by mouth daily.     docusate sodium  (COLACE) 100 MG capsule Take 1 capsule (100 mg total) by mouth 2 (two) times daily. 60 capsule 2   fexofenadine (ALLEGRA) 180 MG tablet Take 180 mg by mouth daily as needed for allergies.     hydrochlorothiazide  (HYDRODIURIL ) 25 MG tablet Take 1 tablet (25 mg total) by mouth daily.     losartan  (COZAAR ) 25 MG tablet TAKE 1 TABLET(25 MG) BY MOUTH DAILY 90 tablet 0   methocarbamol  (ROBAXIN ) 500 MG tablet Take 1 tablet (500 mg total) by mouth every 8 (eight) hours as needed for muscle spasms. 30 tablet 1   Multiple Vitamin (MULTIVITAMIN WITH MINERALS) TABS tablet Take 1 tablet by mouth daily.     omeprazole  (PRILOSEC) 40 MG capsule TAKE 1 CAPSULE(40 MG) BY MOUTH DAILY AS NEEDED 90 capsule 1   oxyCODONE  (OXY IR/ROXICODONE ) 5 MG immediate release tablet Take 1 tablet (5 mg total) by mouth every 4 (four) hours as needed for severe pain (pain score 7-10). 40 tablet 0   Plant Sterol Stanol-Pantethine (CHOLEST OFF  COMPLETE PO) Take 1 capsule by mouth daily.     polyethylene glycol (MIRALAX  / GLYCOLAX ) 17 g packet Take 17 g by mouth daily. 14 each 0   Probiotic Product (PROBIOTIC DAILY PO) Take 1 capsule by mouth daily.     senna (SENOKOT) 8.6 MG tablet Take 5 tablets by mouth at bedtime.     timolol  (TIMOPTIC ) 0.5 % ophthalmic solution Place 1 drop into both eyes 2 (two) times daily.     tirzepatide  (MOUNJARO ) 7.5 MG/0.5ML Pen Inject 7.5 mg into the skin once a week. 6 mL 1   No facility-administered medications prior to visit.    Allergies  Allergen Reactions   Metformin  And Related     abd pain   Rosuvastatin  Other (See Comments)    Muscle aches   Lisinopril      headache    ROS See HPI    Objective:    Physical Exam Constitutional:      General: She is not in acute distress.    Appearance: Normal appearance. She is well-developed.  HENT:     Head:     Comments: Slight area of mild swelling/tenderness right forehead. No erythema, no fluctuance    Right Ear: External ear normal.     Left Ear: External ear normal.  Eyes:     General: No scleral icterus.    Extraocular Movements: Extraocular movements intact.  Neck:     Thyroid : No thyromegaly.  Cardiovascular:     Rate and Rhythm: Normal rate and regular rhythm.     Heart sounds: Normal heart sounds. No murmur heard. Pulmonary:     Effort:  Pulmonary effort is normal. No respiratory distress.     Breath sounds: Normal breath sounds. No wheezing.  Musculoskeletal:        General: No swelling.     Cervical back: Neck supple.  Skin:    General: Skin is warm and dry.  Neurological:     General: No focal deficit present.     Mental Status: She is alert and oriented to person, place, and time.  Psychiatric:        Mood and Affect: Mood normal.        Behavior: Behavior normal.        Thought Content: Thought content normal.        Judgment: Judgment normal.      There were no vitals taken for this visit. Wt Readings from  Last 3 Encounters:  08/05/23 213 lb (96.6 kg)  04/18/23 215 lb (97.5 kg)  03/21/23 221 lb (100.2 kg)       Assessment & Plan:   Problem List Items Addressed This Visit       Unprioritized   Hematoma - Primary   Suspect resolving hematoma right forehead.  No sign of infection today, neuro intact. Recommend that she follow up if she develops increased pain/swelling/tenderness in this area.  Reassurance provided.        I am having [place] Crance. Barrientez maintain her fexofenadine, cholecalciferol , vitamin C , multivitamin with minerals, timolol , senna, betamethasone  valerate ointment, Plant Sterol Stanol-Pantethine (CHOLEST OFF COMPLETE PO), Calcium  Carbonate (CALTRATE 600 PO), Probiotic Product (PROBIOTIC DAILY PO), aspirin  EC, oxyCODONE , docusate sodium , polyethylene glycol, methocarbamol , tirzepatide , omeprazole , losartan , atorvastatin , and hydrochlorothiazide .  No orders of the defined types were placed in this encounter.

## 2023-09-24 DIAGNOSIS — Z96651 Presence of right artificial knee joint: Secondary | ICD-10-CM | POA: Diagnosis not present

## 2023-09-25 DIAGNOSIS — G8929 Other chronic pain: Secondary | ICD-10-CM | POA: Diagnosis not present

## 2023-09-25 DIAGNOSIS — M25561 Pain in right knee: Secondary | ICD-10-CM | POA: Diagnosis not present

## 2023-10-06 ENCOUNTER — Other Ambulatory Visit: Payer: Self-pay | Admitting: Family

## 2023-10-06 DIAGNOSIS — R809 Proteinuria, unspecified: Secondary | ICD-10-CM

## 2023-10-08 ENCOUNTER — Other Ambulatory Visit (HOSPITAL_COMMUNITY): Payer: Self-pay

## 2023-10-09 DIAGNOSIS — G8929 Other chronic pain: Secondary | ICD-10-CM | POA: Diagnosis not present

## 2023-10-09 DIAGNOSIS — Z96651 Presence of right artificial knee joint: Secondary | ICD-10-CM | POA: Diagnosis not present

## 2023-10-09 DIAGNOSIS — M25561 Pain in right knee: Secondary | ICD-10-CM | POA: Diagnosis not present

## 2023-10-12 ENCOUNTER — Other Ambulatory Visit: Payer: Self-pay | Admitting: Family

## 2023-10-12 DIAGNOSIS — I10 Essential (primary) hypertension: Secondary | ICD-10-CM

## 2023-10-14 ENCOUNTER — Encounter: Payer: Self-pay | Admitting: Family

## 2023-10-14 DIAGNOSIS — Z96651 Presence of right artificial knee joint: Secondary | ICD-10-CM | POA: Diagnosis not present

## 2023-10-14 DIAGNOSIS — M25561 Pain in right knee: Secondary | ICD-10-CM | POA: Diagnosis not present

## 2023-10-14 DIAGNOSIS — G8929 Other chronic pain: Secondary | ICD-10-CM | POA: Diagnosis not present

## 2023-10-17 DIAGNOSIS — H04123 Dry eye syndrome of bilateral lacrimal glands: Secondary | ICD-10-CM | POA: Diagnosis not present

## 2023-10-17 DIAGNOSIS — H401131 Primary open-angle glaucoma, bilateral, mild stage: Secondary | ICD-10-CM | POA: Diagnosis not present

## 2023-10-18 ENCOUNTER — Other Ambulatory Visit: Payer: Self-pay | Admitting: Family

## 2023-10-21 DIAGNOSIS — M25561 Pain in right knee: Secondary | ICD-10-CM | POA: Diagnosis not present

## 2023-10-21 DIAGNOSIS — G8929 Other chronic pain: Secondary | ICD-10-CM | POA: Diagnosis not present

## 2023-10-21 DIAGNOSIS — Z96651 Presence of right artificial knee joint: Secondary | ICD-10-CM | POA: Diagnosis not present

## 2023-10-23 DIAGNOSIS — G8929 Other chronic pain: Secondary | ICD-10-CM | POA: Diagnosis not present

## 2023-10-23 DIAGNOSIS — Z96651 Presence of right artificial knee joint: Secondary | ICD-10-CM | POA: Diagnosis not present

## 2023-10-23 DIAGNOSIS — M25561 Pain in right knee: Secondary | ICD-10-CM | POA: Diagnosis not present

## 2023-10-28 DIAGNOSIS — Z96651 Presence of right artificial knee joint: Secondary | ICD-10-CM | POA: Diagnosis not present

## 2023-10-28 DIAGNOSIS — M25561 Pain in right knee: Secondary | ICD-10-CM | POA: Diagnosis not present

## 2023-10-28 DIAGNOSIS — G8929 Other chronic pain: Secondary | ICD-10-CM | POA: Diagnosis not present

## 2023-10-30 DIAGNOSIS — G8929 Other chronic pain: Secondary | ICD-10-CM | POA: Diagnosis not present

## 2023-10-30 DIAGNOSIS — M25561 Pain in right knee: Secondary | ICD-10-CM | POA: Diagnosis not present

## 2023-10-30 DIAGNOSIS — Z96651 Presence of right artificial knee joint: Secondary | ICD-10-CM | POA: Diagnosis not present

## 2023-10-31 ENCOUNTER — Other Ambulatory Visit: Payer: Self-pay | Admitting: Family

## 2023-11-05 DIAGNOSIS — M25561 Pain in right knee: Secondary | ICD-10-CM | POA: Diagnosis not present

## 2023-11-05 DIAGNOSIS — Z96651 Presence of right artificial knee joint: Secondary | ICD-10-CM | POA: Diagnosis not present

## 2023-11-05 DIAGNOSIS — G8929 Other chronic pain: Secondary | ICD-10-CM | POA: Diagnosis not present

## 2023-11-11 ENCOUNTER — Other Ambulatory Visit: Payer: Self-pay | Admitting: Family

## 2023-11-27 ENCOUNTER — Ambulatory Visit (INDEPENDENT_AMBULATORY_CARE_PROVIDER_SITE_OTHER): Admitting: *Deleted

## 2023-11-27 ENCOUNTER — Ambulatory Visit

## 2023-11-27 VITALS — Ht 68.0 in | Wt 213.0 lb

## 2023-11-27 DIAGNOSIS — G8929 Other chronic pain: Secondary | ICD-10-CM | POA: Diagnosis not present

## 2023-11-27 DIAGNOSIS — Z0000 Encounter for general adult medical examination without abnormal findings: Secondary | ICD-10-CM

## 2023-11-27 DIAGNOSIS — M25561 Pain in right knee: Secondary | ICD-10-CM | POA: Diagnosis not present

## 2023-11-27 DIAGNOSIS — Z1239 Encounter for other screening for malignant neoplasm of breast: Secondary | ICD-10-CM

## 2023-11-27 DIAGNOSIS — Z96651 Presence of right artificial knee joint: Secondary | ICD-10-CM | POA: Diagnosis not present

## 2023-11-27 NOTE — Patient Instructions (Signed)
 [name] , Thank you for taking time out of your busy schedule to complete your Annual Wellness Visit with me. I enjoyed our conversation and look forward to speaking with you again next year. I, as well as your care team,  appreciate your ongoing commitment to your health goals. Please review the following plan we discussed and let me know if I can assist you in the future. Your Game plan/ To Do List   Referrals: If you haven't heard from the office you've been referred to, please reach out to them at the phone provided.   Mammogram: [id number]  Follow up Visits: Next Medicare AWV with our clinical staff: 12/01/24 2:20pm, telephone.    Next Office Visit with your provider: 12/03/23 9:20am  Clinician Recommendations:  Aim for 30 minutes of exercise or brisk walking, 6-8 glasses of water , and 5 servings of fruits and vegetables each day.   You will need to get the following vaccines at your local pharmacy: tetanus booster.     This is a list of the screening recommended for you and due dates:  Health Maintenance  Topic Date Due   DTaP/Tdap/Td vaccine (2 - Td or Tdap) 11/07/2021   Complete foot exam   08/27/2023   Hemoglobin A1C  10/16/2023   Flu Shot  01/02/2024   COVID-19 Vaccine (7 - 2024-25 season) 02/05/2024   Yearly kidney health urinalysis for diabetes  04/17/2024   Mammogram  04/29/2024   Eye exam for diabetics  06/17/2024   Yearly kidney function blood test for diabetes  08/04/2024   Medicare Annual Wellness Visit  11/26/2024   Colon Cancer Screening  03/29/2027   Pneumococcal Vaccine for age over 7  Completed   DEXA scan (bone density measurement)  Completed   Hepatitis C Screening  Completed   Zoster (Shingles) Vaccine  Completed   Hepatitis B Vaccine  Aged Out   HPV Vaccine  Aged Out   Meningitis B Vaccine  Aged Out    Advanced directives: (Copy Requested) Please bring a copy of your health care power of attorney and living will to the office to be added to your  chart at your convenience. You can mail to [facility] [address] [place], [place] [address] or email to [email or web address] Advance Care Planning is important because it:  [x]  Makes sure you receive the medical care that is consistent with your values, goals, and preferences  [x]  It provides guidance to your family and loved ones and reduces their decisional burden about whether or not they are making the right decisions based on your wishes.  Follow the link provided in your after visit summary or read over the paperwork we have mailed to you to help you started getting your Advance Directives in place. If you need assistance in completing these, please reach out to us  so that we can help you!  See attachments for Preventive Care and Fall Prevention Tips.

## 2023-11-27 NOTE — Progress Notes (Addendum)
 Please attest this visit in the absence of patient primary care provider.    Subjective:   [name] is a 68 y.o. who presents for a Medicare Wellness preventive visit.  As a reminder, Annual Wellness Visits don't include a physical exam, and some assessments may be limited, especially if this visit is performed virtually. We may recommend an in-person follow-up visit with your provider if needed.  Visit Complete: Virtual I connected with  [name] on 11/27/23 by a audio enabled telemedicine application and verified that I am speaking with the correct person using two identifiers.  Patient Location: Home  Provider Location: Office/Clinic  I discussed the limitations of evaluation and management by telemedicine. The patient expressed understanding and agreed to proceed.  Vital Signs: Because this visit was a virtual/telehealth visit, some criteria may be missing or patient reported. Any vitals not documented were not able to be obtained and vitals that have been documented are patient reported.  VideoDeclined- This patient declined Librarian, academic. Therefore the visit was completed with audio only.  Persons Participating in Visit: Patient.  AWV Questionnaire: No: Patient Medicare AWV questionnaire was not completed prior to this visit.  Cardiac Risk Factors include: advanced age (>55men, >74 women);dyslipidemia;hypertension;obesity (BMI >30kg/m2);diabetes mellitus     Objective:    Today's Vitals   11/27/23 1407  Weight: 213 lb (96.6 kg)  Height: 5' 8 (1.727 m)   Body mass index is 32.39 kg/m.     11/27/2023    2:26 PM 03/21/2023    5:04 PM 03/12/2023    2:47 PM 03/12/2023    8:27 AM 09/11/2022   11:01 AM 05/21/2020    7:20 AM 03/09/2020    2:12 PM  Advanced Directives  Does Patient Have a Medical Advance Directive? Yes Yes No Yes Yes Yes Yes  Type of Estate agent of [place];Living will Healthcare Power  of [place];Living will Living will;Healthcare Power of [employer] Power of [place];Living will Healthcare Power of [place];Living will    Does patient want to make changes to medical advance directive? No - Patient declined No - Patient declined No - Patient declined No - Patient declined     Copy of Healthcare Power of Attorney in Chart? No - copy requested No - copy requested No - copy requested  No - copy requested    Would patient like information on creating a medical advance directive?   No - Patient declined        Current Medications (verified) Outpatient Encounter Medications as of 11/27/2023  Medication Sig   Ascorbic Acid  (VITAMIN C ) 100 MG tablet Take 100 mg by mouth daily.   aspirin  EC 81 MG tablet Take 1 tablet (81 mg total) by mouth 2 (two) times daily after a meal. Day after surgery (Patient taking differently: Take 81 mg by mouth daily.)   atorvastatin  (LIPITOR) 20 MG tablet Take 1 tablet (20 mg total) by mouth daily.   betamethasone  valerate ointment (VALISONE ) 0.1 % Apply 1 Application topically 2 (two) times daily. (Patient taking differently: Apply 1 Application topically 2 (two) times daily as needed (irritation).)   Calcium  Carbonate (CALTRATE 600 PO) Take 600 mg by mouth daily.   cholecalciferol  (VITAMIN D3) 25 MCG (1000 UNIT) tablet Take 1,000 Units by mouth daily.   docusate sodium  (COLACE) 100 MG capsule Take 1 capsule (100 mg total) by mouth 2 (two) times daily.   fexofenadine (ALLEGRA) 180 MG tablet Take 180 mg by mouth daily  as needed for allergies.   hydrochlorothiazide  (HYDRODIURIL ) 25 MG tablet Take 1 tablet (25 mg total) by mouth daily.   losartan  (COZAAR ) 25 MG tablet TAKE 1 TABLET(25 MG) BY MOUTH DAILY   MOUNJARO  7.5 MG/0.5ML Pen ADMINISTER 7.5 MG UNDER THE SKIN 1 TIME A WEEK   Multiple Vitamin (MULTIVITAMIN WITH MINERALS) TABS tablet Take 1 tablet by mouth daily.   omeprazole  (PRILOSEC) 40 MG capsule TAKE 1 CAPSULE(40 MG) BY MOUTH DAILY AS NEEDED    polyethylene glycol (MIRALAX  / GLYCOLAX ) 17 g packet Take 17 g by mouth daily.   Probiotic Product (PROBIOTIC DAILY PO) Take 1 capsule by mouth daily.   senna (SENOKOT) 8.6 MG tablet Take 5 tablets by mouth at bedtime. (Patient taking differently: Take 5 tablets by mouth at bedtime as needed.)   timolol  (TIMOPTIC ) 0.5 % ophthalmic solution Place 1 drop into both eyes 2 (two) times daily.   Plant Sterol Stanol-Pantethine (CHOLEST OFF COMPLETE PO) Take 1 capsule by mouth daily. (Patient not taking: Reported on 11/27/2023)   [DISCONTINUED] methocarbamol  (ROBAXIN ) 500 MG tablet Take 1 tablet (500 mg total) by mouth every 8 (eight) hours as needed for muscle spasms. (Patient not taking: Reported on 11/27/2023)   [DISCONTINUED] oxyCODONE  (OXY IR/ROXICODONE ) 5 MG immediate release tablet Take 1 tablet (5 mg total) by mouth every 4 (four) hours as needed for severe pain (pain score 7-10). (Patient not taking: Reported on 11/27/2023)   No facility-administered encounter medications on file as of 11/27/2023.    Allergies (verified) Metformin  and related, Rosuvastatin , and Lisinopril    History: Past Medical History:  Diagnosis Date   Allergy    SEASONAL   Arthritis    Back pain    Benign fundic gland polyps of stomach    Cataract    EARL STAGES   Constipation    COVID-19 09/04/2022   Esophagitis    LA Class B   Family history of adverse reaction to anesthesia    Maternal aunt died on the table at age 62 while having a hysterectomy. only had hx of DM.   Fatigue    Fatty liver    Fibromyalgia    Gallbladder problem    GERD (gastroesophageal reflux disease)    Heart murmur    History of arthroscopy of right knee    Hyperlipidemia    Hypertension    IBS (irritable bowel syndrome)    Joint pain    Neuromuscular disorder (HCC)    Pneumonia    Pre-diabetes    Past Surgical History:  Procedure Laterality Date   CHOLECYSTECTOMY  2009   low GB EF, no gallstones   COLONOSCOPY     EYE  SURGERY Bilateral    cataract surgery w/ IOL   knee arthroscopy right  04/22/2022   LAPAROSCOPY     of adhesions/ of ovaries   TOTAL KNEE ARTHROPLASTY Right 03/21/2023   Procedure: TOTAL KNEE ARTHROPLASTY;  Surgeon: Duwayne Purchase, MD;  Location: [facility];  Service: Orthopedics;  Laterality: Right;  150 min   UPPER GASTROINTESTINAL ENDOSCOPY     VAGINAL HYSTERECTOMY     Family History  Problem Relation Age of Onset   Heart disease Mother    Hypertension Mother    Hyperlipidemia Mother    Obesity Mother    Colon cancer Father    Hypertension Father    Hyperlipidemia Father    Obesity Father    Colon polyps Sister    Diabetes Sister    Diabetes Brother  Half   Heart disease Brother    Hypertension Brother    Sickle cell anemia Maternal Grandmother    Breast cancer Paternal Grandmother    Sickle cell trait Son    Esophageal cancer Neg Hx    Stomach cancer Neg Hx    Rectal cancer Neg Hx    Crohn's disease Neg Hx    Ulcerative colitis Neg Hx    Social History   Socioeconomic History   Marital status: Single    Spouse name: Not on file   Number of children: Not on file   Years of education: Not on file   Highest education level: Bachelor's degree (e.g., BA, AB, BS)  Occupational History   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  Tobacco Use   Smoking status: Former    Current packs/day: 0.00    Average packs/day: 0.1 packs/day for 10.0 years (1.0 ttl pk-yrs)    Types: Cigarettes    Start date: 07/15/1971    Quit date: 07/14/1981    Years since quitting: 42.4    Passive exposure: Never   Smokeless tobacco: Never  Vaping Use   Vaping status: Never Used  Substance and Sexual Activity   Alcohol use: No   Drug use: No   Sexual activity: Not Currently  Other Topics Concern   Not on file  Social History Narrative   Widowed- husband passed last year   2 grown sons, grandchildren   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, Ship broker- retired   No pets    Enjoys writing, reading, being a grandmother, walking   Dietitian about christian books for self improvement for women   Completed college   Social Drivers of Corporate investment banker Strain: Low Risk  (11/27/2023)   Overall Financial Resource Strain (CARDIA)    Difficulty of Paying Living Expenses: Not hard at all  Food Insecurity: No Food Insecurity (11/27/2023)   Hunger Vital Sign    Worried About Running Out of Food in the Last Year: Never true    Ran Out of Food in the Last Year: Never true  Recent Concern: Food Insecurity - Food Insecurity Present (09/22/2023)   Hunger Vital Sign    Worried About Running Out of Food in the Last Year: Sometimes true    Ran Out of Food in the Last Year: Sometimes true  Transportation Needs: No Transportation Needs (11/27/2023)   PRAPARE - Administrator, Civil Service (Medical): No    Lack of Transportation (Non-Medical): No  Physical Activity: Insufficiently Active (11/27/2023)   Exercise Vital Sign    Days of Exercise per Week: 7 days    Minutes of Exercise per Session: 20 min  Stress: No Stress Concern Present (11/27/2023)   [employer] of Occupational Health - Occupational Stress Questionnaire    Feeling of Stress: Not at all  Social Connections: Moderately Integrated (11/27/2023)   Social Connection and Isolation Panel    Frequency of Communication with Friends and Family: More than three times a week    Frequency of Social Gatherings with Friends and Family: More than three times a week    Attends Religious Services: More than 4 times per year    Active Member of [employer] or Organizations: Yes    Attends Banker Meetings: More than 4 times per year    Marital Status: Widowed    Tobacco Counseling Counseling given: Not Answered    Clinical Intake:  Pre-visit preparation completed: Yes  Pain :  No/denies pain     BMI - recorded: 32.39 Nutritional Status: BMI > 30  Obese Nutritional Risks: None Diabetes:  Yes  Lab Results  Component Value Date   HGBA1C 5.9 04/18/2023   HGBA1C 6.3 12/25/2022   HGBA1C 7.0 (H) 08/27/2022     How often do you need to have someone help you when you read instructions, pamphlets, or other written materials from your doctor or pharmacy?: 1 - Never What is the last grade level you completed in school?: Bachelor's degree  Interpreter Needed?: No  Information entered by :: Lolita Libra, CMA   Activities of Daily Living     11/27/2023    2:19 PM 03/21/2023    5:04 PM  In your present state of health, do you have any difficulty performing the following activities:  Hearing? 0 0  Vision? 0 0  Comment Has glaucoma and gets frequent exams.   Difficulty concentrating or making decisions? 0 0  Walking or climbing stairs? 0   Dressing or bathing? 0   Doing errands, shopping? 0 0  Preparing Food and eating ? N   Using the Toilet? N   In the past six months, have you accidently leaked urine? N   Do you have problems with loss of bowel control? N   Managing your Medications? N   Managing your Finances? N   Housekeeping or managing your Housekeeping? N     Patient Care Team: O'Sullivan, Melissa, NP as PCP - General (Internal Medicine) [place], [facility]  I have updated your Care Teams any recent Medical Services you may have received from other providers in the past year.     Assessment:   This is a routine wellness examination for [name].  Hearing/Vision screen Hearing Screening - Comments:: Denies hearing difficulties.  Vision Screening - Comments:: Last eye exam  06/18/23, 09/2023 with [facility].    Goals Addressed               This Visit's Progress     Patient Stated (pt-stated)        Wants to continue eating healthy and drinking more water .       Depression Screen     11/27/2023    2:25 PM 09/11/2022   11:04 AM 08/27/2022    8:11 AM 07/18/2022   10:30 AM 10/05/2021    9:32 AM 04/17/2021    8:57 AM  03/09/2020    2:12 PM  PHQ 2/9 Scores  PHQ - 2 Score 0 0 0 0 0 0 0  PHQ- 9 Score   0 2  4     Fall Risk     11/27/2023    2:16 PM 09/11/2022   11:01 AM 08/27/2022    8:11 AM 07/18/2022   10:06 AM 04/29/2022    9:39 AM  Fall Risk   Falls in the past year? 1 0 0 0 0  Number falls in past yr: 0 0 0 0 0  Injury with Fall? 1 0 0 0 0  Risk for fall due to : Orthopedic patient No Fall Risks No Fall Risks    Follow up Education provided Falls evaluation completed Falls evaluation completed Falls evaluation completed     MEDICARE RISK AT HOME:  Medicare Risk at Home Any stairs in or around the home?: Yes If so, are there any without handrails?: No Home free of loose throw rugs in walkways, pet beds, electrical cords, etc?: Yes Adequate lighting in your  home to reduce risk of falls?: Yes Life alert?: No Use of a cane, walker or w/c?: No Grab bars in the bathroom?: Yes Shower chair or bench in shower?: Yes Elevated toilet seat or a handicapped toilet?: Yes  TIMED UP AND GO:  Was the test performed?  No,audio  Cognitive Function: 6CIT completed        11/27/2023    2:27 PM 09/11/2022   11:09 AM  6CIT Screen  What Year? 0 points 0 points  What month? 0 points 0 points  What time? 0 points 0 points  Count back from 20 0 points 0 points  Months in reverse 0 points 0 points  Repeat phrase 0 points 4 points  Total Score 0 points 4 points    Immunizations Immunization History  Administered Date(s) Administered   Fluad Quad(high Dose 65+) 02/18/2020, 04/17/2021, 03/04/2023   Influenza Split 05/06/2012, 03/03/2013, 03/03/2022   Influenza Whole 03/23/2009   Influenza,inj,Quad PF,6+ Mos 04/01/2019   Influenza,inj,quad, With Preservative 04/02/2017   Moderna Covid-19 Vaccine Bivalent Booster 14yrs & up 05/15/2021, 02/21/2022   Moderna Sars-Covid-2 Vaccination 07/16/2019, 08/13/2019, 03/30/2020   PNEUMOCOCCAL CONJUGATE-20 05/15/2021   Pfizer(Comirnaty )Fall Seasonal Vaccine 12 years  and older 08/05/2023   Pneumococcal Polysaccharide-23 10/13/2018   Tdap 11/08/2011   Zoster Recombinant(Shingrix) 05/25/2019, 03/20/2020   Zoster, Live 10/16/2015    Screening Tests Health Maintenance  Topic Date Due   DTaP/Tdap/Td (2 - Td or Tdap) 11/07/2021   FOOT EXAM  08/27/2023   HEMOGLOBIN A1C  10/16/2023   INFLUENZA VACCINE  01/02/2024   COVID-19 Vaccine (7 - 2024-25 season) 02/05/2024   Diabetic kidney evaluation - Urine ACR  04/17/2024   MAMMOGRAM  04/29/2024   OPHTHALMOLOGY EXAM  06/17/2024   Diabetic kidney evaluation - eGFR measurement  08/04/2024   Medicare Annual Wellness (AWV)  11/26/2024   Colonoscopy  03/29/2027   Pneumococcal Vaccine: 50+ Years  Completed   DEXA SCAN  Completed   Hepatitis C Screening  Completed   Zoster Vaccines- Shingrix  Completed   Hepatitis B Vaccines  Aged Out   HPV VACCINES  Aged Out   Meningococcal B Vaccine  Aged Out    Health Maintenance  Health Maintenance Due  Topic Date Due   DTaP/Tdap/Td (2 - Td or Tdap) 11/07/2021   FOOT EXAM  08/27/2023   HEMOGLOBIN A1C  10/16/2023   Health Maintenance Items Addressed: Will get tetanus booster at local pharmacy. Will get DM foot exam and A1c and OV in July.  Additional Screening:  Vision Screening: Recommended annual ophthalmology exams for early detection of glaucoma and other disorders of the eye. Would you like a referral to an eye doctor? No    Dental Screening: Recommended annual dental exams for proper oral hygiene  Community Resource Referral / Chronic Care Management: CRR required this visit?  No   CCM required this visit?  No   Plan:    I have personally reviewed and noted the following in the patient's chart:   Medical and social history Use of alcohol, tobacco or illicit drugs  Current medications and supplements including opioid prescriptions. Patient is not currently taking opioid prescriptions. Functional ability and status Nutritional status Physical  activity Advanced directives List of other physicians Hospitalizations, surgeries, and ER visits in previous 12 months Vitals Screenings to include cognitive, depression, and falls Referrals and appointments  In addition, I have reviewed and discussed with patient certain preventive protocols, quality metrics, and best practice recommendations. A written personalized care plan for  preventive services as well as general preventive health recommendations were provided to patient.   Lolita Libra, CMA   11/27/2023   After Visit Summary: (MyChart) Due to this being a telephonic visit, the after visit summary with patients personalized plan was offered to patient via MyChart   Notes: Nothing significant to report at this time.

## 2023-12-02 DIAGNOSIS — M25561 Pain in right knee: Secondary | ICD-10-CM | POA: Diagnosis not present

## 2023-12-02 DIAGNOSIS — Z96651 Presence of right artificial knee joint: Secondary | ICD-10-CM | POA: Diagnosis not present

## 2023-12-02 DIAGNOSIS — G8929 Other chronic pain: Secondary | ICD-10-CM | POA: Diagnosis not present

## 2023-12-03 ENCOUNTER — Ambulatory Visit: Admitting: Family

## 2023-12-04 DIAGNOSIS — G8929 Other chronic pain: Secondary | ICD-10-CM | POA: Diagnosis not present

## 2023-12-04 DIAGNOSIS — Z96651 Presence of right artificial knee joint: Secondary | ICD-10-CM | POA: Diagnosis not present

## 2023-12-04 DIAGNOSIS — M25561 Pain in right knee: Secondary | ICD-10-CM | POA: Diagnosis not present

## 2023-12-10 DIAGNOSIS — G8929 Other chronic pain: Secondary | ICD-10-CM | POA: Diagnosis not present

## 2023-12-10 DIAGNOSIS — Z96651 Presence of right artificial knee joint: Secondary | ICD-10-CM | POA: Diagnosis not present

## 2023-12-10 DIAGNOSIS — M25561 Pain in right knee: Secondary | ICD-10-CM | POA: Diagnosis not present

## 2023-12-12 ENCOUNTER — Ambulatory Visit: Admitting: Family

## 2023-12-12 ENCOUNTER — Ambulatory Visit: Payer: Self-pay | Admitting: Family

## 2023-12-12 VITALS — BP 110/60 | HR 79 | Temp 98.2°F | Ht 68.0 in | Wt 212.0 lb

## 2023-12-12 DIAGNOSIS — E559 Vitamin D deficiency, unspecified: Secondary | ICD-10-CM

## 2023-12-12 DIAGNOSIS — K219 Gastro-esophageal reflux disease without esophagitis: Secondary | ICD-10-CM | POA: Diagnosis not present

## 2023-12-12 DIAGNOSIS — Z6832 Body mass index (BMI) 32.0-32.9, adult: Secondary | ICD-10-CM

## 2023-12-12 DIAGNOSIS — I10 Essential (primary) hypertension: Secondary | ICD-10-CM | POA: Diagnosis not present

## 2023-12-12 DIAGNOSIS — E1129 Type 2 diabetes mellitus with other diabetic kidney complication: Secondary | ICD-10-CM

## 2023-12-12 DIAGNOSIS — K5904 Chronic idiopathic constipation: Secondary | ICD-10-CM

## 2023-12-12 DIAGNOSIS — L659 Nonscarring hair loss, unspecified: Secondary | ICD-10-CM | POA: Diagnosis not present

## 2023-12-12 DIAGNOSIS — E785 Hyperlipidemia, unspecified: Secondary | ICD-10-CM | POA: Diagnosis not present

## 2023-12-12 DIAGNOSIS — M51369 Other intervertebral disc degeneration, lumbar region without mention of lumbar back pain or lower extremity pain: Secondary | ICD-10-CM | POA: Diagnosis not present

## 2023-12-12 DIAGNOSIS — H409 Unspecified glaucoma: Secondary | ICD-10-CM | POA: Diagnosis not present

## 2023-12-12 DIAGNOSIS — F419 Anxiety disorder, unspecified: Secondary | ICD-10-CM

## 2023-12-12 DIAGNOSIS — M797 Fibromyalgia: Secondary | ICD-10-CM | POA: Diagnosis not present

## 2023-12-12 DIAGNOSIS — Z23 Encounter for immunization: Secondary | ICD-10-CM | POA: Diagnosis not present

## 2023-12-12 DIAGNOSIS — R809 Proteinuria, unspecified: Secondary | ICD-10-CM | POA: Diagnosis not present

## 2023-12-12 DIAGNOSIS — Z7985 Long-term (current) use of injectable non-insulin antidiabetic drugs: Secondary | ICD-10-CM | POA: Diagnosis not present

## 2023-12-12 DIAGNOSIS — F32A Depression, unspecified: Secondary | ICD-10-CM

## 2023-12-12 DIAGNOSIS — E66811 Obesity, class 1: Secondary | ICD-10-CM

## 2023-12-12 DIAGNOSIS — D649 Anemia, unspecified: Secondary | ICD-10-CM | POA: Diagnosis not present

## 2023-12-12 LAB — CBC WITH DIFFERENTIAL/PLATELET
Basophils Absolute: 0 K/uL (ref 0.0–0.1)
Basophils Relative: 0.4 % (ref 0.0–3.0)
Eosinophils Absolute: 0.1 K/uL (ref 0.0–0.7)
Eosinophils Relative: 1.6 % (ref 0.0–5.0)
HCT: 39.2 % (ref 36.0–46.0)
Hemoglobin: 13.6 g/dL (ref 12.0–15.0)
Lymphocytes Relative: 47.2 % — ABNORMAL HIGH (ref 12.0–46.0)
Lymphs Abs: 2 K/uL (ref 0.7–4.0)
MCHC: 34.7 g/dL (ref 30.0–36.0)
MCV: 86.5 fl (ref 78.0–100.0)
Monocytes Absolute: 0.5 K/uL (ref 0.1–1.0)
Monocytes Relative: 11.7 % (ref 3.0–12.0)
Neutro Abs: 1.7 K/uL (ref 1.4–7.7)
Neutrophils Relative %: 39.1 % — ABNORMAL LOW (ref 43.0–77.0)
Platelets: 222 K/uL (ref 150.0–400.0)
RBC: 4.53 Mil/uL (ref 3.87–5.11)
RDW: 15.2 % (ref 11.5–15.5)
WBC: 4.3 K/uL (ref 4.0–10.5)

## 2023-12-12 LAB — COMPREHENSIVE METABOLIC PANEL WITH GFR
ALT: 15 U/L (ref 0–35)
AST: 9 U/L (ref 0–37)
Albumin: 4.4 g/dL (ref 3.5–5.2)
Alkaline Phosphatase: 80 U/L (ref 39–117)
BUN: 19 mg/dL (ref 6–23)
CO2: 34 meq/L — ABNORMAL HIGH (ref 19–32)
Calcium: 10 mg/dL (ref 8.4–10.5)
Chloride: 98 meq/L (ref 96–112)
Creatinine, Ser: 0.78 mg/dL (ref 0.40–1.20)
GFR: 77.71 mL/min (ref >60.00)
Glucose, Bld: 95 mg/dL (ref 70–99)
Potassium: 4.1 meq/L (ref 3.5–5.1)
Sodium: 138 meq/L (ref 135–145)
Total Bilirubin: 0.6 mg/dL (ref 0.2–1.2)
Total Protein: 7.4 g/dL (ref 6.0–8.3)

## 2023-12-12 LAB — IBC + FERRITIN
Ferritin: 108.7 ng/mL (ref 10.0–291.0)
Iron: 77 ug/dL (ref 42–145)
Saturation Ratios: 20.4 % (ref 20.0–50.0)
TIBC: 378 ug/dL (ref 250.0–450.0)
Transferrin: 270 mg/dL (ref 212.0–360.0)

## 2023-12-12 LAB — TSH: TSH: 0.42 u[IU]/mL (ref 0.35–5.50)

## 2023-12-12 LAB — MICROALBUMIN / CREATININE URINE RATIO
Creatinine,U: 161.3 mg/dL
Microalb Creat Ratio: 11.1 mg/g (ref 0.0–30.0)
Microalb, Ur: 1.8 mg/dL (ref 0.0–1.9)

## 2023-12-12 LAB — T4, FREE: Free T4: 0.98 ng/dL (ref 0.60–1.60)

## 2023-12-12 LAB — HEMOGLOBIN A1C: Hgb A1c MFr Bld: 6 % (ref 4.6–6.5)

## 2023-12-12 LAB — VITAMIN B12: Vitamin B-12: 916 pg/mL — ABNORMAL HIGH (ref 211–911)

## 2023-12-12 MED ORDER — LOSARTAN POTASSIUM 25 MG PO TABS: 25.0000 mg | ORAL_TABLET | Freq: Every day | ORAL | 1 refills | Status: DC

## 2023-12-12 MED ORDER — HYDROCHLOROTHIAZIDE 25 MG PO TABS: 25.0000 mg | ORAL_TABLET | Freq: Every day | ORAL | 1 refills | Status: DC

## 2023-12-12 MED ORDER — OMEPRAZOLE 40 MG PO CPDR: 40.0000 mg | DELAYED_RELEASE_CAPSULE | Freq: Every day | ORAL | 1 refills | Status: DC

## 2023-12-12 MED ORDER — TIRZEPATIDE 10 MG/0.5ML ~~LOC~~ SOAJ: 10.0000 mg | SUBCUTANEOUS | 0 refills | Status: DC

## 2023-12-12 NOTE — Assessment & Plan Note (Signed)
 Lab Results  Component Value Date   HGBA1C 5.9 04/18/2023   Clinically stable but having increased appetite. Will increase from 7.5mg  tp 10mg  of moujaro to assist with weight loss.

## 2023-12-12 NOTE — Assessment & Plan Note (Signed)
 Intermittent symptoms- managing.

## 2023-12-12 NOTE — Assessment & Plan Note (Signed)
Stable on omeprazole. Continue same.  ?

## 2023-12-12 NOTE — Assessment & Plan Note (Signed)
 Reports back pain is stable- exercising.

## 2023-12-12 NOTE — Assessment & Plan Note (Signed)
 Stable on 1000 international units of vit D Daily. Continue same.

## 2023-12-12 NOTE — Assessment & Plan Note (Signed)
 Lab Results  Component Value Date   CHOL 277 (H) 08/05/2023   HDL 50.30 08/05/2023   LDLCALC 178 (H) 08/05/2023   LDLDIRECT 174.0 05/25/2019   TRIG 244.0 (H) 08/05/2023   CHOLHDL 6 08/05/2023   Started atorvastatin , update lipid panel. Goal LDL<70.

## 2023-12-12 NOTE — Assessment & Plan Note (Signed)
 Reports stable mood

## 2023-12-12 NOTE — Assessment & Plan Note (Signed)
 On Timolol - her her eye doctor.

## 2023-12-12 NOTE — Progress Notes (Signed)
 Subjective:     Patient ID: [name], female    DOB: 1955-07-08, 68 y.o.   MRN: [id number]  Chief Complaint  Patient presents with   Diabetes    34mo follow up; doing okay, no concerns    HPI  Discussed the use of AI scribe software for clinical note transcription with the patient, who gave verbal consent to proceed.  History of Present Illness   [name] is a 68 year old female with type 2 diabetes who presents for medication follow-up.  She manages her type 2 diabetes with Mounjaro  7.5 mg. Initially, it was effective in suppressing appetite and aiding weight loss, but these effects have plateaued, and her hunger has returned. Her weight remains stable, and her last A1c in November was 5.9%.  She experiences fibromyalgia pain, primarily at night, which sometimes disrupts her sleep. The feels pain is muscular rather than nerve-related. She also has back pain, which improves with increased exercise without the need for pain medication.  She experiences constipation and uses Prunolax, Miralax , Senokot, and mineral oil daily. Linzess  was previously tried without success due to side effects and cost.  She notes thinning hair, which she attributes to aging. Her thyroid  was slightly overactive at one point, but she is not on any thyroid  medication.  Her mood is stable, with no depression or anxiety. She is currently taking hydrochlorothiazide , losartan , atorvastatin , and omeprazole . She does not require Allegra at this time of year and takes over-the-counter vitamin D  at 1000 units daily. She uses timolol  eye drops for glaucoma.    Health Maintenance Due  Topic Date Due   DTaP/Tdap/Td (5 - Td or Tdap) 11/07/2021    Past Medical History:  Diagnosis Date   Allergy    SEASONAL   Arthritis    Back pain    Benign fundic gland polyps of stomach    Cataract    EARL STAGES   Constipation    COVID-19 09/04/2022   Esophagitis    LA Class B   Family history of adverse  reaction to anesthesia    Maternal aunt died on the table at age 31 while having a hysterectomy. only had hx of DM.   Fatigue    Fatty liver    Fibromyalgia    Gallbladder problem    GERD (gastroesophageal reflux disease)    Glaucoma    Heart murmur    History of arthroscopy of right knee    Hyperlipidemia    Hypertension    IBS (irritable bowel syndrome)    Joint pain    Neuromuscular disorder (HCC)    Pneumonia    Pre-diabetes     Past Surgical History:  Procedure Laterality Date   CHOLECYSTECTOMY  2009   low GB EF, no gallstones   COLONOSCOPY     EYE SURGERY Bilateral    cataract surgery w/ IOL   knee arthroscopy right  04/22/2022   LAPAROSCOPY     of adhesions/ of ovaries   TOTAL KNEE ARTHROPLASTY Right 03/21/2023   Procedure: TOTAL KNEE ARTHROPLASTY;  Surgeon: Duwayne Purchase, MD;  Location: [facility];  Service: Orthopedics;  Laterality: Right;  150 min   UPPER GASTROINTESTINAL ENDOSCOPY     VAGINAL HYSTERECTOMY      Family History  Problem Relation Age of Onset   Heart disease Mother    Hypertension Mother    Hyperlipidemia Mother    Obesity Mother    Colon cancer Father    Hypertension Father  Hyperlipidemia Father    Obesity Father    Colon polyps Sister    Diabetes Sister    Diabetes Brother        Half   Heart disease Brother    Hypertension Brother    Sickle cell anemia Maternal Grandmother    Breast cancer Paternal Grandmother    Sickle cell trait Son    Esophageal cancer Neg Hx    Stomach cancer Neg Hx    Rectal cancer Neg Hx    Crohn's disease Neg Hx    Ulcerative colitis Neg Hx     Social History   Socioeconomic History   Marital status: Single    Spouse name: Not on file   Number of children: Not on file   Years of education: Not on file   Highest education level: Bachelor's degree (e.g., BA, AB, BS)  Occupational History   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  Tobacco Use   Smoking status: Former    Current packs/day:  0.00    Average packs/day: 0.1 packs/day for 10.0 years (1.0 ttl pk-yrs)    Types: Cigarettes    Start date: 07/15/1971    Quit date: 07/14/1981    Years since quitting: 42.4    Passive exposure: Never   Smokeless tobacco: Never  Vaping Use   Vaping status: Never Used  Substance and Sexual Activity   Alcohol use: No   Drug use: No   Sexual activity: Not Currently  Other Topics Concern   Not on file  Social History Narrative   Widowed- husband passed last year   2 grown sons, grandchildren   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, Ship broker- retired   No pets   Enjoys writing, reading, being a grandmother, walking   Dietitian about christian books for self improvement for women   Completed college   Social Drivers of Corporate investment banker Strain: Low Risk  (12/12/2023)   Overall Financial Resource Strain (CARDIA)    Difficulty of Paying Living Expenses: Not very hard  Food Insecurity: No Food Insecurity (12/12/2023)   Hunger Vital Sign    Worried About Running Out of Food in the Last Year: Never true    Ran Out of Food in the Last Year: Never true  Recent Concern: Food Insecurity - Food Insecurity Present (09/22/2023)   Hunger Vital Sign    Worried About Running Out of Food in the Last Year: Sometimes true    Ran Out of Food in the Last Year: Sometimes true  Transportation Needs: No Transportation Needs (12/12/2023)   PRAPARE - Administrator, Civil Service (Medical): No    Lack of Transportation (Non-Medical): No  Physical Activity: Insufficiently Active (12/12/2023)   Exercise Vital Sign    Days of Exercise per Week: 7 days    Minutes of Exercise per Session: 20 min  Stress: No Stress Concern Present (12/12/2023)   [employer] of Occupational Health - Occupational Stress Questionnaire    Feeling of Stress: Not at all  Social Connections: Moderately Integrated (12/12/2023)   Social Connection and Isolation Panel    Frequency of Communication with  Friends and Family: More than three times a week    Frequency of Social Gatherings with Friends and Family: More than three times a week    Attends Religious Services: More than 4 times per year    Active Member of [employer] or Organizations: Yes    Attends Banker Meetings: 1 to 4  times per year    Marital Status: Widowed  Intimate Partner Violence: Not At Risk (11/27/2023)   Humiliation, Afraid, Rape, and Kick questionnaire    Fear of Current or Ex-Partner: No    Emotionally Abused: No    Physically Abused: No    Sexually Abused: No    Outpatient Medications Prior to Visit  Medication Sig Dispense Refill   Ascorbic Acid  (VITAMIN C ) 100 MG tablet Take 100 mg by mouth daily.     aspirin  EC 81 MG tablet Take 1 tablet (81 mg total) by mouth 2 (two) times daily after a meal. Day after surgery (Patient taking differently: Take 81 mg by mouth daily.) 60 tablet 1   atorvastatin  (LIPITOR) 20 MG tablet Take 1 tablet (20 mg total) by mouth daily. 90 tablet 0   betamethasone  valerate ointment (VALISONE ) 0.1 % Apply 1 Application topically 2 (two) times daily. (Patient taking differently: Apply 1 Application topically 2 (two) times daily as needed (irritation).) 30 g 0   Calcium  Carbonate (CALTRATE 600 PO) Take 600 mg by mouth daily.     cholecalciferol  (VITAMIN D3) 25 MCG (1000 UNIT) tablet Take 1,000 Units by mouth daily.     docusate sodium  (COLACE) 100 MG capsule Take 1 capsule (100 mg total) by mouth 2 (two) times daily. 60 capsule 2   fexofenadine (ALLEGRA) 180 MG tablet Take 180 mg by mouth daily as needed for allergies.     meloxicam  (MOBIC ) 15 MG tablet Take 15 mg by mouth daily.     Multiple Vitamin (MULTIVITAMIN WITH MINERALS) TABS tablet Take 1 tablet by mouth daily.     polyethylene glycol (MIRALAX  / GLYCOLAX ) 17 g packet Take 17 g by mouth daily. 14 each 0   Probiotic Product (PROBIOTIC DAILY PO) Take 1 capsule by mouth daily.     senna (SENOKOT) 8.6 MG tablet Take 5 tablets by  mouth at bedtime. (Patient taking differently: Take 5 tablets by mouth at bedtime as needed.)     timolol  (TIMOPTIC ) 0.5 % ophthalmic solution Place 1 drop into both eyes 2 (two) times daily.     hydrochlorothiazide  (HYDRODIURIL ) 25 MG tablet Take 1 tablet (25 mg total) by mouth daily.     losartan  (COZAAR ) 25 MG tablet TAKE 1 TABLET(25 MG) BY MOUTH DAILY 90 tablet 0   MOUNJARO  7.5 MG/0.5ML Pen ADMINISTER 7.5 MG UNDER THE SKIN 1 TIME A WEEK 6 mL 1   omeprazole  (PRILOSEC) 40 MG capsule TAKE 1 CAPSULE(40 MG) BY MOUTH DAILY AS NEEDED 90 capsule 1   Plant Sterol Stanol-Pantethine (CHOLEST OFF COMPLETE PO) Take 1 capsule by mouth daily. (Patient not taking: Reported on 12/12/2023)     No facility-administered medications prior to visit.    Allergies  Allergen Reactions   Metformin  And Related     abd pain   Rosuvastatin  Other (See Comments)    Muscle aches   Lisinopril      headache    ROS See HPI    Objective:    Physical Exam Constitutional:      General: She is not in acute distress.    Appearance: Normal appearance. She is well-developed.  HENT:     Head: Normocephalic and atraumatic.     Right Ear: External ear normal.     Left Ear: External ear normal.  Eyes:     General: No scleral icterus. Neck:     Thyroid : No thyromegaly.  Cardiovascular:     Rate and Rhythm: Normal rate and regular rhythm.  Heart sounds: Normal heart sounds. No murmur heard. Pulmonary:     Effort: Pulmonary effort is normal. No respiratory distress.     Breath sounds: Normal breath sounds. No wheezing.  Musculoskeletal:     Cervical back: Neck supple.  Skin:    General: Skin is warm and dry.  Neurological:     Mental Status: She is alert and oriented to person, place, and time.  Psychiatric:        Mood and Affect: Mood normal.        Behavior: Behavior normal.        Thought Content: Thought content normal.        Judgment: Judgment normal.    Diabetic Foot Exam - Simple   Simple Foot  Form Diabetic Foot exam was performed with the following findings: Yes 12/12/2023  1:44 PM  Visual Inspection No deformities, no ulcerations, no other skin breakdown bilaterally: Yes Sensation Testing Intact to touch and monofilament testing bilaterally: Yes Pulse Check Posterior Tibialis and Dorsalis pulse intact bilaterally: Yes Comments      BP 110/60   Pulse 79   Temp 98.2 F (36.8 C)   Ht 5' 8 (1.727 m)   Wt 212 lb (96.2 kg)   SpO2 98%   BMI 32.23 kg/m  Wt Readings from Last 3 Encounters:  12/12/23 212 lb (96.2 kg)  11/27/23 213 lb (96.6 kg)  08/05/23 213 lb (96.6 kg)         Assessment & Plan:   Problem List Items Addressed This Visit       Unprioritized   Vitamin D  deficiency disease   Stable on 1000 international units of vit D Daily. Continue same.      HYPERTENSION, BENIGN ESSENTIAL   BP Readings from Last 3 Encounters:  12/12/23 110/60  08/05/23 (!) 101/50  04/18/23 (!) 111/51   Stable on hydrochlorothiazide  and losartan -continue same.      Relevant Medications   hydrochlorothiazide  (HYDRODIURIL ) 25 MG tablet   losartan  (COZAAR ) 25 MG tablet   Hyperlipidemia with target LDL less than 100   Lab Results  Component Value Date   CHOL 277 (H) 08/05/2023   HDL 50.30 08/05/2023   LDLCALC 178 (H) 08/05/2023   LDLDIRECT 174.0 05/25/2019   TRIG 244.0 (H) 08/05/2023   CHOLHDL 6 08/05/2023   Started atorvastatin , update lipid panel. Goal LDL<70.        Relevant Medications   hydrochlorothiazide  (HYDRODIURIL ) 25 MG tablet   losartan  (COZAAR ) 25 MG tablet   Glaucoma   On Timolol - her her eye doctor.      GERD   Stable on omeprazole .  Continue same.       Relevant Medications   omeprazole  (PRILOSEC) 40 MG capsule   Fibromyalgia muscle pain   Intermittent symptoms- managing.        Relevant Medications   meloxicam  (MOBIC ) 15 MG tablet   DDD (degenerative disc disease), lumbar   Reports back pain is stable- exercising.        Controlled type 2 diabetes mellitus with microalbuminuria, without long-term current use of insulin  (HCC) - Primary   Lab Results  Component Value Date   HGBA1C 5.9 04/18/2023   Clinically stable but having increased appetite. Will increase from 7.5mg  tp 10mg  of moujaro to assist with weight loss.      Relevant Medications   tirzepatide  (MOUNJARO ) 10 MG/0.5ML Pen   losartan  (COZAAR ) 25 MG tablet   Other Relevant Orders   Comp Met (CMET) (Completed)  HgB A1c (Completed)   Urine Microalbumin w/creat. ratio (Completed)   Constipation   Continues miralax , prunalax, senakot.        Class 1 obesity without serious comorbidity with body mass index (BMI) of 32.0 to 32.9 in adult, unspecified obesity type   Increase mounjaro  as above.      Relevant Medications   tirzepatide  (MOUNJARO ) 10 MG/0.5ML Pen   Anxiety and depression   Reports stable mood.       Other Visit Diagnoses       Hair loss       Relevant Orders   TSH (Completed)   T4, free (Completed)     Anemia, unspecified type       Relevant Orders   CBC w/Diff (Completed)   B12 (Completed)   IBC + Ferritin (Completed)   Fecal occult blood, imunochemical       I have discontinued Omara F. Haydu's Plant Sterol Stanol-Pantethine (CHOLEST OFF COMPLETE PO) and Mounjaro . I have also changed her omeprazole  and losartan . Additionally, I am having her start on tirzepatide . Lastly, I am having her maintain her fexofenadine, cholecalciferol , vitamin C , multivitamin with minerals, timolol , senna, betamethasone  valerate ointment, Calcium  Carbonate (CALTRATE 600 PO), Probiotic Product (PROBIOTIC DAILY PO), aspirin  EC, docusate sodium , polyethylene glycol, atorvastatin , meloxicam , and hydrochlorothiazide .  Meds ordered this encounter  Medications   tirzepatide  (MOUNJARO ) 10 MG/0.5ML Pen    Sig: Inject 10 mg into the skin once a week.    Dispense:  6 mL    Refill:  0    Supervising Provider:   DOMENICA BLACKBIRD A [[id number]]    hydrochlorothiazide  (HYDRODIURIL ) 25 MG tablet    Sig: Take 1 tablet (25 mg total) by mouth daily.    Dispense:  90 tablet    Refill:  1    Supervising Provider:   DOMENICA BLACKBIRD A [[id number]]   omeprazole  (PRILOSEC) 40 MG capsule    Sig: Take 1 capsule (40 mg total) by mouth daily.    Dispense:  90 capsule    Refill:  1    Supervising Provider:   DOMENICA BLACKBIRD A [[id number]]   losartan  (COZAAR ) 25 MG tablet    Sig: Take 1 tablet (25 mg total) by mouth daily.    Dispense:  90 tablet    Refill:  1    Supervising Provider:   DOMENICA BLACKBIRD A [[id number]]

## 2023-12-12 NOTE — Assessment & Plan Note (Signed)
 Increase mounjaro  as above.

## 2023-12-12 NOTE — Patient Instructions (Signed)
 VISIT SUMMARY:  Today, we reviewed your type 2 diabetes management, addressed your fibromyalgia pain, and discussed your chronic constipation. We also evaluated your anemia, hypertension, hyperlipidemia, GERD, and glaucoma management.  YOUR PLAN:  TYPE 2 DIABETES MELLITUS: Your diabetes is well-controlled with an A1c of 5.9%, but the effects of Mounjaro  have plateaued. -Increase Mounjaro  to 10 mg. Be aware of potential side effects like nausea and constipation.  ANEMIA: You have anemia, and we need to determine the cause. -Order complete blood count, iron level, and B12 level. -Complete stool kit for occult blood.  HYPERTENSION: Your blood pressure is well-controlled with your current medication. -Refill hydrochlorothiazide  as needed.  HYPERLIPIDEMIA: Your cholesterol levels are being managed with atorvastatin , but we need to update your cholesterol levels. -Update cholesterol levels. If cholesterol is not at goal, consider increasing atorvastatin  to 40 mg.  GASTROESOPHAGEAL REFLUX DISEASE (GERD): Your GERD is well-managed with omeprazole . -Continue current omeprazole  regimen.  FIBROMYALGIA: You have fibromyalgia pain, but it is manageable without medication. -Continue current management without medication.  CHRONIC CONSTIPATION: You have chronic constipation, and your current regimen is more effective than Linzess . -Continue taking Miralax , Senokot, and Prunolax.  GLAUCOMA: Your glaucoma is managed with timolol  eye drops. -Continue using timolol  eye drops.  GENERAL HEALTH MAINTENANCE: Your vitamin D  levels are adequate, but you are due for a tetanus vaccination. -Obtain tetanus vaccination at the pharmacy and inform us  once completed.

## 2023-12-12 NOTE — Assessment & Plan Note (Signed)
 Continues miralax , prunalax, senakot.

## 2023-12-12 NOTE — Assessment & Plan Note (Signed)
 BP Readings from Last 3 Encounters:  12/12/23 110/60  08/05/23 (!) 101/50  04/18/23 (!) 111/51   Stable on hydrochlorothiazide  and losartan -continue same.

## 2023-12-18 NOTE — Progress Notes (Signed)
 This encounter was created in error - please disregard.

## 2023-12-23 ENCOUNTER — Other Ambulatory Visit (INDEPENDENT_AMBULATORY_CARE_PROVIDER_SITE_OTHER)

## 2023-12-23 ENCOUNTER — Encounter (HOSPITAL_BASED_OUTPATIENT_CLINIC_OR_DEPARTMENT_OTHER): Payer: Self-pay

## 2023-12-23 ENCOUNTER — Ambulatory Visit (HOSPITAL_BASED_OUTPATIENT_CLINIC_OR_DEPARTMENT_OTHER)
Admission: RE | Admit: 2023-12-23 | Discharge: 2023-12-23 | Disposition: A | Discharge status: Home / Self Care | Source: Ambulatory Visit | Attending: Family | Admitting: Family

## 2023-12-23 DIAGNOSIS — D649 Anemia, unspecified: Secondary | ICD-10-CM

## 2023-12-23 DIAGNOSIS — Z1231 Encounter for screening mammogram for malignant neoplasm of breast: Secondary | ICD-10-CM | POA: Insufficient documentation

## 2023-12-23 DIAGNOSIS — Z1239 Encounter for other screening for malignant neoplasm of breast: Secondary | ICD-10-CM

## 2023-12-25 ENCOUNTER — Encounter: Payer: Self-pay | Admitting: Family

## 2023-12-25 LAB — FECAL OCCULT BLOOD, IMMUNOCHEMICAL: Fecal Occult Bld: NEGATIVE

## 2023-12-26 ENCOUNTER — Other Ambulatory Visit: Payer: Self-pay | Admitting: Family

## 2023-12-26 ENCOUNTER — Ambulatory Visit: Payer: Self-pay

## 2023-12-26 DIAGNOSIS — R928 Other abnormal and inconclusive findings on diagnostic imaging of breast: Secondary | ICD-10-CM

## 2023-12-29 ENCOUNTER — Other Ambulatory Visit (HOSPITAL_BASED_OUTPATIENT_CLINIC_OR_DEPARTMENT_OTHER): Payer: Self-pay

## 2023-12-29 ENCOUNTER — Ambulatory Visit (INDEPENDENT_AMBULATORY_CARE_PROVIDER_SITE_OTHER): Admitting: Family Medicine

## 2023-12-29 ENCOUNTER — Encounter: Payer: Self-pay | Admitting: Family Medicine

## 2023-12-29 VITALS — BP 120/72 | HR 70 | Temp 97.8°F | Ht 68.0 in | Wt 214.0 lb

## 2023-12-29 DIAGNOSIS — H9201 Otalgia, right ear: Secondary | ICD-10-CM | POA: Diagnosis not present

## 2023-12-29 DIAGNOSIS — J3489 Other specified disorders of nose and nasal sinuses: Secondary | ICD-10-CM

## 2023-12-29 MED ORDER — AMOXICILLIN-POT CLAVULANATE 875-125 MG PO TABS
1.0000 | ORAL_TABLET | Freq: Two times a day (BID) | ORAL | 0 refills | Status: AC
Start: 2023-12-29 — End: 2024-01-05
  Filled 2023-12-29: qty 14, 7d supply, fill #0

## 2023-12-29 NOTE — Telephone Encounter (Signed)
 She should be seen today if someone has an opening please.

## 2023-12-29 NOTE — Patient Instructions (Signed)
 After the antibiotics take care of the infection, you can try DEBROX drops (ear wax removal drops, over-the-counter) to soften the wax. After a few days to soften the wax, you can come back for ear irrigation in the office - just want to make sure the pain/infection is better first.

## 2023-12-29 NOTE — Telephone Encounter (Signed)
 Patient scheduled to see Waddell Mon today

## 2023-12-29 NOTE — Progress Notes (Signed)
 Acute Office Visit  Subjective:     Patient ID: [name], female    DOB: Oct 28, 1955, 68 y.o.   MRN: [id number]  Chief Complaint  Patient presents with   Ear Problem    HPI Patient is in today for ear pain.  Discussed the use of AI scribe software for clinical note transcription with the patient, who gave verbal consent to proceed.  History of Present Illness [name] is a 68 year old female who presents with right ear pain and sinus symptoms.  She has been experiencing severe right ear pain for over a week, rating it an 8 out of 10. The pain radiates to her face and is associated with some perceived swelling around her eyes and a headache that initially started in one area and then localized to the ear. Warm compresses provide temporary relief.  She has had a runny nose and felt feverish at one point, although she has not measured her temperature. No recent exposure to sick individuals and no coughing or sneezing.   She regularly sees her dentist and had a check-up in May with no new findings.  She also notes sinus pain and fullness on the affected side.      ROS All review of systems negative except what is listed in the HPI      Objective:    BP 120/72   Pulse 70   Temp 97.8 F (36.6 C) (Oral)   Ht 5' 8 (1.727 m)   Wt 214 lb (97.1 kg)   SpO2 100%   BMI 32.54 kg/m    Physical Exam Vitals reviewed.  Constitutional:      General: She is not in acute distress.    Appearance: Normal appearance.  HENT:     Head: Normocephalic and atraumatic.     Right Ear: There is impacted cerumen.     Left Ear: Tympanic membrane normal.     Nose:     Right Sinus: Maxillary sinus tenderness and frontal sinus tenderness present.     Left Sinus: No maxillary sinus tenderness or frontal sinus tenderness.     Mouth/Throat:     Mouth: Mucous membranes are dry.     Dentition: Dental caries present. No gingival swelling, dental abscesses or gum lesions.   Cardiovascular:     Rate and Rhythm: Normal rate and regular rhythm.  Pulmonary:     Effort: Pulmonary effort is normal.     Breath sounds: Normal breath sounds.  Musculoskeletal:     Cervical back: Normal range of motion and neck supple.  Lymphadenopathy:     Cervical: No cervical adenopathy.  Skin:    General: Skin is warm and dry.  Neurological:     Mental Status: She is alert and oriented to person, place, and time.  Psychiatric:        Mood and Affect: Mood normal.        Behavior: Behavior normal.        Thought Content: Thought content normal.        Judgment: Judgment normal.          No results found for any visits on 12/29/23.      Assessment & Plan:   Problem List Items Addressed This Visit   None Visit Diagnoses       Right ear pain    -  Primary   Relevant Medications   amoxicillin -clavulanate (AUGMENTIN ) 875-125 MG tablet     Sinus pain  Relevant Medications   amoxicillin -clavulanate (AUGMENTIN ) 875-125 MG tablet      Assessment & Plan Ear and sinus infection Severe right-sided otalgia with sinus radiation, headache, and rhinorrhea. Differential includes otitis media or sinusitis. Initiated Augmentin  due to its efficacy for both conditions. No antibiotic allergies or recent use except dental prophylaxis. - Prescribe Augmentin  for one week. - Advise warm compresses for analgesia. - Encourage hydration and probiotic use during antibiotic therapy, ensuring probiotics are taken at least three hours apart from antibiotics.  After the antibiotics take care of the infection, you can try DEBROX drops (ear wax removal drops, over-the-counter) to soften the wax. After a few days to soften the wax, you can come back for ear irrigation in the office - just want to make sure the pain/infection is better first.    Meds ordered this encounter  Medications   amoxicillin -clavulanate (AUGMENTIN ) 875-125 MG tablet    Sig: Take 1 tablet by mouth 2 (two)  times daily for 7 days.    Dispense:  14 tablet    Refill:  0    Supervising Provider:   DOMENICA BLACKBIRD A [[id number]]    Return if symptoms worsen or fail to improve.  Waddell KATHEE Mon, NP

## 2023-12-30 ENCOUNTER — Encounter: Payer: Self-pay | Admitting: Pharmacist

## 2023-12-30 NOTE — Progress Notes (Unsigned)
 Pharmacy Quality Measure Review  This patient is appearing on a report for being at risk of failing the adherence measure for hypertension (ACEi/ARB) medications this calendar year.   Medication: losartan  25mg  Last fill date: 10/31/2023 for 90 day supply per adherence report  Reviewed recent refill history in Dr Annemarie database. Actual last refill date was 12/12/2023 for 90 day supply. Patient has 1 refills remaining. Next appointment with PCP is not currently scheduled.    Insurance report was not up to date. No action needed at this time. Will continue to follow adherence in 2025.   Madelin Ray, PharmD Clinical Pharmacist [facility]  Population Health [phone]

## 2024-01-01 ENCOUNTER — Ambulatory Visit
Admission: RE | Admit: 2024-01-01 | Discharge: 2024-01-01 | Disposition: A | Discharge status: Home / Self Care | Source: Ambulatory Visit | Attending: Family | Admitting: Family

## 2024-01-01 ENCOUNTER — Ambulatory Visit: Admission: RE | Admit: 2024-01-01 | Source: Ambulatory Visit

## 2024-01-01 DIAGNOSIS — R928 Other abnormal and inconclusive findings on diagnostic imaging of breast: Secondary | ICD-10-CM | POA: Diagnosis not present

## 2024-01-01 DIAGNOSIS — R92322 Mammographic fibroglandular density, left breast: Secondary | ICD-10-CM | POA: Diagnosis not present

## 2024-02-07 ENCOUNTER — Other Ambulatory Visit: Payer: Self-pay | Admitting: Family

## 2024-03-11 ENCOUNTER — Other Ambulatory Visit: Payer: Self-pay | Admitting: Family

## 2024-03-11 ENCOUNTER — Encounter: Payer: Self-pay | Admitting: Family

## 2024-03-11 DIAGNOSIS — E1129 Type 2 diabetes mellitus with other diabetic kidney complication: Secondary | ICD-10-CM

## 2024-03-11 DIAGNOSIS — E785 Hyperlipidemia, unspecified: Secondary | ICD-10-CM

## 2024-03-11 MED ORDER — REPATHA SURECLICK 140 MG/ML ~~LOC~~ SOAJ: 140.0000 mg | SUBCUTANEOUS | 2 refills | Status: DC

## 2024-03-11 NOTE — Telephone Encounter (Signed)
 Please contact pt to schedule a follow up visit in 1 month.

## 2024-03-15 ENCOUNTER — Telehealth: Payer: Self-pay

## 2024-03-15 ENCOUNTER — Other Ambulatory Visit (HOSPITAL_COMMUNITY): Payer: Self-pay

## 2024-03-15 DIAGNOSIS — E786 Lipoprotein deficiency: Secondary | ICD-10-CM

## 2024-03-15 NOTE — Telephone Encounter (Signed)
 Pharmacy Patient Advocate Encounter   Received notification from Patient Advice Request messages that prior authorization for Repatha SureClick 140mg /ml is required/requested.   Insurance verification completed.   The patient is insured through [facility].   Per test claim: PA required; PA submitted to above mentioned insurance via Latent Key/confirmation #/EOC [facility] Status is pending

## 2024-03-16 MED ORDER — NEXLIZET 180-10 MG PO TABS: 1.0000 | ORAL_TABLET | Freq: Every day | ORAL | 2 refills | Status: DC

## 2024-03-16 NOTE — Addendum Note (Signed)
 Addended by: DARYL SETTER on: 03/16/2024 06:39 PM   Modules accepted: Orders

## 2024-03-16 NOTE — Telephone Encounter (Signed)
 Pharmacy Patient Advocate Encounter  Received notification from [facility] that Prior Authorization for Repatha SureClick 140mg /ml has been DENIED.  Full denial letter will be uploaded to the media tab. See denial reason below.   PA #/Case ID/Reference #: [id number]

## 2024-03-17 DIAGNOSIS — H02051 Trichiasis without entropian right upper eyelid: Secondary | ICD-10-CM | POA: Diagnosis not present

## 2024-03-17 DIAGNOSIS — H02052 Trichiasis without entropian right lower eyelid: Secondary | ICD-10-CM | POA: Diagnosis not present

## 2024-03-17 DIAGNOSIS — H0288A Meibomian gland dysfunction right eye, upper and lower eyelids: Secondary | ICD-10-CM | POA: Diagnosis not present

## 2024-03-17 DIAGNOSIS — H0288B Meibomian gland dysfunction left eye, upper and lower eyelids: Secondary | ICD-10-CM | POA: Diagnosis not present

## 2024-03-17 DIAGNOSIS — H04123 Dry eye syndrome of bilateral lacrimal glands: Secondary | ICD-10-CM | POA: Diagnosis not present

## 2024-03-18 DIAGNOSIS — Z96651 Presence of right artificial knee joint: Secondary | ICD-10-CM | POA: Diagnosis not present

## 2024-03-19 ENCOUNTER — Other Ambulatory Visit (HOSPITAL_COMMUNITY): Payer: Self-pay | Admitting: Specialist

## 2024-03-19 DIAGNOSIS — Z96651 Presence of right artificial knee joint: Secondary | ICD-10-CM

## 2024-03-24 NOTE — Progress Notes (Signed)
 Pharmacy Quality Measure Review  This patient is appearing on a report for being at risk of failing the adherence measure for hypertension (ACEi/ARB) medications this calendar year.   Medication: losartan  25 mg Last fill date: 03/06/24 for 90 day supply  Insurance report was not up to date. No action needed at this time.    Jenkins Graces, PharmD PGY1 Pharmacy Resident [phone]

## 2024-03-26 ENCOUNTER — Other Ambulatory Visit (HOSPITAL_COMMUNITY)

## 2024-03-26 ENCOUNTER — Encounter (HOSPITAL_COMMUNITY)

## 2024-04-13 ENCOUNTER — Other Ambulatory Visit (HOSPITAL_COMMUNITY)
Admission: RE | Admit: 2024-04-13 | Discharge: 2024-04-13 | Disposition: A | Discharge status: Home / Self Care | Source: Ambulatory Visit | Attending: Family | Admitting: Family

## 2024-04-13 ENCOUNTER — Encounter: Payer: Self-pay | Admitting: Family

## 2024-04-13 ENCOUNTER — Ambulatory Visit: Payer: Self-pay | Admitting: Family

## 2024-04-13 ENCOUNTER — Ambulatory Visit: Admitting: Family

## 2024-04-13 VITALS — BP 104/66 | HR 71 | Temp 97.8°F | Resp 16 | Ht 67.0 in | Wt 214.2 lb

## 2024-04-13 DIAGNOSIS — F419 Anxiety disorder, unspecified: Secondary | ICD-10-CM | POA: Diagnosis not present

## 2024-04-13 DIAGNOSIS — E785 Hyperlipidemia, unspecified: Secondary | ICD-10-CM

## 2024-04-13 DIAGNOSIS — Z7985 Long-term (current) use of injectable non-insulin antidiabetic drugs: Secondary | ICD-10-CM

## 2024-04-13 DIAGNOSIS — I10 Essential (primary) hypertension: Secondary | ICD-10-CM | POA: Diagnosis not present

## 2024-04-13 DIAGNOSIS — K581 Irritable bowel syndrome with constipation: Secondary | ICD-10-CM | POA: Diagnosis not present

## 2024-04-13 DIAGNOSIS — N760 Acute vaginitis: Secondary | ICD-10-CM | POA: Diagnosis present

## 2024-04-13 DIAGNOSIS — R809 Proteinuria, unspecified: Secondary | ICD-10-CM | POA: Diagnosis not present

## 2024-04-13 DIAGNOSIS — F32A Depression, unspecified: Secondary | ICD-10-CM | POA: Diagnosis not present

## 2024-04-13 DIAGNOSIS — R0600 Dyspnea, unspecified: Secondary | ICD-10-CM | POA: Insufficient documentation

## 2024-04-13 DIAGNOSIS — R300 Dysuria: Secondary | ICD-10-CM | POA: Diagnosis not present

## 2024-04-13 DIAGNOSIS — E1129 Type 2 diabetes mellitus with other diabetic kidney complication: Secondary | ICD-10-CM | POA: Diagnosis not present

## 2024-04-13 DIAGNOSIS — Z23 Encounter for immunization: Secondary | ICD-10-CM | POA: Diagnosis not present

## 2024-04-13 LAB — POCT URINALYSIS DIPSTICK
Bilirubin, UA: NEGATIVE
Blood, UA: NEGATIVE
Glucose, UA: NEGATIVE
Nitrite, UA: NEGATIVE
Protein, UA: POSITIVE — AB
Spec Grav, UA: 1.02 (ref 1.010–1.025)
Urobilinogen, UA: 0.2 U/dL
pH, UA: 6 (ref 5.0–8.0)

## 2024-04-13 LAB — BASIC METABOLIC PANEL WITH GFR
BUN: 12 mg/dL (ref 6–23)
CO2: 32 meq/L (ref 19–32)
Calcium: 9.5 mg/dL (ref 8.4–10.5)
Chloride: 99 meq/L (ref 96–112)
Creatinine, Ser: 0.73 mg/dL (ref 0.40–1.20)
GFR: 83.94 mL/min (ref >60.00)
Glucose, Bld: 74 mg/dL (ref 70–99)
Potassium: 3.8 meq/L (ref 3.5–5.1)
Sodium: 139 meq/L (ref 135–145)

## 2024-04-13 LAB — HEMOGLOBIN A1C: Hgb A1c MFr Bld: 5.6 % (ref 4.6–6.5)

## 2024-04-13 NOTE — Progress Notes (Signed)
 Established Patient Office Visit  Patient ID: [name], female    DOB: Nov 11, 1955  Age: 68 y.o. MRN: [id number] PCP: Daryl Setter, NP  Chief Complaint  Patient presents with   Follow-up    Medication check - (last 2 recently prescribed)   Dizziness    Patient feels like she has been light-headed lately. On and off for a few months   Dysuria    Patient has been having some burning sensation when she urinates ; a couple of weeks    Subjective:     Dizziness  Dysuria     Discussed the use of AI scribe software for clinical note transcription with the patient, who gave verbal consent to proceed.  History of Present Illness [name] is a 68 year old female who presents for a follow-up visit.  She has been experiencing urinary symptoms for a couple of weeks, including occasional dysuria and a 'fishy smell.' She acknowledges the possibility that the odor could be vaginal in origin. She experiences occasional vaginal pruritus but no discharge. She has been taking antibiotics recently.  Her blood pressure is well-controlled with losartan  25 mg and hydrochlorothiazide  25 mg. She has not experienced any leg swelling prior to starting hydrochlorothiazide . She mentions occasional dizziness but has not had any issues with hydrochlorothiazide  in the past.  She is on Mounjaro  for diabetes management, with her last A1c being 6.0. Mounjaro  sometimes helps with her appetite, particularly in curbing sugar cravings, although she feels her weight is stable.  She experiences chronic constipation and takes Miralax  and ProLax daily, noting that Miralax  works better for her.  She has a history of high cholesterol and has been unable to obtain Repatha or Nexlizet due to insurance coverage issues. She reports episodes of dyspnea, including an incident three weeks ago where she felt she was losing her breath and experienced chest pressure. [place],  [place] ambulance assistance. She has a family history of heart disease, with her mother passing away from heart disease at the age of 47.  No mood, anxiety, or depression issues. She has received her flu shot but is hesitant about the COVID booster due to a previous adverse reaction.     Review of Systems  Genitourinary:  Positive for dysuria.  Neurological:  Positive for dizziness.    Wt Readings from Last 3 Encounters:  04/13/24 214 lb 3.2 oz (97.2 kg)  12/29/23 214 lb (97.1 kg)  12/12/23 212 lb (96.2 kg)      Objective:     BP 104/66 (BP Location: Right Arm, Patient Position: Sitting, Cuff Size: Large)   Pulse 71   Temp 97.8 F (36.6 C) (Oral)   Resp 16   Ht 5' 7 (1.702 m)   Wt 214 lb 3.2 oz (97.2 kg)   SpO2 99%   BMI 33.55 kg/m    Physical Exam Exam conducted with a chaperone present.  Constitutional:      General: She is not in acute distress.    Appearance: Normal appearance. She is well-developed.  HENT:     Head: Normocephalic and atraumatic.     Right Ear: External ear normal.     Left Ear: External ear normal.  Eyes:     General: No scleral icterus. Neck:     Thyroid : No thyromegaly.  Cardiovascular:     Rate and Rhythm: Normal rate and regular rhythm.     Heart sounds: Normal heart sounds. No murmur heard.  Pulmonary:     Effort: Pulmonary effort is normal. No respiratory distress.     Breath sounds: Normal breath sounds. No wheezing.  Genitourinary:    General: Normal vulva.     Vagina: No vaginal discharge.  Musculoskeletal:     Cervical back: Neck supple.  Skin:    General: Skin is warm and dry.  Neurological:     Mental Status: She is alert and oriented to person, place, and time.  Psychiatric:        Mood and Affect: Mood normal.        Behavior: Behavior normal.        Thought Content: Thought content normal.        Judgment: Judgment normal.      Results for orders placed or performed in visit on 04/13/24  POCT Urinalysis  Dipstick  Result Value Ref Range   Color, UA Dark Yellow    Clarity, UA Clear    Glucose, UA Negative Negative   Bilirubin, UA Negative    Ketones, UA Trace    Spec Grav, UA 1.020 1.010 - 1.025   Blood, UA Negative    pH, UA 6.0 5.0 - 8.0   Protein, UA Positive (A) Negative   Urobilinogen, UA 0.2 0.2 or 1.0 E.U./dL   Nitrite, UA Negative    Leukocytes, UA Trace (A) Negative   Appearance Clear    Odor Strong       The 10-year ASCVD risk score (Arnett DK, et al., 2019) is: 22.6%    Assessment & Plan:   Problem List Items Addressed This Visit       Unprioritized   IBS (irritable bowel syndrome)   Stays constipated- uses prune-lax and miralax .        HYPERTENSION, BENIGN ESSENTIAL - Primary   Overtreated.  Will d/c hydrochlorothiazide  and continue losartan  for renal protection/bp.      Hyperlipidemia with target LDL less than 100   Lab Results  Component Value Date   CHOL 277 (H) 08/05/2023   HDL 50.30 08/05/2023   LDLCALC 178 (H) 08/05/2023   LDLDIRECT 174.0 05/25/2019   TRIG 244.0 (H) 08/05/2023   CHOLHDL 6 08/05/2023         Dyspnea   Intermittent dyspnea and intermittent chest pressure with possible cardiac etiology due to family history and personal history of diabetes and hyperlipidemia. - Ordered cardiac CT for coronary artery calcification. - Ordered echocardiogram for heart function. - Referred to cardiology. - Performed EKG- EKG tracing is personally reviewed.  EKG notes NSR.  No acute changes.        Relevant Orders   CT CARDIAC SCORING (SELF PAY ONLY)   ECHOCARDIOGRAM COMPLETE   Ambulatory referral to Cardiology   EKG 12-Lead (Completed)   Controlled type 2 diabetes mellitus with microalbuminuria, without long-term current use of insulin  [facility]   Lab Results  Component Value Date   HGBA1C 6.0 12/12/2023   HGBA1C 5.9 04/18/2023   HGBA1C 6.3 12/25/2022   Lab Results  Component Value Date   MICROALBUR 1.8 12/12/2023   LDLCALC 178 (H)  08/05/2023   CREATININE 0.78 12/12/2023   Stable on mounjaro .  Plans to work on diet for weight loss.        Relevant Orders   HgB A1c   Basic Metabolic Panel (BMET)   Anxiety and depression   Stable without medication.       Other Visit Diagnoses       Dysuria  Relevant Orders   POCT Urinalysis Dipstick (Completed)   Urine Culture     Needs flu shot       Relevant Orders   Flu vaccine HIGH DOSE PF(Fluzone Trivalent) (Completed)     Acute vaginitis       Relevant Orders   Cervicovaginal ancillary only( [place])

## 2024-04-13 NOTE — Assessment & Plan Note (Signed)
 Lab Results  Component Value Date   HGBA1C 6.0 12/12/2023   HGBA1C 5.9 04/18/2023   HGBA1C 6.3 12/25/2022   Lab Results  Component Value Date   MICROALBUR 1.8 12/12/2023   LDLCALC 178 (H) 08/05/2023   CREATININE 0.78 12/12/2023   Stable on mounjaro .  Plans to work on diet for weight loss.

## 2024-04-13 NOTE — Assessment & Plan Note (Signed)
 Lab Results  Component Value Date   CHOL 277 (H) 08/05/2023   HDL 50.30 08/05/2023   LDLCALC 178 (H) 08/05/2023   LDLDIRECT 174.0 05/25/2019   TRIG 244.0 (H) 08/05/2023   CHOLHDL 6 08/05/2023

## 2024-04-13 NOTE — Assessment & Plan Note (Signed)
 Intermittent dyspnea and intermittent chest pressure with possible cardiac etiology due to family history and personal history of diabetes and hyperlipidemia. - Ordered cardiac CT for coronary artery calcification. - Ordered echocardiogram for heart function. - Referred to cardiology. - Performed EKG- EKG tracing is personally reviewed.  EKG notes NSR.  No acute changes.

## 2024-04-13 NOTE — Assessment & Plan Note (Signed)
 Overtreated.  Will d/c hydrochlorothiazide  and continue losartan  for renal protection/bp.

## 2024-04-13 NOTE — Assessment & Plan Note (Signed)
 Stable without medication

## 2024-04-13 NOTE — Assessment & Plan Note (Signed)
 Stays constipated- uses prune-lax and miralax .

## 2024-04-13 NOTE — Patient Instructions (Signed)
  VISIT SUMMARY: [name], during today's follow-up visit, we addressed several of your ongoing health concerns, including urinary symptoms, dyspnea, diabetes management, hypertension, hyperlipidemia, constipation, and weight management. We also reviewed your general health maintenance, including vaccinations.  YOUR PLAN: -URINARY SYMPTOMS: You have been experiencing urinary symptoms such as occasional pain during urination and a fishy smell, which might be due to a urinary tract infection or bacterial vaginosis. We performed a urinalysis that showed signs of infection, and we have sent your urine for culture to confirm this. Additionally, we took a vaginal swab to check for bacterial vaginosis and yeast infection.  -DYSPNEA AND CHEST PRESSURE: You reported episodes of shortness of breath and chest pressure, which could be related to your heart, especially given your family history of heart disease. We have ordered a cardiac CT to check for coronary artery calcification and an echocardiogram to assess your heart function. We also performed an EKG and referred you to a cardiologist for further evaluation.  -TYPE 2 DIABETES MELLITUS: Your diabetes is well-controlled with your current medication, Tirzepatide  (Mounjaro ), and your last A1c was 6.0. Continue taking Tirzepatide  (Mounjaro ) 10 mg weekly as prescribed.  -ESSENTIAL HYPERTENSION: Your blood pressure is well-controlled, but we have discontinued Hydrochlorothiazide  due to your low blood pressure and lack of leg swelling. Please monitor your blood pressure at home and we will recheck it in one month.  -HYPERLIPIDEMIA: You have high cholesterol, and we discussed the possibility of using a coronary artery calcification score test to help with insurance coverage for Repatha. We have ordered this test for you.  -CONSTIPATION: You have chronic constipation, which is better managed with Miralax . Continue taking Miralax  as it has been effective for  you.  -OBESITY: Your weight is stable, and Tirzepatide  (Mounjaro ) helps control your appetite. We discussed increasing the dosage, but you prefer to make dietary changes. Continue with your current dosage and focus on reducing sugar intake.  -GENERAL HEALTH MAINTENANCE: You have received your flu shot. We discussed the COVID booster, but you have decided against it due to a previous adverse reaction.  INSTRUCTIONS: Please follow up with the cardiologist as referred. Monitor your blood pressure at home and return for a recheck in one month. We will contact you with the results of your urine culture and vaginal swab tests. Continue with your current medications and dietary changes as discussed.

## 2024-04-14 LAB — CERVICOVAGINAL ANCILLARY ONLY
Bacterial Vaginitis (gardnerella): NEGATIVE
Candida Glabrata: NEGATIVE
Candida Vaginitis: NEGATIVE
Comment: NEGATIVE
Comment: NEGATIVE
Comment: NEGATIVE

## 2024-04-14 LAB — URINE CULTURE
MICRO NUMBER:: 17219573
Result:: NO GROWTH
SPECIMEN QUALITY:: ADEQUATE

## 2024-05-10 ENCOUNTER — Ambulatory Visit (HOSPITAL_BASED_OUTPATIENT_CLINIC_OR_DEPARTMENT_OTHER)
Admission: RE | Admit: 2024-05-10 | Discharge: 2024-05-10 | Disposition: A | Payer: Self-pay | Source: Ambulatory Visit | Attending: Family | Admitting: Family

## 2024-05-10 ENCOUNTER — Ambulatory Visit (HOSPITAL_BASED_OUTPATIENT_CLINIC_OR_DEPARTMENT_OTHER)
Admission: RE | Admit: 2024-05-10 | Discharge: 2024-05-10 | Disposition: A | Discharge status: Home / Self Care | Source: Ambulatory Visit | Attending: *Deleted | Admitting: *Deleted

## 2024-05-10 DIAGNOSIS — R0609 Other forms of dyspnea: Secondary | ICD-10-CM | POA: Diagnosis not present

## 2024-05-10 DIAGNOSIS — R0600 Dyspnea, unspecified: Secondary | ICD-10-CM | POA: Diagnosis present

## 2024-05-10 LAB — ECHOCARDIOGRAM COMPLETE
AR max vel: 2.17 cm2
AV Area VTI: 2.3 cm2
AV Area mean vel: 2.19 cm2
AV Mean grad: 3 mmHg
AV Peak grad: 5.1 mmHg
AV Vena cont: 0.2 cm
Ao pk vel: 1.13 m/s
Area-P 1/2: 2.59 cm2
Calc EF: 60.1 %
MV M vel: 3.46 m/s
MV Peak grad: 47.9 mmHg
S' Lateral: 2.7 cm
Single Plane A2C EF: 60 %
Single Plane A4C EF: 62 %

## 2024-05-13 ENCOUNTER — Other Ambulatory Visit: Payer: Self-pay

## 2024-05-13 DIAGNOSIS — M2550 Pain in unspecified joint: Secondary | ICD-10-CM | POA: Insufficient documentation

## 2024-05-13 DIAGNOSIS — Z8489 Family history of other specified conditions: Secondary | ICD-10-CM | POA: Insufficient documentation

## 2024-05-13 DIAGNOSIS — G709 Myoneural disorder, unspecified: Secondary | ICD-10-CM | POA: Insufficient documentation

## 2024-05-13 DIAGNOSIS — K209 Esophagitis, unspecified without bleeding: Secondary | ICD-10-CM | POA: Insufficient documentation

## 2024-05-13 DIAGNOSIS — K760 Fatty (change of) liver, not elsewhere classified: Secondary | ICD-10-CM | POA: Insufficient documentation

## 2024-05-13 DIAGNOSIS — H269 Unspecified cataract: Secondary | ICD-10-CM | POA: Insufficient documentation

## 2024-05-13 DIAGNOSIS — J189 Pneumonia, unspecified organism: Secondary | ICD-10-CM | POA: Insufficient documentation

## 2024-05-13 DIAGNOSIS — M797 Fibromyalgia: Secondary | ICD-10-CM | POA: Insufficient documentation

## 2024-05-13 DIAGNOSIS — R011 Cardiac murmur, unspecified: Secondary | ICD-10-CM | POA: Insufficient documentation

## 2024-05-13 DIAGNOSIS — M549 Dorsalgia, unspecified: Secondary | ICD-10-CM | POA: Insufficient documentation

## 2024-05-13 DIAGNOSIS — K829 Disease of gallbladder, unspecified: Secondary | ICD-10-CM | POA: Insufficient documentation

## 2024-05-13 DIAGNOSIS — Z9889 Other specified postprocedural states: Secondary | ICD-10-CM | POA: Insufficient documentation

## 2024-05-13 DIAGNOSIS — I10 Essential (primary) hypertension: Secondary | ICD-10-CM | POA: Insufficient documentation

## 2024-05-13 DIAGNOSIS — D131 Benign neoplasm of stomach: Secondary | ICD-10-CM | POA: Insufficient documentation

## 2024-05-13 DIAGNOSIS — M1990 Unspecified osteoarthritis, unspecified site: Secondary | ICD-10-CM | POA: Insufficient documentation

## 2024-05-13 DIAGNOSIS — E785 Hyperlipidemia, unspecified: Secondary | ICD-10-CM | POA: Insufficient documentation

## 2024-05-13 DIAGNOSIS — T7840XA Allergy, unspecified, initial encounter: Secondary | ICD-10-CM | POA: Insufficient documentation

## 2024-05-18 ENCOUNTER — Ambulatory Visit

## 2024-05-18 VITALS — BP 102/70 | HR 74 | Ht 66.0 in | Wt 211.0 lb

## 2024-05-18 DIAGNOSIS — I10 Essential (primary) hypertension: Secondary | ICD-10-CM

## 2024-05-18 DIAGNOSIS — E782 Mixed hyperlipidemia: Secondary | ICD-10-CM

## 2024-05-18 DIAGNOSIS — R0600 Dyspnea, unspecified: Secondary | ICD-10-CM

## 2024-05-18 NOTE — Assessment & Plan Note (Signed)
 Well-controlled on current treatment with losartan  25 mg once daily. Continue the same.

## 2024-05-18 NOTE — Assessment & Plan Note (Signed)
 Symptoms as reviewed above seem more associated with choking and discomfort in the throat that occurs intermittently and not associated with dyspnea. She has good functional capacity and denies any cardiac symptoms.  At this time given her reassuring calcium  score study and echocardiogram and good baseline functional status without any exertional symptoms hold off on further cardiac workup.  Recommended she follow-up with PCP with regards to her choking symptoms occurring randomly and consider further evaluation with gastroenterology and/or ENT. She reports remote history of EGD 10 years ago for similar symptoms.

## 2024-05-18 NOTE — Assessment & Plan Note (Signed)
 Lipid panel from 08/05/2023 not at goal. I do not see any recent lipid panel repeat.  I would recommend close follow-up with PCP and titrate up lipid-lowering therapy with atorvastatin  as needed to target LDL below 70 mg/dL given her history of diabetes.  If not attaining target levels with higher dose of statins, may further consider Zetia. Given that she will be following up with us  only on an as-needed basis, I will defer this to PCP.

## 2024-05-18 NOTE — Progress Notes (Signed)
 Cardiology Consultation:    Date:  05/18/2024   ID:  [name], DOB 08-04-1955, MRN [id number]  PCP:  Daryl Setter, NP  Cardiologist:  Alean JONELLE Kobus, MD   Referring MD: Daryl Setter, NP   No chief complaint on file.    ASSESSMENT AND PLAN:   [name] 68 year old woman with history of hypertension, hyperlipidemia, irritable bowel syndrome, and diabetes. Denies any prior history of CAD, CHF, MI, CVA. Reports remote history of stress test over 10 years ago that was normal. Recently had echocardiogram and calcium  score done. Echocardiogram from 05/10/2024 shows LVEF 60 to 65%, grade 1 diastolic dysfunction, normal RV size and function, trace MR, trace aortic insufficiency. Calcium  score study from 05/10/2024 shows calcium  score of 0, mild aortic valve calcification observed, aortic atherosclerosis observed.  Here for evaluation of her dyspnea however her symptoms are mostly described as a sensation of choking and shortness of breath that occur randomly lasting for about a minute not associated with any chest pain, palpitations, shortness of breath.  From cardiac standpoint she does have risk factors in the form of diabetes, hypertension and hyperlipidemia however her workup with calcium  score study and echocardiogram are reassuring.  Problem List Items Addressed This Visit       Cardiovascular and Mediastinum   HYPERTENSION, BENIGN ESSENTIAL   Well-controlled on current treatment with losartan  25 mg once daily. Continue the same.      Relevant Medications   aspirin  EC 81 MG tablet   atorvastatin  (LIPITOR) 20 MG tablet     Other   Dyspnea - Primary   Symptoms as reviewed above seem more associated with choking and discomfort in the throat that occurs intermittently and not associated with dyspnea. She has good functional capacity and denies any cardiac symptoms.  At this time given her reassuring calcium  score study and echocardiogram and good  baseline functional status without any exertional symptoms hold off on further cardiac workup.  Recommended she follow-up with PCP with regards to her choking symptoms occurring randomly and consider further evaluation with gastroenterology and/or ENT. She reports remote history of EGD 10 years ago for similar symptoms.        Relevant Orders   EKG 12-Lead (Completed)   Hyperlipidemia   Lipid panel from 08/05/2023 not at goal. I do not see any recent lipid panel repeat.  I would recommend close follow-up with PCP and titrate up lipid-lowering therapy with atorvastatin  as needed to target LDL below 70 mg/dL given her history of diabetes.  If not attaining target levels with higher dose of statins, may further consider Zetia. Given that she will be following up with us  only on an as-needed basis, I will defer this to PCP.       Relevant Medications   aspirin  EC 81 MG tablet   atorvastatin  (LIPITOR) 20 MG tablet   Return to clinic for follow-up with us  on an as-needed basis.   History of Present Illness:    [name] is a 68 y.o. female who is being seen today for the evaluation of dyspnea at the request of Daryl Setter, NP.   Pleasant woman here for the visit by herself.  Retired about after working for [employer] for 40 yrs. keeps herself busy at home with day-to-day activities and exercises by walking and sometimes using an elliptical and swimming.  Has history of hypertension, hyperlipidemia, irritable bowel syndrome, and diabetes. Denies any prior history of CAD, CHF, MI, CVA. Reports remote history of stress  test over 10 years ago that was normal. Recently had echocardiogram and calcium  score done. Echocardiogram from 05/10/2024 shows LVEF 60 to 65%, grade 1 diastolic dysfunction, normal RV size and function, trace MR, trace aortic insufficiency. Calcium  score study from 05/10/2024 shows calcium  score of 0, mild aortic valve calcification observed, aortic atherosclerosis  observed.  Reports remote history of symptoms that she describes as choking in the throat that happens occasionally and random and she has noticed a pattern of this happening more frequently when she visits a specific hotel in [facility].  Also has episodes occurring randomly at home.  With the episode she does have difficulty swallowing food and also describes it as a sense of trouble breathing and symptoms relieved quickly within a minute.  She had called EMS couple months ago for similar symptoms and resolved and hence did not go to the ER.  Blood pressures are typically well-controlled and good compliance with her medications.  Denies any blood in urine or stools. Denies any claudication symptoms. Denies any palpitations, lightheadedness, dizziness or syncopal episodes.  Briefly smoked for few years in her teen years. Does not drink alcohol.  EKG in the clinic today shows sinus rhythm heart rate 74/min, PR interval 154 ms, QRS duration 78 ms, QTc 426 ms low voltage QRS in precordial leads.  Last lipid panel reviewed from 08/05/2023 elevated total cholesterol 277, triglycerides 244, LDL 178 and HDL 50. Thyroid  panel from 12/12/2023 was normal 0.42.  Past Medical History:  Diagnosis Date   Allergy    SEASONAL   Anxiety and depression 11/05/2021   Arthritis    Back pain    Balding 10/13/2018   Benign fundic gland polyps of stomach    Cataract    EARL STAGES   Class 1 obesity without serious comorbidity with body mass index (BMI) of 32.0 to 32.9 in adult, unspecified obesity type 01/28/2013   Constipation    Controlled type 2 diabetes mellitus with microalbuminuria, without long-term current use of insulin  (HCC) 05/25/2008   DDD (degenerative disc disease), lumbar 10/13/2018   Dermatochalasis of both upper eyelids 05/04/2019   Dyspnea 04/13/2024   Esophagitis    LA Class B   Family history of adverse reaction to anesthesia    Maternal aunt died on the table at age 49 while having  a hysterectomy. only had hx of DM.   Fatty liver    Fibromyalgia    Fibromyalgia muscle pain 10/25/2008   Gallbladder problem    GERD 05/25/2008            Glaucoma    Heart murmur    Hematoma 04/18/2023   History of arthroscopy of right knee    Hyperlipidemia    Hyperlipidemia with target LDL less than 100 05/25/2008   The 10-year ASCVD risk score Verdon DC Jr., et al., 2013) is: 20.5%    Values used to calculate the score:      Age: 54 years      Sex: Female      Is Non-Hispanic African American: Yes      Diabetic: Yes      Tobacco smoker: No      Systolic Blood Pressure: 124 mmHg      Is BP treated: Yes      HDL Cholesterol: 49 mg/dL      Total Cholesterol: 236 mg/dL        Hypertension    HYPERTENSION, BENIGN ESSENTIAL 08/03/2009   IBS (irritable bowel syndrome)    Insomnia  secondary to chronic pain 09/09/2011   Joint pain    NASH (nonalcoholic steatohepatitis) 10/25/2008   Neuromuscular disorder (HCC)    Pneumonia    Preventative health care 05/15/2021   Stress incontinence 08/23/2020   Vitamin D  deficiency disease 10/13/2018    Past Surgical History:  Procedure Laterality Date   CHOLECYSTECTOMY  2009   low GB EF, no gallstones   COLONOSCOPY     EYE SURGERY Bilateral    cataract surgery w/ IOL   knee arthroscopy right  04/22/2022   LAPAROSCOPY     of adhesions/ of ovaries   TOTAL KNEE ARTHROPLASTY Right 03/21/2023   Procedure: TOTAL KNEE ARTHROPLASTY;  Surgeon: Duwayne Purchase, MD;  Location: [facility];  Service: Orthopedics;  Laterality: Right;  150 min   UPPER GASTROINTESTINAL ENDOSCOPY     VAGINAL HYSTERECTOMY      Current Medications: Active Medications[1]   Allergies:   Metformin  and related, Rosuvastatin , and Lisinopril    Social History   Socioeconomic History   Marital status: Single    Spouse name: Not on file   Number of children: Not on file   Years of education: Not on file   Highest education level: Bachelor's degree (e.g., BA, AB, BS)  Occupational  History   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 [employer]  Tobacco Use   Smoking status: Former    Current packs/day: 0.00    Average packs/day: 0.1 packs/day for 10.0 years (1.0 ttl pk-yrs)    Types: Cigarettes    Start date: 07/15/1971    Quit date: 07/14/1981    Years since quitting: 42.8    Passive exposure: Never   Smokeless tobacco: Never  Vaping Use   Vaping status: Never Used  Substance and Sexual Activity   Alcohol use: No   Drug use: No   Sexual activity: Not Currently  Other Topics Concern   Not on file  Social History Narrative   Widowed- husband passed last year   2 grown sons, grandchildren   Works for a field seismologist, Ship broker- retired   No pets   Enjoys writing, reading, being a grandmother, walking   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   Completed college   Social Drivers of Health   Tobacco Use: Medium Risk (05/18/2024)   Patient History    Smoking Tobacco Use: Former    Smokeless Tobacco Use: Never    Passive Exposure: Never  Physicist, Medical Strain: Low Risk (04/12/2024)   Overall Financial Resource Strain (CARDIA)    Difficulty of Paying Living Expenses: Not very hard  Food Insecurity: Food Insecurity Present (04/12/2024)   Epic    Worried About Programme Researcher, Broadcasting/film/video in the Last Year: Sometimes true    Ran Out of Food in the Last Year: Never true  Transportation Needs: Patient Declined (04/12/2024)   Epic    Lack of Transportation (Medical): Patient declined    Lack of Transportation (Non-Medical): Patient declined  Physical Activity: Sufficiently Active (04/12/2024)   Exercise Vital Sign    Days of Exercise per Week: 7 days    Minutes of Exercise per Session: 60 min  Stress: No Stress Concern Present (04/12/2024)   [employer] of Occupational Health - Occupational Stress Questionnaire    Feeling of Stress: Only a little  Social Connections: Moderately Integrated (04/12/2024)   Social Connection and  Isolation Panel    Frequency of Communication with Friends and Family: More than three times a week    Frequency  of Social Gatherings with Friends and Family: Once a week    Attends Religious Services: More than 4 times per year    Active Member of [employer] or Organizations: Yes    Attends Banker Meetings: More than 4 times per year    Marital Status: Widowed  Depression (PHQ2-9): Low Risk (04/13/2024)   Depression (PHQ2-9)    PHQ-2 Score: 0  Alcohol Screen: Low Risk (11/27/2023)   Alcohol Screen    Last Alcohol Screening Score (AUDIT): 0  Housing: Patient Declined (04/12/2024)   Epic    Unable to Pay for Housing in the Last Year: Patient declined    Number of Times Moved in the Last Year: Not on file    Homeless in the Last Year: Patient declined  Utilities: Not At Risk (11/27/2023)   Epic    Threatened with loss of utilities: No  Health Literacy: Adequate Health Literacy (11/27/2023)   B1300 Health Literacy    Frequency of need for help with medical instructions: Never     Family History: The patient's family history includes Breast cancer in her paternal grandmother; Colon cancer in her father; Colon polyps in her sister; Diabetes in her brother and sister; Heart disease in her brother and mother; Hyperlipidemia in her father and mother; Hypertension in her brother, father, and mother; Obesity in her father and mother; Sickle cell anemia in her maternal grandmother; Sickle cell trait in her son. There is no history of Esophageal cancer, Stomach cancer, Rectal cancer, Crohn's disease, or Ulcerative colitis. ROS:   Please see the history of present illness.    All 14 point review of systems negative except as described per history of present illness.  EKGs/Labs/Other Studies Reviewed:    The following studies were reviewed today:   EKG:  EKG Interpretation Date/Time:  Tuesday May 18 2024 09:42:05 EST Ventricular Rate:  74 PR Interval:  154 QRS Duration:  78 QT  Interval:  384 QTC Calculation: 426 R Axis:   -28  Text Interpretation: Normal sinus rhythm Possible Anterior infarct , age undetermined When compared with ECG of 12-Jun-2007 17:14, QRS axis Shifted left Borderline criteria for Anterior infarct are now Present Nonspecific T wave abnormality now evident in Inferior leads Confirmed by Liborio Hai reddy [phone]) on 05/18/2024 10:20:40 AM    Recent Labs: 12/12/2023: ALT 15; Hemoglobin 13.6; Platelets 222.0; TSH 0.42 04/13/2024: BUN 12; Creatinine, Ser 0.73; Potassium 3.8; Sodium 139  Recent Lipid Panel    Component Value Date/Time   CHOL 277 (H) 08/05/2023 1112   CHOL 261 (H) 12/09/2016 1145   TRIG 244.0 (H) 08/05/2023 1112   TRIG 221 11/14/2010 0000   TRIG 221 11/14/2010 0000   HDL 50.30 08/05/2023 1112   HDL 55 12/09/2016 1145   CHOLHDL 6 08/05/2023 1112   VLDL 48.8 (H) 08/05/2023 1112   LDLCALC 178 (H) 08/05/2023 1112   LDLCALC 224 (H) 06/09/2020 1119   LDLDIRECT 174.0 05/25/2019 1203    Physical Exam:    VS:  BP 102/70   Pulse 74   Ht 5' 6 (1.676 m)   Wt 211 lb (95.7 kg)   SpO2 99%   BMI 34.06 kg/m     Wt Readings from Last 3 Encounters:  05/18/24 211 lb (95.7 kg)  04/13/24 214 lb 3.2 oz (97.2 kg)  12/29/23 214 lb (97.1 kg)     GENERAL:  Well nourished, well developed in no acute distress NECK: No JVD; No carotid bruits CARDIAC: RRR, S1 and S2 present,  no murmurs, no rubs, no gallops CHEST:  Clear to auscultation without rales, wheezing or rhonchi  Extremities: No pitting pedal edema. Pulses bilaterally symmetric with radial 2+ NEUROLOGIC:  Alert and oriented x 3  Medication Adjustments/Labs and Tests Ordered: Current medicines are reviewed at length with the patient today.  Concerns regarding medicines are outlined above.  Orders Placed This Encounter  Procedures   EKG 12-Lead   No orders of the defined types were placed in this encounter.   Signed, Alean reddy Morrill Bomkamp, MD, MPH, [facility] 05/18/2024  10:46 AM    [facility]    [1]  Current Meds  Medication Sig   Ascorbic Acid  (VITAMIN C ) 100 MG tablet Take 100 mg by mouth daily.   aspirin  EC 81 MG tablet Take 81 mg by mouth daily. Swallow whole.   atorvastatin  (LIPITOR) 20 MG tablet Take 20 mg by mouth daily.   betamethasone  valerate ointment (VALISONE ) 0.1 % Apply 1 Application topically 2 (two) times daily. (Patient taking differently: Apply 1 Application topically 2 (two) times daily as needed (irritation).)   Calcium  Carbonate (CALTRATE 600 PO) Take 600 mg by mouth daily.   cholecalciferol  (VITAMIN D3) 25 MCG (1000 UNIT) tablet Take 1,000 Units by mouth daily.   cycloSPORINE (RESTASIS) 0.05 % ophthalmic emulsion Place 1 drop into both eyes 2 (two) times daily.   fexofenadine (ALLEGRA) 180 MG tablet Take 180 mg by mouth daily as needed for allergies.   losartan  (COZAAR ) 25 MG tablet Take 1 tablet (25 mg total) by mouth daily.   meloxicam  (MOBIC ) 15 MG tablet Take 15 mg by mouth as needed for pain.   MOUNJARO  10 MG/0.5ML Pen ADMINISTER 10 MG UNDER THE SKIN 1 TIME A WEEK   Multiple Vitamin (MULTIVITAMIN WITH MINERALS) TABS tablet Take 1 tablet by mouth daily.   omeprazole  (PRILOSEC) 40 MG capsule Take 1 capsule (40 mg total) by mouth daily.   polyethylene glycol (MIRALAX  / GLYCOLAX ) 17 g packet Take 17 g by mouth daily. (Patient taking differently: Take 17 g by mouth as needed for mild constipation or moderate constipation.)   Probiotic Product (PROBIOTIC DAILY PO) Take 1 capsule by mouth daily.   senna (SENOKOT) 8.6 MG tablet Take 5 tablets by mouth at bedtime.   timolol  (TIMOPTIC ) 0.5 % ophthalmic solution Place 1 drop into both eyes 2 (two) times daily.   [DISCONTINUED] hydrochlorothiazide  (HYDRODIURIL ) 25 MG tablet Take 1 tablet (25 mg total) by mouth daily.

## 2024-05-18 NOTE — Patient Instructions (Signed)
 Medication Instructions:  Your physician recommends that you continue on your current medications as directed. Please refer to the Current Medication list given to you today.  *If you need a refill on your cardiac medications before your next appointment, please call your pharmacy*   Lab Work: None ordered If you have labs (blood work) drawn today and your tests are completely normal, you will receive your results only by: MyChart Message (if you have MyChart) OR A paper copy in the mail If you have any lab test that is abnormal or we need to change your treatment, we will call you to review the results.   Testing/Procedures: None ordered   Follow-Up: At [facility], you and your health needs are our priority.  As part of our continuing mission to provide you with exceptional heart care, we have created designated Provider Care Teams.  These Care Teams include your primary Cardiologist (physician) and Advanced Practice Providers (APPs -  Physician Assistants and Nurse Practitioners) who all work together to provide you with the care you need, when you need it.  We recommend signing up for the patient portal called "MyChart".  Sign up information is provided on this After Visit Summary.  MyChart is used to connect with patients for Virtual Visits (Telemedicine).  Patients are able to view lab/test results, encounter notes, upcoming appointments, etc.  Non-urgent messages can be sent to your provider as well.   To learn more about what you can do with MyChart, go to [email or web address].    Your next appointment:   Follow up as needed  The format for your next appointment:   In Person  Provider:   Huntley Dec, MD    Other Instructions none  Important Information About Sugar

## 2024-06-07 DIAGNOSIS — E1129 Type 2 diabetes mellitus with other diabetic kidney complication: Secondary | ICD-10-CM

## 2024-06-07 NOTE — Telephone Encounter (Signed)
 Please schedule patient a follow up visit with me in February.

## 2024-06-07 NOTE — Telephone Encounter (Signed)
"  Got her scheduled  "

## 2024-06-10 ENCOUNTER — Other Ambulatory Visit: Payer: Self-pay | Admitting: Family

## 2024-06-10 DIAGNOSIS — I10 Essential (primary) hypertension: Secondary | ICD-10-CM

## 2024-06-11 ENCOUNTER — Telehealth: Payer: Self-pay

## 2024-06-11 ENCOUNTER — Other Ambulatory Visit: Payer: Self-pay | Admitting: Family

## 2024-06-11 NOTE — Telephone Encounter (Signed)
 Copied from CRM #[id number]. Topic: Clinical - Medication Question >> Jun 11, 2024 10:05 AM Sophia H wrote: Reason for CRM: Alternative for Mounjaro  due to cost for the patient - would like provider to consider either glipizide  or repaglimide.   [id number] - [place], [place] - [address] AT [address]

## 2024-06-14 ENCOUNTER — Other Ambulatory Visit: Payer: Self-pay

## 2024-06-14 MED ORDER — OMEPRAZOLE 40 MG PO CPDR: 40.0000 mg | DELAYED_RELEASE_CAPSULE | Freq: Every day | ORAL | 1 refills | Status: AC

## 2024-06-25 ENCOUNTER — Other Ambulatory Visit (HOSPITAL_COMMUNITY): Payer: Self-pay

## 2024-06-29 ENCOUNTER — Other Ambulatory Visit (HOSPITAL_COMMUNITY): Payer: Self-pay | Admitting: Specialist

## 2024-06-29 DIAGNOSIS — Z96651 Presence of right artificial knee joint: Secondary | ICD-10-CM

## 2024-07-01 ENCOUNTER — Encounter (HOSPITAL_COMMUNITY)

## 2024-07-01 ENCOUNTER — Encounter (HOSPITAL_COMMUNITY)
Admission: RE | Admit: 2024-07-01 | Discharge: 2024-07-01 | Disposition: A | Discharge status: Home / Self Care | Source: Ambulatory Visit | Attending: Specialist | Admitting: Specialist

## 2024-07-01 DIAGNOSIS — Z96651 Presence of right artificial knee joint: Secondary | ICD-10-CM

## 2024-07-01 MED ORDER — TECHNETIUM TC 99M MEDRONATE IV KIT
20.0000 | PACK | Freq: Once | INTRAVENOUS | Status: AC | PRN
  Administered 2024-07-01: 21.6 via INTRAVENOUS

## 2024-07-16 ENCOUNTER — Ambulatory Visit: Admitting: Family

## 2024-12-01 ENCOUNTER — Ambulatory Visit
